# Patient Record
Sex: Female | Born: 1970
Health system: Southern US, Community
[De-identification: ages and names within clinical notes are randomized; demographics above are authoritative.]

## PROBLEM LIST (undated history)

## (undated) DIAGNOSIS — C801 Malignant (primary) neoplasm, unspecified: Secondary | ICD-10-CM

## (undated) DIAGNOSIS — E282 Polycystic ovarian syndrome: Secondary | ICD-10-CM

## (undated) DIAGNOSIS — M797 Fibromyalgia: Secondary | ICD-10-CM

## (undated) DIAGNOSIS — K589 Irritable bowel syndrome without diarrhea: Secondary | ICD-10-CM

## (undated) DIAGNOSIS — M779 Enthesopathy, unspecified: Secondary | ICD-10-CM

## (undated) DIAGNOSIS — F32A Depression, unspecified: Secondary | ICD-10-CM

## (undated) DIAGNOSIS — M1990 Unspecified osteoarthritis, unspecified site: Secondary | ICD-10-CM

## (undated) DIAGNOSIS — I10 Essential (primary) hypertension: Secondary | ICD-10-CM

## (undated) DIAGNOSIS — K219 Gastro-esophageal reflux disease without esophagitis: Secondary | ICD-10-CM

## (undated) DIAGNOSIS — F329 Major depressive disorder, single episode, unspecified: Secondary | ICD-10-CM

## (undated) DIAGNOSIS — F419 Anxiety disorder, unspecified: Secondary | ICD-10-CM

## (undated) DIAGNOSIS — J189 Pneumonia, unspecified organism: Secondary | ICD-10-CM

## (undated) DIAGNOSIS — D126 Benign neoplasm of colon, unspecified: Secondary | ICD-10-CM

## (undated) HISTORY — DX: Depression, unspecified: F32.A

## (undated) HISTORY — DX: Fibromyalgia: M79.7

## (undated) HISTORY — DX: Polycystic ovarian syndrome: E28.2

## (undated) HISTORY — DX: Irritable bowel syndrome, unspecified: K58.9

## (undated) HISTORY — PX: NEUROPLASTY / TRANSPOSITION MEDIAN NERVE AT CARPAL TUNNEL BILATERAL: SUR894

## (undated) HISTORY — DX: Major depressive disorder, single episode, unspecified: F32.9

## (undated) HISTORY — DX: Unspecified osteoarthritis, unspecified site: M19.90

## (undated) HISTORY — DX: Enthesopathy, unspecified: M77.9

## (undated) HISTORY — DX: Anxiety disorder, unspecified: F41.9

## (undated) HISTORY — PX: APPENDECTOMY: SHX54

## (undated) HISTORY — DX: Benign neoplasm of colon, unspecified: D12.6

## (undated) HISTORY — PX: BACK SURGERY: SHX140

---
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
## 1997-04-28 ENCOUNTER — Encounter: Admission: RE | Admit: 1997-04-28 | Discharge: 1997-07-27 | Payer: Self-pay | Admitting: Orthopedic Surgery

## 1997-09-27 ENCOUNTER — Encounter: Admission: RE | Admit: 1997-09-27 | Discharge: 1997-09-27 | Payer: Self-pay | Admitting: Sports Medicine

## 1997-12-12 ENCOUNTER — Encounter: Admission: RE | Admit: 1997-12-12 | Discharge: 1997-12-12 | Payer: Self-pay | Admitting: Family Medicine

## 1998-01-09 ENCOUNTER — Encounter: Admission: RE | Admit: 1998-01-09 | Discharge: 1998-01-09 | Payer: Self-pay | Admitting: Family Medicine

## 1998-02-28 ENCOUNTER — Encounter: Admission: RE | Admit: 1998-02-28 | Discharge: 1998-02-28 | Payer: Self-pay | Admitting: Family Medicine

## 1998-02-28 ENCOUNTER — Other Ambulatory Visit: Admission: RE | Admit: 1998-02-28 | Discharge: 1998-02-28 | Payer: Self-pay | Admitting: Family Medicine

## 1998-03-27 ENCOUNTER — Encounter: Admission: RE | Admit: 1998-03-27 | Discharge: 1998-03-27 | Payer: Self-pay | Admitting: Family Medicine

## 1998-03-31 ENCOUNTER — Encounter: Admission: RE | Admit: 1998-03-31 | Discharge: 1998-03-31 | Payer: Self-pay | Admitting: Family Medicine

## 1998-05-08 ENCOUNTER — Encounter: Admission: RE | Admit: 1998-05-08 | Discharge: 1998-05-08 | Payer: Self-pay | Admitting: Family Medicine

## 1998-05-10 ENCOUNTER — Encounter: Admission: RE | Admit: 1998-05-10 | Discharge: 1998-05-10 | Payer: Self-pay | Admitting: Family Medicine

## 1998-05-12 ENCOUNTER — Encounter: Payer: Self-pay | Admitting: Emergency Medicine

## 1998-05-12 ENCOUNTER — Emergency Department (HOSPITAL_COMMUNITY): Admission: EM | Admit: 1998-05-12 | Discharge: 1998-05-12 | Payer: Self-pay | Admitting: Emergency Medicine

## 1998-05-25 ENCOUNTER — Encounter: Admission: RE | Admit: 1998-05-25 | Discharge: 1998-05-25 | Payer: Self-pay | Admitting: Obstetrics

## 1998-09-29 ENCOUNTER — Encounter (INDEPENDENT_AMBULATORY_CARE_PROVIDER_SITE_OTHER): Payer: Self-pay | Admitting: *Deleted

## 1998-09-29 LAB — CONVERTED CEMR LAB

## 1998-10-09 ENCOUNTER — Encounter: Admission: RE | Admit: 1998-10-09 | Discharge: 1998-10-09 | Payer: Self-pay | Admitting: Family Medicine

## 2001-04-24 ENCOUNTER — Encounter: Admission: RE | Admit: 2001-04-24 | Discharge: 2001-04-24 | Payer: Self-pay | Admitting: Family Medicine

## 2001-04-28 HISTORY — PX: ROTATOR CUFF REPAIR: SHX139

## 2002-09-24 ENCOUNTER — Encounter
Admission: RE | Admit: 2002-09-24 | Discharge: 2002-12-23 | Payer: Self-pay | Admitting: Physical Medicine & Rehabilitation

## 2002-12-27 ENCOUNTER — Encounter
Admission: RE | Admit: 2002-12-27 | Discharge: 2003-03-27 | Payer: Self-pay | Admitting: Physical Medicine & Rehabilitation

## 2003-03-30 ENCOUNTER — Encounter
Admission: RE | Admit: 2003-03-30 | Discharge: 2003-06-28 | Payer: Self-pay | Admitting: Physical Medicine & Rehabilitation

## 2003-08-04 ENCOUNTER — Encounter
Admission: RE | Admit: 2003-08-04 | Discharge: 2003-10-05 | Payer: Self-pay | Admitting: Physical Medicine & Rehabilitation

## 2003-10-05 ENCOUNTER — Encounter
Admission: RE | Admit: 2003-10-05 | Discharge: 2003-12-13 | Payer: Self-pay | Admitting: Physical Medicine & Rehabilitation

## 2003-10-07 ENCOUNTER — Ambulatory Visit: Payer: Self-pay | Admitting: Physical Medicine & Rehabilitation

## 2003-10-14 ENCOUNTER — Ambulatory Visit: Payer: Self-pay | Admitting: Psychology

## 2003-11-18 ENCOUNTER — Ambulatory Visit: Payer: Self-pay | Admitting: Psychology

## 2003-12-13 ENCOUNTER — Encounter
Admission: RE | Admit: 2003-12-13 | Discharge: 2004-02-09 | Payer: Self-pay | Admitting: Physical Medicine & Rehabilitation

## 2004-02-09 ENCOUNTER — Encounter
Admission: RE | Admit: 2004-02-09 | Discharge: 2004-05-09 | Payer: Self-pay | Admitting: Physical Medicine & Rehabilitation

## 2004-03-02 ENCOUNTER — Ambulatory Visit: Payer: Self-pay | Admitting: Physical Medicine & Rehabilitation

## 2004-04-26 ENCOUNTER — Ambulatory Visit: Payer: Self-pay | Admitting: Physical Medicine & Rehabilitation

## 2004-06-19 ENCOUNTER — Encounter
Admission: RE | Admit: 2004-06-19 | Discharge: 2004-09-17 | Payer: Self-pay | Admitting: Physical Medicine & Rehabilitation

## 2004-06-21 ENCOUNTER — Ambulatory Visit: Payer: Self-pay | Admitting: Physical Medicine & Rehabilitation

## 2004-08-08 ENCOUNTER — Ambulatory Visit: Payer: Self-pay | Admitting: Physical Medicine & Rehabilitation

## 2004-10-03 ENCOUNTER — Encounter
Admission: RE | Admit: 2004-10-03 | Discharge: 2005-01-01 | Payer: Self-pay | Admitting: Physical Medicine & Rehabilitation

## 2004-11-08 ENCOUNTER — Ambulatory Visit: Payer: Self-pay | Admitting: Physical Medicine & Rehabilitation

## 2005-01-02 ENCOUNTER — Encounter
Admission: RE | Admit: 2005-01-02 | Discharge: 2005-01-23 | Payer: Self-pay | Admitting: Physical Medicine & Rehabilitation

## 2005-01-11 ENCOUNTER — Ambulatory Visit: Payer: Self-pay | Admitting: Physical Medicine & Rehabilitation

## 2005-03-08 ENCOUNTER — Encounter
Admission: RE | Admit: 2005-03-08 | Discharge: 2005-06-06 | Payer: Self-pay | Admitting: Physical Medicine & Rehabilitation

## 2005-03-08 ENCOUNTER — Ambulatory Visit: Payer: Self-pay | Admitting: Physical Medicine & Rehabilitation

## 2005-05-02 ENCOUNTER — Ambulatory Visit: Payer: Self-pay | Admitting: Physical Medicine & Rehabilitation

## 2005-05-27 ENCOUNTER — Ambulatory Visit (HOSPITAL_COMMUNITY)
Admission: RE | Admit: 2005-05-27 | Discharge: 2005-05-27 | Payer: Self-pay | Admitting: Physical Medicine & Rehabilitation

## 2005-06-27 ENCOUNTER — Encounter
Admission: RE | Admit: 2005-06-27 | Discharge: 2005-09-25 | Payer: Self-pay | Admitting: Physical Medicine & Rehabilitation

## 2005-06-27 ENCOUNTER — Ambulatory Visit: Payer: Self-pay | Admitting: Physical Medicine & Rehabilitation

## 2005-09-24 ENCOUNTER — Ambulatory Visit: Payer: Self-pay | Admitting: Physical Medicine & Rehabilitation

## 2005-09-24 ENCOUNTER — Encounter
Admission: RE | Admit: 2005-09-24 | Discharge: 2005-12-23 | Payer: Self-pay | Admitting: Physical Medicine & Rehabilitation

## 2005-12-13 ENCOUNTER — Ambulatory Visit: Payer: Self-pay | Admitting: Physical Medicine & Rehabilitation

## 2006-01-28 HISTORY — PX: TRIGGER FINGER RELEASE: SHX641

## 2006-03-11 ENCOUNTER — Encounter
Admission: RE | Admit: 2006-03-11 | Discharge: 2006-06-09 | Payer: Self-pay | Admitting: Physical Medicine & Rehabilitation

## 2006-03-13 ENCOUNTER — Ambulatory Visit: Payer: Self-pay | Admitting: Physical Medicine & Rehabilitation

## 2006-03-28 ENCOUNTER — Encounter (INDEPENDENT_AMBULATORY_CARE_PROVIDER_SITE_OTHER): Payer: Self-pay | Admitting: *Deleted

## 2006-06-03 ENCOUNTER — Ambulatory Visit: Payer: Self-pay | Admitting: Physical Medicine & Rehabilitation

## 2006-06-19 ENCOUNTER — Ambulatory Visit (HOSPITAL_COMMUNITY): Admission: RE | Admit: 2006-06-19 | Discharge: 2006-06-19 | Payer: Self-pay | Admitting: Physician Assistant

## 2006-08-27 ENCOUNTER — Ambulatory Visit: Payer: Self-pay | Admitting: Physical Medicine & Rehabilitation

## 2006-08-27 ENCOUNTER — Encounter
Admission: RE | Admit: 2006-08-27 | Discharge: 2006-10-28 | Payer: Self-pay | Admitting: Physical Medicine & Rehabilitation

## 2006-11-26 ENCOUNTER — Encounter
Admission: RE | Admit: 2006-11-26 | Discharge: 2006-11-26 | Payer: Self-pay | Admitting: Physical Medicine & Rehabilitation

## 2006-11-26 ENCOUNTER — Ambulatory Visit: Payer: Self-pay | Admitting: Physical Medicine & Rehabilitation

## 2006-11-27 ENCOUNTER — Encounter
Admission: RE | Admit: 2006-11-27 | Discharge: 2007-02-25 | Payer: Self-pay | Admitting: Physical Medicine & Rehabilitation

## 2007-02-18 ENCOUNTER — Ambulatory Visit: Payer: Self-pay | Admitting: Physical Medicine & Rehabilitation

## 2007-02-18 ENCOUNTER — Encounter
Admission: RE | Admit: 2007-02-18 | Discharge: 2007-02-19 | Payer: Self-pay | Admitting: Physical Medicine & Rehabilitation

## 2007-05-13 ENCOUNTER — Encounter
Admission: RE | Admit: 2007-05-13 | Discharge: 2007-08-11 | Payer: Self-pay | Admitting: Physical Medicine & Rehabilitation

## 2007-05-18 ENCOUNTER — Ambulatory Visit: Payer: Self-pay | Admitting: Physical Medicine & Rehabilitation

## 2007-07-27 ENCOUNTER — Encounter: Payer: Self-pay | Admitting: Gastroenterology

## 2007-08-05 DIAGNOSIS — F450 Somatization disorder: Secondary | ICD-10-CM | POA: Insufficient documentation

## 2007-08-05 DIAGNOSIS — N979 Female infertility, unspecified: Secondary | ICD-10-CM | POA: Insufficient documentation

## 2007-08-05 DIAGNOSIS — G4700 Insomnia, unspecified: Secondary | ICD-10-CM | POA: Insufficient documentation

## 2007-08-05 DIAGNOSIS — IMO0002 Reserved for concepts with insufficient information to code with codable children: Secondary | ICD-10-CM

## 2007-08-05 DIAGNOSIS — IMO0001 Reserved for inherently not codable concepts without codable children: Secondary | ICD-10-CM

## 2007-08-05 DIAGNOSIS — M545 Low back pain, unspecified: Secondary | ICD-10-CM | POA: Insufficient documentation

## 2007-08-05 DIAGNOSIS — M1710 Unilateral primary osteoarthritis, unspecified knee: Secondary | ICD-10-CM

## 2007-08-05 DIAGNOSIS — N943 Premenstrual tension syndrome: Secondary | ICD-10-CM | POA: Insufficient documentation

## 2007-08-05 DIAGNOSIS — N83209 Unspecified ovarian cyst, unspecified side: Secondary | ICD-10-CM

## 2007-08-05 DIAGNOSIS — F419 Anxiety disorder, unspecified: Secondary | ICD-10-CM | POA: Insufficient documentation

## 2007-08-05 DIAGNOSIS — F32A Depression, unspecified: Secondary | ICD-10-CM | POA: Insufficient documentation

## 2007-08-05 DIAGNOSIS — R87619 Unspecified abnormal cytological findings in specimens from cervix uteri: Secondary | ICD-10-CM | POA: Insufficient documentation

## 2007-08-05 DIAGNOSIS — M25569 Pain in unspecified knee: Secondary | ICD-10-CM | POA: Insufficient documentation

## 2007-08-05 DIAGNOSIS — N949 Unspecified condition associated with female genital organs and menstrual cycle: Secondary | ICD-10-CM

## 2007-08-05 DIAGNOSIS — E669 Obesity, unspecified: Secondary | ICD-10-CM | POA: Insufficient documentation

## 2007-08-05 DIAGNOSIS — M1990 Unspecified osteoarthritis, unspecified site: Secondary | ICD-10-CM | POA: Insufficient documentation

## 2007-08-05 DIAGNOSIS — F411 Generalized anxiety disorder: Secondary | ICD-10-CM | POA: Insufficient documentation

## 2007-08-05 DIAGNOSIS — N938 Other specified abnormal uterine and vaginal bleeding: Secondary | ICD-10-CM | POA: Insufficient documentation

## 2007-08-06 ENCOUNTER — Ambulatory Visit: Payer: Self-pay | Admitting: Gastroenterology

## 2007-08-06 ENCOUNTER — Encounter
Admission: RE | Admit: 2007-08-06 | Discharge: 2007-08-07 | Payer: Self-pay | Admitting: Physical Medicine & Rehabilitation

## 2007-08-06 DIAGNOSIS — R1011 Right upper quadrant pain: Secondary | ICD-10-CM | POA: Insufficient documentation

## 2007-08-06 DIAGNOSIS — K589 Irritable bowel syndrome without diarrhea: Secondary | ICD-10-CM

## 2007-08-06 DIAGNOSIS — K219 Gastro-esophageal reflux disease without esophagitis: Secondary | ICD-10-CM | POA: Insufficient documentation

## 2007-08-06 LAB — CONVERTED CEMR LAB
Iron: 72 ug/dL (ref 42–145)
Vitamin B-12: 244 pg/mL (ref 211–911)

## 2007-08-07 ENCOUNTER — Ambulatory Visit (HOSPITAL_COMMUNITY): Admission: RE | Admit: 2007-08-07 | Discharge: 2007-08-07 | Payer: Self-pay | Admitting: Gastroenterology

## 2007-08-07 ENCOUNTER — Ambulatory Visit: Payer: Self-pay | Admitting: Physical Medicine & Rehabilitation

## 2007-08-19 ENCOUNTER — Encounter: Payer: Self-pay | Admitting: Gastroenterology

## 2007-08-19 ENCOUNTER — Ambulatory Visit: Payer: Self-pay | Admitting: Gastroenterology

## 2007-08-19 ENCOUNTER — Telehealth: Payer: Self-pay | Admitting: Gastroenterology

## 2007-08-29 ENCOUNTER — Encounter: Payer: Self-pay | Admitting: Gastroenterology

## 2007-10-29 ENCOUNTER — Encounter
Admission: RE | Admit: 2007-10-29 | Discharge: 2007-10-30 | Payer: Self-pay | Admitting: Physical Medicine & Rehabilitation

## 2007-10-30 ENCOUNTER — Ambulatory Visit: Payer: Self-pay | Admitting: Physical Medicine & Rehabilitation

## 2007-11-22 ENCOUNTER — Emergency Department (HOSPITAL_COMMUNITY): Admission: EM | Admit: 2007-11-22 | Discharge: 2007-11-22 | Payer: Self-pay | Admitting: Emergency Medicine

## 2008-01-29 HISTORY — PX: BACK SURGERY: SHX140

## 2008-02-18 ENCOUNTER — Encounter
Admission: RE | Admit: 2008-02-18 | Discharge: 2008-02-19 | Payer: Self-pay | Admitting: Physical Medicine & Rehabilitation

## 2008-02-19 ENCOUNTER — Ambulatory Visit: Payer: Self-pay | Admitting: Physical Medicine & Rehabilitation

## 2008-03-18 ENCOUNTER — Encounter: Admission: RE | Admit: 2008-03-18 | Discharge: 2008-03-22 | Payer: Self-pay | Admitting: Anesthesiology

## 2008-03-22 ENCOUNTER — Ambulatory Visit: Payer: Self-pay | Admitting: Anesthesiology

## 2008-03-22 ENCOUNTER — Ambulatory Visit (HOSPITAL_COMMUNITY)
Admission: RE | Admit: 2008-03-22 | Discharge: 2008-03-22 | Payer: Self-pay | Admitting: Physical Medicine & Rehabilitation

## 2008-07-05 ENCOUNTER — Encounter: Admission: RE | Admit: 2008-07-05 | Discharge: 2008-07-05 | Payer: Self-pay | Admitting: Orthopedic Surgery

## 2008-07-19 ENCOUNTER — Encounter: Admission: RE | Admit: 2008-07-19 | Discharge: 2008-07-19 | Payer: Self-pay | Admitting: Orthopedic Surgery

## 2008-08-31 ENCOUNTER — Encounter: Admission: RE | Admit: 2008-08-31 | Discharge: 2008-08-31 | Payer: Self-pay | Admitting: Orthopedic Surgery

## 2008-11-10 ENCOUNTER — Inpatient Hospital Stay (HOSPITAL_COMMUNITY): Admission: RE | Admit: 2008-11-10 | Discharge: 2008-11-14 | Payer: Self-pay | Admitting: Neurological Surgery

## 2009-02-20 ENCOUNTER — Ambulatory Visit (HOSPITAL_COMMUNITY): Admission: RE | Admit: 2009-02-20 | Discharge: 2009-02-20 | Payer: Self-pay | Admitting: Family Medicine

## 2010-02-09 ENCOUNTER — Encounter
Admission: RE | Admit: 2010-02-09 | Discharge: 2010-02-09 | Payer: Self-pay | Source: Home / Self Care | Attending: Orthopaedic Surgery | Admitting: Orthopaedic Surgery

## 2010-02-18 ENCOUNTER — Encounter: Payer: Self-pay | Admitting: Physical Medicine & Rehabilitation

## 2010-03-26 ENCOUNTER — Other Ambulatory Visit: Payer: Self-pay | Admitting: Orthopaedic Surgery

## 2010-03-26 DIAGNOSIS — M25511 Pain in right shoulder: Secondary | ICD-10-CM

## 2010-03-31 ENCOUNTER — Other Ambulatory Visit: Payer: Self-pay

## 2010-05-03 LAB — ABO/RH: ABO/RH(D): O POS

## 2010-05-03 LAB — CBC
HCT: 34.6 % — ABNORMAL LOW (ref 36.0–46.0)
HCT: 39.9 % (ref 36.0–46.0)
MCHC: 34.7 g/dL (ref 30.0–36.0)
MCV: 89.3 fL (ref 78.0–100.0)
Platelets: 270 10*3/uL (ref 150–400)
Platelets: 334 10*3/uL (ref 150–400)
RBC: 3.84 MIL/uL — ABNORMAL LOW (ref 3.87–5.11)
RBC: 4.47 MIL/uL (ref 3.87–5.11)

## 2010-05-03 LAB — BASIC METABOLIC PANEL
BUN: 3 mg/dL — ABNORMAL LOW (ref 6–23)
CO2: 27 mEq/L (ref 19–32)
GFR calc Af Amer: >60 mL/min (ref >60)
Sodium: 135 mEq/L (ref 135–145)

## 2010-05-03 LAB — TYPE AND SCREEN: Antibody Screen: NEGATIVE

## 2010-06-12 NOTE — Assessment & Plan Note (Signed)
Ms. Ramdass returns to clinic today for followup evaluation.  She  reports that overall she is having more pain with the colder weather  lately.  She has been told that she has arthritis of the TMJ joint.  She  does request an elbow injection on the right side.  She last had an  injection in February of 2008 and that helped.  She does report  occasionally dropping things, but does also have a history of bilateral  carpal tunnel syndrome with surgery.  She does need a refill on her  oxycodone in the office today.  She continues to get a fair amount of  relief with that used approximately 5-6 tablets per day.   MEDICATIONS:  1. Celebrex 200 mg daily.  2. Cymbalta 120 mg daily.  3. Soma 350 mg b.i.d.  4. Prilosec p.r.n.  5. Flonase p.r.n.  6. Oxycodone 5 mg 1 __________ tablets p.o. t.i.d. p.r.n. (5-6 per      day).  7. Bentyl daily.   REVIEW OF SYSTEMS:  Noncontributory.   PHYSICAL EXAMINATION:  Markedly overweight adult female in mild acute  discomfort.  Blood pressure 123/78 with a pulse of 88, respiratory rate 18, and O2  saturation 99% on room air.  She has 4+/5 strength throughout the bilateral upper and lower  extremities.   IMPRESSION:  1. Probable fibromyalgia.  2. Chronic low back pain with bilateral knee pain.  3. Bilateral knee osteoarthritis.  4. Mild degenerative disk disease of the lumbar spine.  5. Mild degenerative disease of the right ankle.   In the office today we did refill the patient's oxycodone as noted  above.  We also had her sign a consent for right elbow injection.  I  prepared 3 cc syringe with 1 cc of Kenalog 40 and 2 cc of lidocaine 1%.  I cleansed the right elbow and injected posterolaterally approximately 1  cm deep.  The patient tolerated the procedure well and good hemostasis  was obtained at the injection site.   We will plan on seeing the patient in followup in approximately 3  months' time with refills prior to that appointment as  necessary.  I  also gave her samples of ketorolac gel 1% to be applied to the right  temporomandibular joint region to see if we can help with some of the  arthritic pain that she has in that joint.  She already takes Celebrex,  and we are avoiding any other antiinflammatory medications orally.  Will  plan on seeing the patient in followup as noted above.           ______________________________  Ellwood Dense, M.D.     DC/MedQ  D:  11/27/2006 10:30:43  T:  11/27/2006 17:08:06  Job #:  [id number]

## 2010-06-12 NOTE — Assessment & Plan Note (Signed)
[name] returns to the clinic today for followup evaluation.  She  reports that she did get good relief from the Voltaren gel that she  applied to her foot and to her TMJ joint bilaterally.  She would like to  have samples of those as she cannot afford them, and they are not  covered by her insurance.  Furthermore lidocaine patches are not covered  by her insurance, and she gained relief with those when she applied them  to her foot in a path.  She does have increased pain in her right SI  joint region.  She wonders about the lidocaine patch to that area.  She  continues to plan to quit tobacco, but still smokes 1/2 pack per day.  The PA at her primary care physician's office has started her on  Wellbutrin in addition to her Cymbalta.  She does need a refill on her  oxycodone and Soma in the office today.   MEDICATIONS:  1. Celebrex 200 mg daily.  2. Cymbalta 120 mg daily.  3. Soma 350 mg b.i.d.  4. Prilosec p.r.n.  5. Flonase p.r.n.  6. Oxycodone 5 mg 1-2 tablets p.o. t.i.d. p.r.n. (5-6 per day).  7. Bentyl daily.  8. Wellbutrin daily.   REVIEW OF SYSTEMS:  Positive for weight gain, night sweats,  constipation, diarrhea, abdominal pain.   PHYSICAL EXAMINATION:  GENERAL:  Overweight adult female in mild to  moderate acute discomfort.  VITAL SIGNS:  Blood pressure 124/49 with pulse 100, respiratory rate 20,  O2 saturation 99% on room air.  MUSCULOSKELETAL:  She has 4+/5 strength throughout the bilateral upper  and lower extremities.  She does complain of pain in her right SI joint  region along with milder pain in her right groin region.   IMPRESSION:  1. Probable fibromyalgia.  2. Chronic low back and bilateral knee pain.  3. Bilateral knee osteoarthritis.  4. Mild degenerative disk disease of the lumbar spine.  5. Mild degenerative disease of the right ankle.   In the office today we did refill the patient's Soma and oxycodone.  We  also gave her samples of lidocaine  patches to be applied to right SI  joint and also samples of Voltaren gel 1% to be applied to her TMJ  joints and her right ankle.  Will plan on seeing the patient in followup  in this office in approximately three months' time with refills prior to  that appointment if necessary.           ______________________________  Ellwood Dense, M.D.     DC/MedQ  D:  02/19/2007 11:43:54  T:  02/19/2007 12:33:48  Job #:  [id number]

## 2010-06-12 NOTE — Assessment & Plan Note (Signed)
HISTORY OF PRESENT ILLNESS:  Ms. Kruger returns for follow-up  evaluation.  She has numerous questions in the office today.  Some of  those are related to her recent evaluation with Dr. Stanford Scotland, her  orthopedist in [facility].  She had x-rays of her right ankle done which  showed an enlarged tuberosity of the tarsal navicular area.  It was felt  that there were some degenerative changes in that area.  Dr. Stanford Scotland  suggested the patient increase her Celebrex up to daily.  She does need  a refill on that along with the Soma in the office today.  She also  needs a refill on her oxycodone over the next few days.  She reports  that Dr. Stanford Scotland wanted to have her obtain orthotics costing  approximately $317 through their office.  She reports that she could not  afford that.  She plans to try over-the-counter orthotics through her  local pharmacy.  We did suggest the use of Lidoderm patches and gave her  samples in the office today.  She does report night sweats and dry mouth  and would like to try the lidocaine patch before we add any extra  medication for her at this time.   MEDICATIONS:  1. Celebrex 200 mg 1 tablet daily.  2. Cymbalta 120 mg daily.  3. Soma 350 mg q.h.s.  4. Prilosec p.r.n.  5. Flonase p.r.n.  6. Oxycodone 5 mg 1-2 tablets p.o. t.i.d. p.r.n. (5 to 6 per day.)   REVIEW OF SYSTEMS:  Positive for night sweats, weight loss,  constipation, diarrhea, vomiting, nausea, abdominal pain, limb swelling.   PHYSICAL EXAMINATION:  GENERAL APPEARANCE:  Well-appearing, moderately  overweight, adult female in mild acute discomfort.  VITAL SIGNS:  Blood pressure 132/73, pulse 81, respiratory rate 16 and  O2 saturation 99% on room air.  EXTREMITIES:  She has pain involving the middle aspect of her right foot  in the medial area.   IMPRESSION:  1. Probable fibromyalgia.  2. Chronic low back pain with bilateral knee pain.  3. Bilateral knee osteoarthritis.  4. Mild degenerative  disk disease of the lumbar spine.  5. Mild degenerative disease of the right ankle.   PLAN:  In the office today, we did refill the patient's Soma along with  her Celebrex and changed that to daily dosing.  We also gave her samples  of Lidoderm patches in the office today to be used on her right ankle  q.h.s. through q.a.m.  We refilled the oxycodone as of Jun 07, 2006.  Will plan on seeing her in follow up in approximately two to three  months with refills prior to that appointment as necessary.           ______________________________  Ellwood Dense, M.D.     DC/MedQ  D:  06/05/2006 12:33:37  T:  06/05/2006 14:05:53  Job #:  [id number]

## 2010-06-12 NOTE — Assessment & Plan Note (Signed)
SUBJECTIVE:  Erica Mcdaniel is seen in the clinic today for followup.  She  did not come in for her refills as allowed.  She apparently is having  problems affording the gas and coming in from the distance that she  lives away.  She does need refills on her Celebrex and Oxycodone.  She  has been using her Tresa Garter generally twice a day and getting better relief  than when she was using it once a day.  She does report that the  Lidoderm patches have helped with her right ankle pain.  That has  improved.  She has lost 15 pounds and is down to 242 doing a lot of  swimming and walking.  The patient does report that off the pain  medication she notices substantially increased pain and now is convinced  that she has to have the medications as prescribed.   MEDICATIONS:  1. Celebrex 200 mg daily.  2. Cymbalta 120 mg daily.  3. Soma 350 mg b.i.d.  4. Prilosec p.r.n.  5. Flonase p.r.n.  6. Oxycodone 5 mg 1-2 tablets p.o. t.i.d. p.r.n. (5-6 per day).   REVIEW OF SYSTEMS:  Positive for weight loss, night sweats, diarrhea,  constipation, abdominal pain, nausea.   PHYSICAL EXAMINATION:  GENERAL:  Well-appearing, moderately overweight  adult female in moderate acute discomfort.  VITAL SIGNS:  Blood pressure was 147/81 with pulse 97, respiratory rate  18, O2 saturation 99% on room air.  NEUROLOGICAL:  She has 4+/5 strength throughout the bilateral upper and  lower extremities.   IMPRESSION:  1. Probable fibromyalgia.  2. Chronic low back pain with bilateral knee pain.  3. Bilateral knee osteoarthritis.  4. Mild degenerative disk disease of the lumbar spine.  5. Mild degenerative disease of the right ankle.   PLAN:  In the office today, we did refill the patient's Celebrex and  Oxycodone as of today.  We also refilled her Somat at the b.i.d. dosing  as of today.  We will plan on seeing her in followup in approximately  three months time.  She will have her father come in to pick up scripts  for  her as he is able to afford the gas for transportation at this time.           ______________________________  Ellwood Dense, M.D.     DC/MedQ  D:  08/28/2006 10:48:39  T:  08/28/2006 17:05:29  Job #:  [id number]

## 2010-06-12 NOTE — Assessment & Plan Note (Signed)
Erica Mcdaniel returns to the clinic today for followup evaluation.  She  did undergo colonoscopy and endoscopy on August 19, 2007 a by [facility] GI  physician.  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  She reports that she was  negative for celiac disease.  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  She was placed on MiraLax twice a  day, which is only minimally helpful.  She is also started on Align,  which is apparently a probiotic.   The patient was also referred to Dr. Corliss Skains, local rheumatologist.  They advised her to change her diet slightly and did a lot of blood  work, which she is due to follow up for on November 19, 2007.  She was  also told that she has fatty liver and physicians have suggested  possibly gastric bypass.  Overall without surgery, she has lost 7  pounds.  She does take the oxycodone approximately 5-6 tablets per day  and does need a refill.  She feels that she gets benefit from the [place],  which she feels helps with spasms of her intestines.  She also has the  new brace for left foot and is getting shoe inserts for heel spurs.  She  requests samples of Lidoderm patch.   MEDICATIONS:  1. Celebrex 200 mg daily.  2. Cymbalta 120 mg daily.  3. Soma 350 mg t.i.d. p.r.n.  4. Prilosec p.r.n.  5. Flonase p.r.n.  6. Align daily.  7. Oxycodone 5 mg 1-2 tablets t.i.d. p.r.n. (5-6 per day).  8. Bentyl daily.  9. Wellbutrin daily.  10.Voltaren gel p.r.n.  11.Lidocaine patches 5% daily p.r.n.Marland Kitchen   REVIEW OF SYSTEMS:  Positive for nausea, vomit, diarrhea, colonoscopy,  and abdominal pain.   PHYSICAL EXAMINATION:  GENERAL:  Moderately over weight adult female in  mild-to-no acute discomfort.  VITAL SIGNS:  Blood pressure 133/89 with pulse of 74, respiratory rate  18, and O2 saturation 99% on room air.  EXTREMITIES:  She has 4+/5 strength throughout the bilateral upper and  lower  extremities.  She ambulates without any assistive device.   IMPRESSION:  1. Probable fibromyalgia.  2. Chronic low back and bilateral knee pain.  3. Bilateral knee osteoarthritis.  4. Probable degenerative joint disease of the medial malleolus.  5. Mild degenerative disk disease of the lumbar spine.  6. Chronic abdominal pain.   In the office today, we did refill the patient's oxycodone as of November 07, 2007.  She has a sufficient supply of Soma and Celebrex at this  time.  Also gave her samples of lidocaine patches in the office today  5%.  We will plan on seeing her in followup in approximately 3 months'  time with refills prior to that appointment as necessary.           ______________________________  Ellwood Dense, M.D.     DC/MedQ  D:  10/30/2007 10:49:11  T:  10/30/2007 23:21:04  Job #:  [id number]

## 2010-06-12 NOTE — Assessment & Plan Note (Signed)
Ms. Whitelaw returns to clinic today for followup evaluation.  She  reports that she continues to get good benefit from the Voltaren gel  applied to her right foot in the TMJ joint.  She does use it bilaterally  but reports more pain on the right side.  She has an ankle-foot orthosis  prescribed by her orthopedist Dr. Stanford Scotland, but she is asking about  another brace.  She has apparently been told that she has degenerative  joint disease in the medial malleolus of the right side.  That will  probably become more and more symptomatic with prolonged weightbearing.  Patient does report that she is in the process of trying to stop  smoking.  She is on Wellbutrin and is down to 10 cigarettes per day.  Despite smoking cessation efforts, she has had ability to lose 7 pounds  and tries to walk on a regular basis.  She does need a refill on her  oxycodone and her Tresa Garter in the office today.  She also asked for repeat  samples of the Voltaren gel.   MEDICATIONS:  1. Celebrex 200 mg q. day.  2. Cymbalta 120 mg q. day.  3. Soma 350 mg b.i.d.  4. Prilosec p.r.n.  5. Flonase p.r.n.  6. Oxycodone 5 mg 1-2 tablets t.i.d. p.r.n. (5-6 per day).  7. Bentyl daily.  8. Wellbutrin daily.  9. Voltaren gel p.r.n.   REVIEW OF SYSTEMS:  Positive for weight loss, night sweats, nausea,  vomiting, diarrhea, constipation, abdominal pain, and coughing.   PHYSICAL EXAMINATION:  Mildly to moderately overweight adult female in  mild acute discomfort.  Blood pressure 146/86 with a pulse of 84, respiratory rate 18 and O2  saturation 97% on room air.  The patient has 4+/5 strength throughout the bilateral upper and lower  extremities.  She does complain of pain in the medial malleolus,  especially of her right ankle.   IMPRESSION:  1. Probable fibromyalgia.  2. Chronic low back and bilateral knee pain.  3. Bilateral knee osteoarthritis.  4. Probably degenerative joint disease of the medial malleolus.  5. Mild  degenerative disk disease of the lumbar spine.   In the office today, we did contact [employer] and we have set the patient  up to receive an air-filled stirrup brace for her ankle.  She can go  over there and get that supplied today.  We also refilled her Tresa Garter and  her oxycodone as of the 25th of April and gave her samples of the  Voltaren gel in the office today.  We will plan on seeing her in  followup in approximately 3 months' time.           ______________________________  Ellwood Dense, M.D.     DC/MedQ  D:  05/18/2007 10:57:27  T:  05/18/2007 11:55:59  Job #:  [id number]

## 2010-06-12 NOTE — Assessment & Plan Note (Signed)
Erica Mcdaniel returns to clinic today for followup evaluation.  I last  saw her in this office October 30, 2007.  We have tried to get her in  twice for followup visits for her acute increased shoulder pain and hip  pain.  Unfortunately, she was unable to keep either of those  appointments, one because of sickness on her part with respiratory  infection and the other time because of weather difficulties.   The patient reports that she developed severe bruising and pain of her  left shoulder approximately mid October 2009.  She reports that she  initially was seen in the emergency room and was given a prescription  for pain medicines, but she did not take those medicines.  She reports  that x-rays were done along with blood draws and they released her.  She  subsequently saw Dr. Anna Genre at [facility] who was her family  physician.  They did an ultrasound, November 24, 2007, and did not find  any blood clots.  She subsequently saw Dr. Stanford Scotland, orthopedist at  [facility].  They eventually did an MRI scan of her left shoulder, which  reportedly showed tearing of the deltoid muscle.  She denies any  specific trauma.  They had no surgical intervention that they  recommended and felt that it would improve with time.  She reports that  since that time the pain in the shoulder has actually worsened but the  bruising has improved.  She has not been referred for any therapies and  has been prescribed a sling, but no exercise program for her shoulder.   A separate issue is involving her left hip where she complains of pain  from the posterior to the anterior surface of her left buttock.  She  reports that she has had plain films of her right hip in the past but  never of her left hip.  She does remember a time when she slept on ice  recently, but did not actually fall, but did jolt her when she nearly  fell.   The patient reports that she is not getting much relief from the Soma at  the  present time.  She does get reasonable relief from the oxycodone 5  mg, but is actually increased up to 8 per day at present.   REVIEW OF SYSTEMS:  Positive for weight loss, night sweats, diarrhea,  abdominal pain, and constipation.   MEDICATIONS:  1. Celebrex 200 mg daily.  2. Cymbalta 120 mg daily.  3. Soma 350 mg t.i.d. p.r.n.  4. Prilosec p.r.n.  5. Flonase p.r.n.  6. Oxycodone 5 mg 1-2 tablets p.o. t.i.d. p.r.n. (7-8 per day).  7. Bentyl daily.  8. Wellbutrin daily.  9. Voltaren gel p.r.n.  10.Lidocaine patches p.r.n.   The patient reports the pain is interfering with sleeping, standing,  walking, lifting, and traveling along with household management.   PHYSICAL EXAMINATION:  Moderately overweight adult female in mild  discomfort involving her left hip and left shoulder.  She does have  minimal bruising on the left shoulder with slightly prominent deltoid  vasculature.  She ambulates without any assistive device, but does  complain of pain involving her left hip.  Blood pressure is 140/90 with  pulse of 95, respiratory rate 18, and O2 saturation is 99% on room air.  She generally has 4+5-/5 strength throughout.   IMPRESSION:  1. Probable fibromyalgia.  2. Chronic low back and bilateral knee pain.  3. Bilateral knee osteoarthritis.  4.  Acute left shoulder pain.  5. Acute left hip pain.  6. Chronic abdominal pain.   In the office today, we did send the patient to outpatient therapies at  [facility] to be instructed in home exercise program for the left  shoulder.  We also requested 2-views of her left hip to evaluate for  acute pain and rule out occult fracture.  We substituted Robaxin for the  Soma and we will use the Robaxin at 500 mg t.i.d. p.r.n. and stop the  Soma completely.  We increased her oxycodone up to maximum of 8 tablets  per day from prior 6 tablets per day as of February 25, 2008.  We will  plan on seeing her in followup in approximately 1 month's  time.           ______________________________  Ellwood Dense, M.D.     DC/MedQ  D:  02/19/2008 11:04:04  T:  02/19/2008 23:33:39  Job #:  [id number]

## 2010-06-12 NOTE — Assessment & Plan Note (Signed)
Erica Mcdaniel returns to clinic today for followup evaluation.  She  reports that she has been experiencing a lot of abdominal pain and  aches.  She has been told she may have irritable bowel syndrome possibly  reflux disease with possible gallbladder disease and/or liver disease.  She does report a recent blood test was positive for celiac disease.  She reports that Dr. Jarold Mcdaniel, her GI doctor is planning an endoscopy  and colonoscopy, August 19, 2007.  She reports that other blood work was  recently done for vitamin levels.   In terms of her pain medicines, she is using Celebrex 200 mg daily along  with Soma generally 3 times per day at present.  She also uses oxycodone  anywhere from 5-6 tablets per day.  Her other medicines have been  unchanged.  She does need refills on her pain medicines in the office  today.   MEDICATIONS:  1. Celebrex 200 mg daily.  2. Cymbalta 120 mg daily.  3. Soma 350 mg t.i.d.  4. Prilosec p.r.n.  5. Flonase p.r.n.  6. Oxycodone 5 mg 1-2 tablets t.i.d. p.r.n. (5-6 per day).  7. Bentyl daily.  8. Wellbutrin daily.  9. Voltaren Gel p.r.n.   REVIEW OF SYSTEMS:  Positive for weight loss, skin rash, nausea,  vomiting, diarrhea, constipation, abdominal pain, and limb swelling.   PHYSICAL EXAMINATION:  GENERAL:  Moderately overweight adult female in  moderate-acute discomfort involving the right upper abdomen.  VITAL SIGNS:  Blood pressure is 137/80 with a pulse of 60, and O2  saturation 99% on room air.  She has 4+/5 strength throughout the  bilateral upper and lower extremities.   IMPRESSION:  1. Probable fibromyalgia.  2. Chronic low back and bilateral knee pain.  3. Bilateral knee osteoarthritis.  4. Probable degenerative joint disease of the medial malleolus.  5. Mild degenerative disk disease of the lumbar spine.  6. Chronic abdominal pain.   In the office today, we did refill the patients' oxycodone along with  her Celebrex, and her Soma.  We  gave her refills on Celebrex and Soma.  I will wait to see the results of the endoscopy and colonoscopy, but I  would not be surprised if she has some gallbladder disease according to  her reports about symptoms being worse with fatty foods.  We will plan  on seeing her in follow up at approximately 3 months' time with refills  prior to that appointment as necessary.           ______________________________  Ellwood Dense, M.D.     DC/MedQ  D:  08/07/2007 10:59:01  T:  08/08/2007 09:26:03  Job #:  [id number]

## 2010-06-12 NOTE — Assessment & Plan Note (Signed)
PATIENT:  Erica Mcdaniel.   DATE OF BIRTH:  25-Jul-1970.   SURGEON:  Jewel Baize. Stevphen Rochester, MD   Erica [facility]cd[facility] and I  evaluated her.  I have reviewed the Health and History form.  I reviewed  the 14-point review of systems.   1. Reviewed progress to date, overall directed care approach.  Spent a      considerable period of time with background checks, when reviewing      the med profile, of note, there is a gap with the central check and      we are going to have to run down the individual pharmacy basically      from February to August.  She was, however, consistent throughout      at 180 tablets 30 days supply.  2. Full informed consent, UDS.  3. Discussed modifiable features in health profile, reviewed      medication profile, and reviewed legitimate need.  She has      extensive lumbar sacral disease and fibromyalgia, and is taking      oxycodone, up to 7 or 8 a day.  I counseled.  I am also concerned      about potential for acetaminophen exposure, and reviewed with her.      She will also maintain contact with primary care.  The patient was      instructed to bring her pill bottles in and on each visit, and      review pollices of the pain center here.  We also reviewed      adjuncts.   During the course of the interview, it became apparent that Erica Mcdaniel  regularly used illegal drugs, particularly marijuana, and she felt that  was okay as it was apparently validated either by her treating  physician, or by another.  This is unclear.  When I reviewed the opioid  consent and patient care agreement, I relate to her, that I am unable to  prescribe pain medication, or proceed further in light of this  information.  I offered detoxification, pretty much just starting over.  She had some fibromyalgia symptoms, and she has some pain that we would  consider from a degenerative source, but her underlying diagnoses have  been  probable fibromyalgia, osteoarthritis, and back and hip pain.  I  actually been reviewed some films today of the hip and it looks pretty  normal, with modest L5-S1 disease.  It is my impression that if we just  start over, at baseline, see what her to do her baseline pain is,  possibly offer interventional approach as another option as an adjunct,  we could probably move away from these regular prescriptions of fairly  high-dosed oxycodone.   She then relates to me that it is unfair and she is being singled out,  when in fact that is not the case.  We have been regularly addressing  illicit drug use in this clinic, and followup policy, and we did a peer  check today, confirmed.  We also note that the patient was told this  with her best interest in mind, but did not want to hear other options  available to her.  We would be happy to refer her on in the like, but we  never really got that far.  She got up and left.  She prior to that  noted that there is a note on the wall that we have  the option of drug  screen, and she told me that I could exercise that option and just not  do it.  She stated that she felt like a drug addict, and implicated  nursing and not to me at face, but I reassure her that ongoing adherence  monitoring is a necessary part of controlled substance management and  pain clinic procedure.   She leaves, only to return and is disruptive in the waiting room, and we  took her back to the [employer] office.  At least 45 minutes of  counseling and reassurance to the patient, with options given, and we  again have a peer meeting.  Dr. Wynn Banker, director of the clinic, and  myself agree that we are unable to continue treating her in light of the  fact that her regular use of illegal drugs, but we would give her the  opportunity for detox and then rethink if compliance could be obtained.  We want to give her the opportunity to continue treatment in some area,  and this will be  the policy here.   OBJECTIVE:  I examined her to find mostly myofascial mechanical pain,  suprascapular, levator scapular, and paralumbar discomfort, some  distractibility.  She had intact neurological assessment, her joints  appear to be normal, but some pain with extension and side bending.  Mixed spondylitic myofascial pain.   IMPRESSION:  Myofascial pain.  Mild degenerative disease of the lower  lumbar spine.   PROCEDURE:   PLAN:  We will refer on to detox if she would like, to help with moving  away from oxycodone, moving away from illegal drugs, and look forward to  other treatment options, and have her consider other treatment options,  at this time we cannot offer in this office.  We would be happy to refer  on.            ______________________________  Celene Kras, MD     HH/MedQ  D:  03/22/2008 16:03:16  T:  03/23/2008 03:27:58  Job #:  [id number]

## 2010-06-15 NOTE — Assessment & Plan Note (Signed)
MEDICAL RECORD NUMBER:  [id number].   [name] returns to clinic today for followup evaluation. She reports that  she has been treated for pneumonia for approximately four weeks now. She has  also developed upper right abdominal pain which apparently is being worked  up as possible gallbladder disease. She had an ultrasound which reported  showed her gallbladder to be normal, but she is in the process of being sent  for CAT scan of her abdomen with contrast to evaluate her gallbladder.  Apparently, some gallbladder or liver function tests were slightly elevated  when they drawn by her primary care physician. She also has reportedly some  elevated cardiac enzymes, but she is not sure exactly what they are.  Apparently, her primary care physician, Dr. Anna Genre, plans to forward those  to this office. He is associated with [facility]. She has also  recently had a cholesterol level come back 208.   The patient has a fair amount of stress in her life as two stepsons has  recently moved in with her and her husband along with their 47-year-old  child. She is also followed by  _____________ mental health and was recently  switched from Effexor to Cymbalta at 30 mg for the past week. She was also  started on Ativan at a low dose twice a day as needed.   The patient does need a refill on her Celebrex and oxycodone medications in  the office today.   MEDICATIONS:  1.  Celebrex 200 mg 1 tablet every other day.  2.  Cymbalta 30 mg q.d.  3.  Soma 350 mg 1 tablet q.8h. p.r.n.  4.  Prilosec p.r.n.  5.  Flonase p.r.n.  6.  Oxycodone 5 mg 1 to 2 tablets b.i.d. p.r.n. (approximately 4 per day).   PHYSICAL EXAMINATION:  Reasonably well appearing adult female complaining of  pain of her right upper abdomen. She reports that this pain is worse  whenever she tries to eat much. Blood pressure was 130/77 with a pulse of  82, respiratory rate 16, and O2 saturation of 98% on room air. She has 5/5   strength throughout the bilateral upper and lower extremities. Bulk and tone  were normal. Reflexes were 2+ and symmetrical. There was no rebound  tenderness although there was pain with mild palpation of the right upper  quadrant.   IMPRESSION:  1.  Chronic back pain and bilateral knee pain with a history of multiple      joint surgeries.  2.  Osteoarthritis of the bilateral knees.  3.  Mild degenerative disk disease of the lumbar spine.   In the office today, we did refill her oxycodone and Celebrex medications.  Her primary care physician, Dr. Anna Genre, apparently will be forwarding [place] lab  tests from this patient. She is also scheduled for the CAT scan with  contrast to evaluate her gallbladder which sounds like it has some disease  present. Will plan on seeing the patient in followup in this office in  approximately one to two months' time with refills of medication prior to  that appointment as necessary. If there are any significant abnormalities on  the blood test once they are sent to me, I will call the patient, and we  will make adjustments in medicines as necessary.    DC/MedQ  D:  04/26/2004 11:20:38  T:  04/26/2004 12:55:22  Job #:  [id number]

## 2010-06-15 NOTE — Assessment & Plan Note (Signed)
HISTORY OF PRESENT ILLNESS:  Mrs.  Erica Mcdaniel returns to the clinic today for  follow up evaluation.  She has missed at least one, if not two, appointments  secondary to her child being ill.  We did bridge her on the oxycodone, but  unfortunately did not bridge her on the Duragesic patch.  She does have some  increased pain as she has not had any of the Duragesic patch for the past  few days.  We need to refill both of those in the office today.  She has  lost 7 pounds since last clinic visit.  She has quit drinking [facility]  and has tried to do extra walking on a daily basis.   MEDICATIONS:  1.  Celebrex 200 mg q.i.d.  2.  Cymbalta 30 mg daily.  3.  Soma 350 mg nightly.  4.  Prilosec p.r.n.  5.  Flonase p.r.n.  6.  Oxycodone 5 mg 1-2 tablets p.o. b.i.d. p.r.n. (2-4 per day).  7.  Duragesic patch 25 mcg per hour, change q.72 h.   REVIEW OF SYSTEMS:  Noncontributory.   PHYSICAL EXAMINATION:  GENERAL APPEARANCE:  Reasonably well appearing,  slightly overweight adult female in mild acute discomfort.  VITAL SIGNS:  Blood pressure and vitals not obtained.  NEUROLOGICAL:  She has 5+/5 strength throughout the bilateral upper and  lower extremities.  She has slight pain deep in her right pelvis region.  She does have a history of MRI scan showing polycystic ovarian disease, and  that is followed by her OB/GYN.  No abnormalities at the hip joint were  identified during that study.   IMPRESSION:  1.  Chronic low back pain with bilateral knee pain and history of multiple      joint surgeries.  2.  Bilateral knee osteoarthritis.  3.  Mild degenerative disk disease of the lumbar spine.  4.  Probable fibromyalgia.   PLAN:  In the office today, we did refill the patient's oxycodone and  Duragesic as noted above.  We will plan on seeing the patient in follow up  in this office in approximately two months time with refill on medication  prior to that appointment as necessary.            ______________________________  Ellwood Dense, M.D.     DC/MedQ  D:  01/11/2005 09:58:12  T:  01/11/2005 13:47:04  Job #:  [id number]

## 2010-06-15 NOTE — Assessment & Plan Note (Signed)
HISTORY OF PRESENT ILLNESS:  [name] returns to the clinic today for  follow up evaluation.  She is very happy that she is weighing 207 pounds at  present.  She had been up to 242 pounds and has lost approximately 38  pounds.  She has been doing some pool exercises when she takes her son to  the pool and also has been trying to watch her weight lately.  She has  noticed that her blood pressure is under better control and that she is  feeling some increased energy.  The patient continues to use her Oxycodone 5  mg one tablet q.8h.  She has recently switched to 2 tablets twice a day, and  that seems to help her more than one tablet q.8h.  She continues to take 1/2  tablet of Robaxin on a three time a day basis.  She also uses her Celebrex  every other day at the present time.  The patient has been back to see her  gynecologist and an ultrasound was done.  Did not plan any particular  treatment, but the patient has been started on Bentyl for irritable bowel  syndrome.   The patient did have a recent right middle finger operation with Dr.  Stanford Scotland.  That was for a cyst removal.  She did use some extra Oxycodone  initially after that surgery.   MEDICATIONS:  1. Celebrex 200 mg one tablet q.o.d.  2. Effexor XR two tablets daily.  3. Soma 350 mg one tablet q.8h. p.r.n.  4. Prilosec p.r.n.  5. Flonase p.r.n.  6. Oxycodone two tablets p.o. b.i.d. p.r.n.   PHYSICAL EXAMINATION:  GENERAL APPEARANCE:  Well-appearing, well-nourished  adult female.  VITAL SIGNS:  Blood pressure 120/66 with a pulse of 73, respiratory rate 14,  O2 saturation 98% on room air.  EXTREMITIES:  She has 4+-5-/5 strength throughout the bilateral upper and  lower extremities.  Bulk and tone were normal and reflexes were 2+ and  symmetrical.  She ambulates without any assisted device.   IMPRESSION:  1. Chronic back and bilateral knee pain with history of multiple joint     surgeries.  2. Mild degenerative disk  disease of the spine per MRI study December 2004.   PLAN:  In the clinic today, I did refill the patient's Celebrex medications.  We have also refilled the Oxycodone, and she will continue taking 5 mg two  tablets p.o. b.i.d.  This will be an increase from the three tablets that  she has been taking daily.  With that in place, we have increased the number  of tablets to 120 tablets which should cover her for one month's time.  We  plan on seeing her in follow up in this office in approximately two months  with refills prior to that appointment.  She will continue on her Robaxin  medication as noted above.      Ellwood Dense, M.D.   DC/MedQ  D:  08/08/2003 11:27:50  T:  08/08/2003 11:52:53  Job #:  [id number]

## 2010-06-15 NOTE — Assessment & Plan Note (Signed)
HISTORY OF PRESENT ILLNESS:  [name] returns today for follow up  evaluation.  She reports that she has been experiencing a lot of low back  pain, especially around the holidays.  She was doing some extra driving,  approximately four hours each way to her in-laws, and she had increased back  pain at that time.  She is fearful about the news that she has heard about  Celebrex and wishes to stop that medication and try another anti-  inflammatory medication.  She has Medicaid and they will only fill generics.  She has tried Relafen and Vioxx in the past.   The patient also reports that she has been feeling very depressed lately  despite using the Effexor at 75 mg daily.  We have decided to increase that  dose to two tablets for a total of 150 mg daily.  We have also made a  referral for Dr. Leonides Cave, the local psychologist, for coping and depression  related to her pain.   The patient reports that she has obtained a step master-type machine to be  used for exercise.  She is interested in trying to loose some weight and  tries to exercise on a daily basis.   MEDICATIONS:  1. Celebrex 200 mg daily.  2. Oxycodone 5 mg one tablet q.8h. p.r.n.  3. Effexor 75 mg daily.  4. Soma 350 mg q.8h. p.r.n.  5. Flonase p.r.n.  6. Prilosec daily p.r.n.   PHYSICAL EXAMINATION:  GENERAL APPEARANCE:  Well-appearing, overweight adult  female.  VITAL SIGNS:  Blood pressure 126/76 with pulse of 86.  O2 saturation 99% on  room air.  NEUROLOGICAL:  The patient has 5-/5 strength throughout the bilateral upper  and lower extremities.  Bulk and tone were normal.  Reflexes were 2+ and  symmetrical.   IMPRESSION:  1. Chronic disused pain of the right shoulder.  2. Low back and bilateral knees with history of multiple joint surgeries.  3. Mild degenerative disk disease, primary study November 2003 and follow up     December 2004.   PLAN:  We did review the patient's MRI scan completed January 06, 2003.   The  impression was post surgical changes at L5-S1 with what appears to be mostly  scar tissue in the right lateral recess and some spurring from the right  facet, both narrowing the lateral recess.  There was also an MRI scan of the  hips done at that time which was unremarkable apart from possible unusual  appearance of the ovaries which may be apparent for this patient or possibly  an indication of polycystic ovarian disease.  I did review both of these  with the patient in the office today.   We have decided to increase her Effexor to 75 mg two tablets daily.  I have  also started her on Naprosyn 375 mg one p.o. b.i.d.  I have refilled her  Oxycodone as of tomorrow, March 03, 2003, and refilled her [place].  I have  recommended that she follow up with her gynecologist.  We have also made the  referral to Dr. Leonides Cave for coping depression.      Ellwood Dense, M.D.   DC/MedQ  D:  03/02/2003 14:22:32  T:  03/02/2003 14:53:50  Job #:  [id number]

## 2010-06-15 NOTE — Assessment & Plan Note (Signed)
[name] returns to clinic today for follow-up evaluation.  She reports  that she is getting a fair amount of relief with the Duragesic and oxycodone  as noted below.  She does need a refill on both of these in the office  today.  She reports that she did see her orthopedist who had treated her  previously when she was taking her father in for an appointment.  She was  complaining of left knee pain at that time and the orthopedic surgeon  suggested a possible bone scan.  Unfortunately, she has outstanding debt  through that office and they could not see her.  It is unclear exactly what  the purpose of the bone scan is at this point but the patient would like to  have that scheduled through this office.  She would also like to have a left  steroid knee injection in the office today.   MEDICATIONS:  1.  Celebrex 200 mg every other day.  2.  Cymbalta 30 mg daily.  3.  Soma 350 mg nightly.  4.  Prilosec p.r.n.  5.  Flonase p.r.n.  6.  Oxycodone 5 mg one to two tablets p.o. b.i.d. p.r.n. (two to five per      day).  7.  Duragesic 25 mcg per hour, change q.72h.   REVIEW OF SYSTEMS:  Positive for diarrhea, abdominal pain and limb swelling.   PHYSICAL EXAMINATION:  GENERAL APPEARANCE:  A reasonably well-appearing,  slightly overweight adult female in mild acute discomfort.  VITAL SIGNS:  Blood pressure 127/77 with pulse 73, respiratory rate 18 and  O2 saturation 100% on room air.  NEUROLOGIC:  Patient has 5-/5 strength throughout the bilateral upper and  lower extremities.  She complains of left knee with range of motion.   IMPRESSION:  1.  Chronic low back pain with bilateral knee pain and history of multiple      joint surgery.  2.  Bilateral knee osteoarthritis.  3.  Mild degenerative disc disease of the lumbar spine.  4.  Probable fibromyalgia.   In the office today, we did refill the patient's Duragesic and oxycodone as  noted above.  We also had her sign a consent for a left  knee steroid  injection.  I prepared 3 mL syringe with 2 mL of 1% lidocaine and 1 mL of  Kenalog 40.  I cleansed the knee joint on the left side and injected  laterally.  The patient tolerated the procedure and good hemostasis was  obtained at the injection site.   Will plan on seeing the patient in follow-up in this office in approximately  two months' time.  We did proceed with a bone scan of her lower extremities  to rule out tumor or increased bone activity, especially involving her  anterior legs.           ______________________________  Ellwood Dense, M.D.     DC/MedQ  D:  05/06/2005 10:41:55  T:  05/06/2005 14:27:32  Job #:  [id number]

## 2010-06-15 NOTE — Assessment & Plan Note (Signed)
MEDICAL RECORD NUMBER:  [id number].   [name] returns to the clinic today for followup evaluation. She reports  that she is getting good relief from her combination of a Duragesic patch 25  mcg per hour along with her oxycodone. She generally uses two to five  oxycodone per day. She would like to refill on those as they are due this  week. She also needs refill on her Celebrex, Cymbalta and Soma medications.  She has tried to walk and usually walks at least 20 minutes per day. She is  cutting down on her tobacco use and is down to approximately 10 cigarettes  per day. She would like to be off of that completely by April.   MEDICATIONS:  1.  Celebrex 200 mg every other day.  2.  Cymbalta 30 mg daily.  3.  Soma 350 mg nightly.  4.  Prilosec p.r.n.  5.  Flonase p.r.n.  6.  Oxycodone 5 mg 1 to 2 tablets p.o. b.i.d. p.r.n. (2 to 5 per day).  7.  Duragesic patch 25 mcg per hour, change q.72h.   REVIEW OF SYSTEMS:  Noncontributory.   PHYSICAL EXAMINATION:  GENERAL:  Reasonably well appearing, slightly  overweight, adult female in mild to no acute discomfort.  VITAL SIGNS:  Blood pressure 123/73 with a pulse of 76, respiratory rate 16  and O2 saturation 98% on room air.   She has 5-/5 strength throughout the bilateral upper and lower extremities.  Bulk and tone were normal, and reflexes were 2+ and symmetrical.   IMPRESSION:  1.  Chronic low back pain and bilateral knee pain with history of multiple      joint surgeries.  2.  Bilateral knee osteoarthritis.  3.  Mild degenerative disk disease of the lumbar spine.  4.  Probable fibromyalgia.   In the office today, we did refill the patient's oxycodone along with her  Duragesic patch as of March 12, 2005. We refilled her Soma along with her  Celebrex and Cymbalta as of today with refills x3 for each. We will plan on  seeing the patient in followup in approximately two months' time. Hopefully,  she will be off tobacco completely at  that time and gradually increasing her  walking schedule.           ______________________________  Ellwood Dense, M.D.     DC/MedQ  D:  03/11/2005 15:56:53  T:  03/12/2005 11:15:00  Job #:  [id number]

## 2010-06-15 NOTE — Assessment & Plan Note (Signed)
HISTORY OF PRESENT ILLNESS:  Erica Mcdaniel returns to the clinic today for  follow up evaluation.  She reports that her left knee is doing well after  the last after the last steroid injection.  She is complaining of a lot of  pain of her right knee and wonders if she can have a steroid injection in  that right knee today.  She did undergo the bone scan that we have requested  and that was done May 27, 2005.  The impression was no acute or suspicious  findings.  There was a small focus of activity medially in the right hind  foot which was probably degenerative or posttraumatic.  She does have some  pain there on a periodic basis.  The patient reports poor sleep habits and  wonders if she has sleep apnea.  She plans to discuss possible sleep study  with her primary care physician.  She gets good relief from the Duragesic  and oxycodone in the office today and does need refills.   She does complain of bilateral wrist pain, especially worse through the  night.  She has been prescribed splints in the past but those are worn out.  She is unable to go back to see Dr. Stanford Scotland as she has outstanding bills  there that were not paid by [employer].   MEDICATIONS:  1.  Celebrex 200 mg q.o.d.  2.  Cymbalta 30 mg daily.  3.  Soma 350 mg q.h.s.  4.  Prilosec p.r.n.  5.  Flonase p.r.n.  6.  Oxycodone 5 mg 1-2 tablets p.o. b.i.d. p.r.n. (2-5 per day).  7.  Duragesic patch 25 mcg per hour, changed q.72 h.   REVIEW OF SYSTEMS:  Positive for shortness of breath.   PHYSICAL EXAMINATION:  GENERAL APPEARANCE:  Reasonably well appearing,  slightly overweight adult female in mild acute discomfort.  VITAL SIGNS:  Blood pressure 144/88, pulse 83, respirations 16, O2  saturation 99% on room air.  EXTREMITIES:  She has 5-/5 strength throughout the bilateral upper and lower  extremities.  She has some increased pain of her right knee with range of  motion.   IMPRESSION:  1.  Chronic low back  pain and bilateral knee pain with history of multiple      joint surgeries.  2.  Bilateral knee osteoarthritis.  3.  Mild degenerative disk disease of the lumbar spine.  4.  Probable fibromyalgia.   PLAN:  In the office today, we did have the patient sign a consent and did a  steroid injection in her right knee using Kenalog.  I prepared a 3 cc  syringe with 2 cc of 1% lidocaine and 1 cc of Kenalog 40.  She had already  signed the consent form.  We cleansed the right lateral knee and injected  the 3 cc mixture into the right knee with good hemostasis and good results  without problems.   We have referred the patient to [facility] for bilateral wrist  splints for carpal tunnel syndrome.  We also refilled her Oxycodone and  Duragesic in the office today.  I have encouraged her to see her primary  care  physician for sleep apnea study.  We will plan on seeing the patient in  follow up in this office in approximately two months time with refills prior  to that appointment as necessary.           ______________________________  Ellwood Dense, M.D.     DC/MedQ  D:  06/28/2005 10:08:30  T:  06/28/2005 13:05:55  Job #:  [id number]

## 2010-06-15 NOTE — Assessment & Plan Note (Signed)
MEDICAL RECORD NUMBER:  [id number].   [name] returns to clinic today for followup evaluation. She reports that  she did see Lonie Peak, P.A., at her primary care physician's office.  Apparently, her primary care physician is having neck surgery, but the P.A.  has been left in charge. Apparently, there was some recent blood work done  which showed an elevated cholesterol level, and she was told to monitor her  diet with a followup. She also had blood work done which showed high levels  of arthritic enzymes. She requested those labs be sent to this office, but  they have not been received in this office. She reports that no other plan  was put forth by Mr. Anna Genre. There was a suggestion of a possible  rheumatologic evaluation, but they apparently wished [place] to proceed with that  as no action was taken on their part.   The patient complains of diffuse pain throughout all of her joints. She has  had multiple joint surgeries of her wrists and shoulders along with knees.  All of the these continue to be very painful for her, and she actually asks  for an adjustment in her pain medicines. She presently takes oxycodone 5 mg  anywhere from 4 to 5 tablets per day with minimal relief. She has had her  antidepressant medication changed from Effexor to Cymbalta, and she feels  that that is doing better for her. She does complain also of periodic ankle  swelling.   MEDICATIONS:  1.  Celebrex 200 mg 1 tablet every other day.  2.  Cymbalta 30 mg q.d.  3.  Soma 350 mg 1 tablet q.8h. p.r.n.  4.  Prilosec p.r.n.  5.  Flonase p.r.n.  6.  Oxycodone 5 mg 1 to 2 tablets p.o. b.i.d. p.r.n. (approximately 4 per      day).   PHYSICAL EXAMINATION:  Reasonably well appearing, mildly overweight adult  female in moderate to severe pain. She has a blood pressure of 126/83 with  pulse of 84, respiratory rate 20, and O2 saturation 98% on room air. She has  5-/5 strength throughout the bilateral upper and lower  extremities. Bulk and  tone were normal. Reflexes were 2+ and symmetrical. Complained of pain with  range of motion of most all joints in her upper and lower extremities.   IMPRESSION:  1.  Chronic back pain with bilateral knee pain and history of multiple joint      surgeries.  2.  Bilateral knee osteoarthritis.  3.  Mild degenerative disk disease of the lumbar spine.   In the office today, we did refill the patient's oxycodone but until July 03, 2004 as her pharmacy is very strict about refill dates. We have started her  on Duragesic patch 25 mg per hour to be changed to q.72h. She understands  use of the medication is to hopefully limit the use of the oxycodone and  provide better pain relief. We will also have her get a copy of the lab work  that was done and try to get a copy to [place] as soon as possible. We will also  make a referral to a local rheumatologist, possibly Dr. Phylliss Bob, for  rheumatologic evaluation. I will continue to see her for pain management.   We will plan on seeing the patient in followup in approximately one to two  months' time.    DC/MedQ  D:  06/21/2004 10:47:33  T:  06/21/2004 11:26:58  Job #:  [id number]

## 2010-06-15 NOTE — Assessment & Plan Note (Signed)
Erica Mcdaniel returns to clinic today for follow-up evaluation.  She reports  that she is having worse pain over right hip.  Specifically, the pain is in  her right sacral region on the posterior aspect.  She does report occasional  pain into the lateral aspect of her right hip.  She also reports posterior  neck pain.  She has been back to see her OB/GYN and reportedly had an  ultrasound which reportedly only showed a small ovarian cyst with no other  abnormalities.   Subsequently, the patient reports that she has experienced neck pain.  There  is an MRI scan reportedly dating from July 14, 2000, which was reportedly  negative.  She also underwent a prior MRI scan of her lumbar spine dated  December 10, 2001.  This showed degenerative disc disease at L5-S1 with mild  spondylosis.  There was no evidence of focal or recurrent disc herniation at  this level.  There was mild sided neural foraminal narrowing at this level  due to adjacent degenerative disc disease and bony spurring.   The patient reports that she tries to be as active as possible but reports  that she has been less active and actually has gained some weight recently.   MEDICATIONS:  1. Soma 350 mg one p.o. q.8h. p.r.n.  2. Oxycodone immediate release 5 mg one q.8h. p.r.n.  3. Effexor XR 75 mg daily.  4. Celebrex 200 mg daily.   PHYSICAL EXAMINATION:  GENERAL APPEARANCE:  A well-appearing adult female.  VITAL SIGNS:  Blood pressure 113/81 with pulse of 78, O2 saturation 100% on  room air.  NEUROLOGIC:  She has strength of 5-/5 throughout the bilateral upper and  lower extremities.  Bulk and tone were normal.  Reflexes 2+ and symmetrical.   IMPRESSION:  1. Chronic disuse pain of the right shoulder, low back and bilateral knees     with history of multiple joint surgeries.  2. Mild lumbar degenerative disc disease per MRI study November 2003.   At the present time, we have decided to proceed with an MRI scan of her  lumbar  spine to be done at [facility] and this will include a right  hip MRI study.  These will be compared with the lumbar study November of  2003.  I have also refilled her Soma and oxycodone medications in the office  today.  I will plan on seeing her in follow-up in approximately two months  time and will be in touch with her if there are significant findings on the  MRI scan of her lumbar region and right hip region.      Ellwood Dense, M.D.   DC/MedQ  D:  12/29/2002 12:45:11  T:  12/29/2002 13:32:37  Job #:  [id number]
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
## 2010-06-15 NOTE — Assessment & Plan Note (Signed)
MEDICAL RECORD NUMBER:  [id number]   Erica Mcdaniel returns to the clinic today for followup evaluation.  She reports  that she has returned to her maiden name of Erica Mcdaniel with a history of prior  divorce.   In terms of her pain, she is reporting pain in her left hip which is worse  over the past 3 weeks.  She also has more pain of her right ankle and she is  planning to see Dr. Stanford Scotland, a local orthopedist.  She also reports that  she had an abnormal Pap smear and is planning to have colposcopy done  October 01, 2005.   The patient reports that she did lose another 2 pounds.  She has been having  a lot of stress recently with the medical conditions and Dr. Anna Genre has  increased her Cymbalta up to 120 mg daily.   The patient wonders about possibly increasing her Celebrex medication.  She  has a sufficient supply at the present time.   MEDICATIONS:  1. Celebrex 200 mg one every-other day.  2. Cymbalta 120 mg daily.  3. Soma 350 mg q.h.s.  4. Prilosec p.r.n.  5. Flonase p.r.n.  6. Oxycodone 5 mg one to two tablet p.o. b.i.d. p.r.n. (four to five per      day).  7. Duragesic patch 25 mcg per hour, change q.72h.   REVIEW OF SYSTEMS:  Noncontributory.   PHYSICAL EXAMINATION:  Well-appearing, slightly-overweight adult female in  mild acute discomfort.  Blood pressure 118/81 with a pulse of 78,  respiratory rate 18, and O2 saturation 99% on room air.  She has 5-/5  strength throughout the bilateral upper and lower extremities.  She has some  increased pain over the right knee with range of motion.  She also complains  of left hip pain with range of motion.   IMPRESSION:  1. Probable fibromyalgia.  2. Chronic low back pain and bilateral knee pain with history of multiple      joint surgeries.  3. Bilateral knee osteoarthritis.  4. Mild degenerative disc disease of the lumbar spine.   In the office today we did tell her that she could increase her Celebrex to  one tablet daily at 200  mg strength.  We also refilled her Tresa Garter, oxycodone,  and Duragesic, all as of today, September 25, 2005.  We will plan on seeing her  in followup in approximately 2 months' time with refills prior to that  appointment as necessary.           ______________________________  Ellwood Dense, M.D.     DC/MedQ  D:  09/25/2005 14:57:24  T:  09/26/2005 11:18:09  Job #:  [id number]

## 2010-06-15 NOTE — Assessment & Plan Note (Signed)
MEDICAL RECORD NUMBER:  [id number].   [name] returns to the clinic today for followup evaluation. She reports  that she was doing reasonably well overall. She was using her Duragesic  patch 25 mcg per hour, change q.72h. She also uses the oxycodone 5 mg  approximately 1 to 2 tablets b.i.d. She generally tends to use more of the  oxycodone on the third day of her patch and gets better relief on day 1 and  2 of the Duragesic patch. She also uses Celebrex 200 mg every other day. She  uses Soma 350 mg at night. She has had recent endoscopy that showed she had  some gastritis, and she was placed on Nexium. She tries to avoid over using  the Celebrex medication.   She has been back to see Dr. Phylliss Bob, her rheumatologist. He was checking her  vitamin D levels and found that she was low. He has placed her on a calcium  and vitamin D supplement and wants her primary care physician to check her  levels in the next couple of months. He feels that she may be suffering from  fibromyalgia type pain more than bone pain at this time.   MEDICATIONS:  1.  Celebrex 200 mg 1 tablet q.i.d.  2.  Cymbalta 30 mg daily.  3.  Soma 350 mg nightly.  4.  Prilosec p.r.n.  5.  Flonase p.r.n.  6.  Oxycodone 5 mg 1 to 2 tablets p.o. b.i.d. p.r.n. (approximately 2 to 4      per day).  7.  Duragesic patch 25 mcg per hour, change q.72h.   REVIEW OF SYSTEMS:  Positive for weight loss, night sweats, skin infection  with yeast, diarrhea, irritable bowel syndrome, constipation, limb swelling,  and ankle swelling.   PHYSICAL EXAMINATION:  Reasonably well appearing, moderately overweight,  adult female in mild acute discomfort. Blood pressure 127/86 with a pulse of  78, respiratory rate 16, and O2 saturation 98% on room air. She has 4+/5  strength throughout the bilateral upper and lower extremities. Bulk and tone  were normal, and reflexes were 2+ and symmetrical. She ambulates without any  assistive device.    IMPRESSION:  1.  Chronic low back pain and bilateral knee pain with history of multiple      joint surgeries.  2.  Bilateral knee osteoarthritis.  3.  Mild degenerative disk disease of the lumbar spine.  4.  Probable fibromyalgia.   In the office today, no refill on her Duragesic, oxycodone, or Soma was  necessary. We did refill her Celebrex medication. We will plan on seeing her  in followup in approximately two months' time. She is interested in seeking  out therapy and possibly interested in doing outpatient program at [facility] for the exercise program.   We will plan on seeing her in followup in approximately two months' time.           ______________________________  Ellwood Dense, M.D.    DC/MedQ  D:  11/09/2004 12:00:57  T:  11/09/2004 13:18:47  Job #:  [id number]

## 2010-06-15 NOTE — Assessment & Plan Note (Signed)
INTERVAL HISTORY:  [name] returns to clinic today for followup  evaluation.  She reports that her weight is stable over the past few weeks  to months.  She reports that she has been back to see Dr. Stanford Scotland, her  orthopedic physician.  Dr. Stanford Scotland apparently has operated on her shoulder  and finger in the past.  The patient reports that she saw him and was  complaining of bilateral knee and ankle pain which was worse lately.  He  apparently told her that she had arthralgia.  She was unsure exactly what  that meant and we discussed that in the office today.   The patient reports that she does have days that are better than others but  they are random.  She cannot pinpoint any particular activity that causes  her substantially increased pain although generally being very active causes  her some pain.  She does use her Celebrex approximately 200 mg every other  day.  She also uses the Robaxin approximately one half to one tablet twice a  day.  That is in place of the Soma medication.  She also uses her oxycodone  generally about four per day.   She reports that she is applying for Social Security Disability and has a  hearing coming up soon.  Her attorney Mr. Alona Bene has requested our  office notes and hopefully today's note can be included when we send them  off to Mr. Laural Benes next week.   MEDICATIONS:  1.  Celebrex 200 mg one tablet every other day.  2.  Effexor XR two tablets once daily.  3.  Soma 350 mg one tablet q.8h. p.r.n.  4.  Prilosec p.r.n.  5.  Flonase p.r.n.  6.  Oxycodone two tablets p.o. b.i.d. p.r.n.   PHYSICAL EXAMINATION:  Reasonably well-appearing mildly overweight adult  female in mild acute discomfort.  Blood pressure 116/69 with a pulse of 78,  respiratory rate 18, and O2 saturation 97% on room air.  She generally has  4+/5 strength throughout the bilateral upper and lower extremities.  Bulk  and tone are normal, reflexes are 2+ and symmetrical.  She  ambulates without  any assistive device in the office today.   IMPRESSION:  1.  Chronic back and bilateral knee pain with history of multiple joint      surgeries.  2.  Mild degenerative disk disease of the lumbar spine per MRI study      December 2004.   At the present time we have refilled her Robaxin and oxycodone medications  as noted above.  She will not need a refill on her Effexor or Celebrex at  this point.  We will plan on seeing the patient in followup in approximately  2-3 months' time with refill of medications prior to that appointment.  We  will send the office notes off to Mr. Laural Benes as they are available next  week.      Ellwood Dense, M.D.   DC/MedQ  D:  10/07/2003 11:29:17  T:  10/08/2003 16:52:40  Job #:  [id number]

## 2010-06-15 NOTE — Assessment & Plan Note (Signed)
 PAIN AND REHABILITATION NOTES:  Erica Mcdaniel   Monday, December 16, 2005   HISTORY OF PRESENT ILLNESS:  Erica Mcdaniel returns to the clinic today for  follow up evaluation.  She reports that since last clinic visit, she has had  a change in her insurance, not at her request.  She apparently had been  covered previously by [employer] through [employer].  She reports that that  subsequently was changed, and the new insurance company is not approving her  Duragesic patch.  She has been without that for some time now and noticed  that she has had substantial increased pain.  She has had to increase her  Oxycodone 5 mg up to six times per day in place of the Duragesic patch that  she had been using previously.  She is still trying to reach them by phone  and trying to get an explanation why her insurance was changed without her  knowledge.  Her primary care physician is trying to give her samples of some  of the other medications that have not been approved by her new insurance.  She is also requesting that we see if we have any samples of medications.   MEDICATIONS:  1. Celebrex 200 mg one tablet q.o.d.  2. Cymbalta 120 mg daily.  3. Soma 350 mg q.h.s.  4. Prilosec p.r.n.  5. Flonase p.r.n.  6. Oxycodone 5 mg 1-2 tablet p.o. b.i.d. p.r.n. (4-5 per day increased now      to 6 per day).  7. Duragesic patch 25 mcg per hour, change q.72 h. (not approved by      insurance).   REVIEW OF SYSTEMS:  Positive for weight gain, night sweats, diarrhea,  constipation, abdominal pain, limb swelling, cough.   PHYSICAL EXAMINATION:  GENERAL:  Well-appearing, moderately overweight adult  female in mild acute discomfort.  VITAL SIGNS:  Blood pressure was 133/75, pulse 73, respirations 15, O2  saturation 99% on room air.  EXTREMITIES:  She has 5-/5 strength throughout the bilateral upper and lower  extremities.  She has increased pain with ambulation, especially involving  her right  knee.   IMPRESSION:  1. Probable fibromyalgia.  2. Chronic low back pain with bilateral knee pain.  History of multiple      joint surgeries.  3. Bilateral knee osteoarthritis.  4. Mild degenerative disk disease of the lumbar spine.   PLAN:  In the office today, we did give her samples of Celebrex along with  Cymbalta.  She will be contacting her primary care physician to see what  other samples their office may have.  In the meantime, we will be using the  Oxycodone 5 mg 1-2 tablets p.o. t.i.d. p.r.n., maximum of six per day to  compensate for the Duragesic that she has not been approved for.  She is  still in consultation with that insurance carrier through Medicare to see if  there is any  reason that her Duragesic was stopped abruptly.  We will plan on seeing the  patient in follow up in this office in approximately 2-3 months time with  refills prior to that appointment if necessary.           ______________________________  Ellwood Dense, M.D.     DC/MedQ  D:  12/16/2005 11:32:37  T:  12/16/2005 14:18:26  Job #:  [id number]

## 2010-06-15 NOTE — Assessment & Plan Note (Signed)
[name] returns to clinic today for followup evaluation.  During the last  clinic visit on December 15, 2003, we did inject her right knee with 1%  lidocaine along with Kenalog 40.  The patient reports that she gained good  relief to approximately mid January 2006.  She reports at that time she  started to notice increased puffiness and pain along with burning sensation  of her right knee.  She reports more or less at the same time her left knee  started to give her the same type of problems.  She reports that previously  Dr. Stanford Mcdaniel had done x-rays in the fall of 2005 and she was told that she  had arthritis behind the kneecap.   The patient reports that she recently finished oral prednisone for  approximately 10 days that was given for hives.  She also reports that she  went back to see her primary care physician and had some type of injection  into her buttocks which also helped her with her hives.  She has not been  experiencing any problems with hives at this point.  She did have a stomach  virus approximately a month and a half ago and did use some extra pain  medicine.  She normally takes oxycodone two tablets p.o. b.i.d., but had  taken an extra one or two for a few days in a row.  She needs a refill in  the office today.  She also requests injections of her bilateral knees  today.   MEDICATIONS:  1.  Celebrex 200 mg every other day.  2.  Effexor XR 75 mg two tablets daily.  3.  Soma 350 mg one tablet q.8h. p.r.n.  4.  Prilosec p.r.n.  5.  Flonase p.r.n.  6.  Oxycodone 5 mg one to two tablets p.o. b.i.d. p.r.n. (three to five per      day).   PHYSICAL EXAMINATION:  GENERAL:  Well-appearing, moderately overweight,  adult female in mild acute discomfort.  She complains of most pain of  bilateral knees.  VITAL SIGNS:  Blood pressure is 131/77 with a pulse of 70, respirations 16,  and O2 saturation 98% on room air.  EXTREMITIES:  She has 5/5 strength throughout the  bilateral upper and  bilateral lower extremities.  She does complain of pain with manual muscle  testing of the knees and lower extremities.  There is no appreciable  effusion noted and no particular swelling noted.   IMPRESSION:  1.  Chronic back and bilateral knee pain with history of multiple joint      surgeries.  2.  Osteoarthritis of the bilateral knees.  3.  Mild degenerative disk disease of the lumbar spine per study, December      2004.   In the office today, we did refill her oxycodone 5 mg one to two tablets  p.o. b.i.d. p.r.n., a total of 120.  I have asked her to get down to her  normal amount of approximately four per day.   In terms of her knees, I have had her sign a consent for the bilateral  steroid injections.  She had gained some reasonable relief with the  injection on the right side.  I prepared a 5 mL syringe with 3 mL of 1%  lidocaine along with 1 mL of Kenalog 40.  Each mixture was prepared in the 5  mL syringe.  I cleansed each knee on the lateral aspect with Betadine and  alcohol pad.  I  injected each knee with the mixture of the 3 mL of 1%  lidocaine and 1 mL of Kenalog 40.  The patient tolerated the procedure well  and good hemostasis was obtained.   We will plan on seeing the patient in followup in this office in  approximately two months' time.      DC/MedQ  D:  03/02/2004 11:29:08  T:  03/02/2004 12:17:16  Job #:  [id number]

## 2010-06-15 NOTE — Assessment & Plan Note (Signed)
[name] returns to the clinic today for follow-up evaluation.  She  reports that overall, she is doing reasonably well on her oxycodone  medication along with the Soma and Celebrex.  She does need refills on  each of those in the office today.  Her insurance previously was not  paying for the Duragesic patch, and we instead stopped that medication  and increased her oxycodone up to approximately six per day.  Occasionally she uses, five, but for the most part uses six per day.  She does have right elbow pain, and she is requesting that we do an  injection in the office today.  She reports that the pain is over the  posterolateral aspect of her right elbow and causes her pain, especially  when she lifts something such as a gallon of milk.  She also has pain  involving her right knee and her medial right foot.  She is planning to  see Dr. Stanford Scotland, an orthopedist in [facility], for evaluation  regarding her leg and foot problems.   MEDICATIONS:  1. Celebrex 200 mg 1 tablet q.o.d.  2. Cymbalta 120 mg daily.  3. Soma 350 mg nightly.  4. Prilosec p.r.n.  5. Flonase p.r.n.  6. Oxycodone 5 mg 1-2 tablets p.o. t.i.d. p.r.n. (5-6 per day).   REVIEW OF SYSTEMS:  Noncontributory.   PHYSICAL EXAMINATION:  GENERAL:  A well-appearing, moderately overweight  adult female in mild acute discomfort.  VITAL SIGNS:  Blood pressure 140/77 with a pulse of 85, respiratory rate  16, O2 saturation 100% on room air.  NEUROMUSCULAR:  She ambulates without any assistive device.  Examination  of her elbow showed tenderness over the posterolateral aspect of her  elbow between the groove of her olecranon.  She has pain to palpation in  that area.  Examination of her right lower extremity shows a prominent  tarsal bone on the medial aspect but otherwise no acute abnormality.   IMPRESSION:  1. Probable fibromyalgia.  2. Chronic low back pain with bilateral knee pain.  3. Bilateral knee osteoarthritis.  4. Mild degenerative disk disease in the lumbar spine.   In the office today, we did refill this patient's Soma, oxycodone, and  Celebrex medications.   We had the patient sign an injection for consent for right elbow  injection.  I prepared a 3 cc syringe with 2 cc of 1% lidocaine and 1 cc  of Kenalog 40.  I cleansed the right elbow area with Betadine and then  an alcohol pad and then injected the area with approximately 2.5 to 3 cc  of the mixture.  The patient tolerated the procedure well, and good  hemostasis was obtained at the injection site.   We will plan on seeing the patient in followup in approximately three  months time with refill of medication prior to that appointment if  necessary.          ______________________________  Ellwood Dense, M.D.    DC/MedQ  D:  03/13/2006 11:21:35  T:  03/13/2006 11:43:28  Job #:  [id number]

## 2010-06-15 NOTE — Assessment & Plan Note (Signed)
HISTORY OF PRESENT ILLNESS:  [name] returns to the clinic today for  followup evaluation. She reports that she has pain present throughout most  of the joints of her body. She does specifically complain of knee pain,  worse on the right side than on the left. She also complains of bilateral  hip and low back pain. She does report that she gets good relief from the  medication that she is presently taking. This includes Oxycodone 5 mg 1  tablet to 2 tablets p.o. b.i.d. She generally uses 3 to 5 tablets per day.  She also uses Celebrex 200 mg every other day and Soma 350 mg 1 tablet q. 8  hours. This helps her with leg spasms. She reports that she did have a very  swollen throat a few days ago and was spitting up blood. She was evaluated  in the emergency room and prescribed antibiotics, which apparently have  helped to some degree. The patient reports that she does have a history of  knee pain and had an injection into the bilateral knees in July of 2005.  That reportedly helped her for approximately 2 months time. She would like  an injection into her right knee today. She reports that her depression is  suboptimally treated at the present time. She initially started on Effexor  XR 75 mg q.d. but that has been increased since February to 150 mg q.d. We  are planning to increase that further in the office today.   MEDICATIONS:  1.  Celebrex 200 mg 1 tablet every other day.  2.  Effexor XR 75 mg 2 tablets q.d.  3.  Soma 350 mg 1 tablet q. 8 hours p.r.n.  4.  Prilosec p.r.n.  5.  Flonase p.r.n.  6.  Oxycodone 5 mg 1 to 2 tablets p.o. b.i.d. p.r.n. (3 to 5 per day).   PHYSICAL EXAMINATION:  GENERAL:  A well appearing, mildly to moderately  overweight adult female in mild acute discomfort.  VITAL SIGNS:  Blood pressure was 118/80 with a pulse of 76 and respiratory  rate of 18 with O2 saturation 99% on room air.  EXTREMITIES:  The patient has 5 over 5 strength throughout the bilateral  upper and lower extremities. Bulk and tone were normal and reflexes were 2+  and symmetrical. She did have complaints of right knee pain with range of  motion.   IMPRESSION:  1.  Chronic back and bilateral knee pain with history of multiple joint      surgeries.  2.  Mild degenerative disk disease of the lumbar spine per MRI study      December of 2004.   PLAN:  In the clinic today, we discussed various issues. We have decided to  increase her Effexor XR to 75 mg 3 tablets p.o. q.h.s. This will be the  maximum dose of 225 mg q.d. We have also started her on a Nicoderm patch, as  she is interested in stopping smoking. I have started her at 14 mg a day for  4 to 6 weeks and give her a prescription for the Nicoderm patch at 14 mg. I  also gave her a prescription for the Nicoderm patch at 7 mg per day and she  is to use that over approximately the next 6 to 8 weeks, weaning gradually.  That has all been described to her in the office today. She has sufficient  supplies of the Celebrex, Soma and Oxycodone at the present time. Concerning  her right knee, we did have her sign a consent for an injection. We did  prepare a 5 cc syringe with 3 cc of 1% Lidocaine and 1 cc of Kenalog 40. The  knee was cleansed with a Betadine and alcohol pad. Knee joint was injected  with a mixture as noted above and the patient tolerated the procedure well.  Good hemostasis was obtained and a Band-Aid was placed over the injection  site. She did report relief when she walked out of the clinic, mostly  secondary to the anesthetic.  We will see how she responds to the steroid medication. We still may  consider further joint injections or possible trigger point injections in  the near future.       DC/MedQ  D:  12/15/2003 12:27:06  T:  12/16/2003 13:36:12  Job #:  [id number]

## 2010-06-15 NOTE — Assessment & Plan Note (Signed)
HISTORY OF PRESENT ILLNESS:  [name] returns to the clinic today for  follow up evaluation.  She reports that overall she is doing fairly well.  She did have recent bout of abdominal pain and saw Dr. Chales Abrahams.  Apparently,  endoscopy was performed along with biopsies, and she is awaiting results of  those tests.  She also reports that she is planning to see Dr. Phylliss Bob who she  has been referred to for evaluation of __________ abnormalities done in  February.  Apparently, an ANA was positive at 1:80 with a __________ pattern  March 19, 2004.  She also had a CRP measured at 0.41 on the same date.  Her cholesterol was elevated at 208 with LDL cholesterol of 139 and HDL  cholesterol of 44.  Liver function tests were normal at that time.   The patient continues to take her Celebrex 200 mg every other day.  She also  uses Soma only at bedtime.  She reports that the Duragesic patch that we  started during last clinic visit at 25 mcg per hour then changes q.72h. has  been helping her a lot.  She is not as stiff in the morning, and she reports  that she is able to do swimming on an occasional basis.  She still takes her  Oxycodone 5 mg approximately two tablets b.i.d. p.r.n., but takes none in  between doses that are scheduled.   MEDICATIONS:  1.  Celebrex 200 mg one tablet q.o.d.  2.  Cymbalta 30 mg daily.  3.  Soma 350 mg q.h.s.  4.  Prilosec p.r.n.  5.  Flonase p.r.n.  6.  Oxycodone 5 mg 1-2 tablets p.o. b.i.d. p.r.n. (approximately four per      day.  7.  Duragesic patch 25 mcg per hour, change q.72h.   REVIEW OF SYSTEMS:  Positive for night sweats, diarrhea, abdominal pain,  poor appetite, limb swelling and stomach pain.   PHYSICAL EXAMINATION:  A reasonably, well-appearing, mildly to moderately  overweight adult female in no acute discomfort.  Blood pressure 119/75 with  pulse 89, respirations 16 and O2 saturation 97% on room air.  She has 5-/5  strength throughout the bilateral upper  and lower extremities.  Bulk and  tone were normal.  Reflexes were 2+ and symmetrical.   IMPRESSION:  1.  Chronic low back pain and bilateral knee pain with history of multiple      joint surgeries.  2.  Bilateral knee osteoarthritis.  3.  Mild degenerative disk disease of the lumbar spine.   PLAN:  In the office today, we did refill the patient's Celebrex and Soma.  She had sufficient Duragesic and Oxycodone at this time.  We will see what  the results of her evaluation with Dr. Phylliss Bob are and go from there.  We will  refill her Oxycodone and Duragesic as necessary.  We will plan on seeing her  in follow up in approximately two months time.       DC/MedQ  D:  08/09/2004 10:31:51  T:  08/09/2004 11:28:46  Job #:  [id number]

## 2010-06-18 ENCOUNTER — Other Ambulatory Visit: Payer: Self-pay | Admitting: Orthopaedic Surgery

## 2010-06-18 ENCOUNTER — Ambulatory Visit
Admission: RE | Admit: 2010-06-18 | Discharge: 2010-06-18 | Disposition: A | Discharge status: Home / Self Care | Payer: Medicare Other | Source: Ambulatory Visit | Attending: Orthopaedic Surgery | Admitting: Orthopaedic Surgery

## 2010-06-18 DIAGNOSIS — R52 Pain, unspecified: Secondary | ICD-10-CM

## 2010-06-18 DIAGNOSIS — R609 Edema, unspecified: Secondary | ICD-10-CM

## 2010-08-29 ENCOUNTER — Other Ambulatory Visit (HOSPITAL_COMMUNITY): Payer: Self-pay | Admitting: Physician Assistant

## 2010-08-29 DIAGNOSIS — Z1231 Encounter for screening mammogram for malignant neoplasm of breast: Secondary | ICD-10-CM

## 2010-09-05 ENCOUNTER — Ambulatory Visit (HOSPITAL_COMMUNITY)
Admission: RE | Admit: 2010-09-05 | Discharge: 2010-09-05 | Disposition: A | Discharge status: Home / Self Care | Payer: Medicare Other | Source: Ambulatory Visit | Attending: Physician Assistant | Admitting: Physician Assistant

## 2010-09-05 DIAGNOSIS — Z1231 Encounter for screening mammogram for malignant neoplasm of breast: Secondary | ICD-10-CM | POA: Insufficient documentation

## 2010-09-12 ENCOUNTER — Other Ambulatory Visit: Payer: Self-pay | Admitting: Physician Assistant

## 2010-09-12 DIAGNOSIS — R928 Other abnormal and inconclusive findings on diagnostic imaging of breast: Secondary | ICD-10-CM

## 2010-09-18 ENCOUNTER — Other Ambulatory Visit: Payer: Medicare Other

## 2010-09-27 ENCOUNTER — Ambulatory Visit
Admission: RE | Admit: 2010-09-27 | Discharge: 2010-09-27 | Disposition: A | Discharge status: Home / Self Care | Payer: Medicare Other | Source: Ambulatory Visit | Attending: Physician Assistant | Admitting: Physician Assistant

## 2010-09-27 ENCOUNTER — Encounter: Payer: Self-pay | Admitting: Gastroenterology

## 2010-09-27 DIAGNOSIS — R928 Other abnormal and inconclusive findings on diagnostic imaging of breast: Secondary | ICD-10-CM

## 2010-10-30 LAB — DIFFERENTIAL
Basophils Absolute: 0
Eosinophils Absolute: 0.2
Eosinophils Relative: 2
Lymphocytes Relative: 28
Neutrophils Relative %: 65

## 2010-10-30 LAB — BASIC METABOLIC PANEL
BUN: 6
Creatinine, Ser: 0.6
GFR calc non Af Amer: >60
Glucose, Bld: 110 — ABNORMAL HIGH
Potassium: 3.4 — ABNORMAL LOW

## 2010-10-30 LAB — CBC
HCT: 39.5
Platelets: 260
RDW: 13.2

## 2010-10-30 LAB — PROTIME-INR: Prothrombin Time: 13.5

## 2010-11-02 ENCOUNTER — Other Ambulatory Visit (INDEPENDENT_AMBULATORY_CARE_PROVIDER_SITE_OTHER): Payer: Medicare Other

## 2010-11-02 ENCOUNTER — Encounter: Payer: Self-pay | Admitting: Gastroenterology

## 2010-11-02 ENCOUNTER — Ambulatory Visit (INDEPENDENT_AMBULATORY_CARE_PROVIDER_SITE_OTHER): Payer: Medicare Other | Admitting: Gastroenterology

## 2010-11-02 DIAGNOSIS — K219 Gastro-esophageal reflux disease without esophagitis: Secondary | ICD-10-CM | POA: Insufficient documentation

## 2010-11-02 DIAGNOSIS — R5383 Other fatigue: Secondary | ICD-10-CM | POA: Insufficient documentation

## 2010-11-02 DIAGNOSIS — K909 Intestinal malabsorption, unspecified: Secondary | ICD-10-CM

## 2010-11-02 DIAGNOSIS — D649 Anemia, unspecified: Secondary | ICD-10-CM

## 2010-11-02 DIAGNOSIS — R5381 Other malaise: Secondary | ICD-10-CM

## 2010-11-02 DIAGNOSIS — K439 Ventral hernia without obstruction or gangrene: Secondary | ICD-10-CM | POA: Insufficient documentation

## 2010-11-02 DIAGNOSIS — Z860100 Personal history of colon polyps, unspecified: Secondary | ICD-10-CM | POA: Insufficient documentation

## 2010-11-02 DIAGNOSIS — R109 Unspecified abdominal pain: Secondary | ICD-10-CM

## 2010-11-02 LAB — CBC WITH DIFFERENTIAL/PLATELET
Basophils Relative: 0.3 % (ref 0.0–3.0)
Eosinophils Relative: 1.4 % (ref 0.0–5.0)
HCT: 45.2 % (ref 36.0–46.0)
Hemoglobin: 15.5 g/dL — ABNORMAL HIGH (ref 12.0–15.0)
Lymphs Abs: 2.1 10*3/uL (ref 0.7–4.0)
MCV: 92 fl (ref 78.0–100.0)
Monocytes Absolute: 0.5 10*3/uL (ref 0.1–1.0)
RBC: 4.91 Mil/uL (ref 3.87–5.11)
WBC: 9.5 10*3/uL (ref 4.5–10.5)

## 2010-11-02 LAB — BASIC METABOLIC PANEL
BUN: 11 mg/dL (ref 6–23)
Creatinine, Ser: 0.7 mg/dL (ref 0.4–1.2)
GFR: 102.32 mL/min (ref >60.00)
Potassium: 3.5 mEq/L (ref 3.5–5.1)

## 2010-11-02 LAB — IBC PANEL: Iron: 85 ug/dL (ref 42–145)

## 2010-11-02 LAB — VITAMIN B12: Vitamin B-12: 269 pg/mL (ref 211–911)

## 2010-11-02 LAB — FERRITIN: Ferritin: 46.4 ng/mL (ref 10.0–291.0)

## 2010-11-02 LAB — AMYLASE: Amylase: 24 U/L — ABNORMAL LOW (ref 27–131)

## 2010-11-02 LAB — LIPASE: Lipase: 25 U/L (ref 11.0–59.0)

## 2010-11-02 LAB — HEPATIC FUNCTION PANEL: Total Bilirubin: 0.8 mg/dL (ref 0.3–1.2)

## 2010-11-02 MED ORDER — TRAMADOL HCL 50 MG PO TABS
50.0000 mg | ORAL_TABLET | Freq: Four times a day (QID) | ORAL | Status: AC | PRN
Start: 2010-11-02 — End: 2011-11-02

## 2010-11-02 MED ORDER — PEG-KCL-NACL-NASULF-NA ASC-C 100 G PO SOLR: 1.0000 | Freq: Once | ORAL | Status: DC

## 2010-11-02 MED ORDER — OMEPRAZOLE 40 MG PO CPDR: 40.0000 mg | DELAYED_RELEASE_CAPSULE | Freq: Two times a day (BID) | ORAL | Status: DC

## 2010-11-02 NOTE — Patient Instructions (Addendum)
You have been given a separate informational sheet regarding your tobacco use, the importance of quitting and local resources to help you quit. Colon/Endo [facility] 11/12/10 3:00 pm arrive at 2:00 pm Moviprep sent to your pharmacy Increase Omeprazole to twice daily Tramadol has been sent to your pharmacy for pain Abdominal Ultrasound scheduled for 11/05/10 7:15 am at [facility] nothing to eat or drink after midnight

## 2010-11-02 NOTE — Progress Notes (Signed)
History of Present Illness:  This is a pleasant 40 year old Caucasian female with several weeks of right upper quadrant pain around the severe nature with associated nausea but no emesis. She has had acute exacerbation of her pain over the last few days despite taking daily Prilosec 40 mg for GERD. She had an endoscopy several years ago, and also ultrasonography 3-4 years ago. She denies any specific hepatobiliary or systemic complaints. She does have rather typical acid reflux symptoms but denies dysphagia, melena or hematochezia. Patient does not smoke, abuse ethanol or NSAIDs. Family history is negative for any history of gallbladder disease. The patient denies a past history of hepatitis or pancreatitis.  I have reviewed this patient's present history, medical and surgical past history, allergies and medications.     ROS: The remainder of the 10 point ROS is negative     Physical Exam: General well developed well nourished patient in no acute distress, appearing her stated age Eyes PERRLA, no icterus, fundoscopic exam per opthamologist Skin no lesions noted Neck supple, no adenopathy, no thyroid enlargement, no tenderness Chest clear to percussion and auscultation Heart no significant murmurs, gallops or rubs noted Abdomen no hepatosplenomegaly masses or tenderness, BS normal. . Extremities no acute joint lesions, edema, phlebitis or evidence of cellulitis. Neurologic patient oriented x 3, cranial nerves intact, no focal neurologic deficits noted. Psychological mental status normal and normal affect.  Assessment and plan: Rule out cholelithiasis and intermittent biliary colic. A schedule gallbladder ultrasound ASAP. We have increased her Nexium to 40 mg twice a day with when necessary 50 mg tramadol every 6-8 hours for pain. I have scheduled outpatient endoscopy, and she also is due for colonoscopy with a history of multiple colon polyps and a previous poor prep at the time of colonoscopy.  Screening labs including liver function tests have been ordered.  Encounter Diagnoses  Name Primary?  . Abdominal pain   . Anemia   . Fatigue   . Abnormal intestinal absorption    . Ventral hernia

## 2010-11-05 ENCOUNTER — Telehealth: Payer: Self-pay | Admitting: Gastroenterology

## 2010-11-05 ENCOUNTER — Other Ambulatory Visit (HOSPITAL_COMMUNITY): Payer: Medicare Other

## 2010-11-05 LAB — GLIA (IGA/G) + TTG IGA: Gliadin IgA: 9.6 U/mL (ref <20)

## 2010-11-05 MED ORDER — OXYCODONE-ACETAMINOPHEN 5-325 MG PO TABS: ORAL_TABLET | ORAL | Status: DC

## 2010-11-05 MED ORDER — PROCHLORPERAZINE MALEATE 10 MG PO TABS: 10.0000 mg | ORAL_TABLET | Freq: Four times a day (QID) | ORAL | Status: AC | PRN

## 2010-11-05 NOTE — Telephone Encounter (Signed)
lmom for pt that Dr Jarold Motto ordered Percocet, but she will have to come in to pick it up.

## 2010-11-05 NOTE — Telephone Encounter (Signed)
PERCOCET 5 MG ,35,Q6-8H.Marland KitchenMarland Kitchen

## 2010-11-05 NOTE — Telephone Encounter (Signed)
Spoke with Radiology scheduling who stated we ordered the test and then cancelled it < 10 minutes later. I spoke with the CMA who stated she placed the order on the wrong pt and asked scheduling to correct it. Whatever the reason, pt got up very early for nothing. She reports she's having a lot of nausea and pain- tramadol isn't helping that much. Pt was very understanding and only asked that I not order an appt so early since she lives an hour away and has arthritis and fibromyalgia. Labs are not complete in the system, but they were all on the same order and there is no Folate reagent; it should be in tomorrow. Lab faxed me what was done and most results are normal. U/S is scheduled for 11/08/10 at 11am at [facility]  Dr Jarold Motto requested something for nausea and something stronger for pain. Thanks.

## 2010-11-05 NOTE — Telephone Encounter (Signed)
Pt will have her friend Arlys John pick up the script.

## 2010-11-08 ENCOUNTER — Other Ambulatory Visit (HOSPITAL_COMMUNITY): Payer: Medicare Other

## 2010-11-08 ENCOUNTER — Telehealth: Payer: Self-pay | Admitting: *Deleted

## 2010-11-08 ENCOUNTER — Ambulatory Visit (HOSPITAL_COMMUNITY)
Admission: RE | Admit: 2010-11-08 | Discharge: 2010-11-08 | Disposition: A | Discharge status: Home / Self Care | Payer: Medicare Other | Source: Ambulatory Visit | Attending: Gastroenterology | Admitting: Gastroenterology

## 2010-11-08 DIAGNOSIS — K7689 Other specified diseases of liver: Secondary | ICD-10-CM | POA: Insufficient documentation

## 2010-11-08 DIAGNOSIS — R1011 Right upper quadrant pain: Secondary | ICD-10-CM | POA: Insufficient documentation

## 2010-11-08 NOTE — Telephone Encounter (Signed)
Pt called to report she forgot about her NPO status and drank coffee this am. Teresa Pelton in Radiology and pt was r/s to 3pm so she could be NPO for 6 hours; pt stated understanding.

## 2010-11-12 ENCOUNTER — Ambulatory Visit (AMBULATORY_SURGERY_CENTER): Payer: Medicare Other | Admitting: Gastroenterology

## 2010-11-12 ENCOUNTER — Encounter: Payer: Self-pay | Admitting: Gastroenterology

## 2010-11-12 DIAGNOSIS — K589 Irritable bowel syndrome without diarrhea: Secondary | ICD-10-CM

## 2010-11-12 DIAGNOSIS — Z86010 Personal history of colonic polyps: Secondary | ICD-10-CM

## 2010-11-12 DIAGNOSIS — K219 Gastro-esophageal reflux disease without esophagitis: Secondary | ICD-10-CM

## 2010-11-12 DIAGNOSIS — D126 Benign neoplasm of colon, unspecified: Secondary | ICD-10-CM

## 2010-11-12 DIAGNOSIS — R109 Unspecified abdominal pain: Secondary | ICD-10-CM

## 2010-11-12 MED ORDER — SODIUM CHLORIDE 0.9 % IV SOLN: 500.0000 mL | INTRAVENOUS | Status: DC

## 2010-11-12 NOTE — Patient Instructions (Signed)
Please review discharge instructions (blue and green sheets)  Await biopsy results  Resume your regular medications

## 2010-11-12 NOTE — Progress Notes (Signed)
Propofol sedation administer by Cathlyn Parsons, CRNA.   Upon arrival to RR, pt tearful, c/o abd pain as a "9."  Abdomen soft upon palpations, passing large amounts of air.  HOB down and feet up to help with passing of air  1610- Rates abd pain as a "5" now, still passing large amt of air.  Levsin 0.125mg  SL, 2 tablets given KW 1617- Rates abd pain as a "3" now, states it is "much better."  Still passing large amts of air  KW 1627- Rates abd pain as a "1" now.  Still passing air.  KW  Denies pain at discharge

## 2010-11-13 ENCOUNTER — Telehealth: Payer: Self-pay

## 2010-11-13 ENCOUNTER — Encounter: Payer: Self-pay | Admitting: Gastroenterology

## 2010-11-13 DIAGNOSIS — R109 Unspecified abdominal pain: Secondary | ICD-10-CM

## 2010-11-13 LAB — HELICOBACTER PYLORI SCREEN-BIOPSY: UREASE: NEGATIVE

## 2010-11-13 NOTE — Telephone Encounter (Signed)
I spoke with the pt's husband, Arlys John, and he said she was sleeping like a baby.  I asked him to let her know we called to check on her and if she had any questions or concerns to please call us back.  If she did not have any questions or concerns it was no need to return the call. maw

## 2010-11-19 ENCOUNTER — Ambulatory Visit (INDEPENDENT_AMBULATORY_CARE_PROVIDER_SITE_OTHER): Payer: Medicare Other | Admitting: General Surgery

## 2010-11-27 ENCOUNTER — Encounter: Payer: Self-pay | Admitting: Gastroenterology

## 2011-01-29 HISTORY — PX: KNEE ARTHROSCOPY: SUR90

## 2012-03-10 ENCOUNTER — Other Ambulatory Visit: Payer: Self-pay | Admitting: Orthopaedic Surgery

## 2012-03-10 DIAGNOSIS — M25521 Pain in right elbow: Secondary | ICD-10-CM

## 2012-04-08 ENCOUNTER — Ambulatory Visit
Admission: RE | Admit: 2012-04-08 | Discharge: 2012-04-08 | Disposition: A | Discharge status: Home / Self Care | Payer: Medicare Other | Source: Ambulatory Visit | Attending: Orthopaedic Surgery | Admitting: Orthopaedic Surgery

## 2012-04-08 DIAGNOSIS — M25521 Pain in right elbow: Secondary | ICD-10-CM

## 2012-10-07 ENCOUNTER — Other Ambulatory Visit: Payer: Self-pay | Admitting: Physician Assistant

## 2012-10-07 DIAGNOSIS — Z1231 Encounter for screening mammogram for malignant neoplasm of breast: Secondary | ICD-10-CM

## 2012-10-28 ENCOUNTER — Ambulatory Visit
Admission: RE | Admit: 2012-10-28 | Discharge: 2012-10-28 | Disposition: A | Discharge status: Home / Self Care | Payer: Medicare Other | Source: Ambulatory Visit | Attending: Physician Assistant | Admitting: Physician Assistant

## 2012-10-28 DIAGNOSIS — Z1231 Encounter for screening mammogram for malignant neoplasm of breast: Secondary | ICD-10-CM

## 2012-10-30 ENCOUNTER — Other Ambulatory Visit: Payer: Self-pay | Admitting: Physician Assistant

## 2012-10-30 DIAGNOSIS — R928 Other abnormal and inconclusive findings on diagnostic imaging of breast: Secondary | ICD-10-CM

## 2012-11-03 ENCOUNTER — Other Ambulatory Visit: Payer: Self-pay | Admitting: Physician Assistant

## 2012-11-03 ENCOUNTER — Ambulatory Visit
Admission: RE | Admit: 2012-11-03 | Discharge: 2012-11-03 | Disposition: A | Discharge status: Home / Self Care | Payer: Medicare Other | Source: Ambulatory Visit | Attending: Physician Assistant | Admitting: Physician Assistant

## 2012-11-03 DIAGNOSIS — R928 Other abnormal and inconclusive findings on diagnostic imaging of breast: Secondary | ICD-10-CM

## 2012-11-05 ENCOUNTER — Other Ambulatory Visit: Payer: Medicare Other

## 2012-11-10 ENCOUNTER — Ambulatory Visit
Admission: RE | Admit: 2012-11-10 | Discharge: 2012-11-10 | Disposition: A | Discharge status: Home / Self Care | Payer: Medicare Other | Source: Ambulatory Visit | Attending: Physician Assistant | Admitting: Physician Assistant

## 2012-11-10 ENCOUNTER — Other Ambulatory Visit: Payer: Self-pay | Admitting: Physician Assistant

## 2012-11-10 DIAGNOSIS — R928 Other abnormal and inconclusive findings on diagnostic imaging of breast: Secondary | ICD-10-CM

## 2012-11-16 NOTE — H&P (Signed)
Norlene Campbell, MD   Jacqualine Code, PA-C [address] [place], [place]  [address]                             [phone]   ORTHOPAEDIC HISTORY & PHYSICAL  Erica Mcdaniel MRN:  [id number] DOB/SEX:  [birth year]/female  CHIEF COMPLAINT:  Painful right elbow  HISTORY: Patient is a 42 y.o. female presented with a history of pain in the right elbow for 10 months. Onset of symptoms was abrupt starting 10 months ago with gradually worsening course since that time. Prior procedure on the elbow has been 2 corticosteroid injections. Patient also has been treated conservatively with over-the-counter NSAIDs and activity modification. Patient currently rates pain at 7 out of 10 with activity. There is pain at night. present.  They have been previously treated with: NSAIDS: Motrin, Steriods with mild improvement    PAST MEDICAL HISTORY: Patient Active Problem List   Diagnosis Date Noted  . Ventral hernia 11/02/2010  . Abnormal intestinal absorption  11/02/2010  . Abdominal pain 11/02/2010  . Fatigue 11/02/2010  . Personal history of colonic polyps 11/02/2010  . GERD (gastroesophageal reflux disease) 11/02/2010  . GERD 08/06/2007  . IBS 08/06/2007  . ABDOMINAL PAIN-RUQ 08/06/2007  . OBESITY 08/05/2007  . ANXIETY 08/05/2007  . SOMATIZATION DISORDER 08/05/2007  . OVARIAN CYST 08/05/2007  . PMS 08/05/2007  . DYSFUNCTIONAL UTERINE BLEEDING 08/05/2007  . FEMALE INFERTILITY 08/05/2007  . DEGENERATIVE JOINT DISEASE 08/05/2007  . OSTEOARTHRITIS, KNEES, BILATERAL 08/05/2007  . KNEE PAIN, BILATERAL 08/05/2007  . DEGENERATIVE DISC DISEASE 08/05/2007  . LOW BACK PAIN, CHRONIC 08/05/2007  . FIBROMYALGIA 08/05/2007  . INSOMNIA 08/05/2007  . PAP SMEAR, ABNORMAL 08/05/2007   Past Medical History  Diagnosis Date  . IBS (irritable bowel syndrome)   . Fibromyalgia   . Arthritis   . Polycystic ovarian disease   . Tendonitis   . Depression   . Fibromyalgia   . Hypertension   .  Pneumonia     hx  . GERD (gastroesophageal reflux disease)   . Cancer     skin squamous cell arm   Past Surgical History  Procedure Laterality Date  . Ovarian cyst surgery  1983  . Back surgery    . Appendectomy    . Rotator cuff repair  2003,04    right, and elbow,left  . Neuroplasty / transposition median nerve at carpal tunnel bilateral    . Back surgery  2010    rods put in  . Trigger finger release Right 08  . Knee arthroscopy Left 13     MEDICATIONS:   Prescriptions prior to admission  Medication Sig Dispense Refill  . celecoxib (CELEBREX) 100 MG capsule Take 100 mg by mouth 2 (two) times daily.        . cetirizine (ZYRTEC) 10 MG tablet Take 10 mg by mouth daily.      . DULoxetine (CYMBALTA) 60 MG capsule Take 60 mg by mouth 2 (two) times daily.        . hydrochlorothiazide (HYDRODIURIL) 25 MG tablet Take 25 mg by mouth daily.      . lansoprazole (PREVACID) 30 MG capsule Take 30 mg by mouth daily.       . Multiple Vitamins-Minerals (MULTIVITAMIN WITH MINERALS) tablet Take 1 tablet by mouth daily.      . Vitamin D, Ergocalciferol, (DRISDOL) 50000 UNITS CAPS Take 50,000 Units by mouth. Once a week  ALLERGIES:   Allergies  Allergen Reactions  . Biaxin [Clarithromycin]     REACTION: n\T\v, Hives    REVIEW OF SYSTEMS:  A comprehensive review of systems was negative.   FAMILY HISTORY:   Family History  Problem Relation Age of Onset  . Heart disease Brother   . Arthritis Brother   . Lung cancer Mother     meso  . Arthritis Mother   . Hyperlipidemia Brother   . Arthritis Sister   . Colon polyps Brother   . Thyroid disease Father   . Arthritis Father   . Diabetes Maternal Grandmother     SOCIAL HISTORY:   History  Substance Use Topics  . Smoking status: Former Smoker -- 1.00 packs/day for 20 years    Types: Cigarettes  . Smokeless tobacco: Never Used     Comment: taking wellbutrin to help quitting  use vqpor cig now  . Alcohol Use: No       EXAMINATION: Vital signs in last 24 hours: Temp:  [97 F (36.1 C)] 97 F (36.1 C) (11/11 0905) Pulse Rate:  [72] 72 (11/11 0905) Resp:  [20] 20 (11/11 0905) BP: (139)/(67) 139/67 mmHg (11/11 0905) SpO2:  [100 %] 100 % (11/11 0905)  Head is normocephalic.   Eyes:  Pupils equal, round and reactive to light and accommodation.  Extraocular intact. ENT: Ears, nose, and throat were benign.   Neck: supple, no bruits were noted.   Chest: good expansion.   Lungs: essentially clear.   Cardiac: regular rhythm and rate, normal S1, S2.  No murmurs appreciated. Pulses :  2+ bilateral and symmetric in upper extremities. Abdomen is scaphoid, soft, nontender, no masses palpable, normal bowel sounds                  present. CNS:  He is oriented x3 and cranial nerves II-XII grossly intact. Breast, rectal, and genital exams: not performed and not indicated for an orthopedic evaluation. Musculoskeletal:  she has full flexion/extension of the elbow with pain.  With the arm in full extension and the wrist dorsiflexed, resistance causes her excruciating pain at the lateral epicondyle area.  Flexion of the wrist with full extension, has no symptoms medially.  She has a little bit of tenderness over the medial epicondyle.  She has full pronation and supination.   Imaging Review An MRI scan of the elbow revealed that she had a mild epicondylitis medially.  There was no bone marrow edema.  She had moderate lateral epicondylitis with intrasubstance tearing of the common extensor origin.  No full-thickness tear or retraction.  She also had the biceps and brachialis tendons to be intact, but she had some bursitis along the distal biceps tendon.  There did not appear to be any abnormality within the joint except for a very small effusion.  The ulnar nerve was diminutive at the cubital tunnel, but there was no convincing evidence of compression.  ASSESSMENT:Right Lateral epicondylitis   Past Medical History   Diagnosis Date  . IBS (irritable bowel syndrome)   . Fibromyalgia   . Arthritis   . Polycystic ovarian disease   . Tendonitis   . Depression   . Fibromyalgia   . Hypertension   . Pneumonia     hx  . GERD (gastroesophageal reflux disease)   . Cancer     skin squamous cell arm    PLAN: Plan for right Right Lateral epicondylar debridement with repair.  The procedure,  risks, and benefits of total  knee arthroplasty were presented and reviewed. The risks including but not limited to infection, blood clots, vascular and nerve injury, stiffness,  among others were discussed. The patient acknowledged the explanation, agreed to proceed.   Kabria Hetzer 12/08/2012, 11:23 AM

## 2012-11-27 ENCOUNTER — Encounter (HOSPITAL_COMMUNITY): Payer: Self-pay | Admitting: Pharmacy Technician

## 2012-12-01 ENCOUNTER — Encounter (HOSPITAL_COMMUNITY)
Admission: RE | Admit: 2012-12-01 | Discharge: 2012-12-01 | Disposition: A | Discharge status: Home / Self Care | Payer: Medicare Other | Source: Ambulatory Visit | Attending: Orthopaedic Surgery | Admitting: Orthopaedic Surgery

## 2012-12-01 ENCOUNTER — Other Ambulatory Visit (HOSPITAL_COMMUNITY): Payer: Self-pay | Admitting: *Deleted

## 2012-12-01 ENCOUNTER — Encounter (HOSPITAL_COMMUNITY): Payer: Self-pay

## 2012-12-01 ENCOUNTER — Encounter (HOSPITAL_COMMUNITY)
Admission: RE | Admit: 2012-12-01 | Discharge: 2012-12-01 | Disposition: A | Discharge status: Home / Self Care | Payer: Medicare Other | Source: Ambulatory Visit | Attending: Anesthesiology | Admitting: Anesthesiology

## 2012-12-01 DIAGNOSIS — Z01818 Encounter for other preprocedural examination: Secondary | ICD-10-CM | POA: Insufficient documentation

## 2012-12-01 DIAGNOSIS — Z01810 Encounter for preprocedural cardiovascular examination: Secondary | ICD-10-CM | POA: Insufficient documentation

## 2012-12-01 DIAGNOSIS — Z01812 Encounter for preprocedural laboratory examination: Secondary | ICD-10-CM | POA: Insufficient documentation

## 2012-12-01 HISTORY — DX: Gastro-esophageal reflux disease without esophagitis: K21.9

## 2012-12-01 HISTORY — DX: Malignant (primary) neoplasm, unspecified: C80.1

## 2012-12-01 HISTORY — DX: Essential (primary) hypertension: I10

## 2012-12-01 HISTORY — DX: Pneumonia, unspecified organism: J18.9

## 2012-12-01 LAB — CBC
HCT: 43.2 % (ref 36.0–46.0)
Hemoglobin: 15.4 g/dL — ABNORMAL HIGH (ref 12.0–15.0)
MCHC: 35.6 g/dL (ref 30.0–36.0)
RBC: 4.91 MIL/uL (ref 3.87–5.11)
RDW: 12.4 % (ref 11.5–15.5)
WBC: 11.8 10*3/uL — ABNORMAL HIGH (ref 4.0–10.5)

## 2012-12-01 LAB — COMPREHENSIVE METABOLIC PANEL
ALT: 13 U/L (ref 0–35)
Albumin: 4 g/dL (ref 3.5–5.2)
Alkaline Phosphatase: 76 U/L (ref 39–117)
BUN: 8 mg/dL (ref 6–23)
CO2: 26 mEq/L (ref 19–32)
Chloride: 102 mEq/L (ref 96–112)
Potassium: 4 mEq/L (ref 3.5–5.1)
Sodium: 138 mEq/L (ref 135–145)
Total Bilirubin: 0.3 mg/dL (ref 0.3–1.2)
Total Protein: 7.6 g/dL (ref 6.0–8.3)

## 2012-12-01 LAB — URINALYSIS, ROUTINE W REFLEX MICROSCOPIC
Bilirubin Urine: NEGATIVE
Glucose, UA: NEGATIVE mg/dL
Hgb urine dipstick: NEGATIVE
Ketones, ur: NEGATIVE mg/dL
Leukocytes, UA: NEGATIVE
Protein, ur: NEGATIVE mg/dL
Urobilinogen, UA: 0.2 mg/dL (ref 0.0–1.0)
pH: 6 (ref 5.0–8.0)

## 2012-12-01 NOTE — Pre-Procedure Instructions (Addendum)
Anneta Rounds  12/01/2012   Your procedure is scheduled on:  12/08/12  Report to [facility]  2 * 3 at 840 AM.  Call this number if you have problems the morning of surgery: [phone]   Remember:   Do not eat food or drink liquids after midnight.   Take these medicines the morning of surgery with A SIP OF WATER: cymbalta, prevacid,           STOP celebrex 12/02/12   Do not wear jewelry, make-up or nail polish.  Do not wear lotions, powders, or perfumes. You may wear deodorant.  Do not shave 48 hours prior to surgery. Men may shave face and neck.  Do not bring valuables to the hospital.  [facility] is not responsible                  for any belongings or valuables.               Contacts, dentures or bridgework may not be worn into surgery.  Leave suitcase in the car. After surgery it may be brought to your room.  For patients admitted to the hospital, discharge time is determined by your                treatment team.               Patients discharged the day of surgery will not be allowed to drive  home.  Name and phone number of your driver:   Special Instructions: Shower using CHG 2 nights before surgery and the night before surgery.  If you shower the day of surgery use CHG.  Use special wash - you have one bottle of CHG for all showers.  You should use approximately 1/3 of the bottle for each shower.   Please read over the following fact sheets that you were given: Pain Booklet, Coughing and Deep Breathing and Surgical Site Infection Prevention

## 2012-12-01 NOTE — Progress Notes (Addendum)
req'd ekg, office notes from pcp Erica conroy pa [facility] [phone]

## 2012-12-03 NOTE — Progress Notes (Signed)
Anesthesia chart review:  Patient is a 42 year old female scheduled for lateral epicondular debridement with repair right elbow on 12/08/12 by Dr. Cleophas Dunker.    History includes former smoker, obesity, fibromyalgia, IBS, PCOS, depression, HTN, GERD, PNA, skin cancer. PCP is Lonie Peak, PA-C with [facility].  EKG on 12/01/12 showed NSR with sinus arrhythmia, cannot rule out anterior infarct (age undetermined).  It appears stable when compared to the 08/19/12 EKG from her PCP office.  No CV symptoms were documented at her PAT visit.  Preoperative CXR and labs noted.  Patient will be evaluated by her assigned anesthesiologist but if no acute changes/new CV symptomology then I would anticipate that she could proceed as planned.  Velna Ochs [facility]/Anesthesiology Phone [phone] 12/03/2012 10:59 AM

## 2012-12-07 MED ORDER — SODIUM CHLORIDE 0.9 % IV SOLN: INTRAVENOUS | Status: DC

## 2012-12-07 MED ORDER — CEFAZOLIN SODIUM-DEXTROSE 2-3 GM-% IV SOLR
2.0000 g | INTRAVENOUS | Status: AC
  Administered 2012-12-08: 2 g via INTRAVENOUS

## 2012-12-08 ENCOUNTER — Ambulatory Visit (HOSPITAL_COMMUNITY)
Admission: RE | Admit: 2012-12-08 | Discharge: 2012-12-08 | Disposition: A | Discharge status: Home / Self Care | Payer: Medicare Other | Source: Ambulatory Visit | Attending: Orthopaedic Surgery | Admitting: Orthopaedic Surgery

## 2012-12-08 ENCOUNTER — Encounter (HOSPITAL_COMMUNITY): Payer: Medicare Other | Admitting: Vascular Surgery

## 2012-12-08 ENCOUNTER — Ambulatory Visit (HOSPITAL_COMMUNITY): Payer: Medicare Other | Admitting: Anesthesiology

## 2012-12-08 ENCOUNTER — Encounter (HOSPITAL_COMMUNITY)
Admission: RE | Disposition: A | Discharge status: Home / Self Care | Payer: Self-pay | Source: Ambulatory Visit | Attending: Orthopaedic Surgery

## 2012-12-08 ENCOUNTER — Encounter (HOSPITAL_COMMUNITY): Payer: Self-pay | Admitting: *Deleted

## 2012-12-08 DIAGNOSIS — Z87891 Personal history of nicotine dependence: Secondary | ICD-10-CM | POA: Insufficient documentation

## 2012-12-08 DIAGNOSIS — I10 Essential (primary) hypertension: Secondary | ICD-10-CM | POA: Insufficient documentation

## 2012-12-08 DIAGNOSIS — M7710 Lateral epicondylitis, unspecified elbow: Secondary | ICD-10-CM | POA: Insufficient documentation

## 2012-12-08 DIAGNOSIS — K219 Gastro-esophageal reflux disease without esophagitis: Secondary | ICD-10-CM | POA: Insufficient documentation

## 2012-12-08 DIAGNOSIS — F411 Generalized anxiety disorder: Secondary | ICD-10-CM | POA: Insufficient documentation

## 2012-12-08 DIAGNOSIS — F329 Major depressive disorder, single episode, unspecified: Secondary | ICD-10-CM | POA: Insufficient documentation

## 2012-12-08 DIAGNOSIS — M7711 Lateral epicondylitis, right elbow: Secondary | ICD-10-CM | POA: Diagnosis present

## 2012-12-08 DIAGNOSIS — M25429 Effusion, unspecified elbow: Secondary | ICD-10-CM | POA: Insufficient documentation

## 2012-12-08 DIAGNOSIS — IMO0001 Reserved for inherently not codable concepts without codable children: Secondary | ICD-10-CM | POA: Insufficient documentation

## 2012-12-08 DIAGNOSIS — F3289 Other specified depressive episodes: Secondary | ICD-10-CM | POA: Insufficient documentation

## 2012-12-08 HISTORY — PX: TENDON RECONSTRUCTION: SHX2487

## 2012-12-08 SURGERY — RECONSTRUCTION, TENDON OR LIGAMENT, ELBOW
Anesthesia: General | Site: Elbow | Laterality: Right | Wound class: Clean

## 2012-12-08 MED ORDER — MIDAZOLAM HCL 5 MG/5ML IJ SOLN
INTRAMUSCULAR | Status: DC | PRN
  Administered 2012-12-08: 2 mg via INTRAVENOUS

## 2012-12-08 MED ORDER — ONDANSETRON HCL 4 MG/2ML IJ SOLN
INTRAMUSCULAR | Status: DC | PRN
  Administered 2012-12-08: 4 mg via INTRAVENOUS

## 2012-12-08 MED ORDER — BUPIVACAINE-EPINEPHRINE PF 0.25-1:200000 % IJ SOLN
INTRAMUSCULAR | Status: AC
  Filled 2012-12-08: qty 30

## 2012-12-08 MED ORDER — HYDROMORPHONE HCL PF 1 MG/ML IJ SOLN
INTRAMUSCULAR | Status: AC
  Filled 2012-12-08: qty 1

## 2012-12-08 MED ORDER — BUPIVACAINE-EPINEPHRINE 0.25% -1:200000 IJ SOLN
INTRAMUSCULAR | Status: DC | PRN
  Administered 2012-12-08: 10 mL

## 2012-12-08 MED ORDER — OXYCODONE-ACETAMINOPHEN 5-325 MG PO TABS: 1.0000 | ORAL_TABLET | ORAL | Status: DC | PRN

## 2012-12-08 MED ORDER — OXYCODONE HCL 5 MG PO TABS
5.0000 mg | ORAL_TABLET | Freq: Once | ORAL | Status: AC | PRN
  Administered 2012-12-08: 5 mg via ORAL

## 2012-12-08 MED ORDER — CHLORHEXIDINE GLUCONATE 4 % EX LIQD: 60.0000 mL | Freq: Every day | CUTANEOUS | Status: DC

## 2012-12-08 MED ORDER — LIDOCAINE HCL (CARDIAC) 20 MG/ML IV SOLN
INTRAVENOUS | Status: DC | PRN
  Administered 2012-12-08: 50 mg via INTRAVENOUS

## 2012-12-08 MED ORDER — OXYCODONE HCL 5 MG PO TABS
ORAL_TABLET | ORAL | Status: AC
  Filled 2012-12-08: qty 1

## 2012-12-08 MED ORDER — MEPERIDINE HCL 25 MG/ML IJ SOLN: 6.2500 mg | INTRAMUSCULAR | Status: DC | PRN

## 2012-12-08 MED ORDER — FENTANYL CITRATE 0.05 MG/ML IJ SOLN
INTRAMUSCULAR | Status: DC | PRN
  Administered 2012-12-08 (×2): 50 ug via INTRAVENOUS
  Administered 2012-12-08: 100 ug via INTRAVENOUS
  Administered 2012-12-08: 50 ug via INTRAVENOUS

## 2012-12-08 MED ORDER — LACTATED RINGERS IV SOLN
INTRAVENOUS | Status: DC | PRN
  Administered 2012-12-08: 12:00:00 via INTRAVENOUS

## 2012-12-08 MED ORDER — OXYCODONE HCL 5 MG/5ML PO SOLN: 5.0000 mg | Freq: Once | ORAL | Status: AC | PRN

## 2012-12-08 MED ORDER — PROPOFOL 10 MG/ML IV BOLUS
INTRAVENOUS | Status: DC | PRN
  Administered 2012-12-08: 200 mg via INTRAVENOUS

## 2012-12-08 MED ORDER — LACTATED RINGERS IV SOLN
INTRAVENOUS | Status: DC
  Administered 2012-12-08: 09:00:00 via INTRAVENOUS

## 2012-12-08 MED ORDER — CEFAZOLIN SODIUM 1-5 GM-% IV SOLN
INTRAVENOUS | Status: AC
  Filled 2012-12-08: qty 100

## 2012-12-08 MED ORDER — PROMETHAZINE HCL 25 MG/ML IJ SOLN: 6.2500 mg | INTRAMUSCULAR | Status: DC | PRN

## 2012-12-08 MED ORDER — CHLORHEXIDINE GLUCONATE 4 % EX LIQD: 60.0000 mL | Freq: Once | CUTANEOUS | Status: DC

## 2012-12-08 MED ORDER — HYDROMORPHONE HCL PF 1 MG/ML IJ SOLN
0.2500 mg | INTRAMUSCULAR | Status: DC | PRN
  Administered 2012-12-08 (×4): 0.5 mg via INTRAVENOUS

## 2012-12-08 MED ORDER — 0.9 % SODIUM CHLORIDE (POUR BTL) OPTIME
TOPICAL | Status: DC | PRN
  Administered 2012-12-08: 1000 mL

## 2012-12-08 SURGICAL SUPPLY — 59 items
ANCH SUT 1 SHRT SM RGD INSRTR (Anchor) ×1 IMPLANT
ANCHOR SUT 1.45 SZ 1 SHORT (Anchor) ×1 IMPLANT
APL SKNCLS STERI-STRIP NONHPOA (GAUZE/BANDAGES/DRESSINGS) ×1
BANDAGE ELASTIC 3 VELCRO ST LF (GAUZE/BANDAGES/DRESSINGS) ×1 IMPLANT
BANDAGE ELASTIC 4 VELCRO ST LF (GAUZE/BANDAGES/DRESSINGS) ×3 IMPLANT
BANDAGE GAUZE ELAST BULKY 4 IN (GAUZE/BANDAGES/DRESSINGS) ×1 IMPLANT
BENZOIN TINCTURE PRP APPL 2/3 (GAUZE/BANDAGES/DRESSINGS) ×1 IMPLANT
BNDG CMPR 9X4 STRL LF SNTH (GAUZE/BANDAGES/DRESSINGS) ×1
BNDG COHESIVE 4X5 TAN STRL (GAUZE/BANDAGES/DRESSINGS) ×2 IMPLANT
BNDG ESMARK 4X9 LF (GAUZE/BANDAGES/DRESSINGS) ×2 IMPLANT
CLOTH BEACON ORANGE TIMEOUT ST (SAFETY) ×2 IMPLANT
CLSR STERI-STRIP ANTIMIC 1/2X4 (GAUZE/BANDAGES/DRESSINGS) ×1 IMPLANT
COVER SURGICAL LIGHT HANDLE (MISCELLANEOUS) ×2 IMPLANT
DECANTER SPIKE VIAL GLASS SM (MISCELLANEOUS) ×1 IMPLANT
DRAPE OEC MINIVIEW 54X84 (DRAPES) IMPLANT
DRILL BIT 2.4MM (BIT) ×1 IMPLANT
DRSG EMULSION OIL 3X3 NADH (GAUZE/BANDAGES/DRESSINGS) ×2 IMPLANT
DURAPREP 26ML APPLICATOR (WOUND CARE) ×2 IMPLANT
ELECT REM PT RETURN 9FT ADLT (ELECTROSURGICAL) ×2
ELECTRODE REM PT RTRN 9FT ADLT (ELECTROSURGICAL) ×1 IMPLANT
GLOVE BIOGEL PI IND STRL 8 (GLOVE) ×1 IMPLANT
GLOVE BIOGEL PI INDICATOR 8 (GLOVE) ×1
GLOVE ECLIPSE 8.0 STRL XLNG CF (GLOVE) ×2 IMPLANT
GOWN STRL NON-REIN LRG LVL3 (GOWN DISPOSABLE) ×6 IMPLANT
KIT BASIN OR (CUSTOM PROCEDURE TRAY) ×2 IMPLANT
KIT ROOM TURNOVER OR (KITS) ×2 IMPLANT
MANIFOLD NEPTUNE II (INSTRUMENTS) ×2 IMPLANT
NDL SUT 6 .5 CRC .975X.05 MAYO (NEEDLE) IMPLANT
NEEDLE MAYO TAPER (NEEDLE)
NS IRRIG 1000ML POUR BTL (IV SOLUTION) ×2 IMPLANT
PACK ORTHO EXTREMITY (CUSTOM PROCEDURE TRAY) ×2 IMPLANT
PAD ARMBOARD 7.5X6 YLW CONV (MISCELLANEOUS) ×4 IMPLANT
PAD CAST 4YDX4 CTTN HI CHSV (CAST SUPPLIES) ×2 IMPLANT
PADDING CAST ABS 4INX4YD NS (CAST SUPPLIES) ×1
PADDING CAST ABS COTTON 4X4 ST (CAST SUPPLIES) IMPLANT
PADDING CAST COTTON 4X4 STRL (CAST SUPPLIES) ×4
PASSER SUT SWANSON 36MM LOOP (INSTRUMENTS) ×1 IMPLANT
PENCIL BUTTON HOLSTER BLD 10FT (ELECTRODE) ×1 IMPLANT
SPLINT PLASTER CAST XFAST 4X15 (CAST SUPPLIES) ×1 IMPLANT
SPLINT PLASTER CAST XFAST 5X30 (CAST SUPPLIES) IMPLANT
SPLINT PLASTER XFAST SET 5X30 (CAST SUPPLIES) ×1
SPLINT PLASTER XTRA FAST SET 4 (CAST SUPPLIES) ×1
SPONGE GAUZE 4X4 12PLY (GAUZE/BANDAGES/DRESSINGS) ×2 IMPLANT
SPONGE LAP 4X18 X RAY DECT (DISPOSABLE) ×4 IMPLANT
STAPLER VISISTAT 35W (STAPLE) ×2 IMPLANT
STOCKINETTE IMPERVIOUS 9X36 MD (GAUZE/BANDAGES/DRESSINGS) ×2 IMPLANT
SUCTION FRAZIER TIP 10 FR DISP (SUCTIONS) ×2 IMPLANT
SUT FIBERWIRE 2-0 18 17.9 3/8 (SUTURE) ×4
SUT MNCRL AB 3-0 PS2 18 (SUTURE) ×1 IMPLANT
SUT VIC AB 0 CT1 27 (SUTURE) ×4
SUT VIC AB 0 CT1 27XBRD ANBCTR (SUTURE) ×2 IMPLANT
SUT VIC AB 2-0 CTB1 (SUTURE) ×4 IMPLANT
SUTURE FIBERWR 2-0 18 17.9 3/8 (SUTURE) ×2 IMPLANT
TOWEL OR 17X24 6PK STRL BLUE (TOWEL DISPOSABLE) ×2 IMPLANT
TOWEL OR 17X26 10 PK STRL BLUE (TOWEL DISPOSABLE) ×2 IMPLANT
TUBE CONNECTING 12X1/4 (SUCTIONS) ×2 IMPLANT
UNDERPAD 30X30 INCONTINENT (UNDERPADS AND DIAPERS) ×2 IMPLANT
WATER STERILE IRR 1000ML POUR (IV SOLUTION) ×1 IMPLANT
YANKAUER SUCT BULB TIP NO VENT (SUCTIONS) ×2 IMPLANT

## 2012-12-08 NOTE — Anesthesia Postprocedure Evaluation (Signed)
  Anesthesia Post-op Note  Patient: Erica Mcdaniel  Procedure(s) Performed: Procedure(s) with comments: ELBOW TENDON RECONSTRUCTION/Right Lateral Epicondylar Debridement with Repair. (Right) - Right Lateral Epicondylar Debridement with Repair.  Patient Location: PACU  Anesthesia Type:General  Level of Consciousness: awake, alert , oriented and patient cooperative  Airway and Oxygen Therapy: Patient Spontanous Breathing  Post-op Pain: mild  Post-op Assessment: Post-op Vital signs reviewed, Patient's Cardiovascular Status Stable, Respiratory Function Stable, Patent Airway, No signs of Nausea or vomiting and Pain level controlled  Post-op Vital Signs: Reviewed and stable  Complications: No apparent anesthesia complications

## 2012-12-08 NOTE — Anesthesia Preprocedure Evaluation (Addendum)
Anesthesia Evaluation  Patient identified by MRN, date of birth, ID band Patient awake    Reviewed: Allergy & Precautions, H&P , NPO status , Patient's Chart, lab work & pertinent test results  History of Anesthesia Complications Negative for: history of anesthetic complications  Airway Mallampati: II TM Distance: >3 FB Neck ROM: Full    Dental  (+) Dental Advisory Given   Pulmonary Current Smoker,  breath sounds clear to auscultation  Pulmonary exam normal       Cardiovascular hypertension, Pt. on medications Rhythm:Regular Rate:Normal     Neuro/Psych Anxiety Depression    GI/Hepatic Neg liver ROS, GERD-  Medicated and Controlled,  Endo/Other  Morbid obesity  Renal/GU negative Renal ROS     Musculoskeletal  (+) Fibromyalgia -  Abdominal (+) + obese,   Peds  Hematology   Anesthesia Other Findings   Reproductive/Obstetrics                         Anesthesia Physical Anesthesia Plan  ASA: III  Anesthesia Plan: General   Post-op Pain Management:    Induction: Intravenous  Airway Management Planned: LMA  Additional Equipment:   Intra-op Plan:   Post-operative Plan:   Informed Consent: I have reviewed the patients History and Physical, chart, labs and discussed the procedure including the risks, benefits and alternatives for the proposed anesthesia with the patient or authorized representative who has indicated his/her understanding and acceptance.   Dental advisory given  Plan Discussed with: CRNA and Surgeon  Anesthesia Plan Comments: (Plan routine monitors, GA- LMA OK)        Anesthesia Quick Evaluation

## 2012-12-08 NOTE — Discharge Summary (Signed)
Erica Campbell, MD   Jacqualine Code, PA-C [address] [place], [place]  [address]                             [phone]  PATIENT ID: Erica Mcdaniel        MRN:  [id number]          DOB/AGE: 1970-03-31 / 42 y.o.    DISCHARGE SUMMARY  ADMISSION DATE:    12/08/2012 DISCHARGE DATE:   12/08/2012   ADMISSION DIAGNOSIS: Right Lateral Epicondylitis    DISCHARGE DIAGNOSIS:  Right Lateral Epicondylitis    ADDITIONAL DIAGNOSIS: Principal Problem:   Lateral epicondylitis of right elbow  Past Medical History  Diagnosis Date  . IBS (irritable bowel syndrome)   . Fibromyalgia   . Arthritis   . Polycystic ovarian disease   . Tendonitis   . Depression   . Fibromyalgia   . Hypertension   . Pneumonia     hx  . GERD (gastroesophageal reflux disease)   . Cancer     skin squamous cell arm    PROCEDURE: Procedure(s): ELBOW TENDON RECONSTRUCTION/Right Lateral Epicondylar Debridement with Repair.  on 12/08/2012  CONSULTS: none     HISTORY: Patient is a 42 y.o. female presented with a history of pain in the right elbow for 10 months. Onset of symptoms was abrupt starting 10 months ago with gradually worsening course since that time. Prior procedure on the elbow has been 2 corticosteroid injections. Patient also has been treated conservatively with over-the-counter NSAIDs and activity modification. Patient currently rates pain at 7 out of 10 with activity. There is pain at night. present.   HOSPITAL COURSE:  Erica Mcdaniel is a 42 y.o. admitted on 12/08/2012 and found to have a diagnosis of Right Lateral Epicondylitis.  After appropriate laboratory studies were obtained  they were taken to the operating room on 12/08/2012 and underwent  Procedure(s): ELBOW TENDON RECONSTRUCTION/Right Lateral Epicondylar Debridement with Repair.     They were given perioperative antibiotics:  Anti-infectives   Start     Dose/Rate Route Frequency Ordered Stop   12/08/12 0600  ceFAZolin (ANCEF)  IVPB 2 g/50 mL premix     2 g 100 mL/hr over 30 Minutes Intravenous On call to O.R. 12/07/12 1426 12/08/12 1206    .  Tolerated the procedure well.  No complications.  The remainder of the hospital course was dedicated to ambulation and strengthening.   The patient was discharged on Day of Surgery in  Stable condition.  Blood products given:none  DIAGNOSTIC STUDIES: Recent vital signs: Patient Vitals for the past 24 hrs:  BP Temp Temp src Pulse Resp SpO2  12/08/12 0905 139/67 mmHg 97 F (36.1 C) Oral 72 20 100 %       Recent laboratory studies:  Recent Labs  12/01/12 1425  WBC 11.8*  HGB 15.4*  HCT 43.2  PLT 336    Recent Labs  12/01/12 1425  NA 138  K 4.0  CL 102  CO2 26  BUN 8  CREATININE 0.66  GLUCOSE 96  CALCIUM 9.7   Lab Results  Component Value Date   INR 1.0 11/22/2007     Recent Radiographic Studies :  Dg Chest 2 View  12/01/2012   CLINICAL DATA:  Hypertension. Preoperative evaluation.  EXAM: CHEST  2 VIEW  COMPARISON:  Chest radiograph 02/20/2009.  FINDINGS: Stable cardiac and mediastinal contours. No consolidative pulmonary opacities. No pleural effusion or pneumothorax. Minimal  atelectasis peripheral left lower lung. Regional skeleton is unremarkable.  IMPRESSION: No acute cardiopulmonary process.   Electronically Signed   By: Annia Belt M.D.   On: 12/01/2012 15:37   Mm Digital Diagnostic Unilat R  11/10/2012   CLINICAL DATA:  Subsequent encounter for clip placement at time of ultrasound-guided core needle biopsy performed earlier today.  EXAM: DIGITAL DIAGNOSTIC UNILATERAL RIGHT MAMMOGRAM  COMPARISON:  Previous exams  FINDINGS: Mammographic images were obtained following ultrasound guided biopsy of a 0.7 cm mass in the upper outer right breast. The top hat shaped tissue marker clip extruded approximately 0.7 cm posterior to the mass.  IMPRESSION: Migration of the top hat shaped tissue marker clip approximately 0.7 cm posterior to the biopsied mass.   Final Assessment: Post Procedure Mammograms for Marker Placement   Electronically Signed   By: Hulan Saas M.D.   On: 11/10/2012 14:46   [place] Rt Breast Bx W Loc Dev 1st Lesion Img Bx Spec US Guide  11/11/2012   ADDENDUM REPORT: 11/11/2012 12:53  ADDENDUM: The PATHOLOGY associated with the right breast ultrasound-guided core biopsy demonstrated a benign fibroadenoma. This is concordant with imaging findings. Sonnie Alamo, RN discussed findings with the patient by telephone and answered her questions. The patient's biopsy site was clean and dry without hematoma formation. Post biopsy wound care instructions were reviewed with the patient. Recommend followup bilateral screening mammography in 1 year. Patient was encouraged to call the breast center for additional questions or concerns.   Electronically Signed   By: Anselmo Pickler M.D.   On: 11/11/2012 12:53   11/11/2012   CLINICAL DATA:  0.7 cm mass in the upper-outer right breast at recent imaging. Subsequent encounter for biopsy.  EXAM: ULTRASOUND-GUIDED CORE NEEDLE BIOPSY OF THE RIGHT BREAST  COMPARISON:  Previous exams.  PROCEDURE: I met with the patient and we discussed the procedure of ultrasound-guided biopsy, including benefits and alternatives. We discussed the high likelihood of a successful procedure. We discussed the risks of the procedure including infection, bleeding, tissue injury, clip migration, and inadequate sampling. Informed written consent was given. The usual time-out protocol was performed immediately prior to the procedure.  Using sterile technique and 2% Lidocaine as local anesthetic, under direct ultrasound visualization, a 12 gauge vacuum-assisteddevice was used to perform biopsy of the 0.7 cm mass at the 10 o'clock position of the right breast 3 cm from the nipple using a lateral approach. At the conclusion of the procedure, a top hat shaped tissue marker clip was deployed into the biopsy cavity. Follow-up 2-view mammogram was  performed and dictated separately.  IMPRESSION: Ultrasound-guided biopsy of the 0.7 cm mass in the upper outer right breast. No apparent complications.  Electronically Signed: By: Hulan Saas M.D. On: 11/10/2012 14:49    DISCHARGE INSTRUCTIONS: Discharge Orders   Future Orders Complete By Expires   Call MD / Call 911  As directed    Comments:     If you experience chest pain or shortness of breath, CALL 911 and be transported to the hospital emergency room.  If you develope a fever above 101 F, pus (white drainage) or increased drainage or redness at the wound, or calf pain, call your surgeon's office.   Constipation Prevention  As directed    Comments:     Drink plenty of fluids.  Prune juice and/or coffee may be helpful.  You may use a stool softener, such as Colace (over the counter) 100 mg twice a day.  Use MiraLax (over  the counter) for constipation as needed but this may take several days to work.  Mag Citrate --OR-- Milk of Magnesia may also be used but follow directions on the label.   Diet general  As directed    Discharge instructions  As directed    Comments:     KEEP DRESSING AND SPLINT ON TILL SEEN IN THE OFFICE.  KEEP IT CLEAN AND DRY.  MAY MOVE FINGERS AND SHOULDER.  SLING FOR COMFORT AND PROTECTION.   Driving restrictions  As directed    Comments:     No driving for 6 weeks   Increase activity slowly as tolerated  As directed    Lifting restrictions  As directed    Comments:     No lifting for 6 weeks   Non weight bearing  As directed    Questions:     Laterality:  right   Extremity:  Upper      DISCHARGE MEDICATIONS:     Medication List         celecoxib 100 MG capsule  Commonly known as:  CELEBREX  Take 100 mg by mouth 2 (two) times daily.     cetirizine 10 MG tablet  Commonly known as:  ZYRTEC  Take 10 mg by mouth daily.     DULoxetine 60 MG capsule  Commonly known as:  CYMBALTA  Take 60 mg by mouth 2 (two) times daily.     hydrochlorothiazide 25  MG tablet  Commonly known as:  HYDRODIURIL  Take 25 mg by mouth daily.     lansoprazole 30 MG capsule  Commonly known as:  PREVACID  Take 30 mg by mouth daily.     multivitamin with minerals tablet  Take 1 tablet by mouth daily.     oxyCODONE-acetaminophen 5-325 MG per tablet  Commonly known as:  ROXICET  Take 1-2 tablets by mouth every 4 (four) hours as needed for moderate pain or severe pain.     Vitamin D (Ergocalciferol) 50000 UNITS Caps capsule  Commonly known as:  DRISDOL  Take 50,000 Units by mouth. Once a week        FOLLOW UP VISIT:       Follow-up Information   Follow up with [facility], Claude Manges, MD. Schedule an appointment as soon as possible for a visit on 12/14/2012.   Specialty:  Orthopedic Surgery   Contact information:   [address] [place] [address] [phone]       DISPOSITION:   Home  CONDITION:  Stable   Geeta Dworkin 12/08/2012, 1:11 PM

## 2012-12-08 NOTE — Op Note (Signed)
PATIENT ID:      Erica Mcdaniel  MRN:     [id number] DOB/AGE:    1971-01-24 / 42 y.o.       OPERATIVE REPORT    DATE OF PROCEDURE:  12/08/2012       PREOPERATIVE DIAGNOSIS:   Right Lateral Epicondylitis                                                       Estimated body mass index is 36.65 kg/(m^2) as calculated from the following:   Height as of 12/01/12: 5\' 8"  (1.727 m).   Weight as of 11/12/10: 109.317 kg (241 lb).     POSTOPERATIVE DIAGNOSIS:   Right Lateral Epicondylitis                                                                     Estimated body mass index is 36.65 kg/(m^2) as calculated from the following:   Height as of 12/01/12: 5\' 8"  (1.727 m).   Weight as of 11/12/10: 109.317 kg (241 lb).     PROCEDURE:  Procedure(s): ELBOW TENDON RECONSTRUCTION/Right Lateral Epicondylar Debridement with Repair. right     SURGEON:  Norlene Campbell, MD    ASSISTANT:   Jacqualine Code, PA-C   (Present and scrubbed throughout the case, critical for assistance with exposure, retraction, instrumentation, and closure.)          ANESTHESIA: general     DRAINS: none :      TOURNIQUET TIME: * Missing tourniquet times found for documented tourniquets in log:  [id number] *    COMPLICATIONS:  None   CONDITION:  stable  PROCEDURE IN DETAIL: [id number]   Erica Mcdaniel 12/08/2012, 12:41 PM

## 2012-12-08 NOTE — Progress Notes (Signed)
Orthopedic Tech Progress Note Patient Details:  Erica Mcdaniel Mar 13, 1970 [id number]  Ortho Devices Type of Ortho Device: Arm sling Ortho Device/Splint Interventions: Application   Cammer, Mickie Bail 12/08/2012, 2:26 PM

## 2012-12-08 NOTE — Transfer of Care (Signed)
Immediate Anesthesia Transfer of Care Note  Patient: Zoey Bidwell  Procedure(s) Performed: Procedure(s) with comments: ELBOW TENDON RECONSTRUCTION/Right Lateral Epicondylar Debridement with Repair. (Right) - Right Lateral Epicondylar Debridement with Repair.  Patient Location: PACU  Anesthesia Type:General  Level of Consciousness: awake and alert   Airway & Oxygen Therapy: Patient Spontanous Breathing and Patient connected to face mask oxygen  Post-op Assessment: Report given to PACU RN and Post -op Vital signs reviewed and stable  Post vital signs: Reviewed and stable  Complications: No apparent anesthesia complications

## 2012-12-08 NOTE — H&P (Signed)
  The recent History & Physical has been reviewed. I have personally examined the patient today. There is no interval change to the documented History & Physical. The patient would like to proceed with the procedure.  Erica Mcdaniel W 12/08/2012,  11:09 AM

## 2012-12-08 NOTE — Preoperative (Signed)
Beta Blockers   Reason not to administer Beta Blockers:Not Applicable 

## 2012-12-09 ENCOUNTER — Encounter (HOSPITAL_COMMUNITY): Payer: Self-pay | Admitting: Orthopaedic Surgery

## 2012-12-11 NOTE — Op Note (Signed)
Erica Mcdaniel, Erica Mcdaniel           ACCOUNT NO.:  [id number]  MEDICAL RECORD NO.:  [id number]  LOCATION:  [facility]                         FACILITY:  [facility]  PHYSICIAN:  Claude Manges. Aiza Vollrath, M.D.DATE OF BIRTH:  Jul 10, 1970  DATE OF PROCEDURE:  12/08/2012 DATE OF DISCHARGE:  12/08/2012                              OPERATIVE REPORT   PREOPERATIVE DIAGNOSIS:  Lateral epicondylitis (tennis elbow), right elbow.  POSTOPERATIVE DIAGNOSIS:  Lateral epicondylitis (tennis elbow), right elbow.  PROCEDURE:  Expiration of lateral aspect, right elbow with release of right tennis elbow and partial lateral epicondylectomy.  SURGEON:  Claude Manges. Cleophas Dunker, MD  ASSISTANT:  Jacqualine Code PA-C.  ANESTHESIA:  General.  COMPLICATIONS:  None.  HISTORY:  A 42 year old female has had history of recurrent pain over period of months in her lateral right dominant elbow.  She has had local cortisone injections in the area of the lateral epicondyle with complete relief of her pain, only to have it recur.  She has had an MRI scan consistent with lateral epicondylitis and now wishes to proceed with surgical release.  PROCEDURE IN DETAIL:  Ms. Stare was met in the holding area, identified the right elbow as appropriate operative site.  Answered any questions.  She was then transported to room #7, placed under general laryngeal anesthesia without difficulty.  The right upper extremity was then prepped with a DuraPrep from the wrist to the axilla.  Sterile draping was performed.  With the extremity elevated, a sterile arm tourniquet was applied and then the right upper extremity was Esmarch exsanguinated with a proximal tourniquet at 250 mmHg.  A skin incision was outlined with a marking pencil approximately an inch and a half in length and centered over the lateral epicondyle.  A slightly curvilinear incision was then made with a 15 blade knife and carried down to subcutaneous tissue and a moderate amount of  adipose tissue.  This was released.  This was dissected bluntly.  A self- retaining retractor was inserted.  The lateral epicondyle was identified.  There was a raphae between the ECRL and ECRB and the extensor common extensors and this was incised.  I then elevated the ECRB, ECRL complex from the lateral epicondyle.  There were some areas of degenerative tendon.  These were debrided sharply.  There was prominence of the lateral epicondyles.  We performed a partial lateral epicondylectomy with a rongeur.  I also made a small window in the joint.  There was 2 or 3 mL clear yellow joint effusion.  There did not appear to be any pathology along the joint surface.  No loose bodies. The wound was irrigated.  Several drill holes were then made in the lateral epicondyle, and I inserted a single Biomet JuggerKnot short rigid size 1 anchor with attached #1 polyethylene/polyester suture. Then, anatomically reattached the ECRL and ECRB complex to the common extensor tendon, and then ran the same suture to complete the repair proximally and distally.  The wound was irrigated with saline solution. The subcu was closed with 3-0 Monocryl.  Skin closed with Steri-Strips over benzoin.  A neutral sterile bulky dressing was applied followed by a posterior splint, and an Ace bandage, and a sling.  Tourniquet was  deflated with immediate capillary refill to the fingers.  The patient tolerated the procedure without complications.  We will plan to send her home on Percocet for pain.  Check her in the office in 5 days.     Claude Manges. Cleophas Dunker, M.D.     PWW/MEDQ  D:  12/08/2012  T:  12/09/2012  Job:  [id number]

## 2013-10-06 ENCOUNTER — Other Ambulatory Visit: Payer: Self-pay | Admitting: Physician Assistant

## 2013-10-06 DIAGNOSIS — Z1231 Encounter for screening mammogram for malignant neoplasm of breast: Secondary | ICD-10-CM

## 2013-11-03 ENCOUNTER — Ambulatory Visit: Payer: Medicare Other

## 2013-11-18 ENCOUNTER — Ambulatory Visit: Payer: Medicare Other

## 2013-12-01 DIAGNOSIS — N938 Other specified abnormal uterine and vaginal bleeding: Secondary | ICD-10-CM | POA: Insufficient documentation

## 2013-12-01 DIAGNOSIS — H9209 Otalgia, unspecified ear: Secondary | ICD-10-CM | POA: Insufficient documentation

## 2013-12-03 ENCOUNTER — Ambulatory Visit
Admission: RE | Admit: 2013-12-03 | Discharge: 2013-12-03 | Disposition: A | Discharge status: Home / Self Care | Payer: Medicaid Other | Source: Ambulatory Visit | Attending: Physician Assistant | Admitting: Physician Assistant

## 2013-12-03 DIAGNOSIS — Z1231 Encounter for screening mammogram for malignant neoplasm of breast: Secondary | ICD-10-CM

## 2014-02-09 DIAGNOSIS — K219 Gastro-esophageal reflux disease without esophagitis: Secondary | ICD-10-CM | POA: Diagnosis not present

## 2014-02-09 DIAGNOSIS — R0683 Snoring: Secondary | ICD-10-CM | POA: Diagnosis not present

## 2014-02-09 DIAGNOSIS — J019 Acute sinusitis, unspecified: Secondary | ICD-10-CM | POA: Diagnosis not present

## 2014-04-29 DIAGNOSIS — I10 Essential (primary) hypertension: Secondary | ICD-10-CM | POA: Diagnosis not present

## 2014-04-29 DIAGNOSIS — E559 Vitamin D deficiency, unspecified: Secondary | ICD-10-CM | POA: Diagnosis not present

## 2014-04-29 DIAGNOSIS — M545 Low back pain: Secondary | ICD-10-CM | POA: Diagnosis not present

## 2014-04-29 DIAGNOSIS — Z981 Arthrodesis status: Secondary | ICD-10-CM | POA: Diagnosis not present

## 2014-04-29 DIAGNOSIS — R202 Paresthesia of skin: Secondary | ICD-10-CM | POA: Diagnosis not present

## 2014-04-29 DIAGNOSIS — S3993XA Unspecified injury of pelvis, initial encounter: Secondary | ICD-10-CM | POA: Diagnosis not present

## 2014-04-29 DIAGNOSIS — F418 Other specified anxiety disorders: Secondary | ICD-10-CM | POA: Diagnosis not present

## 2014-04-29 DIAGNOSIS — S3992XA Unspecified injury of lower back, initial encounter: Secondary | ICD-10-CM | POA: Diagnosis not present

## 2014-04-29 DIAGNOSIS — M797 Fibromyalgia: Secondary | ICD-10-CM | POA: Diagnosis not present

## 2014-04-29 DIAGNOSIS — G8929 Other chronic pain: Secondary | ICD-10-CM | POA: Diagnosis not present

## 2014-04-29 DIAGNOSIS — Z124 Encounter for screening for malignant neoplasm of cervix: Secondary | ICD-10-CM | POA: Diagnosis not present

## 2014-04-29 DIAGNOSIS — J309 Allergic rhinitis, unspecified: Secondary | ICD-10-CM | POA: Diagnosis not present

## 2014-06-02 ENCOUNTER — Encounter: Payer: Self-pay | Admitting: Gastroenterology

## 2014-07-14 DIAGNOSIS — I10 Essential (primary) hypertension: Secondary | ICD-10-CM | POA: Diagnosis not present

## 2014-07-14 DIAGNOSIS — H5210 Myopia, unspecified eye: Secondary | ICD-10-CM | POA: Diagnosis not present

## 2014-10-12 DIAGNOSIS — S0083XA Contusion of other part of head, initial encounter: Secondary | ICD-10-CM | POA: Diagnosis not present

## 2014-10-12 DIAGNOSIS — F329 Major depressive disorder, single episode, unspecified: Secondary | ICD-10-CM | POA: Diagnosis not present

## 2014-10-12 DIAGNOSIS — T7611XA Adult physical abuse, suspected, initial encounter: Secondary | ICD-10-CM | POA: Diagnosis not present

## 2014-10-12 DIAGNOSIS — S0592XA Unspecified injury of left eye and orbit, initial encounter: Secondary | ICD-10-CM | POA: Diagnosis not present

## 2014-10-12 DIAGNOSIS — H538 Other visual disturbances: Secondary | ICD-10-CM | POA: Diagnosis not present

## 2014-10-12 DIAGNOSIS — F17210 Nicotine dependence, cigarettes, uncomplicated: Secondary | ICD-10-CM | POA: Diagnosis not present

## 2014-10-12 DIAGNOSIS — S60211A Contusion of right wrist, initial encounter: Secondary | ICD-10-CM | POA: Diagnosis not present

## 2014-10-12 DIAGNOSIS — I10 Essential (primary) hypertension: Secondary | ICD-10-CM | POA: Diagnosis not present

## 2014-10-12 DIAGNOSIS — S2020XA Contusion of thorax, unspecified, initial encounter: Secondary | ICD-10-CM | POA: Diagnosis not present

## 2014-10-12 DIAGNOSIS — S0012XA Contusion of left eyelid and periocular area, initial encounter: Secondary | ICD-10-CM | POA: Diagnosis not present

## 2014-12-02 DIAGNOSIS — Z1389 Encounter for screening for other disorder: Secondary | ICD-10-CM | POA: Diagnosis not present

## 2014-12-02 DIAGNOSIS — E785 Hyperlipidemia, unspecified: Secondary | ICD-10-CM | POA: Diagnosis not present

## 2014-12-02 DIAGNOSIS — F17200 Nicotine dependence, unspecified, uncomplicated: Secondary | ICD-10-CM | POA: Diagnosis not present

## 2014-12-02 DIAGNOSIS — M545 Low back pain: Secondary | ICD-10-CM | POA: Diagnosis not present

## 2014-12-02 DIAGNOSIS — Z23 Encounter for immunization: Secondary | ICD-10-CM | POA: Diagnosis not present

## 2014-12-02 DIAGNOSIS — K219 Gastro-esophageal reflux disease without esophagitis: Secondary | ICD-10-CM | POA: Diagnosis not present

## 2014-12-02 DIAGNOSIS — F418 Other specified anxiety disorders: Secondary | ICD-10-CM | POA: Diagnosis not present

## 2014-12-02 DIAGNOSIS — I10 Essential (primary) hypertension: Secondary | ICD-10-CM | POA: Diagnosis not present

## 2014-12-02 DIAGNOSIS — E559 Vitamin D deficiency, unspecified: Secondary | ICD-10-CM | POA: Diagnosis not present

## 2015-01-04 DIAGNOSIS — Z6835 Body mass index (BMI) 35.0-35.9, adult: Secondary | ICD-10-CM | POA: Diagnosis not present

## 2015-01-04 DIAGNOSIS — L600 Ingrowing nail: Secondary | ICD-10-CM | POA: Diagnosis not present

## 2015-06-07 DIAGNOSIS — Z0000 Encounter for general adult medical examination without abnormal findings: Secondary | ICD-10-CM | POA: Diagnosis not present

## 2015-06-07 DIAGNOSIS — K589 Irritable bowel syndrome without diarrhea: Secondary | ICD-10-CM | POA: Diagnosis not present

## 2015-06-07 DIAGNOSIS — K219 Gastro-esophageal reflux disease without esophagitis: Secondary | ICD-10-CM | POA: Diagnosis not present

## 2015-06-07 DIAGNOSIS — F418 Other specified anxiety disorders: Secondary | ICD-10-CM | POA: Diagnosis not present

## 2015-06-07 DIAGNOSIS — E785 Hyperlipidemia, unspecified: Secondary | ICD-10-CM | POA: Diagnosis not present

## 2015-06-07 DIAGNOSIS — G8929 Other chronic pain: Secondary | ICD-10-CM | POA: Diagnosis not present

## 2015-06-07 DIAGNOSIS — E559 Vitamin D deficiency, unspecified: Secondary | ICD-10-CM | POA: Diagnosis not present

## 2015-06-07 DIAGNOSIS — Z1389 Encounter for screening for other disorder: Secondary | ICD-10-CM | POA: Diagnosis not present

## 2015-06-07 DIAGNOSIS — I10 Essential (primary) hypertension: Secondary | ICD-10-CM | POA: Diagnosis not present

## 2015-06-07 DIAGNOSIS — Z1231 Encounter for screening mammogram for malignant neoplasm of breast: Secondary | ICD-10-CM | POA: Diagnosis not present

## 2015-06-13 ENCOUNTER — Other Ambulatory Visit: Payer: Self-pay | Admitting: Physician Assistant

## 2015-06-13 DIAGNOSIS — Z1231 Encounter for screening mammogram for malignant neoplasm of breast: Secondary | ICD-10-CM

## 2015-06-15 ENCOUNTER — Ambulatory Visit
Admission: RE | Admit: 2015-06-15 | Discharge: 2015-06-15 | Disposition: A | Discharge status: Home / Self Care | Payer: Medicaid Other | Source: Ambulatory Visit | Attending: Physician Assistant | Admitting: Physician Assistant

## 2015-06-15 DIAGNOSIS — Z1231 Encounter for screening mammogram for malignant neoplasm of breast: Secondary | ICD-10-CM | POA: Diagnosis not present

## 2015-07-31 DIAGNOSIS — L255 Unspecified contact dermatitis due to plants, except food: Secondary | ICD-10-CM | POA: Diagnosis not present

## 2015-07-31 DIAGNOSIS — Z6838 Body mass index (BMI) 38.0-38.9, adult: Secondary | ICD-10-CM | POA: Diagnosis not present

## 2015-08-21 DIAGNOSIS — Z8614 Personal history of Methicillin resistant Staphylococcus aureus infection: Secondary | ICD-10-CM | POA: Diagnosis not present

## 2015-08-21 DIAGNOSIS — R509 Fever, unspecified: Secondary | ICD-10-CM | POA: Diagnosis not present

## 2015-08-21 DIAGNOSIS — F17200 Nicotine dependence, unspecified, uncomplicated: Secondary | ICD-10-CM | POA: Diagnosis not present

## 2015-08-21 DIAGNOSIS — I10 Essential (primary) hypertension: Secondary | ICD-10-CM | POA: Diagnosis not present

## 2015-08-21 DIAGNOSIS — L0291 Cutaneous abscess, unspecified: Secondary | ICD-10-CM | POA: Diagnosis not present

## 2015-08-21 DIAGNOSIS — L02211 Cutaneous abscess of abdominal wall: Secondary | ICD-10-CM | POA: Diagnosis not present

## 2015-08-21 DIAGNOSIS — R197 Diarrhea, unspecified: Secondary | ICD-10-CM | POA: Diagnosis not present

## 2015-08-21 DIAGNOSIS — M1990 Unspecified osteoarthritis, unspecified site: Secondary | ICD-10-CM | POA: Diagnosis not present

## 2015-08-21 DIAGNOSIS — R110 Nausea: Secondary | ICD-10-CM | POA: Diagnosis not present

## 2015-08-25 DIAGNOSIS — B373 Candidiasis of vulva and vagina: Secondary | ICD-10-CM | POA: Diagnosis not present

## 2015-08-25 DIAGNOSIS — L02211 Cutaneous abscess of abdominal wall: Secondary | ICD-10-CM | POA: Diagnosis not present

## 2015-08-25 DIAGNOSIS — F419 Anxiety disorder, unspecified: Secondary | ICD-10-CM | POA: Diagnosis not present

## 2015-08-25 DIAGNOSIS — Z6838 Body mass index (BMI) 38.0-38.9, adult: Secondary | ICD-10-CM | POA: Diagnosis not present

## 2015-08-31 ENCOUNTER — Encounter: Payer: Self-pay | Admitting: Internal Medicine

## 2015-12-11 DIAGNOSIS — E559 Vitamin D deficiency, unspecified: Secondary | ICD-10-CM | POA: Diagnosis not present

## 2015-12-11 DIAGNOSIS — F418 Other specified anxiety disorders: Secondary | ICD-10-CM | POA: Diagnosis not present

## 2015-12-11 DIAGNOSIS — G8929 Other chronic pain: Secondary | ICD-10-CM | POA: Diagnosis not present

## 2015-12-11 DIAGNOSIS — Z23 Encounter for immunization: Secondary | ICD-10-CM | POA: Diagnosis not present

## 2015-12-11 DIAGNOSIS — R1011 Right upper quadrant pain: Secondary | ICD-10-CM | POA: Diagnosis not present

## 2015-12-11 DIAGNOSIS — K589 Irritable bowel syndrome without diarrhea: Secondary | ICD-10-CM | POA: Diagnosis not present

## 2015-12-11 DIAGNOSIS — I10 Essential (primary) hypertension: Secondary | ICD-10-CM | POA: Diagnosis not present

## 2015-12-11 DIAGNOSIS — Z6839 Body mass index (BMI) 39.0-39.9, adult: Secondary | ICD-10-CM | POA: Diagnosis not present

## 2015-12-11 DIAGNOSIS — K219 Gastro-esophageal reflux disease without esophagitis: Secondary | ICD-10-CM | POA: Diagnosis not present

## 2015-12-15 ENCOUNTER — Encounter: Payer: Self-pay | Admitting: Internal Medicine

## 2015-12-23 DIAGNOSIS — R1011 Right upper quadrant pain: Secondary | ICD-10-CM | POA: Diagnosis not present

## 2016-01-08 DIAGNOSIS — R112 Nausea with vomiting, unspecified: Secondary | ICD-10-CM | POA: Diagnosis not present

## 2016-01-08 DIAGNOSIS — R1011 Right upper quadrant pain: Secondary | ICD-10-CM | POA: Diagnosis not present

## 2016-01-29 DIAGNOSIS — D126 Benign neoplasm of colon, unspecified: Secondary | ICD-10-CM

## 2016-01-29 HISTORY — DX: Benign neoplasm of colon, unspecified: D12.6

## 2016-02-09 ENCOUNTER — Ambulatory Visit (AMBULATORY_SURGERY_CENTER): Payer: Self-pay | Admitting: *Deleted

## 2016-02-09 VITALS — Ht 68.0 in | Wt 257.2 lb

## 2016-02-09 DIAGNOSIS — Z86010 Personal history of colonic polyps: Secondary | ICD-10-CM

## 2016-02-09 MED ORDER — NA SULFATE-K SULFATE-MG SULF 17.5-3.13-1.6 GM/177ML PO SOLN: 1.0000 | Freq: Once | ORAL | 0 refills | Status: AC

## 2016-02-09 NOTE — Progress Notes (Signed)
No allergies to eggs or soy. No problems with anesthesia.  Pt given Emmi instructions for colonoscopy  No oxygen use  No diet drug use  

## 2016-02-12 ENCOUNTER — Encounter: Payer: Self-pay | Admitting: Internal Medicine

## 2016-02-21 ENCOUNTER — Ambulatory Visit (AMBULATORY_SURGERY_CENTER): Payer: Medicare HMO | Admitting: Internal Medicine

## 2016-02-21 ENCOUNTER — Encounter: Payer: Self-pay | Admitting: Internal Medicine

## 2016-02-21 VITALS — BP 147/92 | HR 72 | Temp 98.2°F | Resp 10 | Ht 68.0 in | Wt 257.0 lb

## 2016-02-21 DIAGNOSIS — D124 Benign neoplasm of descending colon: Secondary | ICD-10-CM | POA: Diagnosis not present

## 2016-02-21 DIAGNOSIS — R69 Illness, unspecified: Secondary | ICD-10-CM | POA: Diagnosis not present

## 2016-02-21 DIAGNOSIS — D125 Benign neoplasm of sigmoid colon: Secondary | ICD-10-CM | POA: Diagnosis not present

## 2016-02-21 DIAGNOSIS — Z1211 Encounter for screening for malignant neoplasm of colon: Secondary | ICD-10-CM

## 2016-02-21 DIAGNOSIS — M797 Fibromyalgia: Secondary | ICD-10-CM | POA: Diagnosis not present

## 2016-02-21 DIAGNOSIS — D123 Benign neoplasm of transverse colon: Secondary | ICD-10-CM | POA: Diagnosis not present

## 2016-02-21 DIAGNOSIS — Z86010 Personal history of colonic polyps: Secondary | ICD-10-CM | POA: Diagnosis not present

## 2016-02-21 DIAGNOSIS — K529 Noninfective gastroenteritis and colitis, unspecified: Secondary | ICD-10-CM | POA: Diagnosis not present

## 2016-02-21 DIAGNOSIS — I10 Essential (primary) hypertension: Secondary | ICD-10-CM | POA: Diagnosis not present

## 2016-02-21 DIAGNOSIS — K635 Polyp of colon: Secondary | ICD-10-CM | POA: Diagnosis not present

## 2016-02-21 DIAGNOSIS — K219 Gastro-esophageal reflux disease without esophagitis: Secondary | ICD-10-CM | POA: Diagnosis not present

## 2016-02-21 MED ORDER — SODIUM CHLORIDE 0.9 % IV SOLN: 500.0000 mL | INTRAVENOUS | Status: DC

## 2016-02-21 NOTE — Patient Instructions (Signed)
AWAIT PATHOLOGY RESULTS.  APPOINTMENT WITH DR. PYRTLE AT THE NEXT AVAILABLE APPOINTMENT.  HANDOUTS GIVEN: POLYPS, HEMORRHOIDS.    YOU HAD AN ENDOSCOPIC PROCEDURE TODAY AT [facility]:   Refer to the procedure report that was given to you for any specific questions about what was found during the examination.  If the procedure report does not answer your questions, please call your gastroenterologist to clarify.  If you requested that your care partner not be given the details of your procedure findings, then the procedure report has been included in a sealed envelope for you to review at your convenience later.  YOU SHOULD EXPECT: Some feelings of bloating in the abdomen. Passage of more gas than usual.  Walking can help get rid of the air that was put into your GI tract during the procedure and reduce the bloating. If you had a lower endoscopy (such as a colonoscopy or flexible sigmoidoscopy) you may notice spotting of blood in your stool or on the toilet paper. If you underwent a bowel prep for your procedure, you may not have a normal bowel movement for a few days.  Please Note:  You might notice some irritation and congestion in your nose or some drainage.  This is from the oxygen used during your procedure.  There is no need for concern and it should clear up in a day or so.  SYMPTOMS TO REPORT IMMEDIATELY:   Following lower endoscopy (colonoscopy or flexible sigmoidoscopy):  Excessive amounts of blood in the stool  Significant tenderness or worsening of abdominal pains  Swelling of the abdomen that is new, acute  Fever of 100F or higher   For urgent or emergent issues, a gastroenterologist can be reached at any hour by calling [phone].   DIET:  We do recommend a small meal at first, but then you may proceed to your regular diet.  Drink plenty of fluids but you should avoid alcoholic beverages for 24 hours.  ACTIVITY:  You should plan to take it easy for the  rest of today and you should NOT DRIVE or use heavy machinery until tomorrow (because of the sedation medicines used during the test).    FOLLOW UP: Our staff will call the number listed on your records the next business day following your procedure to check on you and address any questions or concerns that you may have regarding the information given to you following your procedure. If we do not reach you, we will leave a message.  However, if you are feeling well and you are not experiencing any problems, there is no need to return our call.  We will assume that you have returned to your regular daily activities without incident.  If any biopsies were taken you will be contacted by phone or by letter within the next 1-3 weeks.  Please call us at [phone] if you have not heard about the biopsies in 3 weeks.    SIGNATURES/CONFIDENTIALITY: You and/or your care partner have signed paperwork which will be entered into your electronic medical record.  These signatures attest to the fact that that the information above on your After Visit Summary has been reviewed and is understood.  Full responsibility of the confidentiality of this discharge information lies with you and/or your care-partner.

## 2016-02-21 NOTE — Progress Notes (Signed)
Called to room to assist during endoscopic procedure.  Patient ID and intended procedure confirmed with present staff. Received instructions for my participation in the procedure from the performing physician.  

## 2016-02-21 NOTE — Op Note (Signed)
[facility] Patient Name: Erica Mcdaniel Procedure Date: 02/21/2016 10:24 AM MRN: [id number] Endoscopist: Jerene Bears , MD Age: 46 Referring MD:  Date of Birth: [birth year] Gender: Female Account #: [id number] Procedure:                Colonoscopy Indications:              High risk colon cancer surveillance: Personal                            history of colonic polyps, Last colonoscopy 5 years                            ago, also episodic RUQ pain, post-prandial diarrhea                            x 4 months Medicines:                Monitored Anesthesia Care Procedure:                Pre-Anesthesia Assessment:                           - Prior to the procedure, a History and Physical                            was performed, and patient medications and                            allergies were reviewed. The patient's tolerance of                            previous anesthesia was also reviewed. The risks                            and benefits of the procedure and the sedation                            options and risks were discussed with the patient.                            All questions were answered, and informed consent                            was obtained. Prior Anticoagulants: The patient has                            taken no previous anticoagulant or antiplatelet                            agents. ASA Grade Assessment: III - A patient with                            severe systemic disease. After reviewing the risks  and benefits, the patient was deemed in                            satisfactory condition to undergo the procedure.                           After obtaining informed consent, the colonoscope                            was passed under direct vision. Throughout the                            procedure, the patient's blood pressure, pulse, and                            oxygen saturations were monitored  continuously. The                            Model CF-HQ190L [id number]) scope was introduced                            through the anus and advanced to the the terminal                            ileum. The colonoscopy was performed without                            difficulty. The patient tolerated the procedure                            well. The quality of the bowel preparation was                            good. The terminal ileum, ileocecal valve,                            appendiceal orifice, and rectum were photographed. Scope In: 10:44:36 AM Scope Out: 11:10:48 AM Scope Withdrawal Time: 0 hours 20 minutes 49 seconds  Total Procedure Duration: 0 hours 26 minutes 12 seconds  Findings:                 The perianal exam findings include skin tags.                           The terminal ileum appeared normal (lymphoid                            hyperplasia/nodules seen in TI). Multiple biopsies                            were obtained with cold forceps for histology in                            the terminal ileum.  A 6 mm polyp was found in the hepatic flexure. The                            polyp was sessile. The polyp was removed with a                            cold snare. Resection and retrieval were complete.                           Two sessile polyps were found in the transverse                            colon. The polyps were 4 to 6 mm in size. These                            polyps were removed with a cold snare. Resection                            and retrieval were complete.                           Two sessile polyps were found in the descending                            colon. The polyps were 4 to 5 mm in size. These                            polyps were removed with a cold snare. Resection                            and retrieval were complete.                           Three sessile polyps were found in the sigmoid                             colon. The polyps were 3 to 6 mm in size. These                            polyps were removed with a cold snare. Resection                            and retrieval were complete.                           Otherwise normal mucosa was found in the entire                            colon. Biopsies for histology were taken with a                            cold forceps from the right colon and left  colon                            for evaluation of microscopic colitis.                           Internal hemorrhoids were found during                            retroflexion. The hemorrhoids were small. Complications:            No immediate complications. Estimated Blood Loss:     Estimated blood loss was minimal. Impression:               - Perianal skin tags found on perianal exam.                           - The examined portion of the ileum was normal.                            Biopsied.                           - One 6 mm polyp at the hepatic flexure, removed                            with a cold snare. Resected and retrieved.                           - Two 4 to 6 mm polyps in the transverse colon,                            removed with a cold snare. Resected and retrieved.                           - Two 4 to 5 mm polyps in the descending colon,                            removed with a cold snare. Resected and retrieved.                           - Three 3 to 6 mm polyps in the sigmoid colon,                            removed with a cold snare. Resected and retrieved.                           - Otherwise normal mucosa in the entire examined                            colon. Biopsied.                           - Internal hemorrhoids. Recommendation:           - Patient  has a contact number available for                            emergencies. The signs and symptoms of potential                            delayed complications were discussed with the                             patient. Return to normal activities tomorrow.                            Written discharge instructions were provided to the                            patient.                           - Resume previous diet.                           - Continue present medications.                           - Await pathology results.                           - Repeat colonoscopy is recommended. The                            colonoscopy date will be determined after pathology                            results from today's exam become available for                            review.                           - Return to GI office at the next available                            appointment. Jerene Bears, MD 02/21/2016 11:19:03 AM This report has been signed electronically.

## 2016-02-21 NOTE — Progress Notes (Signed)
Report to PACU, RN, vss, BBS= Clear.  

## 2016-02-22 ENCOUNTER — Telehealth: Payer: Self-pay

## 2016-02-22 NOTE — Telephone Encounter (Signed)
Left message on answering machine. 

## 2016-02-23 ENCOUNTER — Telehealth: Payer: Self-pay

## 2016-02-23 NOTE — Telephone Encounter (Signed)
  Follow up Call-  Call back number 02/21/2016  Post procedure Call Back phone  # [phone]  Permission to leave phone message Yes  Some recent data might be hidden     Patient questions:  Do you have a fever, pain , or abdominal swelling? No. Pain Score  0 *  Have you tolerated food without any problems? Yes.    Have you been able to return to your normal activities? Yes.    Do you have any questions about your discharge instructions: Diet   No. Medications  No. Follow up visit  No.  Do you have questions or concerns about your Care? No.  Actions: * If pain score is 4 or above: No action needed, pain <4.

## 2016-02-26 ENCOUNTER — Telehealth: Payer: Self-pay | Admitting: Internal Medicine

## 2016-02-26 NOTE — Telephone Encounter (Signed)
Offered pt an appt with NP tomorrow but pt states she is on disability and cannot afford to come twice to be seen. States she does not know what the NP could do anyway. Pt requests first available visit with Dr. Hilarie Mcdaniel. Pt scheduled to see Dr. Hilarie Mcdaniel 04/15/16@2 :30pm. Pt aware of appt.

## 2016-02-26 NOTE — Telephone Encounter (Signed)
Would get her an APP visit and then a visit with me next available The APP visit can clarify acute symptoms and help decide how to proceed

## 2016-02-26 NOTE — Telephone Encounter (Signed)
Pt states she has been sick and vomiting her guts up for 4 months. States Dr. Hilarie Fredrickson told her to call this morning to be seen this week for an OV. Per procedure report it states she needs an OV at the next available appt. Pt states that is not what she was told and she wants to see Dr. Hilarie Fredrickson this week. Pt is not having any new problems from the procedure. Please advise.

## 2016-02-27 ENCOUNTER — Ambulatory Visit: Payer: Medicare HMO | Admitting: Nurse Practitioner

## 2016-02-29 ENCOUNTER — Encounter: Payer: Self-pay | Admitting: Internal Medicine

## 2016-04-05 ENCOUNTER — Encounter: Payer: Self-pay | Admitting: *Deleted

## 2016-04-15 ENCOUNTER — Encounter (INDEPENDENT_AMBULATORY_CARE_PROVIDER_SITE_OTHER): Payer: Self-pay

## 2016-04-15 ENCOUNTER — Ambulatory Visit (INDEPENDENT_AMBULATORY_CARE_PROVIDER_SITE_OTHER): Payer: Medicare HMO | Admitting: Internal Medicine

## 2016-04-15 ENCOUNTER — Encounter: Payer: Self-pay | Admitting: Internal Medicine

## 2016-04-15 ENCOUNTER — Other Ambulatory Visit (INDEPENDENT_AMBULATORY_CARE_PROVIDER_SITE_OTHER): Payer: Medicare HMO

## 2016-04-15 VITALS — BP 148/96 | HR 78 | Resp 18 | Ht 68.0 in | Wt 265.0 lb

## 2016-04-15 DIAGNOSIS — K580 Irritable bowel syndrome with diarrhea: Secondary | ICD-10-CM

## 2016-04-15 DIAGNOSIS — R195 Other fecal abnormalities: Secondary | ICD-10-CM

## 2016-04-15 DIAGNOSIS — R112 Nausea with vomiting, unspecified: Secondary | ICD-10-CM

## 2016-04-15 DIAGNOSIS — R1011 Right upper quadrant pain: Secondary | ICD-10-CM

## 2016-04-15 LAB — COMPREHENSIVE METABOLIC PANEL
ALT: 14 U/L (ref 0–35)
AST: 14 U/L (ref 0–37)
Albumin: 4.2 g/dL (ref 3.5–5.2)
Alkaline Phosphatase: 69 U/L (ref 39–117)
BUN: 7 mg/dL (ref 6–23)
CHLORIDE: 104 meq/L (ref 96–112)
CO2: 28 mEq/L (ref 19–32)
Calcium: 9.7 mg/dL (ref 8.4–10.5)
Creatinine, Ser: 0.66 mg/dL (ref 0.40–1.20)
GFR: 101.59 mL/min (ref >60.00)
GLUCOSE: 94 mg/dL (ref 70–99)
Potassium: 4 mEq/L (ref 3.5–5.1)
Sodium: 138 mEq/L (ref 135–145)
Total Bilirubin: 0.3 mg/dL (ref 0.2–1.2)
Total Protein: 7 g/dL (ref 6.0–8.3)

## 2016-04-15 LAB — CBC WITH DIFFERENTIAL/PLATELET
BASOS PCT: 1.1 % (ref 0.0–3.0)
Basophils Absolute: 0.1 10*3/uL (ref 0.0–0.1)
EOS PCT: 5.4 % — AB (ref 0.0–5.0)
Eosinophils Absolute: 0.5 10*3/uL (ref 0.0–0.7)
HCT: 44.8 % (ref 36.0–46.0)
Hemoglobin: 15.2 g/dL — ABNORMAL HIGH (ref 12.0–15.0)
LYMPHS ABS: 2.9 10*3/uL (ref 0.7–4.0)
Lymphocytes Relative: 32 % (ref 12.0–46.0)
MCHC: 33.9 g/dL (ref 30.0–36.0)
MCV: 90 fl (ref 78.0–100.0)
MONO ABS: 0.4 10*3/uL (ref 0.1–1.0)
Monocytes Relative: 4.8 % (ref 3.0–12.0)
NEUTROS PCT: 56.7 % (ref 43.0–77.0)
Neutro Abs: 5.1 10*3/uL (ref 1.4–7.7)
PLATELETS: 360 10*3/uL (ref 150.0–400.0)
RBC: 4.98 Mil/uL (ref 3.87–5.11)
RDW: 13.6 % (ref 11.5–15.5)
WBC: 9.1 10*3/uL (ref 4.0–10.5)

## 2016-04-15 LAB — LIPASE: Lipase: 8 U/L — ABNORMAL LOW (ref 11.0–59.0)

## 2016-04-15 LAB — IGA: IGA: 153 mg/dL (ref 68–378)

## 2016-04-15 LAB — AMYLASE: Amylase: 19 U/L — ABNORMAL LOW (ref 27–131)

## 2016-04-15 LAB — TSH: TSH: 2.77 u[IU]/mL (ref 0.35–4.50)

## 2016-04-15 MED ORDER — ESOMEPRAZOLE MAGNESIUM 40 MG PO CPDR: 40.0000 mg | DELAYED_RELEASE_CAPSULE | Freq: Two times a day (BID) | ORAL | 3 refills | Status: DC

## 2016-04-15 MED ORDER — RIFAXIMIN 550 MG PO TABS: 550.0000 mg | ORAL_TABLET | Freq: Three times a day (TID) | ORAL | 0 refills | Status: DC

## 2016-04-15 NOTE — Patient Instructions (Addendum)
You have been scheduled for an endoscopy. Please follow written instructions given to you at your visit today. If you use inhalers (even only as needed), please bring them with you on the day of your procedure. Your physician has requested that you go to www.startemmi.com and enter the access code given to you at your visit today. This web site gives a general overview about your procedure. However, you should still follow specific instructions given to you by our office regarding your preparation for the procedure.  We have sent the following medications to your pharmacy for you to pick up at your convenience: Nexium 40 mg twice daily before meals.  Your physician has requested that you go to the basement for the following lab work before leaving today: CBC, CMP, IgA, IgA TTG, Amylase, Lipase, TSH  We have sent your demographic information and a prescription for Xifaxan to [facility]. This pharmacy is able to get medication approved through insurance and get you the lowest copay possible. If you have not heard from them within 1 week, please call our office at [phone] to let us know.  If you are age 48 or older, your body mass index should be between 23-30. Your Body mass index is 40.29 kg/m. If this is out of the aforementioned range listed, please consider follow up with your Primary Care Provider.  If you are age 15 or younger, your body mass index should be between 19-25. Your Body mass index is 40.29 kg/m. If this is out of the aformentioned range listed, please consider follow up with your Primary Care Provider.

## 2016-04-15 NOTE — Progress Notes (Signed)
Subjective:    Patient ID: Erica Mcdaniel, female    DOB: 1970-04-11, 46 y.o.   MRN: [id number]  HPI Erica Mcdaniel is a 46 year old female with a past medical history of GERD, IBS, adenomatous colon polyps, rheumatoid arthritis, polycystic ovarian syndrome, hypertension and depression who is here to evaluate right upper quadrant abdominal pain, nausea, vomiting, GERD and chronic loose stools with abdominal bloating.  She has not been seen in the office in many years but came for surveillance colonoscopy which I performed on 02/21/2016. This revealed multiple colon polyps, all less than 6 mm which were removed with cold snare. She had internal hemorrhoids. Random biopsies were performed for chronic loose stools. Polyps were of mixed tight both hyperplastic, sessile serrated polyp and tubular adenoma. Random biopsies from the terminal ileum and colon were normal.  Her biggest complaint recently is severe right upper quadrant abdominal pain which radiates to her back. This is worse with eating. It is associated with nausea and vomiting. She has a history of reflux and has been taking Nexium 40 mg at bedtime. This has resolved a lot of the acid reflux and heartburn symptoms but has not helped the right upper quadrant pain nausea or vomiting. At times she has both solid and liquid dysphagia as well. Right upper quadrant pain can be a constant nagging pain but also a squeezing or stabbing sharp pain radiating through to her back and right shoulder. From this pain she has frequent loose stools and bloating. Stools are almost never formed. They're nonbloody and non-melenic. They occur 2-3 times per day. She uses Bentyl in the evening and at bedtime which helps though it makes her sleepy. She's not had weight loss though she had lost about 40 pounds between 2016 2017 but since September has gained most of this back.  She has a complicated past surgical history and has had multiple joint surgeries  related to her rheumatoid arthritis. She also has a history of polycystic ovary and this had prior uterine ablation as well as ovarian cyst removed and the lysis of adhesions in 1997.  She had abdominal ultrasound and HIDA scan with [facility] in late 2017 ordered by primary care [facility]. She reports these as normal.  She is frustrated and tearful with her symptoms and ongoing troubles.  She sees Dr. Odella Aquas from gynecology but needs a referral from primary care per her insurance before she can follow-up with GYN.   Review of Systems As per history of present illness, otherwise negative  Current Medications, Allergies, Past Medical History, Past Surgical History, Family History and Social History were reviewed in [employer] record.     Objective:   Physical Exam BP (!) 148/96   Pulse 78   Resp 18   Ht 5\' 8"  (1.727 m)   Wt 265 lb (120.2 kg)   LMP 03/18/2016 Comment: Ablation  BMI 40.29 kg/m  Constitutional: Well-developed and well-nourished. No distress. HEENT: Normocephalic and atraumatic.Conjunctivae are normal.  No scleral icterus. Neck: Neck supple. Trachea midline. Cardiovascular: Normal rate, regular rhythm and intact distal pulses. No M/R/G Pulmonary/chest: Effort normal and breath sounds normal. No wheezing, rales or rhonchi. Abdominal: Soft, Right upper quadrant tenderness without rebound or guarding, obese, Bowel sounds active throughout.  Extremities: no clubbing, cyanosis, or edema Neurological: Alert and oriented to person place and time. Skin: Skin is warm and dry. No rashes noted. Psychiatric: Normal mood and affect. Behavior is normal.  Abd [place] and HIDA scan reviewed via  CareEverywhere -- normal, both     Assessment & Plan:  46 year old female with a past medical history of GERD, IBS, adenomatous colon polyps, rheumatoid arthritis, polycystic ovarian syndrome, hypertension and depression who is here to evaluate right upper quadrant abdominal  pain, nausea, vomiting, GERD and chronic loose stools with abdominal bloating.  1. RUQ pain/nausea/vomiting/Intermittent dysphagia -- I'm suspicious for gallbladder type pain despite her negative ultrasound and HIDA scan. Cannot exclude gastritis and peptic ulcer disease. Increase Nexium to 40 mg twice a day before meals. Proceed with EGD to rule out ulcer disease, H. pylori, gastroduodenitis. We discussed the risks, benefits and alternatives and she wishes to proceed.  2. IBS-D -- colonoscopy unrevealing from perspective of diarrhea. Can continue Bentyl 10 mg in the evening and at bedtime. Trial of rifaximin 550 mg 3 times a day 14 days.  3. PCO S -- patient is to contact primary care for GYN referral as her insurance will not allow me to refer her directly  4. History of polyps -- surveillance colonoscopy recommended in 3 years If EGD unrevealing and no response to medication, consider surgical referral for cholecystectomy  25 minutes spent with the patient today. Greater than 50% was spent in counseling and coordination of care with the patient

## 2016-04-16 LAB — TISSUE TRANSGLUTAMINASE, IGA: Tissue Transglutaminase Ab, IgA: 1 U/mL (ref <4)

## 2016-04-18 ENCOUNTER — Telehealth: Payer: Self-pay | Admitting: Internal Medicine

## 2016-04-18 ENCOUNTER — Encounter: Payer: Self-pay | Admitting: Internal Medicine

## 2016-04-18 ENCOUNTER — Ambulatory Visit (AMBULATORY_SURGERY_CENTER): Payer: Medicare HMO | Admitting: Internal Medicine

## 2016-04-18 VITALS — BP 152/69 | HR 67 | Temp 97.8°F | Resp 10 | Ht 68.0 in | Wt 265.0 lb

## 2016-04-18 DIAGNOSIS — K3189 Other diseases of stomach and duodenum: Secondary | ICD-10-CM | POA: Diagnosis not present

## 2016-04-18 DIAGNOSIS — K2950 Unspecified chronic gastritis without bleeding: Secondary | ICD-10-CM | POA: Diagnosis not present

## 2016-04-18 DIAGNOSIS — R1011 Right upper quadrant pain: Secondary | ICD-10-CM

## 2016-04-18 DIAGNOSIS — R109 Unspecified abdominal pain: Secondary | ICD-10-CM | POA: Diagnosis not present

## 2016-04-18 MED ORDER — SODIUM CHLORIDE 0.9 % IV SOLN: 500.0000 mL | INTRAVENOUS | Status: DC

## 2016-04-18 NOTE — Progress Notes (Signed)
Called to room to assist during endoscopic procedure.  Patient ID and intended procedure confirmed with present staff. Received instructions for my participation in the procedure from the performing physician.  

## 2016-04-18 NOTE — Op Note (Signed)
[facility] Patient Name: Nadeen Shipman Procedure Date: 04/18/2016 8:46 AM MRN: [id number] Endoscopist: Jerene Bears , MD Age: 46 Referring MD:  Date of Birth: May 24, 1970 Gender: Female Account #: [id number] Procedure:                Upper GI endoscopy Indications:              Abdominal pain in the right upper quadrant, Nausea                            with vomiting Medicines:                Monitored Anesthesia Care Procedure:                Pre-Anesthesia Assessment:                           - Prior to the procedure, a History and Physical                            was performed, and patient medications and                            allergies were reviewed. The patient's tolerance of                            previous anesthesia was also reviewed. The risks                            and benefits of the procedure and the sedation                            options and risks were discussed with the patient.                            All questions were answered, and informed consent                            was obtained. Prior Anticoagulants: The patient has                            taken no previous anticoagulant or antiplatelet                            agents. ASA Grade Assessment: III - A patient with                            severe systemic disease. After reviewing the risks                            and benefits, the patient was deemed in                            satisfactory condition to undergo the procedure.  After obtaining informed consent, the endoscope was                            passed under direct vision. Throughout the                            procedure, the patient's blood pressure, pulse, and                            oxygen saturations were monitored continuously. The                            Endoscope was introduced through the mouth, and                            advanced to the second part of  duodenum. The upper                            GI endoscopy was accomplished without difficulty.                            The patient tolerated the procedure well. Scope In: Scope Out: Findings:                 The examined esophagus was normal.                           A small hiatal hernia was present.                           A single 10 mm submucosal papule (nodule) with no                            bleeding and no stigmata of recent bleeding was                            found on the lesser curvature of the gastric body.                            Multiple biopsies in bite-on-bite fashion were                            obtained with cold forceps for histology in a                            targeted manner.                           Otherwise normal mucosa was found in the entire                            examined stomach. Biopsies were taken with a cold  forceps for histology and Helicobacter pylori                            testing.                           The examined duodenum was normal. Complications:            No immediate complications. Estimated Blood Loss:     Estimated blood loss was minimal. Impression:               - Normal esophagus.                           - Small hiatal hernia.                           - A single submucosal papule (nodule) found in the                            stomach.                           - Otherwise normal mucosa was found in the entire                            stomach. Biopsied.                           - Normal examined duodenum.                           - Multiple biopsies were obtained. Recommendation:           - Patient has a contact number available for                            emergencies. The signs and symptoms of potential                            delayed complications were discussed with the                            patient. Return to normal activities tomorrow.                             Written discharge instructions were provided to the                            patient.                           - Resume previous diet.                           - Continue present medications.                           - Await pathology results.  If unrevealing, then I                            recommend surgical consultation for consideration                            of cholecystectomy. Jerene Bears, MD 04/18/2016 9:04:55 AM This report has been signed electronically.

## 2016-04-18 NOTE — Progress Notes (Signed)
A and O x3. Report to RN. Tolerated MAC anesthesia well.Teeth unchanged after procedure.

## 2016-04-19 ENCOUNTER — Telehealth: Payer: Self-pay

## 2016-04-19 NOTE — Telephone Encounter (Signed)
Notes faxed.

## 2016-04-19 NOTE — Telephone Encounter (Signed)
  Follow up Call-  Call back number 04/18/2016 02/21/2016  Post procedure Call Back phone  # [phone]  Permission to leave phone message Yes Yes  Some recent data might be hidden     Patient questions:  Do you have a fever, pain , or abdominal swelling? No. Pain Score  0 *  Have you tolerated food without any problems? Yes.    Have you been able to return to your normal activities? Yes.    Do you have any questions about your discharge instructions: Diet   No. Medications  No. Follow up visit  No.  Do you have questions or concerns about your Care? No.  Actions: * If pain score is 4 or above: No action needed, pain <4.

## 2016-04-22 ENCOUNTER — Telehealth: Payer: Self-pay

## 2016-04-22 NOTE — Telephone Encounter (Signed)
Advised patient that per [facility], they have received our office note and are working on prior authorization for xifaxan. Should be in touch with patient within the next day or so. Patient verbalizes understanding.

## 2016-04-25 ENCOUNTER — Telehealth: Payer: Self-pay | Admitting: Internal Medicine

## 2016-04-25 ENCOUNTER — Telehealth: Payer: Self-pay

## 2016-04-25 MED ORDER — RIFAXIMIN 550 MG PO TABS: 550.0000 mg | ORAL_TABLET | Freq: Three times a day (TID) | ORAL | 0 refills | Status: DC

## 2016-04-25 NOTE — Telephone Encounter (Signed)
We have gotten correspondence from [facility] that patient's Xifaxan 550 mg has been approved until 07/24/16.

## 2016-04-25 NOTE — Telephone Encounter (Signed)
Erica Mcdaniel was able to run medication through for $8.35. Medication will be available tomorrow. Patient advised and verbalizes understanding.

## 2016-04-25 NOTE — Telephone Encounter (Signed)
Pt scheduled with Dr. Kieth Brightly at [place] 05/10/16@8 :30am.

## 2016-05-30 DIAGNOSIS — E282 Polycystic ovarian syndrome: Secondary | ICD-10-CM | POA: Diagnosis not present

## 2016-05-30 DIAGNOSIS — K8020 Calculus of gallbladder without cholecystitis without obstruction: Secondary | ICD-10-CM | POA: Diagnosis not present

## 2016-06-03 DIAGNOSIS — R69 Illness, unspecified: Secondary | ICD-10-CM | POA: Diagnosis not present

## 2016-06-04 ENCOUNTER — Other Ambulatory Visit: Payer: Self-pay | Admitting: General Surgery

## 2016-06-04 DIAGNOSIS — E282 Polycystic ovarian syndrome: Secondary | ICD-10-CM

## 2016-06-07 ENCOUNTER — Ambulatory Visit
Admission: RE | Admit: 2016-06-07 | Discharge: 2016-06-07 | Disposition: A | Discharge status: Home / Self Care | Payer: Medicare HMO | Source: Ambulatory Visit | Attending: General Surgery | Admitting: General Surgery

## 2016-06-07 DIAGNOSIS — E282 Polycystic ovarian syndrome: Secondary | ICD-10-CM

## 2016-06-07 DIAGNOSIS — N83201 Unspecified ovarian cyst, right side: Secondary | ICD-10-CM | POA: Diagnosis not present

## 2016-06-07 MED ORDER — IOPAMIDOL (ISOVUE-300) INJECTION 61%
125.0000 mL | Freq: Once | INTRAVENOUS | Status: AC | PRN
  Administered 2016-06-07: 125 mL via INTRAVENOUS

## 2016-06-12 DIAGNOSIS — K8020 Calculus of gallbladder without cholecystitis without obstruction: Secondary | ICD-10-CM | POA: Diagnosis not present

## 2016-06-13 ENCOUNTER — Ambulatory Visit: Payer: Self-pay | Admitting: General Surgery

## 2016-06-19 DIAGNOSIS — I10 Essential (primary) hypertension: Secondary | ICD-10-CM | POA: Diagnosis not present

## 2016-06-19 DIAGNOSIS — Z79899 Other long term (current) drug therapy: Secondary | ICD-10-CM | POA: Diagnosis not present

## 2016-06-19 DIAGNOSIS — M545 Low back pain: Secondary | ICD-10-CM | POA: Diagnosis not present

## 2016-06-19 DIAGNOSIS — E785 Hyperlipidemia, unspecified: Secondary | ICD-10-CM | POA: Diagnosis not present

## 2016-06-19 DIAGNOSIS — R1011 Right upper quadrant pain: Secondary | ICD-10-CM | POA: Diagnosis not present

## 2016-06-19 DIAGNOSIS — M797 Fibromyalgia: Secondary | ICD-10-CM | POA: Diagnosis not present

## 2016-06-19 DIAGNOSIS — F418 Other specified anxiety disorders: Secondary | ICD-10-CM | POA: Diagnosis not present

## 2016-06-19 DIAGNOSIS — K219 Gastro-esophageal reflux disease without esophagitis: Secondary | ICD-10-CM | POA: Diagnosis not present

## 2016-06-19 DIAGNOSIS — Z1389 Encounter for screening for other disorder: Secondary | ICD-10-CM | POA: Diagnosis not present

## 2016-06-19 DIAGNOSIS — E559 Vitamin D deficiency, unspecified: Secondary | ICD-10-CM | POA: Diagnosis not present

## 2016-07-02 DIAGNOSIS — M545 Low back pain: Secondary | ICD-10-CM | POA: Diagnosis not present

## 2016-07-02 DIAGNOSIS — F418 Other specified anxiety disorders: Secondary | ICD-10-CM | POA: Diagnosis not present

## 2016-07-02 DIAGNOSIS — E785 Hyperlipidemia, unspecified: Secondary | ICD-10-CM | POA: Diagnosis not present

## 2016-07-02 DIAGNOSIS — K589 Irritable bowel syndrome without diarrhea: Secondary | ICD-10-CM | POA: Diagnosis not present

## 2016-07-02 DIAGNOSIS — M797 Fibromyalgia: Secondary | ICD-10-CM | POA: Diagnosis not present

## 2016-07-02 DIAGNOSIS — Z6841 Body Mass Index (BMI) 40.0 and over, adult: Secondary | ICD-10-CM | POA: Diagnosis not present

## 2016-07-05 ENCOUNTER — Encounter (HOSPITAL_BASED_OUTPATIENT_CLINIC_OR_DEPARTMENT_OTHER): Payer: Self-pay | Admitting: *Deleted

## 2016-07-05 DIAGNOSIS — L03115 Cellulitis of right lower limb: Secondary | ICD-10-CM | POA: Diagnosis not present

## 2016-07-05 DIAGNOSIS — Z6839 Body mass index (BMI) 39.0-39.9, adult: Secondary | ICD-10-CM | POA: Diagnosis not present

## 2016-07-11 ENCOUNTER — Ambulatory Visit (HOSPITAL_BASED_OUTPATIENT_CLINIC_OR_DEPARTMENT_OTHER): Admission: RE | Admit: 2016-07-11 | Payer: Medicare HMO | Source: Ambulatory Visit | Admitting: General Surgery

## 2016-07-11 ENCOUNTER — Encounter (HOSPITAL_BASED_OUTPATIENT_CLINIC_OR_DEPARTMENT_OTHER): Admission: RE | Payer: Self-pay | Source: Ambulatory Visit

## 2016-07-11 SURGERY — LAPAROSCOPIC CHOLECYSTECTOMY
Anesthesia: General

## 2017-02-11 DIAGNOSIS — M059 Rheumatoid arthritis with rheumatoid factor, unspecified: Secondary | ICD-10-CM | POA: Diagnosis not present

## 2017-02-11 DIAGNOSIS — Z716 Tobacco abuse counseling: Secondary | ICD-10-CM | POA: Diagnosis not present

## 2017-02-11 DIAGNOSIS — F17200 Nicotine dependence, unspecified, uncomplicated: Secondary | ICD-10-CM | POA: Diagnosis not present

## 2017-02-11 DIAGNOSIS — I10 Essential (primary) hypertension: Secondary | ICD-10-CM | POA: Diagnosis not present

## 2017-05-26 DIAGNOSIS — E7800 Pure hypercholesterolemia, unspecified: Secondary | ICD-10-CM | POA: Diagnosis not present

## 2017-05-26 DIAGNOSIS — E039 Hypothyroidism, unspecified: Secondary | ICD-10-CM | POA: Diagnosis not present

## 2017-05-26 DIAGNOSIS — R52 Pain, unspecified: Secondary | ICD-10-CM | POA: Diagnosis not present

## 2017-05-26 DIAGNOSIS — R5383 Other fatigue: Secondary | ICD-10-CM | POA: Diagnosis not present

## 2017-05-26 DIAGNOSIS — Z139 Encounter for screening, unspecified: Secondary | ICD-10-CM | POA: Diagnosis not present

## 2017-05-26 DIAGNOSIS — J3489 Other specified disorders of nose and nasal sinuses: Secondary | ICD-10-CM | POA: Diagnosis not present

## 2017-05-26 DIAGNOSIS — J020 Streptococcal pharyngitis: Secondary | ICD-10-CM | POA: Diagnosis not present

## 2017-05-26 DIAGNOSIS — E119 Type 2 diabetes mellitus without complications: Secondary | ICD-10-CM | POA: Diagnosis not present

## 2017-07-10 DIAGNOSIS — F419 Anxiety disorder, unspecified: Secondary | ICD-10-CM | POA: Diagnosis not present

## 2017-07-10 DIAGNOSIS — I10 Essential (primary) hypertension: Secondary | ICD-10-CM | POA: Diagnosis not present

## 2017-07-10 DIAGNOSIS — H6501 Acute serous otitis media, right ear: Secondary | ICD-10-CM | POA: Diagnosis not present

## 2017-09-24 DIAGNOSIS — E7800 Pure hypercholesterolemia, unspecified: Secondary | ICD-10-CM | POA: Diagnosis not present

## 2017-09-24 DIAGNOSIS — K219 Gastro-esophageal reflux disease without esophagitis: Secondary | ICD-10-CM | POA: Diagnosis not present

## 2017-09-24 DIAGNOSIS — E119 Type 2 diabetes mellitus without complications: Secondary | ICD-10-CM | POA: Diagnosis not present

## 2017-09-24 DIAGNOSIS — E039 Hypothyroidism, unspecified: Secondary | ICD-10-CM | POA: Diagnosis not present

## 2017-10-15 DIAGNOSIS — Z124 Encounter for screening for malignant neoplasm of cervix: Secondary | ICD-10-CM | POA: Diagnosis not present

## 2017-10-15 DIAGNOSIS — N83202 Unspecified ovarian cyst, left side: Secondary | ICD-10-CM | POA: Diagnosis not present

## 2017-10-15 DIAGNOSIS — N951 Menopausal and female climacteric states: Secondary | ICD-10-CM | POA: Diagnosis not present

## 2017-10-15 DIAGNOSIS — N83201 Unspecified ovarian cyst, right side: Secondary | ICD-10-CM | POA: Diagnosis not present

## 2017-10-15 DIAGNOSIS — Z01419 Encounter for gynecological examination (general) (routine) without abnormal findings: Secondary | ICD-10-CM | POA: Diagnosis not present

## 2017-11-03 ENCOUNTER — Encounter (INDEPENDENT_AMBULATORY_CARE_PROVIDER_SITE_OTHER): Payer: Self-pay | Admitting: Orthopaedic Surgery

## 2017-11-03 ENCOUNTER — Other Ambulatory Visit (INDEPENDENT_AMBULATORY_CARE_PROVIDER_SITE_OTHER): Payer: Self-pay | Admitting: Radiology

## 2017-11-03 ENCOUNTER — Ambulatory Visit (INDEPENDENT_AMBULATORY_CARE_PROVIDER_SITE_OTHER): Payer: Medicare HMO

## 2017-11-03 ENCOUNTER — Ambulatory Visit (INDEPENDENT_AMBULATORY_CARE_PROVIDER_SITE_OTHER): Payer: Medicare HMO | Admitting: Orthopaedic Surgery

## 2017-11-03 VITALS — BP 142/88 | HR 79 | Ht 68.5 in | Wt 253.0 lb

## 2017-11-03 DIAGNOSIS — M25551 Pain in right hip: Secondary | ICD-10-CM

## 2017-11-03 DIAGNOSIS — G8929 Other chronic pain: Secondary | ICD-10-CM

## 2017-11-03 DIAGNOSIS — M545 Low back pain, unspecified: Secondary | ICD-10-CM

## 2017-11-03 NOTE — Progress Notes (Signed)
Office Visit Note   Patient: Erica Mcdaniel           Date of Birth: 11/18/1970           MRN: [id number] Visit Date: 11/03/2017              Requested by: Cyndi Bender, PA-C [address] [place], [place] [address] PCP: Suzan Garibaldi, FNP   Assessment & Plan: Visit Diagnoses:  1. Pain of right hip joint   2. Chronic midline low back pain, unspecified whether sciatica present     Plan: Chronic low back pain associated with right buttock discomfort.  I believe this refers from her lumbar spine and probably L4-5 level.  I would like to obtain an MRI scan with the Mars technique and see her back shortly thereafter.  Follow-Up Instructions: Return after MRI L-S spine.   Orders:  Orders Placed This Encounter  Procedures  . XR HIP UNILAT W OR W/O PELVIS 2-3 VIEWS RIGHT  . XR Lumbar Spine 2-3 Views   No orders of the defined types were placed in this encounter.     Procedures: No procedures performed   Clinical Data: No additional findings.   Subjective: Chief Complaint  Patient presents with  . New Patient (Initial Visit)    R HIP AND L FOOT PAIN FELL GETTING OUT OF BATH 1ST TIME SUMMER 2018, NO FOLLOW UP PT HAS FALLEN 2-3 TIMES AND LANGS ON BACK   Erica Mcdaniel relates a long history of low back pain associated with some right hip pain right buttock pain over a period of many months.  She notes that she has had several falls since last winter.  On each occasion she seems to fall on her right buttock.  She thinks that the problem might be a "deep bruise.  Occasionally she will have some pain referred along the anterior aspect of her hip.  She has chronic back pain and has had a prior L5-S1 fusion by Dr. Ellene Route in 2010.  She also notes in about 2015 or 2016 she had an epidural steroid injection.  She is not experiencing pain referred further distally in her right leg.  No symptoms referable to the left lower extremity.  No bowel or bladder dysfunction  HPI  Review of  Systems  Constitutional: Positive for fatigue. Negative for fever.  HENT: Negative for ear pain.   Eyes: Negative for pain.  Respiratory: Negative for cough and shortness of breath.   Cardiovascular: Positive for leg swelling.  Gastrointestinal: Negative for constipation and diarrhea.  Genitourinary: Negative for difficulty urinating.  Musculoskeletal: Positive for back pain. Negative for neck pain.  Skin: Negative for rash.  Allergic/Immunologic: Negative for food allergies.  Neurological: Positive for weakness and numbness.  Hematological: Bruises/bleeds easily.  Psychiatric/Behavioral: Positive for sleep disturbance.     Objective: Vital Signs: BP (!) 142/88 (BP Location: Right Arm, Patient Position: Sitting, Cuff Size: Normal)   Pulse 79   Ht 5' 8.5" (1.74 m)   Wt 253 lb (114.8 kg)   BMI 37.91 kg/m   Physical Exam  Constitutional: She is oriented to person, place, and time. She appears well-developed and well-nourished.  HENT:  Mouth/Throat: Oropharynx is clear and moist.  Eyes: Pupils are equal, round, and reactive to light. EOM are normal.  Pulmonary/Chest: Effort normal.  Neurological: She is alert and oriented to person, place, and time.  Skin: Skin is warm and dry.  Psychiatric: She has a normal mood and affect. Her behavior  is normal.    Ortho Exam awake alert and oriented x3.  Comfortable sitting.  Straight leg raise negative.  Absolutely no pain with internal or external rotation of either hip.  Does have some mild discomfort in the superior right buttock compared to the left.  Some percussible tenderness of the lumbar spine.  Neurologically intact.  Reflexes symmetrical  Specialty Comments:  No specialty comments available.  Imaging: Xr Hip Unilat W Or W/o Pelvis 2-3 Views Right  Result Date: 11/03/2017 AP the pelvis and lateral of the right hip are obtained.  There is a very small inferior femoral head spur on the lateral film.  The joint space is  well-maintained and no sclerosis.  No acute changes.  Xr Lumbar Spine 2-3 Views  Result Date: 11/03/2017 Films of the lumbar spine demonstrate a prior fusion of L5-S1 with facet screws.  The 2 inferior screws appear to have fractured no old films for comparison.  There is considerable degenerative disc disease at L4-5 with a slight anterior listhesis of L4 on 5. some vague calcification of the abdominal aorta    PMFS History: Patient Active Problem List   Diagnosis Date Noted  . Lateral epicondylitis of right elbow 12/08/2012  . Ventral hernia 11/02/2010  . Abnormal intestinal absorption  11/02/2010  . Abdominal pain 11/02/2010  . Fatigue 11/02/2010  . Personal history of colonic polyps 11/02/2010  . GERD (gastroesophageal reflux disease) 11/02/2010  . GERD 08/06/2007  . IBS 08/06/2007  . ABDOMINAL PAIN-RUQ 08/06/2007  . OBESITY 08/05/2007  . ANXIETY 08/05/2007  . SOMATIZATION DISORDER 08/05/2007  . OVARIAN CYST 08/05/2007  . PMS 08/05/2007  . DYSFUNCTIONAL UTERINE BLEEDING 08/05/2007  . FEMALE INFERTILITY 08/05/2007  . DEGENERATIVE JOINT DISEASE 08/05/2007  . OSTEOARTHRITIS, KNEES, BILATERAL 08/05/2007  . KNEE PAIN, BILATERAL 08/05/2007  . DEGENERATIVE DISC DISEASE 08/05/2007  . LOW BACK PAIN, CHRONIC 08/05/2007  . FIBROMYALGIA 08/05/2007  . INSOMNIA 08/05/2007  . PAP SMEAR, ABNORMAL 08/05/2007   Past Medical History:  Diagnosis Date  . Anxiety   . Arthritis   . Cancer (HCC)    skin squamous cell arm  . Depression   . Fibromyalgia   . GERD (gastroesophageal reflux disease)   . Hypertension   . IBS (irritable bowel syndrome)   . Pneumonia    hx  . Polycystic ovarian disease   . Tendonitis   . Tubular adenoma of colon 2018   multiple    Family History  Problem Relation Age of Onset  . Heart disease Brother   . Arthritis Brother   . Lung cancer Mother        meso  . Arthritis Mother   . Hyperlipidemia Brother   . Arthritis Sister   . Colon polyps Brother    . Thyroid disease Father   . Arthritis Father   . Diabetes Maternal Grandmother   . Colon cancer Neg Hx   . Uterine cancer Neg Hx   . Stomach cancer Neg Hx   . Rectal cancer Neg Hx     Past Surgical History:  Procedure Laterality Date  . APPENDECTOMY    . BACK SURGERY    . BACK SURGERY  2010   rods put in  . KNEE ARTHROSCOPY Left 13  . NEUROPLASTY / TRANSPOSITION MEDIAN NERVE AT CARPAL TUNNEL BILATERAL    . OVARIAN CYST SURGERY  1983  . ROTATOR CUFF REPAIR  2003,04   right, and elbow,left  . TENDON RECONSTRUCTION Right 12/08/2012   Procedure: ELBOW TENDON RECONSTRUCTION/Right  Lateral Epicondylar Debridement with Repair.;  Surgeon: Garald Balding, MD;  Location: [facility];  Service: Orthopedics;  Laterality: Right;  Right Lateral Epicondylar Debridement with Repair.  Marland Kitchen TRIGGER FINGER RELEASE Right 08   Social History   Occupational History  . Occupation: disability  Tobacco Use  . Smoking status: Current Every Day Smoker    Packs/day: 0.75    Years: 20.00    Pack years: 15.00    Types: Cigarettes  . Smokeless tobacco: Never Used  . Tobacco comment: taking wellbutrin to help quitting  use vqpor cig now  Substance and Sexual Activity  . Alcohol use: No  . Drug use: No  . Sexual activity: Not on file

## 2017-11-06 ENCOUNTER — Telehealth (INDEPENDENT_AMBULATORY_CARE_PROVIDER_SITE_OTHER): Payer: Self-pay | Admitting: Orthopaedic Surgery

## 2017-11-06 NOTE — Telephone Encounter (Signed)
Patient has MRI of Lspine scheduled for 10/13. Patient did schedule a fu appt on 10/15 for mri review, but did ask if at all possible could Dr. Durward Fortes call with the results? Patient is on disability, and has a copayment, plus expenses for travel to get here. Please advise.

## 2017-11-06 NOTE — Telephone Encounter (Signed)
Please advise 

## 2017-11-09 ENCOUNTER — Ambulatory Visit
Admission: RE | Admit: 2017-11-09 | Discharge: 2017-11-09 | Disposition: A | Discharge status: Home / Self Care | Payer: Medicare HMO | Source: Ambulatory Visit | Attending: Orthopaedic Surgery | Admitting: Orthopaedic Surgery

## 2017-11-09 DIAGNOSIS — G8929 Other chronic pain: Secondary | ICD-10-CM

## 2017-11-09 DIAGNOSIS — M48061 Spinal stenosis, lumbar region without neurogenic claudication: Secondary | ICD-10-CM | POA: Diagnosis not present

## 2017-11-09 DIAGNOSIS — M545 Low back pain: Principal | ICD-10-CM

## 2017-11-10 ENCOUNTER — Other Ambulatory Visit (INDEPENDENT_AMBULATORY_CARE_PROVIDER_SITE_OTHER): Payer: Self-pay | Admitting: *Deleted

## 2017-11-10 ENCOUNTER — Telehealth (INDEPENDENT_AMBULATORY_CARE_PROVIDER_SITE_OTHER): Payer: Self-pay | Admitting: Orthopaedic Surgery

## 2017-11-10 MED ORDER — TRAMADOL HCL 50 MG PO TABS: 50.0000 mg | ORAL_TABLET | Freq: Three times a day (TID) | ORAL | 0 refills | Status: DC | PRN

## 2017-11-10 NOTE — Telephone Encounter (Signed)
Patient called stating she is scheduled to see Dr. Ellene Route on 12/04/17 and is requesting a prescription of pain medicine until her appointment.  Patient states she has pain expecially with standing and doing the dishes.  Patient's pharmacy is [facility] in [place].

## 2017-11-10 NOTE — Telephone Encounter (Signed)
called

## 2017-11-10 NOTE — Telephone Encounter (Signed)
Please advise 

## 2017-11-10 NOTE — Telephone Encounter (Signed)
Tramadol 50 mg #30 1-2 tabs po tid prn

## 2017-11-10 NOTE — Telephone Encounter (Signed)
RX called into [facility], [place], I called patient.

## 2017-11-10 NOTE — Telephone Encounter (Signed)
Will call-please remind me when scan available

## 2017-11-10 NOTE — Telephone Encounter (Signed)
Scan on your desk. Please call patient.

## 2017-11-11 ENCOUNTER — Ambulatory Visit (INDEPENDENT_AMBULATORY_CARE_PROVIDER_SITE_OTHER): Payer: Medicare HMO | Admitting: Orthopaedic Surgery

## 2017-11-14 ENCOUNTER — Telehealth (INDEPENDENT_AMBULATORY_CARE_PROVIDER_SITE_OTHER): Payer: Self-pay | Admitting: Orthopaedic Surgery

## 2017-11-14 NOTE — Telephone Encounter (Signed)
Patient needs a copy of her [place] taken here to take to her appt with the spine surgeon. Patients appt is this Thursday. 11/20/17. Please call when ready to pick up.

## 2017-11-14 NOTE — Telephone Encounter (Signed)
I called patient, CD at front desk. 

## 2017-11-20 DIAGNOSIS — M47816 Spondylosis without myelopathy or radiculopathy, lumbar region: Secondary | ICD-10-CM | POA: Diagnosis not present

## 2018-04-03 DIAGNOSIS — M797 Fibromyalgia: Secondary | ICD-10-CM | POA: Diagnosis not present

## 2018-04-03 DIAGNOSIS — I10 Essential (primary) hypertension: Secondary | ICD-10-CM | POA: Diagnosis not present

## 2018-04-03 DIAGNOSIS — E039 Hypothyroidism, unspecified: Secondary | ICD-10-CM | POA: Diagnosis not present

## 2018-04-03 DIAGNOSIS — E038 Other specified hypothyroidism: Secondary | ICD-10-CM | POA: Diagnosis not present

## 2018-04-03 DIAGNOSIS — E119 Type 2 diabetes mellitus without complications: Secondary | ICD-10-CM | POA: Diagnosis not present

## 2018-04-03 DIAGNOSIS — M1990 Unspecified osteoarthritis, unspecified site: Secondary | ICD-10-CM | POA: Diagnosis not present

## 2018-04-03 DIAGNOSIS — K219 Gastro-esophageal reflux disease without esophagitis: Secondary | ICD-10-CM | POA: Diagnosis not present

## 2018-04-03 DIAGNOSIS — E0789 Other specified disorders of thyroid: Secondary | ICD-10-CM | POA: Diagnosis not present

## 2018-04-03 DIAGNOSIS — E7800 Pure hypercholesterolemia, unspecified: Secondary | ICD-10-CM | POA: Diagnosis not present

## 2018-04-14 DIAGNOSIS — J019 Acute sinusitis, unspecified: Secondary | ICD-10-CM | POA: Diagnosis not present

## 2018-04-14 DIAGNOSIS — J020 Streptococcal pharyngitis: Secondary | ICD-10-CM | POA: Diagnosis not present

## 2018-04-14 DIAGNOSIS — H6501 Acute serous otitis media, right ear: Secondary | ICD-10-CM | POA: Diagnosis not present

## 2018-07-02 DIAGNOSIS — M1990 Unspecified osteoarthritis, unspecified site: Secondary | ICD-10-CM | POA: Diagnosis not present

## 2018-07-02 DIAGNOSIS — E7800 Pure hypercholesterolemia, unspecified: Secondary | ICD-10-CM | POA: Diagnosis not present

## 2018-07-02 DIAGNOSIS — F331 Major depressive disorder, recurrent, moderate: Secondary | ICD-10-CM | POA: Diagnosis not present

## 2018-07-02 DIAGNOSIS — I10 Essential (primary) hypertension: Secondary | ICD-10-CM | POA: Diagnosis not present

## 2018-07-28 ENCOUNTER — Encounter: Payer: Self-pay | Admitting: Orthopaedic Surgery

## 2018-07-28 ENCOUNTER — Ambulatory Visit: Payer: Medicare HMO | Admitting: Orthopaedic Surgery

## 2018-07-28 ENCOUNTER — Other Ambulatory Visit: Payer: Self-pay

## 2018-07-28 ENCOUNTER — Ambulatory Visit (INDEPENDENT_AMBULATORY_CARE_PROVIDER_SITE_OTHER): Payer: Medicare HMO

## 2018-07-28 VITALS — BP 133/97 | HR 87 | Ht 68.0 in | Wt 247.0 lb

## 2018-07-28 DIAGNOSIS — M25562 Pain in left knee: Secondary | ICD-10-CM | POA: Insufficient documentation

## 2018-07-28 DIAGNOSIS — G8929 Other chronic pain: Secondary | ICD-10-CM

## 2018-07-28 DIAGNOSIS — M1712 Unilateral primary osteoarthritis, left knee: Secondary | ICD-10-CM | POA: Diagnosis not present

## 2018-07-28 MED ORDER — BUPIVACAINE HCL 0.25 % IJ SOLN
2.0000 mL | INTRAMUSCULAR | Status: AC | PRN
  Administered 2018-07-28: 2 mL via INTRA_ARTICULAR

## 2018-07-28 MED ORDER — METHYLPREDNISOLONE ACETATE 40 MG/ML IJ SUSP
80.0000 mg | INTRAMUSCULAR | Status: AC | PRN
  Administered 2018-07-28: 80 mg via INTRA_ARTICULAR

## 2018-07-28 MED ORDER — LIDOCAINE HCL 1 % IJ SOLN
2.0000 mL | INTRAMUSCULAR | Status: AC | PRN
  Administered 2018-07-28: 2 mL

## 2018-07-28 NOTE — Progress Notes (Signed)
Office Visit Note   Patient: Erica Mcdaniel           Date of Birth: 1970-08-26           MRN: [id number] Visit Date: 07/28/2018              Requested by: Suzan Garibaldi, [address] [place],  [place] [address] PCP: Suzan Garibaldi, FNP   Assessment & Plan: Visit Diagnoses:  1. Acute pain of left knee   2. Chronic pain of left knee   3. Unilateral primary osteoarthritis, left knee     Plan:  #1: At this time are going to give a corticosteroid injection of the left knee and that was accomplished atraumatically.  Tolerated procedure well. #2: I have informed her about the use of Voltaren gel which she can get over-the-counter now for her knee as well as for her lumbar spine. #3: If this does not improve her symptoms we could perform an MRI scan.  Follow-Up Instructions: Return if symptoms worsen or fail to improve.   Orders:  Orders Placed This Encounter  Procedures  . XR KNEE 3 VIEW LEFT   No orders of the defined types were placed in this encounter.     Procedures: Large Joint Inj: L knee on 07/28/2018 10:41 AM Indications: pain and diagnostic evaluation Details: 25 G 1.5 in needle, anteromedial approach  Arthrogram: No  Medications: 2 mL lidocaine 1 %; 80 mg methylPREDNISolone acetate 40 MG/ML; 2 mL bupivacaine 0.25 % Outcome: tolerated well, no immediate complications Procedure, treatment alternatives, risks and benefits explained, specific risks discussed. Consent was given by the patient. Immediately prior to procedure a time out was called to verify the correct patient, procedure, equipment, support staff and site/side marked as required. Patient was prepped and draped in the usual sterile fashion.       Clinical Data: No additional findings.   Subjective: Chief Complaint  Patient presents with  . Left Knee - Pain   HPI: Patient presents today for left knee pain. She said that it has been hurting for 2-3 weeks. No known injury. Her pain is  located on the medial and posterior side. The pain is constant, but gets worse with prolonged standing or walking. She states that her knee feels unstable, swollen, and pops. She has taken tylenol and tramadol, but it does not help. Patient states that she had arthroscopic surgery with Dr.Whitfield in 2011.  She has been on long-term Celebrex.   Review of Systems  Constitutional: Negative for fatigue.  HENT: Positive for ear pain.   Eyes: Negative for pain.  Respiratory: Negative for shortness of breath.   Cardiovascular: Negative for leg swelling.  Gastrointestinal: Negative for constipation and diarrhea.  Endocrine: Negative for cold intolerance and heat intolerance.  Genitourinary: Negative for difficulty urinating.  Musculoskeletal: Positive for joint swelling.  Skin: Negative for rash.  Allergic/Immunologic: Negative for food allergies.  Neurological: Negative for weakness.  Hematological: Bruises/bleeds easily.  Psychiatric/Behavioral: Positive for sleep disturbance.     Objective: Vital Signs: BP (!) 133/97   Pulse 87   Ht 5\' 8"  (1.727 m)   Wt 247 lb (112 kg)   BMI 37.56 kg/m   Physical Exam Constitutional:      Appearance: She is well-developed.  Eyes:     Pupils: Pupils are equal, round, and reactive to light.  Pulmonary:     Effort: Pulmonary effort is normal.  Skin:    General: Skin is warm and dry.  Neurological:     Mental Status: She is alert and oriented to person, place, and time.  Psychiatric:        Behavior: Behavior normal.     Ortho Exam  Exam today reveals minimal effusion.  She does have mild patellofemoral crepitance with range of motion.  Some pain with varus and valgus stressing.  She does have a mildly positive McMurray's.  Pointing to the medial compartment.  Also has tenderness to palpation along the medial joint line.  Not much soreness on the lateral joint line.  Good hip motion.   Specialty Comments:  No specialty comments available.   Imaging: Xr Knee 3 View Left  Result Date: 07/28/2018 Three-view x-ray of the left knee reveals lateral positioning of the patella on the AP.  Does appear to have some narrowing of the medial compartment.  Still maintaining a very good joint space.  Some peaking of the intercondylar spines.  Does have some degenerative changes noted in the patellofemoral joint.  Still maintaining good joint space there also.    PMFS History: Current Outpatient Medications  Medication Sig Dispense Refill  . celecoxib (CELEBREX) 100 MG capsule Take 100 mg by mouth 2 (two) times daily.      . cetirizine (ZYRTEC) 10 MG tablet Take 10 mg by mouth daily.    Marland Kitchen dicyclomine (BENTYL) 10 MG capsule Take 10 mg by mouth 4 (four) times daily -  before meals and at bedtime.    . DULoxetine (CYMBALTA) 60 MG capsule Take 60 mg by mouth 2 (two) times daily.      Marland Kitchen esomeprazole (NEXIUM) 40 MG capsule Take 1 capsule (40 mg total) by mouth 2 (two) times daily before a meal. 60 capsule 3  . hydrochlorothiazide (HYDRODIURIL) 25 MG tablet Take 25 mg by mouth daily.    . Multiple Vitamins-Minerals (MULTIVITAMIN WITH MINERALS) tablet Take 1 tablet by mouth daily.    . promethazine (PHENERGAN) 25 MG tablet Take 25 mg by mouth every 6 (six) hours as needed for nausea or vomiting.    . rifaximin (XIFAXAN) 550 MG TABS tablet Take 1 tablet (550 mg total) by mouth 3 (three) times daily. 42 tablet 0  . tiZANidine (ZANAFLEX) 4 MG capsule Take 4 mg by mouth at bedtime.    . traMADol (ULTRAM) 50 MG tablet Take 1 tablet (50 mg total) by mouth 3 (three) times daily as needed (1-2 tabs PO TID PRN). 30 tablet 0  . Vitamin D, Ergocalciferol, (DRISDOL) 50000 UNITS CAPS Take 100,000 Units by mouth every 21 ( twenty-one) days. Once a week      No current facility-administered medications for this visit.     Patient Active Problem List   Diagnosis Date Noted  . Pain in left knee 07/28/2018  . Unilateral primary osteoarthritis, left knee  07/28/2018  . Lateral epicondylitis of right elbow 12/08/2012  . Ventral hernia 11/02/2010  . Abnormal intestinal absorption  11/02/2010  . Abdominal pain 11/02/2010  . Fatigue 11/02/2010  . Personal history of colonic polyps 11/02/2010  . GERD (gastroesophageal reflux disease) 11/02/2010  . GERD 08/06/2007  . IBS 08/06/2007  . ABDOMINAL PAIN-RUQ 08/06/2007  . OBESITY 08/05/2007  . ANXIETY 08/05/2007  . SOMATIZATION DISORDER 08/05/2007  . OVARIAN CYST 08/05/2007  . PMS 08/05/2007  . DYSFUNCTIONAL UTERINE BLEEDING 08/05/2007  . FEMALE INFERTILITY 08/05/2007  . DEGENERATIVE JOINT DISEASE 08/05/2007  . OSTEOARTHRITIS, KNEES, BILATERAL 08/05/2007  . KNEE PAIN, BILATERAL 08/05/2007  . DEGENERATIVE DISC DISEASE 08/05/2007  . LOW  BACK PAIN, CHRONIC 08/05/2007  . FIBROMYALGIA 08/05/2007  . INSOMNIA 08/05/2007  . PAP SMEAR, ABNORMAL 08/05/2007   Past Medical History:  Diagnosis Date  . Anxiety   . Arthritis   . Cancer (HCC)    skin squamous cell arm  . Depression   . Fibromyalgia   . GERD (gastroesophageal reflux disease)   . Hypertension   . IBS (irritable bowel syndrome)   . Pneumonia    hx  . Polycystic ovarian disease   . Tendonitis   . Tubular adenoma of colon 2018   multiple    Family History  Problem Relation Age of Onset  . Heart disease Brother   . Arthritis Brother   . Lung cancer Mother        meso  . Arthritis Mother   . Hyperlipidemia Brother   . Arthritis Sister   . Colon polyps Brother   . Thyroid disease Father   . Arthritis Father   . Diabetes Maternal Grandmother   . Colon cancer Neg Hx   . Uterine cancer Neg Hx   . Stomach cancer Neg Hx   . Rectal cancer Neg Hx     Past Surgical History:  Procedure Laterality Date  . APPENDECTOMY    . BACK SURGERY    . BACK SURGERY  2010   rods put in  . KNEE ARTHROSCOPY Left 13  . NEUROPLASTY / TRANSPOSITION MEDIAN NERVE AT CARPAL TUNNEL BILATERAL    . OVARIAN CYST SURGERY  1983  . ROTATOR CUFF REPAIR   2003,04   right, and elbow,left  . TENDON RECONSTRUCTION Right 12/08/2012   Procedure: ELBOW TENDON RECONSTRUCTION/Right Lateral Epicondylar Debridement with Repair.;  Surgeon: Garald Balding, MD;  Location: [facility];  Service: Orthopedics;  Laterality: Right;  Right Lateral Epicondylar Debridement with Repair.  Marland Kitchen TRIGGER FINGER RELEASE Right 08   Social History   Occupational History  . Occupation: disability  Tobacco Use  . Smoking status: Current Every Day Smoker    Packs/day: 0.75    Years: 20.00    Pack years: 15.00    Types: Cigarettes  . Smokeless tobacco: Never Used  . Tobacco comment: taking wellbutrin to help quitting  use vqpor cig now  Substance and Sexual Activity  . Alcohol use: No  . Drug use: No  . Sexual activity: Not on file

## 2020-08-07 ENCOUNTER — Telehealth: Payer: Self-pay | Admitting: Orthopaedic Surgery

## 2020-08-07 NOTE — Telephone Encounter (Signed)
Do you have these?

## 2020-08-07 NOTE — Telephone Encounter (Signed)
IC, informed patient that we have not received records. She is going to reach out to the office.

## 2020-08-07 NOTE — Telephone Encounter (Signed)
Pt called stating she was supposed to have medical records faxed from [place] in [place] at the beginning of April. Pt would like to make sure we received this.   [facility] [phone] Coming from Dr. Shirlee Latch

## 2020-08-08 ENCOUNTER — Telehealth: Payer: Self-pay | Admitting: Orthopaedic Surgery

## 2020-08-08 NOTE — Telephone Encounter (Signed)
Pt called stating she is having her records refaxed from [place] and would like a CB when our office receives them.   [phone]

## 2020-08-09 NOTE — Telephone Encounter (Signed)
Records received, filed in chart. IC advised pt.

## 2020-08-30 ENCOUNTER — Ambulatory Visit (INDEPENDENT_AMBULATORY_CARE_PROVIDER_SITE_OTHER): Payer: Medicare HMO

## 2020-08-30 ENCOUNTER — Encounter: Payer: Self-pay | Admitting: Orthopaedic Surgery

## 2020-08-30 ENCOUNTER — Ambulatory Visit: Payer: Medicare HMO | Admitting: Orthopaedic Surgery

## 2020-08-30 ENCOUNTER — Other Ambulatory Visit: Payer: Self-pay

## 2020-08-30 VITALS — Ht 68.0 in | Wt 253.0 lb

## 2020-08-30 DIAGNOSIS — Z96652 Presence of left artificial knee joint: Secondary | ICD-10-CM | POA: Diagnosis not present

## 2020-08-30 DIAGNOSIS — M25562 Pain in left knee: Secondary | ICD-10-CM

## 2020-08-30 DIAGNOSIS — M1712 Unilateral primary osteoarthritis, left knee: Secondary | ICD-10-CM | POA: Diagnosis not present

## 2020-08-30 DIAGNOSIS — G8929 Other chronic pain: Secondary | ICD-10-CM

## 2020-08-30 DIAGNOSIS — Z96659 Presence of unspecified artificial knee joint: Secondary | ICD-10-CM | POA: Insufficient documentation
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
## 2020-12-06 ENCOUNTER — Ambulatory Visit: Payer: Medicare HMO | Admitting: Orthopaedic Surgery

## 2020-12-08 ENCOUNTER — Other Ambulatory Visit: Payer: Self-pay | Admitting: Obstetrics and Gynecology

## 2020-12-08 DIAGNOSIS — Z1231 Encounter for screening mammogram for malignant neoplasm of breast: Secondary | ICD-10-CM

## 2020-12-15 ENCOUNTER — Ambulatory Visit
Admission: RE | Admit: 2020-12-15 | Discharge: 2020-12-15 | Disposition: A | Discharge status: Home / Self Care | Payer: Medicare HMO | Source: Ambulatory Visit | Attending: Obstetrics and Gynecology | Admitting: Obstetrics and Gynecology

## 2020-12-15 ENCOUNTER — Other Ambulatory Visit: Payer: Self-pay

## 2020-12-15 DIAGNOSIS — Z1231 Encounter for screening mammogram for malignant neoplasm of breast: Secondary | ICD-10-CM

## 2020-12-19 ENCOUNTER — Ambulatory Visit: Payer: Medicare HMO | Admitting: Orthopaedic Surgery

## 2020-12-20 ENCOUNTER — Ambulatory Visit: Payer: Medicare HMO | Admitting: Orthopaedic Surgery

## 2021-01-04 ENCOUNTER — Encounter: Payer: Self-pay | Admitting: Orthopaedic Surgery

## 2021-01-04 ENCOUNTER — Other Ambulatory Visit: Payer: Self-pay | Admitting: Physician Assistant

## 2021-01-04 ENCOUNTER — Ambulatory Visit: Payer: Medicare HMO | Admitting: Orthopaedic Surgery

## 2021-01-04 ENCOUNTER — Other Ambulatory Visit: Payer: Self-pay

## 2021-01-04 DIAGNOSIS — M542 Cervicalgia: Secondary | ICD-10-CM

## 2021-01-04 DIAGNOSIS — G8929 Other chronic pain: Secondary | ICD-10-CM | POA: Diagnosis not present

## 2021-01-04 MED ORDER — METHOCARBAMOL 500 MG PO TABS: 500.0000 mg | ORAL_TABLET | Freq: Every evening | ORAL | 0 refills | Status: DC | PRN

## 2021-01-04 NOTE — Progress Notes (Signed)
Office Visit Note   Patient: Erica Mcdaniel           Date of Birth: 05/05/1970           MRN: [id number] Visit Date: 01/04/2021              Requested by: Suzan Garibaldi, FNP [address],  [place] [address] PCP: Suzan Garibaldi, FNP   Assessment & Plan: Visit Diagnoses: No diagnosis found.  Plan: Patient is a 50 year old woman with a chief complaint today of neck pain.  She has a history of arthritis in her lower back and has a cage.  She also has had previous left shoulder surgery as well as carpal tunnel releases.  She has had physical therapy for her neck in the past and has found it minimally helpful.  Also left knee replacement.  Her biggest complaint today though is of neck pain.  She said over the summer she was swimming and it felt good.  She thinks as the weather has changed she has had more pain.  She had an MRI done prior to returning to this area in January 2021.   she had findings consistent with degenerative changes at multiple levels.  She has not tried any injections into her neck but has had oral steroids which improved her symptoms for short period of time.  She is also responded well to steroid injections in her shoulders.  We will refer her to Dr. Ernestina Patches for possible epidural steroid injections.  We will scanned her MRI findings to her chart.  She would like to have something to help her sleep at night.  I will prescribe some Robaxin  Follow-Up Instructions: No follow-ups on file.   Orders:  No orders of the defined types were placed in this encounter.  No orders of the defined types were placed in this encounter.     Procedures: No procedures performed   Clinical Data: No additional findings.   Subjective: Chief Complaint  Patient presents with   Neck - Pain   Left Shoulder - Pain  Patient presents today for left shoulder and neck pain. She said that she has pain that radiates into both upper arms. She experiences a constant ache in her  shoulder and neck. She takes over the counter pain medicine. She has a history of left shoulder surgery 18years ago while living in [place]. She states that she can turn or move certain ways and experience intense pain.     Review of Systems  All other systems reviewed and are negative.   Objective: Vital Signs: There were no vitals taken for this visit.  Physical Exam Vitals reviewed.  Pulmonary:     Effort: Pulmonary effort is normal.  Skin:    General: Skin is warm and dry.  Neurological:     General: No focal deficit present.  Psychiatric:        Mood and Affect: Mood normal.        Behavior: Behavior normal.    Ortho Exam Examination of her cervical spine.  She has trigger points throughout the paravertebral muscles.  No step-off no palpable abnormalities.  She has significant pain and decreased range of motion with forward flexion of her neck as well as extension turning left to right is also quite painful no paresthesias distally.  She has had some decreased grip strength but again she says this may be due to her carpal tunnel that has returned Specialty Comments:  No specialty comments  available.  Imaging: No results found.   PMFS History: Patient Active Problem List   Diagnosis Date Noted   S/P TKR (total knee replacement) 08/30/2020   Pain in left knee 07/28/2018   Unilateral primary osteoarthritis, left knee 07/28/2018   Lateral epicondylitis of right elbow 12/08/2012   Ventral hernia 11/02/2010   Abnormal intestinal absorption  11/02/2010   Abdominal pain 11/02/2010   Fatigue 11/02/2010   Personal history of colonic polyps 11/02/2010   GERD (gastroesophageal reflux disease) 11/02/2010   GERD 08/06/2007   IBS 08/06/2007   ABDOMINAL PAIN-RUQ 08/06/2007   OBESITY 08/05/2007   ANXIETY 08/05/2007   SOMATIZATION DISORDER 08/05/2007   OVARIAN CYST 08/05/2007   PMS 08/05/2007   DYSFUNCTIONAL UTERINE BLEEDING 08/05/2007   FEMALE INFERTILITY 08/05/2007    DEGENERATIVE JOINT DISEASE 08/05/2007   OSTEOARTHRITIS, KNEES, BILATERAL 08/05/2007   KNEE PAIN, BILATERAL 08/05/2007   DEGENERATIVE DISC DISEASE 08/05/2007   LOW BACK PAIN, CHRONIC 08/05/2007   FIBROMYALGIA 08/05/2007   INSOMNIA 08/05/2007   PAP SMEAR, ABNORMAL 08/05/2007   Past Medical History:  Diagnosis Date   Anxiety    Arthritis    Cancer ([place])    skin squamous cell arm   Depression    Fibromyalgia    GERD (gastroesophageal reflux disease)    Hypertension    IBS (irritable bowel syndrome)    Pneumonia    hx   Polycystic ovarian disease    Tendonitis    Tubular adenoma of colon 2018   multiple    Family History  Problem Relation Age of Onset   Heart disease Brother    Arthritis Brother    Lung cancer Mother        meso   Arthritis Mother    Hyperlipidemia Brother    Arthritis Sister    Colon polyps Brother    Thyroid disease Father    Arthritis Father    Diabetes Maternal Grandmother    Colon cancer Neg Hx    Uterine cancer Neg Hx    Stomach cancer Neg Hx    Rectal cancer Neg Hx     Past Surgical History:  Procedure Laterality Date   APPENDECTOMY     BACK SURGERY     BACK SURGERY  2010   rods put in   KNEE ARTHROSCOPY Left 13   NEUROPLASTY / TRANSPOSITION MEDIAN NERVE AT CARPAL TUNNEL BILATERAL     OVARIAN CYST SURGERY  1983   ROTATOR CUFF REPAIR  2003,04   right, and elbow,left   TENDON RECONSTRUCTION Right 12/08/2012   Procedure: ELBOW TENDON RECONSTRUCTION/Right Lateral Epicondylar Debridement with Repair.;  Surgeon: Garald Balding, MD;  Location: [facility];  Service: Orthopedics;  Laterality: Right;  Right Lateral Epicondylar Debridement with Repair.   TRIGGER FINGER RELEASE Right 08   Social History   Occupational History   Occupation: disability  Tobacco Use   Smoking status: Every Day    Packs/day: 0.75    Years: 20.00    Pack years: 15.00    Types: Cigarettes   Smokeless tobacco: Never   Tobacco comments:    taking wellbutrin to help  quitting  use vqpor cig now  Vaping Use   Vaping Use: Never used  Substance and Sexual Activity   Alcohol use: No   Drug use: No   Sexual activity: Not on file

## 2021-01-08 ENCOUNTER — Other Ambulatory Visit: Payer: Self-pay

## 2021-01-08 ENCOUNTER — Encounter: Payer: Self-pay | Admitting: Physical Medicine and Rehabilitation

## 2021-01-08 ENCOUNTER — Ambulatory Visit: Payer: Medicare HMO | Admitting: Physical Medicine and Rehabilitation

## 2021-01-08 ENCOUNTER — Ambulatory Visit: Payer: Self-pay

## 2021-01-08 DIAGNOSIS — G8929 Other chronic pain: Secondary | ICD-10-CM | POA: Diagnosis not present

## 2021-01-08 DIAGNOSIS — M25512 Pain in left shoulder: Secondary | ICD-10-CM

## 2021-01-08 DIAGNOSIS — M797 Fibromyalgia: Secondary | ICD-10-CM

## 2021-01-08 DIAGNOSIS — M7918 Myalgia, other site: Secondary | ICD-10-CM

## 2021-01-08 DIAGNOSIS — M542 Cervicalgia: Secondary | ICD-10-CM | POA: Diagnosis not present

## 2021-01-08 NOTE — Progress Notes (Signed)
Pt state neck pain that travels left shoulder. Pt state any movement makes the pain worse. Pt state she takes pain meds and uses heat to help ease her pain.  Numeric Pain Rating Scale and Functional Assessment Average Pain 10 Pain Right Now 8 My pain is constant, sharp, burning, stabbing, and aching Pain is worse with: bending, standing, and some activites Pain improves with: heat/ice, medication, and injections   In the last MONTH (on 0-10 scale) has pain interfered with the following?  1. General activity like being  able to carry out your everyday physical activities such as walking, climbing stairs, carrying groceries, or moving a chair?  Rating(7)  2. Relation with others like being able to carry out your usual social activities and roles such as  activities at home, at work and in your community. Rating(8)  3. Enjoyment of life such that you have  been bothered by emotional problems such as feeling anxious, depressed or irritable?  Rating(9)
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
## 2021-01-09 MED ORDER — TRIAMCINOLONE ACETONIDE 40 MG/ML IJ SUSP
40.0000 mg | INTRAMUSCULAR | Status: AC | PRN
Start: 2021-01-08 — End: 2021-01-08
  Administered 2021-01-08: 40 mg via INTRA_ARTICULAR

## 2021-01-09 MED ORDER — BUPIVACAINE HCL 0.25 % IJ SOLN
5.0000 mL | INTRAMUSCULAR | Status: AC | PRN
  Administered 2021-01-08: 5 mL via INTRA_ARTICULAR

## 2021-02-01 ENCOUNTER — Ambulatory Visit: Payer: Medicare HMO | Admitting: Rehabilitative and Restorative Service Providers"

## 2021-06-04 DIAGNOSIS — H9311 Tinnitus, right ear: Secondary | ICD-10-CM | POA: Insufficient documentation

## 2021-06-04 DIAGNOSIS — H903 Sensorineural hearing loss, bilateral: Secondary | ICD-10-CM | POA: Insufficient documentation

## 2021-08-07 ENCOUNTER — Encounter: Payer: Self-pay | Admitting: Orthopaedic Surgery

## 2021-08-07 ENCOUNTER — Ambulatory Visit (INDEPENDENT_AMBULATORY_CARE_PROVIDER_SITE_OTHER): Payer: Medicare HMO

## 2021-08-07 ENCOUNTER — Ambulatory Visit: Payer: Medicare HMO | Admitting: Orthopaedic Surgery

## 2021-08-07 DIAGNOSIS — M79604 Pain in right leg: Secondary | ICD-10-CM | POA: Diagnosis not present

## 2021-08-07 DIAGNOSIS — M5441 Lumbago with sciatica, right side: Secondary | ICD-10-CM

## 2021-08-07 DIAGNOSIS — G8929 Other chronic pain: Secondary | ICD-10-CM

## 2021-08-07 DIAGNOSIS — M79605 Pain in left leg: Secondary | ICD-10-CM | POA: Diagnosis not present

## 2021-08-07 MED ORDER — CYCLOBENZAPRINE HCL 10 MG PO TABS: 10.0000 mg | ORAL_TABLET | Freq: Three times a day (TID) | ORAL | 0 refills | Status: DC | PRN

## 2021-08-07 NOTE — Progress Notes (Signed)
Office Visit Note   Patient: Erica Mcdaniel           Date of Birth: 28-Aug-1970           MRN: [id number] Visit Date: 08/07/2021              Requested by: Suzan Garibaldi, FNP [address],  [place] [address] PCP: Suzan Garibaldi, FNP   Assessment & Plan: Visit Diagnoses:  1. Bilateral leg pain   2. Chronic right-sided low back pain with right-sided sciatica     Plan: Mrs. Behl has a chronic history of low back pain.  In the past she has been seen by Dr. Ellene Route and years ago had an L5-S1 fusion.  She has had an MRI scan performed of her lumbar spine in 2019 that demonstrated areas of stenosis centrally at L3-4 and more at L4-5.  She was apparently scheduled to have an epidural steroid injection but with COVID it was canceled.  In the interim she moved to [place] and had a left knee replacement but now is back in [place] to help care for her aging and infirmed father.  She has had an exacerbation of her back pain that is predominately localized to the right side of her buttock but does have referred pain into her right lower extremity.  No significant changes on her x-ray today other than some narrowing of the L4-5 disc space with an anterior listhesis.  Based on her past history and present symptoms I think an epidural steroid injection would be a good approach.  We will schedule this for her and check her back in the next 3 to 4 weeks.  Also will prescribe Flexeril  Follow-Up Instructions: Return in about 1 month (around 09/07/2021).   Orders:  Orders Placed This Encounter  Procedures   XR Lumbar Spine 2-3 Views   No orders of the defined types were placed in this encounter.     Procedures: No procedures performed   Clinical Data: No additional findings.   Subjective: Chief Complaint  Patient presents with   Right Leg - Pain  Exacerbation of her chronic low back pain.  Past history is significant for prior L5-S1 fusion by Dr. Ellene Route.  Mrs. Groom had  moved to [place] several years ago and had her left knee replaced without a problem.  She is now back in [place] to help care for her father and has had an exacerbation of her back pain with discomfort in her right buttock and into her right leg particularly when she is up and about.  No bowel or bladder changes.  No recent injury or trauma  HPI  Review of Systems   Objective: Vital Signs: There were no vitals taken for this visit.  Physical Exam Constitutional:      Appearance: She is well-developed.  Eyes:     Pupils: Pupils are equal, round, and reactive to light.  Pulmonary:     Effort: Pulmonary effort is normal.  Skin:    General: Skin is warm and dry.  Neurological:     Mental Status: She is alert and oriented to person, place, and time.  Psychiatric:        Behavior: Behavior normal.     Ortho Exam no pain with range of motion of her right hip although on occasion she has some groin pain when she is up and about.  No loss of motion.  No right knee pain or effusion.  Straight leg raise was  minimally positive for back discomfort.  Motor exam appeared to be intact.  Some percussible tenderness in the lower lumbar spine in the area of the right SI joint  Specialty Comments:  No specialty comments available.  Imaging: XR Lumbar Spine 2-3 Views  Result Date: 08/07/2021 Films of the lumbar spine obtained in the AP and lateral projections.  There is been a prior fusion at L5-S1.  There is a slight anterior listhesis of 4 on 5 with narrowing of the disc space and some facet sclerosis.  Very mild narrowing of the L3-4 disc space but no listhesis.  Mild calcification of the abdominal aorta.  No acute changes.  No scoliosis    PMFS History: Patient Active Problem List   Diagnosis Date Noted   S/P TKR (total knee replacement) 08/30/2020   Pain in left knee 07/28/2018   Unilateral primary osteoarthritis, left knee 07/28/2018   Lateral epicondylitis of right elbow 12/08/2012    Ventral hernia 11/02/2010   Abnormal intestinal absorption  11/02/2010   Abdominal pain 11/02/2010   Fatigue 11/02/2010   Personal history of colonic polyps 11/02/2010   GERD (gastroesophageal reflux disease) 11/02/2010   GERD 08/06/2007   IBS 08/06/2007   ABDOMINAL PAIN-RUQ 08/06/2007   OBESITY 08/05/2007   ANXIETY 08/05/2007   SOMATIZATION DISORDER 08/05/2007   OVARIAN CYST 08/05/2007   PMS 08/05/2007   DYSFUNCTIONAL UTERINE BLEEDING 08/05/2007   FEMALE INFERTILITY 08/05/2007   DEGENERATIVE JOINT DISEASE 08/05/2007   OSTEOARTHRITIS, KNEES, BILATERAL 08/05/2007   KNEE PAIN, BILATERAL 08/05/2007   DEGENERATIVE DISC DISEASE 08/05/2007   LOW BACK PAIN, CHRONIC 08/05/2007   FIBROMYALGIA 08/05/2007   INSOMNIA 08/05/2007   PAP SMEAR, ABNORMAL 08/05/2007   Past Medical History:  Diagnosis Date   Anxiety    Arthritis    Cancer ([place])    skin squamous cell arm   Depression    Fibromyalgia    GERD (gastroesophageal reflux disease)    Hypertension    IBS (irritable bowel syndrome)    Pneumonia    hx   Polycystic ovarian disease    Tendonitis    Tubular adenoma of colon 2018   multiple    Family History  Problem Relation Age of Onset   Heart disease Brother    Arthritis Brother    Lung cancer Mother        meso   Arthritis Mother    Hyperlipidemia Brother    Arthritis Sister    Colon polyps Brother    Thyroid disease Father    Arthritis Father    Diabetes Maternal Grandmother    Colon cancer Neg Hx    Uterine cancer Neg Hx    Stomach cancer Neg Hx    Rectal cancer Neg Hx     Past Surgical History:  Procedure Laterality Date   APPENDECTOMY     BACK SURGERY     BACK SURGERY  2010   rods put in   KNEE ARTHROSCOPY Left 13   NEUROPLASTY / TRANSPOSITION MEDIAN NERVE AT CARPAL TUNNEL BILATERAL     OVARIAN CYST SURGERY  1983   ROTATOR CUFF REPAIR  2003,04   right, and elbow,left   TENDON RECONSTRUCTION Right 12/08/2012   Procedure: ELBOW TENDON  RECONSTRUCTION/Right Lateral Epicondylar Debridement with Repair.;  Surgeon: Garald Balding, MD;  Location: [facility];  Service: Orthopedics;  Laterality: Right;  Right Lateral Epicondylar Debridement with Repair.   TRIGGER FINGER RELEASE Right 08   Social History   Occupational History   Occupation: disability  Tobacco Use   Smoking status: Every Day    Packs/day: 0.75    Years: 20.00    Total pack years: 15.00    Types: Cigarettes   Smokeless tobacco: Never   Tobacco comments:    taking wellbutrin to help quitting  use vqpor cig now  Vaping Use   Vaping Use: Never used  Substance and Sexual Activity   Alcohol use: No   Drug use: No   Sexual activity: Not on file     Garald Balding, MD   Note - This record has been created using Editor, commissioning.  Chart creation errors have been sought, but may not always  have been located. Such creation errors do not reflect on  the standard of medical care.

## 2021-08-08 ENCOUNTER — Telehealth: Payer: Self-pay | Admitting: Orthopaedic Surgery

## 2021-08-08 ENCOUNTER — Other Ambulatory Visit: Payer: Self-pay | Admitting: Physician Assistant

## 2021-08-08 MED ORDER — CYCLOBENZAPRINE HCL 10 MG PO TABS: 10.0000 mg | ORAL_TABLET | Freq: Three times a day (TID) | ORAL | 0 refills | Status: DC | PRN

## 2021-08-08 NOTE — Telephone Encounter (Signed)
Tried calling to advise done. NO answer.

## 2021-08-08 NOTE — Addendum Note (Signed)
Addended by: Jacklyn Shell on: 08/08/2021 11:32 AM   Modules accepted: Orders

## 2021-08-08 NOTE — Telephone Encounter (Signed)
Pt called stating her script of flexeril was sent to a pharmacy in [place]. Pt is asking for her prescriptions to go to the pharmacy on file which is [facility] in [place]. Please send as soon as possible. Pt phone number is

## 2021-08-09 ENCOUNTER — Ambulatory Visit
Admission: RE | Admit: 2021-08-09 | Discharge: 2021-08-09 | Disposition: A | Discharge status: Home / Self Care | Payer: Medicare HMO | Source: Ambulatory Visit | Attending: Orthopaedic Surgery | Admitting: Orthopaedic Surgery

## 2021-08-09 DIAGNOSIS — G8929 Other chronic pain: Secondary | ICD-10-CM

## 2021-08-09 DIAGNOSIS — M79604 Pain in right leg: Secondary | ICD-10-CM

## 2021-08-09 MED ORDER — IOPAMIDOL (ISOVUE-M 200) INJECTION 41%
1.0000 mL | Freq: Once | INTRAMUSCULAR | Status: AC
  Administered 2021-08-09: 1 mL via EPIDURAL

## 2021-08-09 MED ORDER — METHYLPREDNISOLONE ACETATE 40 MG/ML INJ SUSP (RADIOLOG
80.0000 mg | Freq: Once | INTRAMUSCULAR | Status: AC
  Administered 2021-08-09: 80 mg via EPIDURAL

## 2021-08-09 NOTE — Discharge Instructions (Signed)

## 2021-08-30 ENCOUNTER — Ambulatory Visit: Payer: Medicare HMO | Admitting: Orthopaedic Surgery

## 2021-09-25 ENCOUNTER — Inpatient Hospital Stay (HOSPITAL_COMMUNITY): Payer: Medicare HMO

## 2021-09-25 ENCOUNTER — Emergency Department (HOSPITAL_COMMUNITY): Payer: Medicare HMO

## 2021-09-25 ENCOUNTER — Encounter (HOSPITAL_COMMUNITY): Payer: Self-pay | Admitting: Diagnostic Radiology

## 2021-09-25 ENCOUNTER — Emergency Department (HOSPITAL_COMMUNITY): Payer: Medicare HMO | Admitting: Anesthesiology

## 2021-09-25 ENCOUNTER — Inpatient Hospital Stay (HOSPITAL_COMMUNITY)
Admission: EM | Admit: 2021-09-25 | Discharge: 2021-10-10 | DRG: 023 | Disposition: A | Discharge status: Skilled Nursing Facility | Payer: Medicare HMO | Source: Other Acute Inpatient Hospital | Attending: Internal Medicine | Admitting: Internal Medicine

## 2021-09-25 ENCOUNTER — Encounter (HOSPITAL_COMMUNITY)
Admission: EM | Disposition: A | Discharge status: Skilled Nursing Facility | Payer: Self-pay | Source: Other Acute Inpatient Hospital | Attending: Neurology

## 2021-09-25 DIAGNOSIS — Z881 Allergy status to other antibiotic agents status: Secondary | ICD-10-CM

## 2021-09-25 DIAGNOSIS — J9601 Acute respiratory failure with hypoxia: Secondary | ICD-10-CM | POA: Diagnosis not present

## 2021-09-25 DIAGNOSIS — E785 Hyperlipidemia, unspecified: Secondary | ICD-10-CM | POA: Diagnosis present

## 2021-09-25 DIAGNOSIS — W19XXXA Unspecified fall, initial encounter: Secondary | ICD-10-CM | POA: Diagnosis present

## 2021-09-25 DIAGNOSIS — I639 Cerebral infarction, unspecified: Secondary | ICD-10-CM | POA: Diagnosis not present

## 2021-09-25 DIAGNOSIS — R29810 Facial weakness: Secondary | ICD-10-CM | POA: Diagnosis present

## 2021-09-25 DIAGNOSIS — M5441 Lumbago with sciatica, right side: Secondary | ICD-10-CM | POA: Diagnosis present

## 2021-09-25 DIAGNOSIS — R000 Tachycardia, unspecified: Secondary | ICD-10-CM | POA: Diagnosis not present

## 2021-09-25 DIAGNOSIS — K219 Gastro-esophageal reflux disease without esophagitis: Secondary | ICD-10-CM | POA: Diagnosis present

## 2021-09-25 DIAGNOSIS — I63232 Cerebral infarction due to unspecified occlusion or stenosis of left carotid arteries: Secondary | ICD-10-CM

## 2021-09-25 DIAGNOSIS — D72829 Elevated white blood cell count, unspecified: Secondary | ICD-10-CM

## 2021-09-25 DIAGNOSIS — Z6841 Body Mass Index (BMI) 40.0 and over, adult: Secondary | ICD-10-CM | POA: Diagnosis not present

## 2021-09-25 DIAGNOSIS — I63412 Cerebral infarction due to embolism of left middle cerebral artery: Secondary | ICD-10-CM | POA: Diagnosis present

## 2021-09-25 DIAGNOSIS — Z8673 Personal history of transient ischemic attack (TIA), and cerebral infarction without residual deficits: Secondary | ICD-10-CM | POA: Diagnosis not present

## 2021-09-25 DIAGNOSIS — I63423 Cerebral infarction due to embolism of bilateral anterior cerebral arteries: Secondary | ICD-10-CM | POA: Diagnosis present

## 2021-09-25 DIAGNOSIS — Z713 Dietary counseling and surveillance: Secondary | ICD-10-CM

## 2021-09-25 DIAGNOSIS — Z85828 Personal history of other malignant neoplasm of skin: Secondary | ICD-10-CM

## 2021-09-25 DIAGNOSIS — M797 Fibromyalgia: Secondary | ICD-10-CM | POA: Diagnosis present

## 2021-09-25 DIAGNOSIS — I63512 Cerebral infarction due to unspecified occlusion or stenosis of left middle cerebral artery: Principal | ICD-10-CM | POA: Diagnosis present

## 2021-09-25 DIAGNOSIS — F32A Depression, unspecified: Secondary | ICD-10-CM | POA: Diagnosis present

## 2021-09-25 DIAGNOSIS — E662 Morbid (severe) obesity with alveolar hypoventilation: Secondary | ICD-10-CM | POA: Diagnosis present

## 2021-09-25 DIAGNOSIS — Z7902 Long term (current) use of antithrombotics/antiplatelets: Secondary | ICD-10-CM

## 2021-09-25 DIAGNOSIS — G8191 Hemiplegia, unspecified affecting right dominant side: Secondary | ICD-10-CM | POA: Diagnosis present

## 2021-09-25 DIAGNOSIS — J449 Chronic obstructive pulmonary disease, unspecified: Secondary | ICD-10-CM | POA: Diagnosis present

## 2021-09-25 DIAGNOSIS — E669 Obesity, unspecified: Secondary | ICD-10-CM | POA: Diagnosis not present

## 2021-09-25 DIAGNOSIS — R414 Neurologic neglect syndrome: Secondary | ICD-10-CM | POA: Diagnosis present

## 2021-09-25 DIAGNOSIS — R4701 Aphasia: Secondary | ICD-10-CM | POA: Diagnosis present

## 2021-09-25 DIAGNOSIS — E870 Hyperosmolality and hypernatremia: Secondary | ICD-10-CM | POA: Diagnosis present

## 2021-09-25 DIAGNOSIS — G8929 Other chronic pain: Secondary | ICD-10-CM | POA: Diagnosis present

## 2021-09-25 DIAGNOSIS — I10 Essential (primary) hypertension: Secondary | ICD-10-CM | POA: Diagnosis present

## 2021-09-25 DIAGNOSIS — H518 Other specified disorders of binocular movement: Secondary | ICD-10-CM | POA: Diagnosis present

## 2021-09-25 DIAGNOSIS — Y92009 Unspecified place in unspecified non-institutional (private) residence as the place of occurrence of the external cause: Secondary | ICD-10-CM | POA: Diagnosis not present

## 2021-09-25 DIAGNOSIS — R1310 Dysphagia, unspecified: Secondary | ICD-10-CM

## 2021-09-25 DIAGNOSIS — R471 Dysarthria and anarthria: Secondary | ICD-10-CM | POA: Diagnosis present

## 2021-09-25 DIAGNOSIS — Z7982 Long term (current) use of aspirin: Secondary | ICD-10-CM

## 2021-09-25 DIAGNOSIS — E782 Mixed hyperlipidemia: Secondary | ICD-10-CM | POA: Diagnosis not present

## 2021-09-25 DIAGNOSIS — Z8249 Family history of ischemic heart disease and other diseases of the circulatory system: Secondary | ICD-10-CM

## 2021-09-25 DIAGNOSIS — K589 Irritable bowel syndrome without diarrhea: Secondary | ICD-10-CM | POA: Diagnosis present

## 2021-09-25 DIAGNOSIS — Z79891 Long term (current) use of opiate analgesic: Secondary | ICD-10-CM

## 2021-09-25 DIAGNOSIS — F17200 Nicotine dependence, unspecified, uncomplicated: Secondary | ICD-10-CM | POA: Diagnosis not present

## 2021-09-25 DIAGNOSIS — Z79899 Other long term (current) drug therapy: Secondary | ICD-10-CM

## 2021-09-25 DIAGNOSIS — Z716 Tobacco abuse counseling: Secondary | ICD-10-CM

## 2021-09-25 DIAGNOSIS — R29725 NIHSS score 25: Secondary | ICD-10-CM | POA: Diagnosis present

## 2021-09-25 DIAGNOSIS — F1290 Cannabis use, unspecified, uncomplicated: Secondary | ICD-10-CM | POA: Diagnosis present

## 2021-09-25 DIAGNOSIS — E538 Deficiency of other specified B group vitamins: Secondary | ICD-10-CM | POA: Diagnosis present

## 2021-09-25 DIAGNOSIS — F17210 Nicotine dependence, cigarettes, uncomplicated: Secondary | ICD-10-CM | POA: Diagnosis present

## 2021-09-25 DIAGNOSIS — F419 Anxiety disorder, unspecified: Secondary | ICD-10-CM | POA: Diagnosis present

## 2021-09-25 DIAGNOSIS — I6523 Occlusion and stenosis of bilateral carotid arteries: Secondary | ICD-10-CM

## 2021-09-25 DIAGNOSIS — R1312 Dysphagia, oropharyngeal phase: Secondary | ICD-10-CM | POA: Diagnosis not present

## 2021-09-25 DIAGNOSIS — T17908A Unspecified foreign body in respiratory tract, part unspecified causing other injury, initial encounter: Secondary | ICD-10-CM | POA: Diagnosis not present

## 2021-09-25 DIAGNOSIS — Z720 Tobacco use: Secondary | ICD-10-CM | POA: Diagnosis present

## 2021-09-25 DIAGNOSIS — M1990 Unspecified osteoarthritis, unspecified site: Secondary | ICD-10-CM | POA: Diagnosis present

## 2021-09-25 DIAGNOSIS — Z006 Encounter for examination for normal comparison and control in clinical research program: Secondary | ICD-10-CM | POA: Diagnosis not present

## 2021-09-25 DIAGNOSIS — E282 Polycystic ovarian syndrome: Secondary | ICD-10-CM | POA: Diagnosis present

## 2021-09-25 HISTORY — PX: IR CT HEAD LTD: IMG2386

## 2021-09-25 HISTORY — PX: RADIOLOGY WITH ANESTHESIA: SHX6223

## 2021-09-25 HISTORY — DX: Cerebral infarction due to unspecified occlusion or stenosis of left middle cerebral artery: I63.512

## 2021-09-25 HISTORY — PX: IR ANGIO INTRA EXTRACRAN SEL COM CAROTID INNOMINATE UNI R MOD SED: IMG5359

## 2021-09-25 HISTORY — PX: IR US GUIDE VASC ACCESS RIGHT: IMG2390

## 2021-09-25 HISTORY — PX: IR US GUIDE VASC ACCESS LEFT: IMG2389

## 2021-09-25 HISTORY — PX: IR INTRAVSC STENT CERV CAROTID W/O EMB-PROT MOD SED INC ANGIO: IMG2304

## 2021-09-25 HISTORY — PX: IR PERCUTANEOUS ART THROMBECTOMY/INFUSION INTRACRANIAL INC DIAG ANGIO: IMG6087

## 2021-09-25 LAB — COMPREHENSIVE METABOLIC PANEL
ALT: 31 U/L (ref 0–44)
AST: 39 U/L (ref 15–41)
Albumin: 3.8 g/dL (ref 3.5–5.0)
Alkaline Phosphatase: 73 U/L (ref 38–126)
Anion gap: 11 (ref 5–15)
BUN: 10 mg/dL (ref 6–20)
CO2: 24 mmol/L (ref 22–32)
Calcium: 9.4 mg/dL (ref 8.9–10.3)
Chloride: 105 mmol/L (ref 98–111)
Creatinine, Ser: 0.9 mg/dL (ref 0.44–1.00)
GFR, Estimated: >60 mL/min (ref >60)
Glucose, Bld: 135 mg/dL — ABNORMAL HIGH (ref 70–99)
Potassium: 3.2 mmol/L — ABNORMAL LOW (ref 3.5–5.1)
Sodium: 140 mmol/L (ref 135–145)
Total Bilirubin: 0.3 mg/dL (ref 0.3–1.2)
Total Protein: 6.6 g/dL (ref 6.5–8.1)

## 2021-09-25 LAB — PROTIME-INR
INR: 0.9 (ref 0.8–1.2)
Prothrombin Time: 12.3 seconds (ref 11.4–15.2)

## 2021-09-25 LAB — DIFFERENTIAL
Abs Immature Granulocytes: 0 10*3/uL (ref 0.00–0.07)
Basophils Absolute: 0.1 10*3/uL (ref 0.0–0.1)
Basophils Relative: 1 %
Eosinophils Absolute: 0.9 10*3/uL — ABNORMAL HIGH (ref 0.0–0.5)
Eosinophils Relative: 6 %
Lymphocytes Relative: 39 %
Lymphs Abs: 5.5 10*3/uL — ABNORMAL HIGH (ref 0.7–4.0)
Monocytes Absolute: 0.9 10*3/uL (ref 0.1–1.0)
Monocytes Relative: 6 %
Neutro Abs: 6.8 10*3/uL (ref 1.7–7.7)
Neutrophils Relative %: 48 %
nRBC: 0 /100 WBC

## 2021-09-25 LAB — RAPID URINE DRUG SCREEN, HOSP PERFORMED
Amphetamines: NOT DETECTED
Barbiturates: NOT DETECTED
Benzodiazepines: NOT DETECTED
Cocaine: NOT DETECTED
Opiates: NOT DETECTED
Tetrahydrocannabinol: POSITIVE — AB

## 2021-09-25 LAB — POCT I-STAT 7, (LYTES, BLD GAS, ICA,H+H)
Acid-base deficit: 7 mmol/L — ABNORMAL HIGH (ref 0.0–2.0)
Acid-base deficit: 1 mmol/L (ref 0.0–2.0)
Bicarbonate: 19.4 mmol/L — ABNORMAL LOW (ref 20.0–28.0)
Bicarbonate: 24.3 mmol/L (ref 20.0–28.0)
Calcium, Ion: 1.09 mmol/L — ABNORMAL LOW (ref 1.15–1.40)
Calcium, Ion: 1.14 mmol/L — ABNORMAL LOW (ref 1.15–1.40)
HCT: 41 % (ref 36.0–46.0)
HCT: 40 % (ref 36.0–46.0)
Hemoglobin: 13.9 g/dL (ref 12.0–15.0)
Hemoglobin: 13.6 g/dL (ref 12.0–15.0)
O2 Saturation: 94 %
O2 Saturation: 100 %
Patient temperature: 96.6
Potassium: 3.4 mmol/L — ABNORMAL LOW (ref 3.5–5.1)
Potassium: 3.5 mmol/L (ref 3.5–5.1)
Sodium: 142 mmol/L (ref 135–145)
Sodium: 143 mmol/L (ref 135–145)
TCO2: 21 mmol/L — ABNORMAL LOW (ref 22–32)
TCO2: 25 mmol/L (ref 22–32)
pCO2 arterial: 42.3 mmHg (ref 32–48)
pCO2 arterial: 38.7 mmHg (ref 32–48)
pH, Arterial: 7.27 — ABNORMAL LOW (ref 7.35–7.45)
pH, Arterial: 7.4 (ref 7.35–7.45)
pO2, Arterial: 79 mmHg — ABNORMAL LOW (ref 83–108)
pO2, Arterial: 189 mmHg — ABNORMAL HIGH (ref 83–108)

## 2021-09-25 LAB — HIV ANTIBODY (ROUTINE TESTING W REFLEX): HIV Screen 4th Generation wRfx: NONREACTIVE

## 2021-09-25 LAB — URINALYSIS, ROUTINE W REFLEX MICROSCOPIC
Bilirubin Urine: NEGATIVE
Glucose, UA: NEGATIVE mg/dL
Hgb urine dipstick: NEGATIVE
Ketones, ur: NEGATIVE mg/dL
Leukocytes,Ua: NEGATIVE
Nitrite: NEGATIVE
Protein, ur: NEGATIVE mg/dL
Specific Gravity, Urine: 1.015 (ref 1.005–1.030)
pH: 6 (ref 5.0–8.0)

## 2021-09-25 LAB — CBC
HCT: 42.7 % (ref 36.0–46.0)
Hemoglobin: 14.1 g/dL (ref 12.0–15.0)
MCH: 30 pg (ref 26.0–34.0)
MCHC: 33 g/dL (ref 30.0–36.0)
MCV: 90.9 fL (ref 80.0–100.0)
Platelets: 388 10*3/uL (ref 150–400)
RBC: 4.7 MIL/uL (ref 3.87–5.11)
RDW: 13.3 % (ref 11.5–15.5)
WBC: 14.2 10*3/uL — ABNORMAL HIGH (ref 4.0–10.5)
nRBC: 0 % (ref 0.0–0.2)

## 2021-09-25 LAB — CBG MONITORING, ED: Glucose-Capillary: 155 mg/dL — ABNORMAL HIGH (ref 70–99)

## 2021-09-25 LAB — ECHOCARDIOGRAM COMPLETE
AR max vel: 2.27 cm2
AV Area VTI: 2.33 cm2
AV Area mean vel: 2.11 cm2
AV Mean grad: 9 mmHg
AV Peak grad: 16.5 mmHg
Ao pk vel: 2.03 m/s
Area-P 1/2: 2.91 cm2
Height: 68 in
MV VTI: 2.51 cm2
S' Lateral: 3 cm
Weight: 4373.93 oz

## 2021-09-25 LAB — I-STAT CHEM 8, ED
BUN: 13 mg/dL (ref 6–20)
Calcium, Ion: 1.11 mmol/L — ABNORMAL LOW (ref 1.15–1.40)
Chloride: 102 mmol/L (ref 98–111)
Creatinine, Ser: 0.7 mg/dL (ref 0.44–1.00)
Glucose, Bld: 135 mg/dL — ABNORMAL HIGH (ref 70–99)
HCT: 43 % (ref 36.0–46.0)
Hemoglobin: 14.6 g/dL (ref 12.0–15.0)
Potassium: 3.6 mmol/L (ref 3.5–5.1)
Sodium: 139 mmol/L (ref 135–145)
TCO2: 26 mmol/L (ref 22–32)

## 2021-09-25 LAB — HEMOGLOBIN A1C
Hgb A1c MFr Bld: 5.8 % — ABNORMAL HIGH (ref 4.8–5.6)
Mean Plasma Glucose: 119.76 mg/dL

## 2021-09-25 LAB — I-STAT BETA HCG BLOOD, ED (MC, WL, AP ONLY): I-stat hCG, quantitative: 7 m[IU]/mL — ABNORMAL HIGH (ref <5)

## 2021-09-25 LAB — APTT: aPTT: 24 seconds (ref 24–36)

## 2021-09-25 LAB — MRSA NEXT GEN BY PCR, NASAL: MRSA by PCR Next Gen: NOT DETECTED

## 2021-09-25 LAB — ETHANOL: Alcohol, Ethyl (B): <10 mg/dL (ref <10)

## 2021-09-25 SURGERY — IR WITH ANESTHESIA
Anesthesia: General

## 2021-09-25 MED ORDER — ASPIRIN 81 MG PO CHEW
CHEWABLE_TABLET | ORAL | Status: AC
  Filled 2021-09-25: qty 1

## 2021-09-25 MED ORDER — CLEVIDIPINE BUTYRATE 0.5 MG/ML IV EMUL
INTRAVENOUS | Status: AC
  Filled 2021-09-25: qty 50

## 2021-09-25 MED ORDER — CLEVIDIPINE BUTYRATE 0.5 MG/ML IV EMUL
0.0000 mg/h | INTRAVENOUS | Status: DC
  Administered 2021-09-25: 4 mg/h via INTRAVENOUS
  Administered 2021-09-25: 21 mg/h via INTRAVENOUS
  Filled 2021-09-25: qty 50

## 2021-09-25 MED ORDER — PANTOPRAZOLE 2 MG/ML SUSPENSION
40.0000 mg | Freq: Every day | ORAL | Status: DC
  Administered 2021-09-25 – 2021-10-10 (×15): 40 mg
  Filled 2021-09-25 (×15): qty 20

## 2021-09-25 MED ORDER — CLEVIDIPINE BUTYRATE 0.5 MG/ML IV EMUL
INTRAVENOUS | Status: DC | PRN
  Administered 2021-09-25: 2 mg/h via INTRAVENOUS
  Administered 2021-09-25: 18 mg/h via INTRAVENOUS
  Administered 2021-09-25: 12 mg/h via INTRAVENOUS

## 2021-09-25 MED ORDER — EPTIFIBATIDE 20 MG/10ML IV SOLN
INTRAVENOUS | Status: AC
  Filled 2021-09-25: qty 10

## 2021-09-25 MED ORDER — FENTANYL CITRATE (PF) 100 MCG/2ML IJ SOLN
INTRAMUSCULAR | Status: DC | PRN
  Administered 2021-09-25 (×2): 50 ug via INTRAVENOUS

## 2021-09-25 MED ORDER — SODIUM CHLORIDE 0.9 % IV SOLN: INTRAVENOUS | Status: AC

## 2021-09-25 MED ORDER — VERAPAMIL HCL 2.5 MG/ML IV SOLN
INTRAVENOUS | Status: AC
  Filled 2021-09-25: qty 2

## 2021-09-25 MED ORDER — PHENYLEPHRINE HCL-NACL 20-0.9 MG/250ML-% IV SOLN
INTRAVENOUS | Status: DC | PRN
  Administered 2021-09-25: 25 ug/min via INTRAVENOUS

## 2021-09-25 MED ORDER — IPRATROPIUM-ALBUTEROL 0.5-2.5 (3) MG/3ML IN SOLN
3.0000 mL | Freq: Two times a day (BID) | RESPIRATORY_TRACT | Status: DC
  Administered 2021-09-25 – 2021-10-01 (×12): 3 mL via RESPIRATORY_TRACT
  Filled 2021-09-25 (×12): qty 3

## 2021-09-25 MED ORDER — PROPOFOL 1000 MG/100ML IV EMUL
0.0000 ug/kg/min | INTRAVENOUS | Status: DC
  Administered 2021-09-25: 10 ug/kg/min via INTRAVENOUS
  Administered 2021-09-25 (×3): 40 ug/kg/min via INTRAVENOUS
  Administered 2021-09-26 (×2): 50 ug/kg/min via INTRAVENOUS
  Administered 2021-09-26: 30 ug/kg/min via INTRAVENOUS
  Administered 2021-09-26: 45 ug/kg/min via INTRAVENOUS
  Filled 2021-09-25: qty 200
  Filled 2021-09-25 (×5): qty 100

## 2021-09-25 MED ORDER — FENTANYL CITRATE PF 50 MCG/ML IJ SOSY
50.0000 ug | PREFILLED_SYRINGE | INTRAMUSCULAR | Status: DC | PRN
  Administered 2021-09-26 (×3): 100 ug via INTRAVENOUS
  Filled 2021-09-25 (×3): qty 2

## 2021-09-25 MED ORDER — ADULT MULTIVITAMIN W/MINERALS CH
1.0000 | ORAL_TABLET | Freq: Every day | ORAL | Status: DC
  Administered 2021-09-27 – 2021-10-10 (×14): 1
  Filled 2021-09-25 (×14): qty 1

## 2021-09-25 MED ORDER — SODIUM CHLORIDE 0.9 % IV SOLN
INTRAVENOUS | Status: AC | PRN
  Administered 2021-09-25: 2 ug/kg/min via INTRAVENOUS

## 2021-09-25 MED ORDER — SENNOSIDES-DOCUSATE SODIUM 8.6-50 MG PO TABS: 1.0000 | ORAL_TABLET | Freq: Every evening | ORAL | Status: DC | PRN

## 2021-09-25 MED ORDER — CHLORHEXIDINE GLUCONATE CLOTH 2 % EX PADS
6.0000 | MEDICATED_PAD | Freq: Every day | CUTANEOUS | Status: DC
  Administered 2021-09-25 – 2021-10-10 (×16): 6 via TOPICAL

## 2021-09-25 MED ORDER — ADULT MULTIVITAMIN W/MINERALS CH: 1.0000 | ORAL_TABLET | Freq: Every day | ORAL | Status: DC

## 2021-09-25 MED ORDER — IOHEXOL 300 MG/ML  SOLN
100.0000 mL | Freq: Once | INTRAMUSCULAR | Status: AC | PRN
  Administered 2021-09-25: 60 mL via INTRA_ARTERIAL

## 2021-09-25 MED ORDER — PROPOFOL 500 MG/50ML IV EMUL
INTRAVENOUS | Status: DC | PRN
  Administered 2021-09-25: 50 ug/kg/min via INTRAVENOUS

## 2021-09-25 MED ORDER — SODIUM CHLORIDE 0.9 % IV SOLN: INTRAVENOUS | Status: DC | PRN

## 2021-09-25 MED ORDER — ORAL CARE MOUTH RINSE: 15.0000 mL | OROMUCOSAL | Status: DC | PRN

## 2021-09-25 MED ORDER — ACETAMINOPHEN 650 MG RE SUPP: 650.0000 mg | RECTAL | Status: DC | PRN

## 2021-09-25 MED ORDER — SUCCINYLCHOLINE CHLORIDE 20 MG/ML IJ SOLN
INTRAMUSCULAR | Status: DC | PRN
  Administered 2021-09-25: 160 mg via INTRAVENOUS

## 2021-09-25 MED ORDER — ORAL CARE MOUTH RINSE
15.0000 mL | OROMUCOSAL | Status: DC
  Administered 2021-09-25 – 2021-09-26 (×12): 15 mL via OROMUCOSAL

## 2021-09-25 MED ORDER — ACETAMINOPHEN 325 MG PO TABS
650.0000 mg | ORAL_TABLET | ORAL | Status: DC | PRN
  Administered 2021-10-10: 650 mg via ORAL
  Filled 2021-09-25 (×2): qty 2

## 2021-09-25 MED ORDER — FENTANYL CITRATE PF 50 MCG/ML IJ SOSY
50.0000 ug | PREFILLED_SYRINGE | INTRAMUSCULAR | Status: DC | PRN
  Filled 2021-09-25: qty 1

## 2021-09-25 MED ORDER — ACETAMINOPHEN 160 MG/5ML PO SOLN: 650.0000 mg | ORAL | Status: DC | PRN

## 2021-09-25 MED ORDER — ALBUTEROL SULFATE (2.5 MG/3ML) 0.083% IN NEBU: 2.5000 mg | INHALATION_SOLUTION | RESPIRATORY_TRACT | Status: DC | PRN

## 2021-09-25 MED ORDER — TICAGRELOR 60 MG PO TABS
ORAL_TABLET | ORAL | Status: AC | PRN
  Administered 2021-09-25: 180 mg

## 2021-09-25 MED ORDER — IOHEXOL 350 MG/ML SOLN
100.0000 mL | Freq: Once | INTRAVENOUS | Status: AC | PRN
Start: 2021-09-25 — End: 2021-09-25
  Administered 2021-09-25: 60 mL via INTRAVENOUS

## 2021-09-25 MED ORDER — ENOXAPARIN SODIUM 40 MG/0.4ML IJ SOSY
40.0000 mg | PREFILLED_SYRINGE | INTRAMUSCULAR | Status: DC
  Administered 2021-09-25 – 2021-10-10 (×16): 40 mg via SUBCUTANEOUS
  Filled 2021-09-25 (×16): qty 0.4

## 2021-09-25 MED ORDER — POLYETHYLENE GLYCOL 3350 17 G PO PACK
17.0000 g | PACK | Freq: Every day | ORAL | Status: DC
  Administered 2021-09-27 – 2021-10-08 (×11): 17 g
  Filled 2021-09-25 (×10): qty 1

## 2021-09-25 MED ORDER — EPTIFIBATIDE 20 MG/10ML IV SOLN
INTRAVENOUS | Status: AC | PRN
  Administered 2021-09-25 (×2): 1.5 mg via INTRA_ARTERIAL

## 2021-09-25 MED ORDER — LIDOCAINE HCL (CARDIAC) PF 50 MG/5ML IV SOSY
PREFILLED_SYRINGE | INTRAVENOUS | Status: DC | PRN
  Administered 2021-09-25: 100 mg via INTRAVENOUS

## 2021-09-25 MED ORDER — DOCUSATE SODIUM 50 MG/5ML PO LIQD
100.0000 mg | Freq: Two times a day (BID) | ORAL | Status: DC
  Administered 2021-09-25 – 2021-10-09 (×25): 100 mg
  Filled 2021-09-25 (×26): qty 10

## 2021-09-25 MED ORDER — HYDRALAZINE HCL 20 MG/ML IJ SOLN
20.0000 mg | Freq: Four times a day (QID) | INTRAMUSCULAR | Status: DC | PRN
  Administered 2021-09-25: 20 mg via INTRAVENOUS
  Filled 2021-09-25: qty 1

## 2021-09-25 MED ORDER — ASPIRIN 81 MG PO CHEW
CHEWABLE_TABLET | ORAL | Status: AC | PRN
  Administered 2021-09-25: 81 mg

## 2021-09-25 MED ORDER — PROPOFOL 10 MG/ML IV BOLUS
INTRAVENOUS | Status: DC | PRN
  Administered 2021-09-25: 50 mg via INTRAVENOUS
  Administered 2021-09-25: 20 mg via INTRAVENOUS
  Administered 2021-09-25 (×2): 25 mg via INTRAVENOUS
  Administered 2021-09-25: 40 mg via INTRAVENOUS
  Administered 2021-09-25: 20 mg via INTRAVENOUS
  Administered 2021-09-25: 30 mg via INTRAVENOUS
  Administered 2021-09-25: 150 mg via INTRAVENOUS
  Administered 2021-09-25: 20 mg via INTRAVENOUS
  Administered 2021-09-25: 50 mg via INTRAVENOUS
  Administered 2021-09-25: 30 mg via INTRAVENOUS

## 2021-09-25 MED ORDER — ROCURONIUM BROMIDE 100 MG/10ML IV SOLN
INTRAVENOUS | Status: DC | PRN
  Administered 2021-09-25 (×3): 20 mg via INTRAVENOUS
  Administered 2021-09-25: 70 mg via INTRAVENOUS
  Administered 2021-09-25: 30 mg via INTRAVENOUS

## 2021-09-25 MED ORDER — VERAPAMIL HCL 2.5 MG/ML IV SOLN
INTRAVENOUS | Status: AC | PRN
  Administered 2021-09-25: 5 mg via INTRA_ARTERIAL

## 2021-09-25 MED ORDER — VERAPAMIL HCL 2.5 MG/ML IV SOLN
INTRAVENOUS | Status: AC | PRN
  Administered 2021-09-25 (×2): 2.5 mg via INTRA_ARTERIAL

## 2021-09-25 MED ORDER — LABETALOL HCL 5 MG/ML IV SOLN
10.0000 mg | INTRAVENOUS | Status: DC | PRN
  Administered 2021-10-03: 10 mg via INTRAVENOUS
  Filled 2021-09-25: qty 4

## 2021-09-25 MED ORDER — TICAGRELOR 90 MG PO TABS
ORAL_TABLET | ORAL | Status: AC
  Filled 2021-09-25: qty 2

## 2021-09-25 MED ORDER — STROKE: EARLY STAGES OF RECOVERY BOOK
Freq: Once | Status: AC
Start: 2021-09-26 — End: 2021-09-26
  Filled 2021-09-25: qty 1

## 2021-09-25 MED ORDER — CANGRELOR TETRASODIUM 50 MG IV SOLR
INTRAVENOUS | Status: AC
  Filled 2021-09-25: qty 50

## 2021-09-25 MED ORDER — CLEVIDIPINE BUTYRATE 0.5 MG/ML IV EMUL
0.0000 mg/h | INTRAVENOUS | Status: DC
  Administered 2021-09-25: 12 mg/h via INTRAVENOUS
  Filled 2021-09-25: qty 50

## 2021-09-25 MED ORDER — FENTANYL CITRATE (PF) 100 MCG/2ML IJ SOLN
INTRAMUSCULAR | Status: AC
  Filled 2021-09-25: qty 2

## 2021-09-25 MED ORDER — CANGRELOR BOLUS VIA INFUSION
INTRAVENOUS | Status: AC | PRN
  Administered 2021-09-25: 1710 ug via INTRAVENOUS

## 2021-09-25 MED ORDER — LABETALOL HCL 5 MG/ML IV SOLN
20.0000 mg | INTRAVENOUS | Status: DC | PRN
  Administered 2021-09-25: 20 mg via INTRAVENOUS
  Filled 2021-09-25: qty 4

## 2021-09-25 NOTE — ED Triage Notes (Signed)
BIB [facility] after pt family called to report pt found down and altered. Per EMS, went to bed last night at 2000, pt was heard to be walking around at 2130. Pt family then reports hearing a loud thud, pt then found down. LKW was 2130 8/28. Pt has total right sided flaccidity and complete aphasia.

## 2021-09-25 NOTE — Progress Notes (Signed)
  Echocardiogram 2D Echocardiogram has been performed.  Erica Mcdaniel 09/25/2021, 4:57 PM

## 2021-09-25 NOTE — H&P (Addendum)
NEUROLOGY CONSULTATION NOTE   Date of service: September 25, 2021 Patient Name: Erica Mcdaniel MRN:  [id number] DOB:  03-Dec-1970 Reason for consult: "R sided weakness and aphasia" Requesting Provider: Merryl Hacker, MD _ _ _   _ __   _ __ _ _  __ __   _ __   __ _  History of Present Illness  Erica Mcdaniel is a 51 y.o. female with PMH significant for GERD, hypertension, IBS, polycystic ovarian syndrome, arthritis, morbid obesity who was last seen normal by her son at 53 on 09/24/2021.  The son reports that he did hear patient walk up in her bedroom around 2130 on 09/24/2021.  In the morning around 0350 on 09/25/2021, day heard a loud thud and went up and found the patient on the floor.  Patient was weak on her right side and not talking.  Family called EMS and she was noted to have right-sided weakness, aphasia, left gaze deviation and right hemianopsia and was brought in as a code stroke.  Patient has no prior history of strokes, no family history of strokes, no history of diabetes.  She does have history of hypertension with her blood pressure relatively well controlled per son.  Family unsure if she has hyperlipidemia.  Patient smokes every day about 2 packs and also uses marijuana.  She does not use any cocaine, heroin or other recreational substances.  No history of atrial fibrillation the family is aware of.  LKW: 20 00 on 09/16/2021. mRS: 0 tNKASE: Not offered given patient is outside the window.  I did consider doing limited diffusion images and FLAIR images with MRI given she is outside the traditional 4-1/2-hour window is for TNKase.  However, given the noted large vessel occlusion on CT angio, felt that taking her to the MRI will result in significant delay to getting her to thrombectomy.  Also given the significant clot burden, TNKase alone would not have been effective. Thrombectomy: Dr. Gerhard Perches discussed the details of the procedure, along with risks and benefits and  alternatives to thrombectomy.  He discussed about 10 to 15% risk of bleeding and patient may need a stent which would require antiplatelets and additional risk for bleeding.  However given the severely disabling nature of her current deficit with right-sided weakness and aphasia, family opted to go with thrombectomy at this time.  NIHSS components Score: Comment  1a Level of Conscious 0'[]'$  1'[x]'$  2'[]'$  3'[]'$      1b LOC Questions 0'[]'$  1'[]'$  2'[x]'$       1c LOC Commands 0'[]'$  1'[]'$  2'[x]'$       2 Best Gaze 0'[]'$  1'[]'$  2'[x]'$       3 Visual 0'[]'$  1'[]'$  2'[x]'$  3'[]'$      4 Facial Palsy 0'[]'$  1'[x]'$  2'[]'$  3'[]'$      5a Motor Arm - left 0'[x]'$  1'[]'$  2'[]'$  3'[]'$  4'[]'$  UN'[]'$    5b Motor Arm - Right 0'[]'$  1'[]'$  2'[]'$  3'[]'$  4'[x]'$  UN'[]'$    6a Motor Leg - Left 0'[x]'$  1'[]'$  2'[]'$  3'[]'$  4'[]'$  UN'[]'$    6b Motor Leg - Right 0'[]'$  1'[]'$  2'[]'$  3'[]'$  4'[x]'$  UN'[]'$    7 Limb Ataxia 0'[x]'$  1'[]'$  2'[]'$  3'[]'$  UN'[]'$     8 Sensory 0'[]'$  1'[]'$  2'[x]'$  UN'[]'$      9 Best Language 0'[]'$  1'[]'$  2'[]'$  3'[x]'$      10 Dysarthria 0'[]'$  1'[]'$  2'[x]'$  UN'[]'$      11 Extinct. and Inattention 0'[x]'$  1'[]'$  2'[]'$       TOTAL: 25        ROS   Unable to obtain review of  system secondary to aphasia.  Past History   Past Medical History:  Diagnosis Date   Anxiety    Arthritis    Cancer ([place])    skin squamous cell arm   Depression    Fibromyalgia    GERD (gastroesophageal reflux disease)    Hypertension    IBS (irritable bowel syndrome)    Pneumonia    hx   Polycystic ovarian disease    Tendonitis    Tubular adenoma of colon 2018   multiple   Past Surgical History:  Procedure Laterality Date   APPENDECTOMY     BACK SURGERY     BACK SURGERY  2010   rods put in   KNEE ARTHROSCOPY Left 13   NEUROPLASTY / TRANSPOSITION MEDIAN NERVE AT CARPAL TUNNEL BILATERAL     OVARIAN CYST SURGERY  1983   ROTATOR CUFF REPAIR  2003,04   right, and elbow,left   TENDON RECONSTRUCTION Right 12/08/2012   Procedure: ELBOW TENDON RECONSTRUCTION/Right Lateral Epicondylar Debridement with Repair.;  Surgeon: Garald Balding, MD;  Location: [facility];  Service:  Orthopedics;  Laterality: Right;  Right Lateral Epicondylar Debridement with Repair.   TRIGGER FINGER RELEASE Right 08   Family History  Problem Relation Age of Onset   Heart disease Brother    Arthritis Brother    Lung cancer Mother        meso   Arthritis Mother    Hyperlipidemia Brother    Arthritis Sister    Colon polyps Brother    Thyroid disease Father    Arthritis Father    Diabetes Maternal Grandmother    Colon cancer Neg Hx    Uterine cancer Neg Hx    Stomach cancer Neg Hx    Rectal cancer Neg Hx    Social History   Socioeconomic History   Marital status: Single    Spouse name: Not on file   Number of children: 1   Years of education: Not on file   Highest education level: Not on file  Occupational History   Occupation: disability  Tobacco Use   Smoking status: Every Day    Packs/day: 0.75    Years: 20.00    Total pack years: 15.00    Types: Cigarettes   Smokeless tobacco: Never   Tobacco comments:    taking wellbutrin to help quitting  use vqpor cig now  Vaping Use   Vaping Use: Never used  Substance and Sexual Activity   Alcohol use: No   Drug use: No   Sexual activity: Not on file  Other Topics Concern   Not on file  Social History Narrative   Not on file   Social Determinants of Health   Financial Resource Strain: Not on file  Food Insecurity: Not on file  Transportation Needs: Not on file  Physical Activity: Not on file  Stress: Not on file  Social Connections: Not on file   Allergies  Allergen Reactions   Clarithromycin Hives and Rash    Medications  (Not in a hospital admission)    Vitals   There were no vitals filed for this visit.   There is no height or weight on file to calculate BMI.  Physical Exam   General: Laying comfortably in bed; in no acute distress.  HENT: Normal oropharynx and mucosa. Normal external appearance of ears and nose.  Neck: Supple, no pain or tenderness  CV: No JVD. No peripheral edema.   Pulmonary: Symmetric Chest rise. Normal respiratory effort.  Abdomen:  Soft to touch, non-tender.  Ext: No cyanosis, edema, or deformity  Skin: No rash. Normal palpation of skin.   Musculoskeletal: Normal digits and nails by inspection. No clubbing.   Neurologic Examination  Mental status/Cognition: Alert, does not answer any orientation questions.  Speech/language: Mute, no speech.  Unable to follow commands, unable to name any objects.  Unable to repeat. Cranial nerves:   CN II Pupils equal and reactive to light, does not blink to threat on the right.   CN III,IV,VI Left gaze deviation, does not cross midline.  No nystagmus.   CN V Does not regard touch on the right face.   CN VII Right facial droop   CN VIII Turns head towards speech on the left but not on the right.   CN IX & X normal palatal elevation, no uvular deviation   CN XI    CN XII midline tongue protrusion   Motor:  Muscle bulk: Normal, tone flaccid in right upper extremity and right lower extremity.  Limited motor evaluation secondary to aphasia. Left upper extremity: She will hold it off the bed for more than 10 seconds without any drift.  Left lower extremity, she will hold off the bed for more than 5 seconds without any drift. No movement in right upper extremity and right lower extremity.  Sensation: No response to mild pinch in right upper extremity right lower extremity.  Coordination/Complex Motor:  Unable to assess. - Gait: Deferred for patient's safety.  Labs   CBC:  Recent Labs  Lab 09/25/21 0505 09/25/21 0510  WBC 14.2*  --   HGB 14.1 14.6  HCT 42.7 43.0  MCV 90.9  --   PLT 388  --     Basic Metabolic Panel:  Lab Results  Component Value Date   NA 139 09/25/2021   K 3.6 09/25/2021   CO2 28 04/15/2016   GLUCOSE 135 (H) 09/25/2021   BUN 13 09/25/2021   CREATININE 0.70 09/25/2021   CALCIUM 9.7 04/15/2016   GFRNONAA >90 12/01/2012   GFRAA >90 12/01/2012   Lipid Panel: No results found  for: "LDLCALC" HgbA1c: No results found for: "HGBA1C" Urine Drug Screen: No results found for: "LABOPIA", "COCAINSCRNUR", "LABBENZ", "AMPHETMU", "THCU", "LABBARB"  Alcohol Level No results found for: "ETH"  CT Head without contrast(Personally reviewed): Hyperdense left MCA.  Aspects 10.  No ICH.  CT angio Head and Neck with contrast(Personally reviewed): Left ICA occlusion at the origin with short segment intracranial reconstitution with subsequent left MCA M1 thrombus with occlusion.  MRI Brain(Personally reviewed): Pending  Impression   LUCETTA BAEHR is a 51 y.o. female with PMH significant for GERD, hypertension, IBS, polycystic ovarian syndrome, arthritis, morbid obesity, smoker who presents with acute left MCA stroke with right-sided weakness, aphasia, right hemianopsia, left gaze deviation.  Primary Diagnosis:  Cerebral infarction due to occlusion or stenosis of left carotid artery.   Secondary Diagnosis: Essential (primary) hypertension and Morbid Obesity(BMI > 40)  Recommendations   Acute ischemic left MCA stroke: - Frequent Neuro checks per stroke unit protocol - Recommend brain imaging with MRI Brain without contrast - Recommend obtaining TTE - Recommend obtaining Lipid panel with LDL - Please start statin if LDL > 70 - Recommend HbA1c - Antithrombotic -Per neuro IR for the first 24 hours after intervention. - Recommend DVT ppx - SBP goal -Per neuro IR for the first 24 hours after intervention. - Recommend Telemetry monitoring for arrythmia - Recommend bedside swallow screen prior to PO intake. - Stroke  education booklet - Recommend PT/OT/SLP consult - will need to counsel her on the importance of quitting smoking to reduce risk of strokes in future.  Hypertension: - We will hold home medication.  SBP goal as above.  GERD: - Protonix IV daily.  Low back pain, chronic fibromyalgia: - Hold home medication for now given somnolence.  We will resume home  tizanidine and Flexeril when she is more awake.  IBS: - hold home Bentyl for now.  This patient is critically ill and at significant risk of neurological worsening, death and care requires constant monitoring of vital signs, hemodynamics,respiratory and cardiac monitoring, neurological assessment, discussion with family, other specialists and medical decision making of high complexity. I spent 90 minutes of neurocritical care time  in the care of  this patient. This was time spent independent of any time provided by nurse practitioner or PA.  [place] Pager Number [phone] 09/25/2021  5:48 AM  ______________________________________________________________________   Thank you for the opportunity to take part in the care of this patient. If you have any further questions, please contact the neurology consultation attending.  Signed,  [place] Pager Number [phone] _ _ _   _ __   _ __ _ _  __ __   _ __   __ _

## 2021-09-25 NOTE — Procedures (Signed)
INTERVENTIONAL NEURORADIOLOGY BRIEF POSTPROCEDURE NOTE   DIAGNOSTIC CEREBRAL ANGIOGRAM    Attending: Dr. Frazier Richards   Assistant: Dr. Ladean Raya and Dr. Luanne Bras    Diagnosis: Left cervical ICA and M1/MCA occlusion   Access site: RCFA, 44F, Left CFA 6 F   Access closure: Angoioseal 8 Fr Rt and Angioseal 6 Fr left   Anesthesia: GETA   Medication used: refer to anesthesia documentation.   Complications: none.   Estimated blood loss: 70 mL   Specimen: None.   Findings: Occlusion of the left ICA, Lt M1/MCA. Mechanical thrombectomy performed with direct contact aspiration x1 with complete recanalization of the MCA(TICI 2b results,). There was occlusion of the left distal M2/MCA, superior division, thrombectomy with contact aspiration and stentriever techniques. Partially occluding thrombus in the distal A2 branches of left ACA, thrombectomy by the stentriever technique with TICI 2c results  Left carotid stenosis ~70% followed by delayed occlusion, 2 stent were placed followed by PTA was performed. No intracranial bleed in the flat panel CTs x2   The patient tolerated the procedure well without incident and is transferred to ICU intubated in a stable condition.    PLAN: - Bed rest x6 hours post femoral puncture - SBP 120-140 mmHg  -Cangrelor infusion per the protocol Aspirin 81 mg and Brilinta 90 mg

## 2021-09-25 NOTE — Progress Notes (Signed)
Responded to page to support son.  Son is in conference room waiting on doctor to return with update.  Pt is in IR the patient will be going to 4N[facility].  Chaplain provided emotional and spiritual  support to pt. Son and  Pt's father.  Jaclynn Major, [place], [facility], Pager [phone]

## 2021-09-25 NOTE — Anesthesia Postprocedure Evaluation (Signed)
Anesthesia Post Note  Patient: Erica Mcdaniel  Procedure(s) Performed: IR WITH ANESTHESIA     Patient location during evaluation: SICU Anesthesia Type: General Level of consciousness: sedated Pain management: pain level controlled Vital Signs Assessment: post-procedure vital signs reviewed and stable Respiratory status: patient remains intubated per anesthesia plan Cardiovascular status: stable Postop Assessment: no apparent nausea or vomiting Anesthetic complications: no   No notable events documented.  Last Vitals:  Vitals:   09/25/21 1515 09/25/21 1600  BP: 122/67   Pulse: 78   Resp: (!) 23   Temp:  (!) 36.1 C  SpO2: 100%     Last Pain:  Vitals:   09/25/21 1600  TempSrc: Axillary                 Tiajuana Amass

## 2021-09-25 NOTE — Progress Notes (Signed)
Patient's son, Rolla Plate, requesting notification about visitors for his mother. He would like to be called about visitors. He does not wish to set up a password at this time. It was explained to son that we will try our best to accommodate this request, however, RN may not always be available to do so. Patient's son acknowledges this. Will inform oncoming shift of this request.

## 2021-09-25 NOTE — Anesthesia Preprocedure Evaluation (Addendum)
Anesthesia Evaluation   Patient unresponsive  Preop documentation limited or incomplete due to emergent nature of procedure.  Airway Mallampati: Unable to assess       Dental   Pulmonary Current Smoker,    breath sounds clear to auscultation       Cardiovascular hypertension, Pt. on medications  Rhythm:Regular     Neuro/Psych CVA    GI/Hepatic GERD  ,  Endo/Other    Renal/GU Lab Results      Component                Value               Date                      CREATININE               0.70                09/25/2021                Musculoskeletal   Abdominal   Peds  Hematology Lab Results      Component                Value               Date                      WBC                      14.2 (H)            09/25/2021                HGB                      14.6                09/25/2021                HCT                      43.0                09/25/2021                MCV                      90.9                09/25/2021                PLT                      388                 09/25/2021              Anesthesia Other Findings   Reproductive/Obstetrics                            Anesthesia Physical Anesthesia Plan  ASA: 4 and emergent  Anesthesia Plan: General   Post-op Pain Management:    Induction: Intravenous, Rapid sequence and Cricoid pressure planned  PONV Risk Score and Plan: Ondansetron and Dexamethasone  Airway Management Planned: Oral ETT  Additional Equipment: Arterial line  Intra-op Plan:   Post-operative Plan: Possible Post-op intubation/ventilation  Informed Consent:     Only emergency history available  Plan Discussed with:   Anesthesia Plan Comments: (No report from Neurology team other than code stroke and history of RA. Rapid response nursing at stretcher side. Induction occurred once proceduralist arrived. )       Anesthesia Quick  Evaluation

## 2021-09-25 NOTE — Anesthesia Procedure Notes (Signed)
Procedure Name: Intubation Date/Time: 09/25/2021 5:47 AM  Performed by: Suzy Bouchard, CRNAPre-anesthesia Checklist: Emergency Drugs available, Patient identified, Suction available, Patient being monitored and Timeout performed Patient Re-evaluated:Patient Re-evaluated prior to induction Oxygen Delivery Method: Circle system utilized Preoxygenation: Pre-oxygenation with 100% oxygen Induction Type: IV induction and Rapid sequence Laryngoscope Size: Glidescope and 3 Grade View: Grade I Tube type: Oral Tube size: 7.5 mm Number of attempts: 1 Airway Equipment and Method: Stylet and Video-laryngoscopy Placement Confirmation: ETT inserted through vocal cords under direct vision, positive ETCO2 and breath sounds checked- equal and bilateral Secured at: 23 cm Tube secured with: Tape Dental Injury: Teeth and Oropharynx as per pre-operative assessment

## 2021-09-25 NOTE — Progress Notes (Signed)
Okay to give contrast for CT angio despite creatinine not back given the concern for stroke.   [place] Pager Number [phone]

## 2021-09-25 NOTE — Code Documentation (Addendum)
Stroke Response Nurse Documentation Code Documentation  Erica Mcdaniel is a 51 y.o. female arriving to [facility]  via [facility] on 8/29 with past medical hx of GERD, HTN, IBS, depression/anxiety. On No antithrombotic. Code stroke was activated by EMS.   Patient from home where she was LKW at 2000 and now complaining of right sided weakness and aphasia .  Stroke team at the bedside on patient arrival. Labs drawn and patient cleared for CT by Dr. Dina Rich. Patient to CT with team. NIHSS 26, see documentation for details and code stroke times. Patient with decreased LOC, disoriented, not following commands, left gaze preference , right hemianopia, right facial droop, right arm weakness, right leg weakness, right decreased sensation, Global aphasia , dysarthria , and right neglect on exam. The following imaging was completed:  CT Head and CTA. Patient is not a candidate for IV Thrombolytic due to out of window. Patient is a candidate for IR due to LVO.   Care Plan: Mechanical Thrombectomy.   Bedside handoff with IR RN Estill Bamberg.    Madelynn Done  Rapid Response RN

## 2021-09-25 NOTE — ED Notes (Signed)
Pt transported to IR 

## 2021-09-25 NOTE — Transfer of Care (Signed)
Immediate Anesthesia Transfer of Care Note  Patient: Erica Mcdaniel  Procedure(s) Performed: IR WITH ANESTHESIA  Patient Location: ICU  Anesthesia Type:General  Level of Consciousness: Patient remains intubated per anesthesia plan  Airway & Oxygen Therapy: Patient remains intubated per anesthesia plan and Patient placed on Ventilator (see vital sign flow sheet for setting)  Post-op Assessment: Report given to RN and Post -op Vital signs reviewed and stable  Post vital signs: Reviewed and stable  Last Vitals:  Vitals Value Taken Time  BP 155/76   Temp    Pulse 81 09/25/21 1056  Resp 18 09/25/21 1056  SpO2 98 % 09/25/21 1056  Vitals shown include unvalidated device data.  Last Pain: There were no vitals filed for this visit.       Complications: No notable events documented.

## 2021-09-25 NOTE — Progress Notes (Signed)
Carotid duplex has been completed.   Preliminary results in CV Proc.   Erica Mcdaniel Erica Mcdaniel 09/25/2021 1:55 PM

## 2021-09-25 NOTE — Consult Note (Signed)
NAME:  Erica Mcdaniel, MRN:  [id number], DOB:  24-Sep-1970, LOS: 0 ADMISSION DATE:  09/25/2021 CONSULTATION DATE:  09/25/2021 REFERRING MD:  Lorrin Goodell - Neuro, CHIEF COMPLAINT:  L ICA occlusion   History of Present Illness:  51 year old woman who presented to [facility] ED via EMS 8/29 as a Code Stroke. LKN 2130 8/28. Not a TNK candidate. PMHx significant for HTN, tobacco abuse (2 PPD) + THC, GERD, tubular adenoma (colon), IBS, PCOS, skin CA (SCC), fibromyalgia, depression/anxiety.  Patient initially presented to ED as a Code Stroke after being found down at home by son. LKN 2130 8/28. Family heard a loud thud around 0350 8/29 and found patient on the floor. Presented with unilateral R-sided flaccidity and global aphasia. NIHSS 26. Out of the window for TNK. CT Head with dense L MCA c/w thrombosis. CTA Head/Neck demonstrated emergent LVO at the L ICA and subsequent L MCA thrombosis. Patient was taken emergently to [facility]. Findings included L ICA and M1/MCA occlusion. Mechanical thrombectomy was performed with MCA TICI 2b recanalization; occlusion of L distal M2/MCA and partial occlusion of distal A2 branches of L ACA noted with TICI 2c recanalization. Stent x 2 to L carotid artery placed. Post-procedure CT negative for bleed. Transferred to 4N.  PCCM consulted for vent/BP management.  Pertinent Medical History:   Past Medical History:  Diagnosis Date   Anxiety    Arthritis    Cancer ([place])    skin squamous cell arm   Depression    Fibromyalgia    GERD (gastroesophageal reflux disease)    Hypertension    IBS (irritable bowel syndrome)    Pneumonia    hx   Polycystic ovarian disease    Tendonitis    Tubular adenoma of colon 2018   multiple   Significant Hospital Events: Including procedures, antibiotic start and stop dates in addition to other pertinent events   8/28 LKN 2130 8/29 Found down ~0350 by son with R-sided deficits, aphasic. Brought to [facility] ED via EMS. Code Stroke called. CT Head  with dense L MCA, c/f LVO. Not a TNK candidate. Taken to [facility] for thrombectomy/recanalization. Post-procedure CT Head negative for bleed.  Interim History / Subjective:  PCCM consulted for assistance with vent/BP management  Objective:  Blood pressure 136/68, pulse 72, resp. rate 20, SpO2 93 %.        Intake/Output Summary (Last 24 hours) at 09/25/2021 1056 Last data filed at 09/25/2021 1033 Gross per 24 hour  Intake 1000 ml  Output 1200 ml  Net -200 ml   There were no vitals filed for this visit.  Physical Examination: General: Acutely ill-appearing middle-aged woman in NAD. Intubated, sedated. HEENT: [place]/AT, anicteric sclera, PERRL, moist mucous membranes. ETT/OGT in place. Neuro: Sedated. Opens eyes to verbal stimuli, tracks provider. Withdraws to pain readily in LUE. Moving LUE/LLE spontaneously, some movement of RUE/RLE with stimulation. Not following commands. Unilateral R-sided neglect noted. +Corneal, +Cough, and +Gag  CV: RRR, no m/g/r. PULM: Breathing even and unlabored on vent (PEEP 8, FiO2 100%). Lung fields CTAB anteriorly. GI: Obese, soft, nontender, nondistended. Normoactive bowel sounds. Extremities: Trace symmetric BLE edema noted. Skin: Warm/dry, no rashes.  Resolved Hospital Problem List:    Assessment & Plan:  L ICA and M1/MCA occlusion and left A2/3 occlusion s/p thrombectomy and carotid stenting Presented as Code Stroke 8/29AM. Out of TNK window. CT Head: dense L MCA c/w thrombosis. CTA Head/Neck: emergent LVO at the L ICA, subsequent L MCA thrombosis. Patient taken emergently to [facility]. Findings  included L ICA and M1/MCA occlusion. Mechanical thrombectomy was performed with MCA TICI 2b recanalization; occlusion of L distal M2/MCA and partial occlusion of distal A2 branches of L ACA noted with TICI 2c recanalization. Stent x 2 to L carotid artery placed. Post-procedure CT Head negative for bleed. - Neuro/Stroke primary - SBP goal 120-140 per NIR - Cleviprex  titrated to goal SBP - F/u MRI/MRA Brain - Further brain imaging per Neuro - F/u Echo - S/p cangrelor load, continue ASA/Brilinta - Neuroprotective measures: HOB > 30 degrees, normoglycemia, normothermia, electrolytes WNL - PT/OT/SLP when able to participate in care  Acute hypoxemic respiratory failure, periprocedural OSA Tobacco abuse ?COPD - Continue full vent support (4-8cc/kg IBW) - Likely plan for extubation 8/29PM versus 8/30AM, currently precluded by mental status - May require CPAP PRN/QHS post-extubation - Wean FiO2 for O2 sat > 90% - Daily WUA/SBT - VAP bundle - Bronchodilators as needed, query COPD component - Pulmonary hygiene - PAD protocol for sedation: Propofol and Fentanyl for goal RASS -1 to -2 - Tobacco cessation counseling when able to participate in care  Hypertension Home regimen: HCTZ. - Continue Cleviprex as above, titrated to goal SBP - Hydralazine, Labetalol PRN  Hyperlipidemia - F/u lipid panel - Statin as indicated  GERD IBS - PPI - Hold Bentyl for now - Bowel regimen with PAD protocol  Best Practice: (right click and "Reselect all SmartList Selections" daily)   Diet/type: NPO w/ meds via tube DVT prophylaxis: SCDs GI prophylaxis: PPI Lines: N/A Foley:  N/A Code Status:  full code Last date of multidisciplinary goals of care discussion [Per Primary Team]  Labs:  CBC: Recent Labs  Lab 09/25/21 0505 09/25/21 0510  WBC 14.2*  --   NEUTROABS 6.8  --   HGB 14.1 14.6  HCT 42.7 43.0  MCV 90.9  --   PLT 388  --    Basic Metabolic Panel: Recent Labs  Lab 09/25/21 0505 09/25/21 0510  NA 140 139  K 3.2* 3.6  CL 105 102  CO2 24  --   GLUCOSE 135* 135*  BUN 10 13  CREATININE 0.90 0.70  CALCIUM 9.4  --    GFR: CrCl cannot be calculated (Unknown ideal weight.). Recent Labs  Lab 09/25/21 0505  WBC 14.2*   Liver Function Tests: Recent Labs  Lab 09/25/21 0505  AST 39  ALT 31  ALKPHOS 73  BILITOT 0.3  PROT 6.6  ALBUMIN  3.8   No results for input(s): "LIPASE", "AMYLASE" in the last 168 hours. No results for input(s): "AMMONIA" in the last 168 hours.  ABG    Component Value Date/Time   TCO2 26 09/25/2021 0510    Coagulation Profile: Recent Labs  Lab 09/25/21 0505  INR 0.9   Cardiac Enzymes: No results for input(s): "CKTOTAL", "CKMB", "CKMBINDEX", "TROPONINI" in the last 168 hours.  HbA1C: Hgb A1c MFr Bld  Date/Time Value Ref Range Status  09/25/2021 05:05 AM 5.8 (H) 4.8 - 5.6 % Final    Comment:    (NOTE) Pre diabetes:          5.7%-6.4%  Diabetes:              >6.4%  Glycemic control for   <7.0% adults with diabetes    CBG: Recent Labs  Lab 09/25/21 0502  GLUCAP 155*   Review of Systems:   Patient is encephalopathic and/or intubated. Therefore history has been obtained from chart review.   Past Medical History:  She,  has a past  medical history of Anxiety, Arthritis, Cancer ([place]), Depression, Fibromyalgia, GERD (gastroesophageal reflux disease), Hypertension, IBS (irritable bowel syndrome), Pneumonia, Polycystic ovarian disease, Tendonitis, and Tubular adenoma of colon (2018).   Surgical History:   Past Surgical History:  Procedure Laterality Date   APPENDECTOMY     BACK SURGERY     BACK SURGERY  2010   rods put in   IR INTRAVSC STENT CERV CAROTID W/O EMB-PROT MOD SED INC ANGIO  09/25/2021   KNEE ARTHROSCOPY Left 13   NEUROPLASTY / TRANSPOSITION MEDIAN NERVE AT CARPAL TUNNEL BILATERAL     OVARIAN CYST SURGERY  1983   ROTATOR CUFF REPAIR  2003,04   right, and elbow,left   TENDON RECONSTRUCTION Right 12/08/2012   Procedure: ELBOW TENDON RECONSTRUCTION/Right Lateral Epicondylar Debridement with Repair.;  Surgeon: Garald Balding, MD;  Location: [place];  Service: Orthopedics;  Laterality: Right;  Right Lateral Epicondylar Debridement with Repair.   TRIGGER FINGER RELEASE Right 08   Social History:   reports that she has been smoking cigarettes. She has a 15.00 pack-year  smoking history. She has never used smokeless tobacco. She reports that she does not drink alcohol and does not use drugs.   Family History:  Her family history includes Arthritis in her brother, father, mother, and sister; Colon polyps in her brother; Diabetes in her maternal grandmother; Heart disease in her brother; Hyperlipidemia in her brother; Lung cancer in her mother; Thyroid disease in her father. There is no history of Colon cancer, Uterine cancer, Stomach cancer, or Rectal cancer.   Allergies: Allergies  Allergen Reactions   Clarithromycin Hives and Rash    Home Medications: Prior to Admission medications   Medication Sig Start Date End Date Taking? Authorizing Provider  celecoxib (CELEBREX) 100 MG capsule Take 100 mg by mouth 2 (two) times daily.      [provider]  cetirizine (ZYRTEC) 10 MG tablet Take 10 mg by mouth daily.    [provider]  cyclobenzaprine (FLEXERIL) 10 MG tablet Take 1 tablet (10 mg total) by mouth 3 (three) times daily as needed for muscle spasms. 08/08/21   Persons, Bevely Palmer, PA  dicyclomine (BENTYL) 10 MG capsule Take 10 mg by mouth 4 (four) times daily -  before meals and at bedtime.    [provider]  DULoxetine (CYMBALTA) 60 MG capsule Take 60 mg by mouth 2 (two) times daily.      [provider]  esomeprazole (NEXIUM) 40 MG capsule Take 1 capsule (40 mg total) by mouth 2 (two) times daily before a meal. 04/15/16   Pyrtle, Lajuan Lines, MD  hydrochlorothiazide (HYDRODIURIL) 25 MG tablet Take 25 mg by mouth daily.    [provider]  Multiple Vitamins-Minerals (MULTIVITAMIN WITH MINERALS) tablet Take 1 tablet by mouth daily.    [provider]  promethazine (PHENERGAN) 25 MG tablet Take 25 mg by mouth every 6 (six) hours as needed for nausea or vomiting.    [provider]  rifaximin (XIFAXAN) 550 MG TABS tablet Take 1 tablet (550 mg total) by mouth 3 (three) times daily. 04/25/16   Pyrtle, Lajuan Lines, MD   tiZANidine (ZANAFLEX) 4 MG capsule Take 4 mg by mouth at bedtime.    [provider]  traMADol (ULTRAM) 50 MG tablet Take 1 tablet (50 mg total) by mouth 3 (three) times daily as needed (1-2 tabs PO TID PRN). 11/10/17   Garald Balding, MD  Vitamin D, Ergocalciferol, (DRISDOL) 50000 UNITS CAPS Take 100,000 Units  by mouth every 21 ( twenty-one) days. Once a week     [provider]   Critical care time: 46 minutes   The patient is critically ill with multiple organ system failure and requires high complexity decision making for assessment and support, frequent evaluation and titration of therapies, advanced monitoring, review of radiographic studies and interpretation of complex data.   Critical Care Time devoted to patient care services, exclusive of separately billable procedures, described in this note is 46 minutes.  Lestine Mount, PA-C [facility] 09/25/21 10:56 AM  Please see [email or web address] for pager details.  From 7A-7P if no response, please call [phone] After hours, please call ELink [phone]

## 2021-09-25 NOTE — Progress Notes (Signed)
Orthopedic Tech Progress Note Patient Details:  Erica Mcdaniel 08/26/1970 [id number]  Ortho Devices Type of Ortho Device: Knee Immobilizer Ortho Device/Splint Location: BLE Ortho Device/Splint Interventions: Ordered, Application, Adjustment   Post Interventions Patient Tolerated: Well Instructions Provided: [facility] of [place] 09/25/2021, 12:36 PM

## 2021-09-25 NOTE — Progress Notes (Signed)
Patient to be done after shift change due to conflict with respiratory per RN.

## 2021-09-25 NOTE — Progress Notes (Addendum)
STROKE TEAM PROGRESS NOTE   INTERVAL HISTORY No family at the bedside. Patient just returned from IR and remains intubated. Awaiting MRI. Propofol, cleviprex, and cangrelor infusing. Given rocuronium in IR around 1030.  BP goal 120-140 per neuro IR.  Neurological exam is limited due to sedation and paralytic agent administration.  Blood pressure adequately controlled. Vitals:   09/25/21 0515  BP: 136/68  Pulse: 72  Resp: 20  SpO2: 93%   CBC:  Recent Labs  Lab 09/25/21 0505 09/25/21 0510  WBC 14.2*  --   NEUTROABS 6.8  --   HGB 14.1 14.6  HCT 42.7 43.0  MCV 90.9  --   PLT 388  --    Basic Metabolic Panel:  Recent Labs  Lab 09/25/21 0505 09/25/21 0510  NA 140 139  K 3.2* 3.6  CL 105 102  CO2 24  --   GLUCOSE 135* 135*  BUN 10 13  CREATININE 0.90 0.70  CALCIUM 9.4  --    Lipid Panel: No results for input(s): "CHOL", "TRIG", "HDL", "CHOLHDL", "VLDL", "LDLCALC" in the last 168 hours. HgbA1c:  Recent Labs  Lab 09/25/21 0505  HGBA1C 5.8*   Urine Drug Screen: No results for input(s): "LABOPIA", "COCAINSCRNUR", "LABBENZ", "AMPHETMU", "THCU", "LABBARB" in the last 168 hours.  Alcohol Level  Recent Labs  Lab 09/25/21 0505  ETH <10    IMAGING past 24 hours CT ANGIO HEAD NECK W WO CM W PERF (CODE STROKE)  Result Date: 09/25/2021 CLINICAL DATA:  Right-sided weakness EXAM: CT ANGIOGRAPHY HEAD AND NECK TECHNIQUE: Multidetector CT imaging of the head and neck was performed using the standard protocol during bolus administration of intravenous contrast. Multiplanar CT image reconstructions and MIPs were obtained to evaluate the vascular anatomy. Carotid stenosis measurements (when applicable) are obtained utilizing NASCET criteria, using the distal internal carotid diameter as the denominator. RADIATION DOSE REDUCTION: This exam was performed according to the departmental dose-optimization program which includes automated exposure control, adjustment of the mA and/or kV  according to patient size and/or use of iterative reconstruction technique. CONTRAST:  71m OMNIPAQUE IOHEXOL 350 MG/ML SOLN COMPARISON:  None Available. FINDINGS: CTA NECK FINDINGS Aortic arch: Normal arch.  Two vessel branching Right carotid system: Mixed density plaque mainly at the bifurcation without flow limiting stenosis or ulceration. Left carotid system: Mixed density plaque at the bifurcation. Proximal ICA occlusion. Vertebral arteries: No proximal subclavian stenosis. Suboptimal visualization of the vertebral arteries due to streak artifact and suboptimal bolus density. Suspect moderate narrowing at the right V1 segment. No beading or dissection seen. Skeleton: Ordinary cervical spine degeneration. Other neck: Subcutaneous varix in the left upper chest. Upper chest: Ground-glass opacity and interlobular septal thickening with airway cuffing in the upper lungs. Review of the MIP images confirms the above findings CTA HEAD FINDINGS Anterior circulation: Atheromatous calcification of the carotid siphons. Left ICA reconstitution beginning at the ophthalmic segment. There is subsequent left MCA occlusion with abrupt cut off. No additional major branch occlusion is seen. Negative for aneurysm Posterior circulation: Dominant left vertebral artery. No major branch occlusion, beading, or aneurysm Venous sinuses: Patent. Small volume gas at the left cavernous sinus usually from IV access. Anatomic variants: None significant Review of the MIP images confirms the above findings Expected findings based on prior head CT which are relayed by chat. IMPRESSION: 1. Emergent large vessel occlusion at the left ICA origin. Short segment of intracranial ICA reconstitution with subsequent left MCA thrombosis. 2. Atherosclerosis. Suspect moderate narrowing at the right V1 segment.  3. Apical pulmonary opacity favoring edema Electronically Signed   By: Jorje Guild M.D.   On: 09/25/2021 05:24   CT HEAD CODE STROKE WO  CONTRAST  Result Date: 09/25/2021 CLINICAL DATA:  Code stroke.  Right-sided weakness EXAM: CT HEAD WITHOUT CONTRAST TECHNIQUE: Contiguous axial images were obtained from the base of the skull through the vertex without intravenous contrast. RADIATION DOSE REDUCTION: This exam was performed according to the departmental dose-optimization program which includes automated exposure control, adjustment of the mA and/or kV according to patient size and/or use of iterative reconstruction technique. COMPARISON:  None Available. FINDINGS: Brain: No evidence of acute infarction, hemorrhage, hydrocephalus, extra-axial collection or mass lesion/mass effect. Vascular: Dense left MCA. Skull: Normal. Negative for fracture or focal lesion. Sinuses/Orbits: Negative Other: These results were communicated to Dr. Lorrin Goodell at 5:17 am on 09/25/2021, who is already aware. ASPECTS [facility] Stroke Program Early CT Score) - Ganglionic level infarction (caudate, lentiform nuclei, internal capsule, insula, M1-M3 cortex): 7 - Supraganglionic infarction (M4-M6 cortex): 3 Total score (0-10 with 10 being normal): 10 IMPRESSION: 1. Dense left MCA consistent with thrombosis in the setting. 2. ASPECTS is 10.  No acute hemorrhage. Electronically Signed   By: Jorje Guild M.D.   On: 09/25/2021 05:18    PHYSICAL EXAM  Physical Exam  Constitutional: Obese caucasian female who is intubated and sedated Cardiovascular: Normal rate and regular rhythm.  Respiratory: Mechanically ventilated, wheezing bil  Neuro: Mental Status: Patient is intubated, sedated, paralytic given post IR.  Patient is unresponsive with eyes closed Cranial Nerves: II: PERRL brisk 58m, corneals intact.  Head is midline.  Weak cough and gag Motor/Sensory: Tone is normal. Bulk is normal. No movement spontaneous movement of extremities. No withdrawal to pain     ASSESSMENT/PLAN Ms. JKILI GRACYis a 51y.o. female with history of GERD, hypertension, IBS,  polycystic ovarian syndrome, arthritis, morbid obesity presenting with right sided weakness, left gaze deviation, right hemianopsia and expressive aphasia after a fall.  CT showed dense left MCA and she was taken for emergent mechanical thrombectomy. Remained intubated post procedure.   Stroke:  Left hemispheric infarct due to proximal left carotid occlusion with embolization to left M1 occlusion and left A2/3 occlusion s/p thrombectomy and rescue proximal left internal carotid stenting Etiology: Proximal left ICA atherosclerosis with thrombosis and distal embolization Code Stroke CT head Dense left MCA consistent with thrombosis. ASPECTS 10.    CTA head & neck Emergent large vessel occlusion at the left ICA origin. Short segment of intracranial ICA reconstitution with subsequent left MCA thrombosis. Atherosclerosis. Suspect moderate narrowing at the right V1 segment. MRI  Pending MRA  Pending Carotid Doppler  Pending 2D Echo Pending LDL Pending HgbA1c 5.8 VTE prophylaxis - Lovenox    Diet   Diet NPO time specified   No antithrombotic prior to admission, now on aspirin 81 mg daily and Brilinta (ticagrelor) 90 mg bid.  Therapy recommendations:  Pending Disposition:  pending  S/p Mechanical Thrombectomy with TICI2b of Lt ICA and M1 and TICI 2c revascularization of distal M2 and left A2 Mechanical thrombectomy of the left ICA and Left M1, Superior division of left distal M2 occluded and A2 with delayed occlusion of left carotid, now stented x2 MRI and MRA brain ordered for today  Acute Respiratory Failure CTA head and neck shows apical pulmonary opacity, favoring edema Remains intubated CCM to manage vent settings and nebs COPD exacerbation? Low dose propofol and PRN fentanyl for sedation  Hypertension Home meds:  hydrochlorothiazide Unstable- cleviprex infusing PRN hydralazine and labetalol  BP goal 120-140 for 24 hours post IR Long-term BP goal  normotensive  Hyperlipidemia Home meds:  None LDL pending, goal < 70  Other Stroke Risk Factors Cigarette smoker, advised to stop smoking ETOH use, alcohol level <10, advised to drink no more than 1 drink(s) a day Obesity, There is no height or weight on file to calculate BMI., BMI >/= 30 associated with increased stroke risk, recommend weight loss, diet and exercise as appropriate  Obstructive sleep apnea, on CPAP at home  Other Active Problems Chronic lower back pain with right sided sciatica, fibromyalgia Home meds: Tramadol, zanaflex, cyclobenzaprine, celebrex IBS- on bentyl- hold for now GERD- protonix IV  Hospital day # 0  Patient seen and examined by NP/APP with MD. MD to update note as needed.   Janine Ores, DNP, FNP-BC [facility] Pager: [phone]    STROKE MD NOTE :  I have personally obtained history,examined this patient, reviewed notes, independently viewed imaging studies, participated in medical decision making and plan of care.ROS completed by me personally and pertinent positives fully documented  I have made any additions or clarifications directly to the above note. Agree with note above.  Patient presented with aphasia and right hemiplegia with unclear last known normal with a CT scan showing dense left MCA sign and was taken for emergent mechanical thrombectomy and found to have proximal left carotid occlusion with distal embolization to distal left M1 and left A2/A3 which was revascularized with mechanical thrombectomy  with aspiration device and later device involving revascularization of left M1, left superior division M2( TICI2b) left A2/A3 ( TICI2c)and proximal left ICA occlusion requiring rescue angioplasty and stenting.  This was a prolonged procedure requiring nearly 5 hours.  Patient remains intubated and sedated.  Recommend close neurological observation with strict blood pressure control with systolic blood pressure goal 120-140 for the  first 24 hours.  Check MRI scan of the brain later today and will consider extubation tomorrow if tolerated.  Continue aspirin and Brilinta for left carotid stent.  Check MRA brain and carotid ultrasound for patency of the revascularized vessels.  Check echocardiogram, lipid profile hemoglobin A1c. Long discussion with the patient's son and niece at the bedside about her neurological presentation, neuroimaging findings and mechanical thrombectomy and plan for care and further evaluation and treatment and answered questions.  Discussed with Dr. Gerhard Perches and Dr. Lynetta Mare This patient is critically ill and at significant risk of neurological worsening, death and care requires constant monitoring of vital signs, hemodynamics,respiratory and cardiac monitoring, extensive review of multiple databases, frequent neurological assessment, discussion with family, other specialists and medical decision making of high complexity.I have made any additions or clarifications directly to the above note.This critical care time does not reflect procedure time, or teaching time or supervisory time of PA/NP/Med Resident etc but could involve care discussion time.  I spent 50 minutes of neurocritical care time  in the care of  this patient.      Antony Contras, MD Medical Director [place] Pager: [phone] 09/25/2021 3:20 PM   To contact Stroke Continuity provider, please refer to [email or web address] After hours, contact General Neurology

## 2021-09-25 NOTE — Anesthesia Procedure Notes (Signed)
Arterial Line Insertion Start/End8/29/2023 5:49 AM, 09/25/2021 5:59 AM Performed by: Oleta Mouse, MD  Patient location: OOR procedure area. Preanesthetic checklist: IV checked and monitors and equipment checked Emergency situation Patient sedated Left, radial was placed Catheter size: 20 G Hand hygiene performed  and Seldinger technique used  Attempts: 2 Procedure performed without using ultrasound guided technique. Following insertion, dressing applied and Biopatch. Patient tolerated the procedure well with no immediate complications.

## 2021-09-25 NOTE — ED Provider Notes (Signed)
[facility] EMERGENCY DEPARTMENT Provider Note   CSN: [id number] Arrival date & time: 09/25/21  0459     History  No chief complaint on file.   Erica Mcdaniel is a 51 y.o. female.  HPI     This is a 51 year old female who presents as a code stroke.  Per EMS report and neurology report, patient was last seen normal around 8:30 PM.  She was heard walking around by her family an hour later.  At around 3:50 AM, her family heard a thud and found her on the floor with aphasia and right-sided deficits.  She was brought in by EMS as a code stroke.  Level 5 caveat  Home Medications Prior to Admission medications   Medication Sig Start Date End Date Taking? Authorizing Provider  celecoxib (CELEBREX) 100 MG capsule Take 100 mg by mouth 2 (two) times daily.      [provider]  cetirizine (ZYRTEC) 10 MG tablet Take 10 mg by mouth daily.    [provider]  cyclobenzaprine (FLEXERIL) 10 MG tablet Take 1 tablet (10 mg total) by mouth 3 (three) times daily as needed for muscle spasms. 08/08/21   Persons, Bevely Palmer, PA  dicyclomine (BENTYL) 10 MG capsule Take 10 mg by mouth 4 (four) times daily -  before meals and at bedtime.    [provider]  DULoxetine (CYMBALTA) 60 MG capsule Take 60 mg by mouth 2 (two) times daily.      [provider]  esomeprazole (NEXIUM) 40 MG capsule Take 1 capsule (40 mg total) by mouth 2 (two) times daily before a meal. 04/15/16   Pyrtle, Lajuan Lines, MD  hydrochlorothiazide (HYDRODIURIL) 25 MG tablet Take 25 mg by mouth daily.    [provider]  Multiple Vitamins-Minerals (MULTIVITAMIN WITH MINERALS) tablet Take 1 tablet by mouth daily.    [provider]  promethazine (PHENERGAN) 25 MG tablet Take 25 mg by mouth every 6 (six) hours as needed for nausea or vomiting.    [provider]  rifaximin (XIFAXAN) 550 MG TABS tablet Take 1 tablet (550 mg total) by mouth 3 (three) times daily.  04/25/16   Pyrtle, Lajuan Lines, MD  tiZANidine (ZANAFLEX) 4 MG capsule Take 4 mg by mouth at bedtime.    [provider]  traMADol (ULTRAM) 50 MG tablet Take 1 tablet (50 mg total) by mouth 3 (three) times daily as needed (1-2 tabs PO TID PRN). 11/10/17   Garald Balding, MD  Vitamin D, Ergocalciferol, (DRISDOL) 50000 UNITS CAPS Take 100,000 Units by mouth every 21 ( twenty-one) days. Once a week     [provider]      Allergies    Clarithromycin    Review of Systems   Review of Systems  Unable to perform ROS: Acuity of condition    Physical Exam Updated Vital Signs There were no vitals taken for this visit. Physical Exam Vitals and nursing note reviewed.  Constitutional:      Appearance: She is well-developed. She is obese. She is ill-appearing. She is not toxic-appearing.  HENT:     Head: Normocephalic and atraumatic.  Eyes:     Pupils: Pupils are equal, round, and reactive to light.  Cardiovascular:     Rate and Rhythm: Normal rate and regular rhythm.  Pulmonary:     Effort: Pulmonary effort is normal. No respiratory distress.  Abdominal:     Palpations: Abdomen is soft.  Musculoskeletal:  Cervical back: Neck supple.  Skin:    General: Skin is warm and dry.  Neurological:     Mental Status: She is alert.     Comments: Unable to assess orientation secondary to aphasia, slight right-sided facial droop noted, dense right hemiplegia  Psychiatric:        Mood and Affect: Mood normal.     ED Results / Procedures / Treatments   Labs (all labs ordered are listed, but only abnormal results are displayed) Labs Reviewed  CBC - Abnormal; Notable for the following components:      Result Value   WBC 14.2 (*)    All other components within normal limits  I-STAT CHEM 8, ED - Abnormal; Notable for the following components:   Glucose, Bld 135 (*)    Calcium, Ion 1.11 (*)    All other components within normal limits  I-STAT BETA HCG BLOOD, ED ([facility], [facility], [facility]  ONLY) - Abnormal; Notable for the following components:   I-stat hCG, quantitative 7.0 (*)    All other components within normal limits  CBG MONITORING, ED - Abnormal; Notable for the following components:   Glucose-Capillary 155 (*)    All other components within normal limits  RESP PANEL BY RT-PCR (FLU A&B, COVID) ARPGX2  PROTIME-INR  APTT  ETHANOL  DIFFERENTIAL  COMPREHENSIVE METABOLIC PANEL  RAPID URINE DRUG SCREEN, HOSP PERFORMED  URINALYSIS, ROUTINE W REFLEX MICROSCOPIC    EKG None  Radiology CT ANGIO HEAD NECK W WO CM W PERF (CODE STROKE)  Result Date: 09/25/2021 CLINICAL DATA:  Right-sided weakness EXAM: CT ANGIOGRAPHY HEAD AND NECK TECHNIQUE: Multidetector CT imaging of the head and neck was performed using the standard protocol during bolus administration of intravenous contrast. Multiplanar CT image reconstructions and MIPs were obtained to evaluate the vascular anatomy. Carotid stenosis measurements (when applicable) are obtained utilizing NASCET criteria, using the distal internal carotid diameter as the denominator. RADIATION DOSE REDUCTION: This exam was performed according to the departmental dose-optimization program which includes automated exposure control, adjustment of the mA and/or kV according to patient size and/or use of iterative reconstruction technique. CONTRAST:  25m OMNIPAQUE IOHEXOL 350 MG/ML SOLN COMPARISON:  None Available. FINDINGS: CTA NECK FINDINGS Aortic arch: Normal arch.  Two vessel branching Right carotid system: Mixed density plaque mainly at the bifurcation without flow limiting stenosis or ulceration. Left carotid system: Mixed density plaque at the bifurcation. Proximal ICA occlusion. Vertebral arteries: No proximal subclavian stenosis. Suboptimal visualization of the vertebral arteries due to streak artifact and suboptimal bolus density. Suspect moderate narrowing at the right V1 segment. No beading or dissection seen. Skeleton: Ordinary cervical  spine degeneration. Other neck: Subcutaneous varix in the left upper chest. Upper chest: Ground-glass opacity and interlobular septal thickening with airway cuffing in the upper lungs. Review of the MIP images confirms the above findings CTA HEAD FINDINGS Anterior circulation: Atheromatous calcification of the carotid siphons. Left ICA reconstitution beginning at the ophthalmic segment. There is subsequent left MCA occlusion with abrupt cut off. No additional major branch occlusion is seen. Negative for aneurysm Posterior circulation: Dominant left vertebral artery. No major branch occlusion, beading, or aneurysm Venous sinuses: Patent. Small volume gas at the left cavernous sinus usually from IV access. Anatomic variants: None significant Review of the MIP images confirms the above findings Expected findings based on prior head CT which are relayed by chat. IMPRESSION: 1. Emergent large vessel occlusion at the left ICA origin. Short segment of intracranial ICA reconstitution with subsequent left MCA  thrombosis. 2. Atherosclerosis. Suspect moderate narrowing at the right V1 segment. 3. Apical pulmonary opacity favoring edema Electronically Signed   By: Jorje Guild M.D.   On: 09/25/2021 05:24   CT HEAD CODE STROKE WO CONTRAST  Result Date: 09/25/2021 CLINICAL DATA:  Code stroke.  Right-sided weakness EXAM: CT HEAD WITHOUT CONTRAST TECHNIQUE: Contiguous axial images were obtained from the base of the skull through the vertex without intravenous contrast. RADIATION DOSE REDUCTION: This exam was performed according to the departmental dose-optimization program which includes automated exposure control, adjustment of the mA and/or kV according to patient size and/or use of iterative reconstruction technique. COMPARISON:  None Available. FINDINGS: Brain: No evidence of acute infarction, hemorrhage, hydrocephalus, extra-axial collection or mass lesion/mass effect. Vascular: Dense left MCA. Skull: Normal. Negative  for fracture or focal lesion. Sinuses/Orbits: Negative Other: These results were communicated to Dr. Lorrin Goodell at 5:17 am on 09/25/2021, who is already aware. ASPECTS [facility] Stroke Program Early CT Score) - Ganglionic level infarction (caudate, lentiform nuclei, internal capsule, insula, M1-M3 cortex): 7 - Supraganglionic infarction (M4-M6 cortex): 3 Total score (0-10 with 10 being normal): 10 IMPRESSION: 1. Dense left MCA consistent with thrombosis in the setting. 2. ASPECTS is 10.  No acute hemorrhage. Electronically Signed   By: Jorje Guild M.D.   On: 09/25/2021 05:18    Procedures Procedures    Medications Ordered in ED Medications  iohexol (OMNIPAQUE) 350 MG/ML injection 100 mL (60 mLs Intravenous Contrast Given 09/25/21 0515)    ED Course/ Medical Decision Making/ A&P                           Medical Decision Making  This patient presents to the ED for concern of code stroke, this involves an extensive number of treatment options, and is a complaint that carries with it a high risk of complications and morbidity.  I considered the following differential and admission for this acute, potentially life threatening condition.  The differential diagnosis includes thrombotic stroke, embolic stroke, hemorrhagic stroke  MDM:    This is a 51 year old female who presents with concern for stroke.  She has dense deficits on exam.  She was taken immediately to the CT scanner.  CT head is concerning for dense MCA sign.  Neurology is evaluating and has ordered CTA and perfusion.  Code IR has been initiated.  She is out of the window for thrombolytics.  Code stroke labs sent.  Initial labs are largely reassuring including metabolic panel and CBC.  Others are pending.  To be admitted to the neuro ICU.  (Labs, imaging, consults)  Labs: I Ordered, and personally interpreted labs.  The pertinent results include: CBC, BMP  Imaging Studies ordered: I ordered imaging studies including CT head, CTA,  CT perfusion I independently visualized and interpreted imaging. I agree with the radiologist interpretation  Additional history obtained from EMS, neurology.  External records from outside source obtained and reviewed including prior evaluations  Cardiac Monitoring: The patient was maintained on a cardiac monitor.  I personally viewed and interpreted the cardiac monitored which showed an underlying rhythm of: Sinus rhythm  Reevaluation: After the interventions noted above, I reevaluated the patient and found that they have :stayed the same  Social Determinants of Health: Smoker, lives independently  Disposition: Admit  Co morbidities that complicate the patient evaluation  Past Medical History:  Diagnosis Date   Anxiety    Arthritis    Cancer ([place])  skin squamous cell arm   Depression    Fibromyalgia    GERD (gastroesophageal reflux disease)    Hypertension    IBS (irritable bowel syndrome)    Pneumonia    hx   Polycystic ovarian disease    Tendonitis    Tubular adenoma of colon 2018   multiple     Medicines Meds ordered this encounter  Medications   iohexol (OMNIPAQUE) 350 MG/ML injection 100 mL    I have reviewed the patients home medicines and have made adjustments as needed  Problem List / ED Course: Problem List Items Addressed This Visit   None Visit Diagnoses     Cerebrovascular accident (CVA) due to occlusion of left middle cerebral artery ([place])    -  Primary                   Final Clinical Impression(s) / ED Diagnoses Final diagnoses:  Cerebrovascular accident (CVA) due to occlusion of left middle cerebral artery [facility])    Rx / DC Orders ED Discharge Orders     None         Dareld Mcauliffe, Barbette Hair, MD 09/25/21 [phone]

## 2021-09-26 ENCOUNTER — Telehealth (HOSPITAL_COMMUNITY): Payer: Self-pay | Admitting: Pharmacy Technician

## 2021-09-26 ENCOUNTER — Inpatient Hospital Stay (HOSPITAL_COMMUNITY): Payer: Medicare HMO

## 2021-09-26 ENCOUNTER — Other Ambulatory Visit (HOSPITAL_COMMUNITY): Payer: Self-pay

## 2021-09-26 ENCOUNTER — Encounter (HOSPITAL_COMMUNITY): Payer: Self-pay | Admitting: Interventional Radiology

## 2021-09-26 DIAGNOSIS — I63512 Cerebral infarction due to unspecified occlusion or stenosis of left middle cerebral artery: Secondary | ICD-10-CM | POA: Diagnosis not present

## 2021-09-26 DIAGNOSIS — K219 Gastro-esophageal reflux disease without esophagitis: Secondary | ICD-10-CM | POA: Diagnosis not present

## 2021-09-26 DIAGNOSIS — I639 Cerebral infarction, unspecified: Secondary | ICD-10-CM | POA: Diagnosis not present

## 2021-09-26 DIAGNOSIS — I10 Essential (primary) hypertension: Secondary | ICD-10-CM | POA: Diagnosis not present

## 2021-09-26 LAB — LIPID PANEL
Cholesterol: 237 mg/dL — ABNORMAL HIGH (ref 0–200)
HDL: 49 mg/dL (ref >40)
LDL Cholesterol: 137 mg/dL — ABNORMAL HIGH (ref 0–99)
Total CHOL/HDL Ratio: 4.8 RATIO
Triglycerides: 253 mg/dL — ABNORMAL HIGH (ref <150)
VLDL: 51 mg/dL — ABNORMAL HIGH (ref 0–40)

## 2021-09-26 LAB — TRIGLYCERIDES: Triglycerides: 254 mg/dL — ABNORMAL HIGH (ref <150)

## 2021-09-26 MED ORDER — BUPROPION HCL ER (SR) 100 MG PO TB12
100.0000 mg | ORAL_TABLET | Freq: Two times a day (BID) | ORAL | Status: DC
  Filled 2021-09-26 (×3): qty 1

## 2021-09-26 MED ORDER — SIMVASTATIN 20 MG PO TABS: 20.0000 mg | ORAL_TABLET | Freq: Every day | ORAL | Status: DC

## 2021-09-26 MED ORDER — FENOFIBRATE 160 MG PO TABS: 160.0000 mg | ORAL_TABLET | Freq: Every day | ORAL | Status: DC

## 2021-09-26 MED ORDER — EZETIMIBE 10 MG PO TABS: 10.0000 mg | ORAL_TABLET | Freq: Every day | ORAL | Status: DC

## 2021-09-26 MED ORDER — ASPIRIN 81 MG PO CHEW
81.0000 mg | CHEWABLE_TABLET | Freq: Every day | ORAL | Status: DC
  Administered 2021-09-26 – 2021-10-10 (×15): 81 mg
  Filled 2021-09-26 (×15): qty 1

## 2021-09-26 MED ORDER — ORAL CARE MOUTH RINSE: 15.0000 mL | OROMUCOSAL | Status: DC | PRN

## 2021-09-26 MED ORDER — ROSUVASTATIN CALCIUM 20 MG PO TABS
20.0000 mg | ORAL_TABLET | Freq: Every day | ORAL | Status: DC
  Administered 2021-09-27 – 2021-10-10 (×14): 20 mg
  Filled 2021-09-26 (×14): qty 1

## 2021-09-26 MED ORDER — TICAGRELOR 90 MG PO TABS
90.0000 mg | ORAL_TABLET | Freq: Two times a day (BID) | ORAL | Status: DC
  Administered 2021-09-26 – 2021-10-10 (×30): 90 mg
  Filled 2021-09-26 (×30): qty 1

## 2021-09-26 MED ORDER — ROSUVASTATIN CALCIUM 20 MG PO TABS: 20.0000 mg | ORAL_TABLET | Freq: Every day | ORAL | Status: DC

## 2021-09-26 NOTE — Progress Notes (Signed)
RT transported to and from MRI without event.

## 2021-09-26 NOTE — Progress Notes (Addendum)
STROKE TEAM PROGRESS NOTE   INTERVAL HISTORY Son is at the bedside. MRI and MRA done. Patchy infarct.  Mild falsely elevated flow in left carotid stent and carotid ultrasound Plan to extubate today.  She may need a cortrak, plan for SLP, PT, OT today as well.   Vitals:   09/26/21 0700 09/26/21 0800 09/26/21 0809 09/26/21 0815  BP: 120/72 126/65 97/63   Pulse:  82 89   Resp: 20 (!) 21 (!) 23   Temp:  98.5 F (36.9 C)    TempSrc:  Axillary    SpO2:  100% 100% 99%  Weight:      Height:       CBC:  Recent Labs  Lab 09/25/21 0505 09/25/21 0510 09/25/21 1154 09/25/21 1806  WBC 14.2*  --   --   --   NEUTROABS 6.8  --   --   --   HGB 14.1   < > 13.9 13.6  HCT 42.7   < > 41.0 40.0  MCV 90.9  --   --   --   PLT 388  --   --   --    < > = values in this interval not displayed.    Basic Metabolic Panel:  Recent Labs  Lab 09/25/21 0505 09/25/21 0510 09/25/21 1154 09/25/21 1806  NA 140 139 142 143  K 3.2* 3.6 3.4* 3.5  CL 105 102  --   --   CO2 24  --   --   --   GLUCOSE 135* 135*  --   --   BUN 10 13  --   --   CREATININE 0.90 0.70  --   --   CALCIUM 9.4  --   --   --     Lipid Panel:  Recent Labs  Lab 09/26/21 0352  CHOL 237*  TRIG 253*  254*  HDL 49  CHOLHDL 4.8  VLDL 51*  LDLCALC 137*   HgbA1c:  Recent Labs  Lab 09/25/21 0505  HGBA1C 5.8*    Urine Drug Screen:  Recent Labs  Lab 09/25/21 1130  LABOPIA NONE DETECTED  COCAINSCRNUR NONE DETECTED  LABBENZ NONE DETECTED  AMPHETMU NONE DETECTED  THCU POSITIVE*  LABBARB NONE DETECTED    Alcohol Level  Recent Labs  Lab 09/25/21 0505  ETH <10     IMAGING past 24 hours MR BRAIN WO CONTRAST  Result Date: 09/26/2021 CLINICAL DATA:  Follow-up examination for acute stroke, left ICA occlusion, status post mechanical thrombectomy. EXAM: MRI HEAD WITHOUT CONTRAST MRA HEAD WITHOUT CONTRAST TECHNIQUE: Multiplanar, multi-echo pulse sequences of the brain and surrounding structures were acquired without  intravenous contrast. Angiographic images of the Circle of Willis were acquired using MRA technique without intravenous contrast. COMPARISON:  Prior studies from 09/25/2021. FINDINGS: MRI HEAD FINDINGS Brain: Cerebral volume within normal limits. No significant cerebral white matter disease for age. Fairly sizable area of restricted diffusion involving the left frontal, parietal, and occipital lobes, consistent with acute left MCA distribution infarct. Involvement is most pronounced about the cortical gray matter, although fairly confluent involvement of the left caudate and lentiform nuclei noted as well. Additionally, patchy small volume acute right ACA and/or ACA/MCA watershed distribution infarcts seen within the contralateral right frontal lobe (series 5, image 95). Additional area of restricted diffusion involving the anterior right temporal pole noted as well (series 5, image 68). While this could reflect an additional site of ischemia, possible cortical contusion could also be considered. No associated hemorrhage or  significant regional mass effect about these infarcts. Otherwise, gray-white matter differentiation maintained. No areas of underlying chronic cortical infarction. No convincing or visible acute or chronic intracranial blood products. No mass lesion or midline shift. Ventricles normal size without hydrocephalus. No convincing extra-axial collection. Note made of a somewhat linear density overlying the right parietal convexity on coronal DWI sequence (series 7, image 47), favored to correspond with a prominent cortical vein. Pituitary gland and suprasellar region within normal limits. Vascular: Major intracranial vascular flow voids are maintained. Skull and upper cervical spine: Craniocervical junction within normal limits. Bone marrow signal intensity somewhat diffusely decreased on T1 weighted imaging, nonspecific, but most commonly related to anemia, smoking, or obesity. No focal marrow  replacing lesion. Probable small soft tissue contusion at the right frontotemporal scalp (series 5, image 86). Sinuses/Orbits: Globes orbital soft tissues within normal limits. Moderate mucosal thickening present throughout the paranasal sinuses. Small bilateral mastoid effusions noted. Patient is intubated. Other: None. MRA HEAD FINDINGS Anterior circulation: Visualized distal cervical segments of the internal carotid arteries are patent with antegrade flow. Focal flow defect at the distal cervical left ICA noted (series 1, image 48). Finding could be artifactual as no discrete stenosis is seen at this location on prior arteriogram. Possible focus of residual and/or recurrent thrombus with associated moderate approximate 50% stenosis is difficult to exclude. Petrous segments patent bilaterally. Right ICA widely patent through the siphon without stenosis or other abnormality. Asymmetric irregularity seen throughout the left carotid siphon, most pronounced on 3D time-of-flight sequence (series 1, image 86). While this finding could be atherosclerotic in nature, a degree of vasospasm could be considered given the recent intervention. No high-grade stenosis. A1 segments patent bilaterally. Normal anterior communicating artery complex. Both ACAs are widely patent to their distal aspects. M1 segments are now both patent bilaterally. No proximal MCA branch occlusion. Distal MCA branches well perfused bilaterally. Increased prominence of the distal left MCA branches as compared to the right, likely reflecting a degree of luxury reperfusion given the underlying ischemic changes and revascularization. Posterior circulation: Left vertebral artery strongly dominant and widely patent to the vertebrobasilar junction. Left PICA patent. Right vertebral artery markedly hypoplastic but patent without visible stenosis. Partially visualized right PICA patent as well. Basilar patent to its distal aspect without stenosis. Superior  cerebral arteries patent bilaterally. Both PCAs primarily supplied via the basilar. PCAs widely patent to their distal aspects without proximal stenosis. Anatomic variants: As above.  No intracranial aneurysm. IMPRESSION: MRI HEAD IMPRESSION: 1. Large evolving acute ischemic left MCA distribution infarct as above. No associated hemorrhage or significant regional mass effect. 2. Additional small volume acute ischemic nonhemorrhagic right ACA and/or ACA/MCA watershed distribution infarct. 3. Small area of restricted diffusion involving the right anterior temporal pole. While this finding may reflect an additional site of ischemia, a possible cortical contusion could also be considered given the presumed history of trauma. No associated hemorrhage. 4. Small soft tissue contusion at the right frontotemporal scalp. MRA HEAD IMPRESSION: 1. Interval revascularization of previously seen left ICA/MCA occlusion. Asymmetric prominence of distal left MCA branches, consistent with a degree of luxury perfusion status post revascularization. 2. Asymmetric irregularity about the left carotid siphon, indeterminate. While this finding could be atherosclerotic in nature, a degree of vasospasm could also be present given the recent intervention. 3. Focal flow defect at the distal cervical left ICA as above. While this finding could be artifactual in nature as no discrete stenosis is seen at this location on prior arteriogram, a  possible focus of residual and/or recurrent partially occlusive thrombus is difficult to exclude. Associated moderate approximate 50% stenosis at this level. 4. Otherwise wide patency of the major intracranial arterial vasculature. Electronically Signed   By: Jeannine Boga M.D.   On: 09/26/2021 03:12   MR ANGIO HEAD WO CONTRAST  Result Date: 09/26/2021 CLINICAL DATA:  Follow-up examination for acute stroke, left ICA occlusion, status post mechanical thrombectomy. EXAM: MRI HEAD WITHOUT CONTRAST MRA  HEAD WITHOUT CONTRAST TECHNIQUE: Multiplanar, multi-echo pulse sequences of the brain and surrounding structures were acquired without intravenous contrast. Angiographic images of the Circle of Willis were acquired using MRA technique without intravenous contrast. COMPARISON:  Prior studies from 09/25/2021. FINDINGS: MRI HEAD FINDINGS Brain: Cerebral volume within normal limits. No significant cerebral white matter disease for age. Fairly sizable area of restricted diffusion involving the left frontal, parietal, and occipital lobes, consistent with acute left MCA distribution infarct. Involvement is most pronounced about the cortical gray matter, although fairly confluent involvement of the left caudate and lentiform nuclei noted as well. Additionally, patchy small volume acute right ACA and/or ACA/MCA watershed distribution infarcts seen within the contralateral right frontal lobe (series 5, image 95). Additional area of restricted diffusion involving the anterior right temporal pole noted as well (series 5, image 68). While this could reflect an additional site of ischemia, possible cortical contusion could also be considered. No associated hemorrhage or significant regional mass effect about these infarcts. Otherwise, gray-white matter differentiation maintained. No areas of underlying chronic cortical infarction. No convincing or visible acute or chronic intracranial blood products. No mass lesion or midline shift. Ventricles normal size without hydrocephalus. No convincing extra-axial collection. Note made of a somewhat linear density overlying the right parietal convexity on coronal DWI sequence (series 7, image 47), favored to correspond with a prominent cortical vein. Pituitary gland and suprasellar region within normal limits. Vascular: Major intracranial vascular flow voids are maintained. Skull and upper cervical spine: Craniocervical junction within normal limits. Bone marrow signal intensity somewhat  diffusely decreased on T1 weighted imaging, nonspecific, but most commonly related to anemia, smoking, or obesity. No focal marrow replacing lesion. Probable small soft tissue contusion at the right frontotemporal scalp (series 5, image 86). Sinuses/Orbits: Globes orbital soft tissues within normal limits. Moderate mucosal thickening present throughout the paranasal sinuses. Small bilateral mastoid effusions noted. Patient is intubated. Other: None. MRA HEAD FINDINGS Anterior circulation: Visualized distal cervical segments of the internal carotid arteries are patent with antegrade flow. Focal flow defect at the distal cervical left ICA noted (series 1, image 48). Finding could be artifactual as no discrete stenosis is seen at this location on prior arteriogram. Possible focus of residual and/or recurrent thrombus with associated moderate approximate 50% stenosis is difficult to exclude. Petrous segments patent bilaterally. Right ICA widely patent through the siphon without stenosis or other abnormality. Asymmetric irregularity seen throughout the left carotid siphon, most pronounced on 3D time-of-flight sequence (series 1, image 86). While this finding could be atherosclerotic in nature, a degree of vasospasm could be considered given the recent intervention. No high-grade stenosis. A1 segments patent bilaterally. Normal anterior communicating artery complex. Both ACAs are widely patent to their distal aspects. M1 segments are now both patent bilaterally. No proximal MCA branch occlusion. Distal MCA branches well perfused bilaterally. Increased prominence of the distal left MCA branches as compared to the right, likely reflecting a degree of luxury reperfusion given the underlying ischemic changes and revascularization. Posterior circulation: Left vertebral artery strongly dominant and widely  patent to the vertebrobasilar junction. Left PICA patent. Right vertebral artery markedly hypoplastic but patent without  visible stenosis. Partially visualized right PICA patent as well. Basilar patent to its distal aspect without stenosis. Superior cerebral arteries patent bilaterally. Both PCAs primarily supplied via the basilar. PCAs widely patent to their distal aspects without proximal stenosis. Anatomic variants: As above.  No intracranial aneurysm. IMPRESSION: MRI HEAD IMPRESSION: 1. Large evolving acute ischemic left MCA distribution infarct as above. No associated hemorrhage or significant regional mass effect. 2. Additional small volume acute ischemic nonhemorrhagic right ACA and/or ACA/MCA watershed distribution infarct. 3. Small area of restricted diffusion involving the right anterior temporal pole. While this finding may reflect an additional site of ischemia, a possible cortical contusion could also be considered given the presumed history of trauma. No associated hemorrhage. 4. Small soft tissue contusion at the right frontotemporal scalp. MRA HEAD IMPRESSION: 1. Interval revascularization of previously seen left ICA/MCA occlusion. Asymmetric prominence of distal left MCA branches, consistent with a degree of luxury perfusion status post revascularization. 2. Asymmetric irregularity about the left carotid siphon, indeterminate. While this finding could be atherosclerotic in nature, a degree of vasospasm could also be present given the recent intervention. 3. Focal flow defect at the distal cervical left ICA as above. While this finding could be artifactual in nature as no discrete stenosis is seen at this location on prior arteriogram, a possible focus of residual and/or recurrent partially occlusive thrombus is difficult to exclude. Associated moderate approximate 50% stenosis at this level. 4. Otherwise wide patency of the major intracranial arterial vasculature. Electronically Signed   By: Jeannine Boga M.D.   On: 09/26/2021 03:12   ECHOCARDIOGRAM COMPLETE  Result Date: 09/25/2021    ECHOCARDIOGRAM REPORT    Patient Name:   BERENICE OEHLERT Date of Exam: 09/25/2021 Medical Rec #:  [id number]           Height:       68.0 in Accession #:    [id number]          Weight:       273.4 lb Date of Birth:  1970-11-09           BSA:          2.334 m Patient Age:    6 years            BP:           122/6 mmHg Patient Gender: F                   HR:           70 bpm. Exam Location:  Inpatient Procedure: 2D Echo, Cardiac Doppler and Color Doppler Indications:    Stroke  History:        Patient has no prior history of Echocardiogram examinations.                 Risk Factors:Hypertension and Current Smoker. GERD.  Sonographer:    Clayton Lefort RDCS (AE) Referring Phys: [id number] [facility]  Sonographer Comments: Patient is obese and echo performed with patient supine and on artificial respirator. Image acquisition challenging due to patient body habitus. IMPRESSIONS  1. Left ventricular ejection fraction, by estimation, is 65 to 70%. The left ventricle has normal function. The left ventricle has no regional wall motion abnormalities. There is mild left ventricular hypertrophy. Left ventricular diastolic parameters were normal.  2. Right ventricular systolic function is hyperdynamic. The right ventricular size is  normal. Tricuspid regurgitation signal is inadequate for assessing PA pressure.  3. The mitral valve is normal in structure. No evidence of mitral valve regurgitation.  4. The aortic valve was not well visualized. There is mild calcification of the aortic valve. Aortic valve regurgitation is not visualized. Aortic valve sclerosis/calcification is present, without any evidence of aortic stenosis.  5. The inferior vena cava is normal in size with greater than 50% respiratory variability, suggesting right atrial pressure of 3 mmHg. Comparison(s): No prior Echocardiogram. FINDINGS  Left Ventricle: Left ventricular ejection fraction, by estimation, is 65 to 70%. The left ventricle has normal function. The left ventricle has  no regional wall motion abnormalities. The left ventricular internal cavity size was normal in size. There is  mild left ventricular hypertrophy. Left ventricular diastolic parameters were normal. Right Ventricle: The right ventricular size is normal. No increase in right ventricular wall thickness. Right ventricular systolic function is hyperdynamic. Tricuspid regurgitation signal is inadequate for assessing PA pressure. Left Atrium: Left atrial size was normal in size. Right Atrium: Right atrial size was normal in size. Pericardium: There is no evidence of pericardial effusion. Mitral Valve: The mitral valve is normal in structure. No evidence of mitral valve regurgitation. MV peak gradient, 6.2 mmHg. The mean mitral valve gradient is 3.0 mmHg. Tricuspid Valve: The tricuspid valve is normal in structure. Tricuspid valve regurgitation is not demonstrated. No evidence of tricuspid stenosis. Aortic Valve: The aortic valve was not well visualized. There is mild calcification of the aortic valve. Aortic valve regurgitation is not visualized. Aortic valve sclerosis/calcification is present, without any evidence of aortic stenosis. Aortic valve mean gradient measures 9.0 mmHg. Aortic valve peak gradient measures 16.5 mmHg. Aortic valve area, by VTI measures 2.33 cm. Pulmonic Valve: The pulmonic valve was normal in structure. Pulmonic valve regurgitation is not visualized. No evidence of pulmonic stenosis. Aorta: The aortic root, ascending aorta and aortic arch are all structurally normal, with no evidence of dilitation or obstruction. Venous: The inferior vena cava is normal in size with greater than 50% respiratory variability, suggesting right atrial pressure of 3 mmHg. IAS/Shunts: No atrial level shunt detected by color flow Doppler.  LEFT VENTRICLE PLAX 2D LVIDd:         4.50 cm   Diastology LVIDs:         3.00 cm   LV e' medial:    9.90 cm/s LV PW:         1.20 cm   LV E/e' medial:  11.8 LV IVS:        0.80 cm   LV  e' lateral:   9.90 cm/s LVOT diam:     2.00 cm   LV E/e' lateral: 11.8 LV SV:         88 LV SV Index:   38 LVOT Area:     3.14 cm  RIGHT VENTRICLE             IVC RV Basal diam:  3.00 cm     IVC diam: 1.90 cm RV S prime:     18.10 cm/s TAPSE (M-mode): 3.0 cm LEFT ATRIUM           Index        RIGHT ATRIUM           Index LA diam:      3.00 cm 1.29 cm/m   RA Area:     11.70 cm LA Vol (A2C): 31.4 ml 13.45 ml/m  RA Volume:   25.20  ml  10.80 ml/m LA Vol (A4C): 55.2 ml 23.65 ml/m  AORTIC VALVE AV Area (Vmax):    2.27 cm AV Area (Vmean):   2.11 cm AV Area (VTI):     2.33 cm AV Vmax:           203.00 cm/s AV Vmean:          140.000 cm/s AV VTI:            0.379 m AV Peak Grad:      16.5 mmHg AV Mean Grad:      9.0 mmHg LVOT Vmax:         147.00 cm/s LVOT Vmean:        94.200 cm/s LVOT VTI:          0.281 m LVOT/AV VTI ratio: 0.74  AORTA Ao Root diam: 2.90 cm Ao Asc diam:  3.00 cm MITRAL VALVE MV Area (PHT): 2.91 cm     SHUNTS MV Area VTI:   2.51 cm     Systemic VTI:  0.28 m MV Peak grad:  6.2 mmHg     Systemic Diam: 2.00 cm MV Mean grad:  3.0 mmHg MV Vmax:       1.25 m/s MV Vmean:      88.6 cm/s MV Decel Time: 261 msec MV E velocity: 117.00 cm/s MV A velocity: 115.00 cm/s MV E/A ratio:  1.02 Rudean Haskell MD Electronically signed by Rudean Haskell MD Signature Date/Time: 09/25/2021/6:15:04 PM    Final    VAS US CAROTID  Result Date: 09/25/2021 Carotid Arterial Duplex Study Patient Name:  VIENNE CORCORAN  Date of Exam:   09/25/2021 Medical Rec #: [id number]            Accession #:    [id number] Date of Birth: 1970/03/13            Patient Gender: F Patient Age:   30 years Exam Location:  [facility] Procedure:      VAS US CAROTID Referring Phys: Janine Ores --------------------------------------------------------------------------------  Indications:       Left stent and Carotid stenosis. Risk Factors:      Hypertension. Other Factors:     Lt stent done 09/25/21. Limitations         Today's exam was limited due to patient on a ventilator and                    patient positioning. Comparison Study:  no prior Performing Technologist: Archie Patten RVS  Examination Guidelines: A complete evaluation includes B-mode imaging, spectral Doppler, color Doppler, and power Doppler as needed of all accessible portions of each vessel. Bilateral testing is considered an integral part of a complete examination. Limited examinations for reoccurring indications may be performed as noted.  Right Carotid Findings: +----------+--------+--------+--------+------------------+--------+           PSV cm/sEDV cm/sStenosisPlaque DescriptionComments +----------+--------+--------+--------+------------------+--------+ CCA Prox  102     26              heterogenous               +----------+--------+--------+--------+------------------+--------+ CCA Distal71      22              heterogenous               +----------+--------+--------+--------+------------------+--------+ ICA Prox  85      27      1-39%   heterogenous               +----------+--------+--------+--------+------------------+--------+  ICA Distal98      38                                         +----------+--------+--------+--------+------------------+--------+ ECA       107     13                                         +----------+--------+--------+--------+------------------+--------+ +----------+--------+-------+--------+-------------------+           PSV cm/sEDV cmsDescribeArm Pressure (mmHG) +----------+--------+-------+--------+-------------------+ [id number]                                         +----------+--------+-------+--------+-------------------+ +---------+--------+--+--------+-+ VertebralPSV cm/s35EDV cm/s7 +---------+--------+--+--------+-+  Left Carotid Findings: +----------+--------+--------+--------+------------------+--------+           PSV cm/sEDV cm/sStenosisPlaque  DescriptionComments +----------+--------+--------+--------+------------------+--------+ CCA Prox  84      17              heterogenous               +----------+--------+--------+--------+------------------+--------+ CCA Distal86      24              heterogenous               +----------+--------+--------+--------+------------------+--------+ ICA Distal174     54                                         +----------+--------+--------+--------+------------------+--------+ ECA       107     9                                          +----------+--------+--------+--------+------------------+--------+ +----------+--------+--------+--------+-------------------+           PSV cm/sEDV cm/sDescribeArm Pressure (mmHG) +----------+--------+--------+--------+-------------------+ Subclavian118                                         +----------+--------+--------+--------+-------------------+ +---------+--------+--+--------+--+---------+ VertebralPSV cm/s42EDV cm/s11Antegrade +---------+--------+--+--------+--+---------+  Left Stent(s): +---------------+---+--+---------------+++ Prox to Stent  81 21                +---------------+---+--+---------------+++ Proximal Stent 78 26                +---------------+---+--+---------------+++ Mid Stent      63 26                +---------------+---+--+---------------+++ Distal Stent   1827650-75% stenosis +---------------+---+--+---------------+++ Distal to [id number]                +---------------+---+--+---------------+++    Summary: Right Carotid: Velocities in the right ICA are consistent with a 1-39% stenosis. Left Carotid: Left distal ICA stent consistent with 50-75% stenosis. Vertebrals: Bilateral vertebral arteries demonstrate antegrade flow. *See table(s) above for measurements and observations.  Electronically signed by Antony Contras MD on 09/25/2021 at 4:36:13 PM.    Final    IR US Guide Vasc  Access Left  Result Date: 09/25/2021 INDICATION:  51 year old female with past medical history significant for GERD, hypertension, IBS, polycystic ovarian syndrome, arthritis, morbid obesity was last seen normal by her son at 2000 hours on 09/24/2021. Son reports that heard a loud third in the morning around 3:50 a.m. and went up and found the patient on the floor, she was weak in her right side and was aphasic. The patient was brought to the ER by EMS. Last known well: 2000 hours on 09/24/2021 Her NIHSS: 25. MRSA: EXAM: ULTRASOUND-GUIDED VASCULAR ACCESS DIAGNOSTIC CEREBRAL ANGIOGRAM MECHANICAL THROMBECTOMY OF THE LEFT ICA, LEFT M1/MCA, DISTAL M2/MCA OF THE SUPERIOR DIVISION, A3 SEGMENT OF LEFT ACA CAROTID STENT PLACEMENT FOR OCCLUSION OF THE LEFT CAROTID ARTERY FLAT PANEL HEAD CT X2 COMPARISON:  None Available. MEDICATIONS: Verapamil a total of 10 mg at 5 mg, and 2.5 mg increments Cangrelor 15 microgram/kg (patient weighs 114 kg) Aspirin 81 mg and Brilinta 180 mg through the orogastric tube Integrilin 3 mg ANESTHESIA/SEDATION: General anesthesia CONTRAST:  240 mL of Omnipaque 300 FLUOROSCOPY TIME:  Radiation Exposure Index (as provided by the fluoroscopic device): 4156 mGy Kerma Fluoro time: 98 minute and 12 seconds COMPLICATIONS: None immediate. TECHNIQUE: Following a full explanation of the procedure along with the potential associated complications, an informed witnessed consent was obtained. The risks of intracranial hemorrhage of 10%, worsening neurological deficit, ventilator dependency, death and inability to revascularize were all reviewed in detail with the patient's son and family. The patient was then put under general anesthesia by the Department of Anesthesiology at [facility]. The left groin was prepped and draped in the usual sterile fashion. Ultrasound examination of the left groin was performed demonstrating widely patent left common femoral artery. Images were recorded and sent to PACs.  Under ultrasound guidance and by using a micropuncture cyst, left common femoral artery was accessed 018 inch wire was advanced through the needle and the needle was exchanged for a 4 French microcatheter. Then, inner dilator was removed, 035 inch wire was introduced and 5 [place] sheath was placed. FINDINGS: Left common femoral artery is widely patent. Patent common femoral artery was recorded and sent to the PACS. PROCEDURE: Ultrasound-guided access into the left common femoral artery. IMPRESSION: Ultrasound-guided access into the left common femoral artery. Please refer to the detailed report in the previous dictation. PLAN: 1. Transfer to ICU. 2. Tab. Aspirin 81 mg and Tab. Brilinta 90 mg bid 3. BP goals of 120-140 mm Hg Electronically Signed   By: Frazier Richards M.D.   On: 09/25/2021 14:34   IR PERCUTANEOUS ART THROMBECTOMY/INFUSION INTRACRANIAL INC DIAG ANGIO  Result Date: 09/25/2021 INDICATION: 51 year old female with past medical history significant for GERD, hypertension, IBS, polycystic ovarian syndrome, arthritis, morbid obesity was last seen normal by her son at 2000 hours on 09/24/2021. Son reports that heard a loud third in the morning around 3:50 a.m. and went up and found the patient on the floor, she was weak in her right side and was aphasic. The patient was brought to the ER by EMS. Last known well: 2000 hours on 09/24/2021 Her NIHSS: 25. MRSA: 0 EXAM: ULTRASOUND-GUIDED VASCULAR ACCESS DIAGNOSTIC CEREBRAL ANGIOGRAM MECHANICAL THROMBECTOMY OF THE LEFT ICA, LEFT M1/MCA, DISTAL M2/MCA OF THE SUPERIOR DIVISION, A3 SEGMENT OF LEFT ACA CAROTID STENT PLACEMENT FOR OCCLUSION OF THE LEFT CAROTID ARTERY FLAT PANEL HEAD CT X2 COMPARISON:  None Available. MEDICATIONS: Verapamil a total of 10 mg at 5 mg, and 2.5 mg increments Cangrelor 15 microgram/kg (patient weighs 114 kg) Aspirin 81 mg and  Brilinta  180 mg through the orogastric tube Integrilin 3 mg Performing surgeon: Dr. Frazier Richards Assistants: Dr. Karenann Cai and Dr. Luanne Bras ANESTHESIA/SEDATION: The procedure was performed under general anesthesia. CONTRAST:  240 mL of Omnipaque 300 milligram/mL FLUOROSCOPY: Radiation Exposure Index (as provided by the fluoroscopic device): 4156 mGy Kerma Fluoro time: 98 minute and 12 seconds COMPLICATIONS: None immediate. TECHNIQUE: Informed written consent was obtained from the patient's son after a thorough discussion of the procedural risks, benefits and alternatives. All questions were addressed. Maximal Sterile Barrier Technique was utilized including caps, mask, sterile gowns, sterile gloves, sterile drape, hand hygiene and skin antiseptic. A timeout was performed prior to the initiation of the procedure. The right groin was prepped and draped in the usual sterile fashion. Using a micropuncture kit and the modified Seldinger technique, access was gained to the right common femoral artery and an 8 [place] by 25 cm sheath was placed. Real-time ultrasound guidance was utilized for vascular access including the acquisition of a permanent ultrasound image documenting patency of the accessed vessel. Under fluoroscopy, a Zoom 88 guide catheter was navigated over a 6 [place] neuron select Berenstein catheter and a 0.035" Terumo Glidewire into the aortic arch. The catheter was placed into the left common carotid artery and biplane DSA carotid angiography was performed. Then, under roadmap guidance, the Glidewire was advanced through the segment of occlusion of the cervical ICA and was advanced into the cavernous aspect of the ICA. The neuron select catheter was advanced over the glidewire into the mid cervical ICA and the guide catheter was advanced over the neuron select catheter to place its tip at the petrous aspect of the ICA and biplane DSA cerebral angiogram was obtained. FINDINGS: 1. Normal caliber of the right common femoral artery, adequate for vascular access. 2. Occlusion of left cervical ICA at the  origin. Biplane cerebral angiogram of petrous ICA injection demonstrated occlusion of proximal left M1-MCA. There are multiple non flow-limiting filling defects seen in the A2 and A3 segments of left ACA. 3. A-comm is patent with cross filling of the contralateral ACA. PROCEDURE: Using biplane roadmap, a zoom 071 aspiration catheter was navigated over an Aristotle 24 microguidewire into the cavernous segment of the left ICA. The Aristotle 24 micro guidewire and the aspiration catheter were then advanced to the level of occlusion at the left M1 MCA and connected to an aspiration pump. Continuous aspiration was performed for 2 minutes. The guide catheter was connected to a VacLok syringe. The aspiration catheter was subsequently removed under constant aspiration. The guide catheter was aspirated for debris. Then, biplane DSA cerebral angiogram was performed by injecting contrast through the guide catheter which demonstrated complete recanalization of the inferior division of the left MCA. However, there was an occlusion of the branch of the superior division of the left MCA seen (mid-distal M2/MCA) (TICI 2b). There was also occlusion of the distal M3 branch of the superior division of the left MCA seen as well. Then, recanalization of the M2 branch of the MCA was planned. Under roadmap guidance, and using a Zoom 55 aspiration catheter as well as the Aristotle 14 microguidewire, occluded M2 branch was selected the wire was removed and the aspiration catheter was connected to an aspiration pump. Continuous aspiration was performed for 2 minutes. The guide catheter was connected to a VacLock syringe. The aspiration catheter was subsequently removed under constant aspiration. The guide catheter was aspirated for debris. Then, biplane DSA cerebral angiogram was performed by injecting contrast through  the guide catheter which demonstrated persistent occlusion of the M2 division of the left MCA. Next, zoom 55 aspiration  catheter, phenom 21 microcatheter and Aristotle 14 micro guidewire were selected and under roadmap guidance, the occluded M2 branch of the superior division was selected the microwire and catheter were advanced into the M2 segment, wire was removed, Solitaire X 3 mm x 2 cm was selected and the solitaire stent was deployed spanning the M2 segment. The microcatheter was removed and the aspiration catheter was advanced to the level of the occlusion and was connected to the aspiration pump. The device was allowed to intercalated with the clot for 3 minutes. After 3 minutes, the thrombectomy device and aspiration catheter were removed under constant aspiration. The guide catheter was aspirated for debris. Then, biplane DSA cerebral angiogram was performed by injecting contrast through the guide catheter which demonstrated recanalization of the M2 division (TICI 2c results). However, there were clots seen in the A2 and A3 segments of left ACA. Next, mechanical thrombectomy of the left A2 and A3 segments was planned. A Zoom 55, phenom 21 microcatheter and Arisotle 14 micro guidewire were selected and under roadmap guidance, microwire and microcatheter were navigated into the pericallosal branch of the left ACA. Next, microcatheter was removed. A 3 mm by 20 mm solitaire X stent was selected and was deployed spanning the A3 segment and extending into the distal A2 segment intercalated with the clot for 3 minutes. The microcatheter was removed. The zoom 55 aspiration catheter was advanced over the wire to the level of occlusion and connected to the aspiration pump. The thrombectomy device and aspiration catheter were removed under constant aspiration. The guide catheter was aspirated for debris. Then biplane DSA cerebral angiogram was performed by injecting contrast through the guide catheter which demonstrated T2C results. Next, the guide catheter was withdrawn with its distal end in the distal common carotid artery and  biplane cerebral angiogram was performed demonstrating about 70% stenoses at the proximal aspect of the ICA with mild non flow-limiting dissection. At this point in time, carotid stent placement was planned. Flat panel CT of the head was performed which demonstrated no subarachnoid or intracranial bleed. There was some contrast staining seen at the head of the left caudate nucleus. At this point in time, orogastric tube was inserted and tablet aspirin 81 mg and Brilinta 180 mg was given. Cangrelor bolus infusion was given as well as per the protocol. Subsequently, a 021 Phenom microcatheter and Aristotle 14 micro guidewire were selected and were navigated into the cavernous ICA. The microwire was exchanged for an exchange length 014 zoom wire. Next, under roadmap guidance, XACT stent [id number] was selected and was deployed in the proximal-mid cervical ICA. Another XACT [id number] stent was selected and was deployed overlapping the previous stent and extending into the distal common carotid artery. In view of residual 50-60% in-stent stenosis, a Viatrac 5 mm x 20 mm balloon was selected and angioplasty of the in-stent stenosis was performed. Next, post angioplasty carotid and cerebral angiogram was performed. There was minimal in-stent thrombus formation seen. For this, Integrilin a total of 3 mg was injected through the guide sheath. In view of mild irregularity and spasm at the left A1 segment, right carotid and cerebral angiogram was planned. Under ultrasound guidance and using a micropuncture system, the left common femoral artery was accessed and 5 [place] by 10 cm sheath was placed. Five French vertebral artery catheter was advanced over a 035 inch guidewire and selective catheterization  of the right common carotid artery and in the internal carotid artery was performed and biplane cerebral angiogram was performed. Biplane DSA angiogram demonstrated intracranial ICA, anterior and middle cerebral artery and  their branches to be widely patent. There is patent A-comm seen with excellent cross filling of the contralateral ACA. Venous aspect of the cerebral angiogram is unremarkable Next, vert catheter and the guide catheter were removed. Flat panel CT of the head was performed demonstrating no intracranial bleed. Bilateral femoral angiogram was performed of the right femoral access site was closed using an 8 French Angio-Seal and left femoral access site was closed using 6 [place] Angio-Seal. As the COVID test was not performed and also because of the large body habitus and other comorbid issues, patient was kept intubated and was transferred to the ICU in stable condition. IMPRESSION: 1. Successful mechanical thrombectomy for treatment of an occlusion of the left ICA, left M1/MCA, M2/MCA (superior division), A2 and A3/left ACA with TICI 2C results. Multiple thrombectomy passes performed to achieve this results. 2. Left carotid stents placement. 3. Right carotid and cerebral angiogram demonstrated intracranial right ICA, right anterior and middle cerebral arteries to have a normal appearance. A-comm is widely patent with excellent cross-filling of the left ACA. PLAN: 1. Transfer to ICU. 2. Tab.  Aspirin 81 mg and Tab. Brilinta 90 mg bid 3. BP goals of 120-140 mm Hg Electronically Signed   By: Frazier Richards M.D.   On: 09/25/2021 14:14   IR US Guide Vasc Access Right  Result Date: 09/25/2021 INDICATION: 51 year old female with past medical history significant for GERD, hypertension, IBS, polycystic ovarian syndrome, arthritis, morbid obesity was last seen normal by her son at 2000 hours on 09/24/2021. Son reports that heard a loud third in the morning around 3:50 a.m. and went up and found the patient on the floor, she was weak in her right side and was aphasic. The patient was brought to the ER by EMS. Last known well: 2000 hours on 09/24/2021 Her NIHSS: 25. MRSA: 0 EXAM: ULTRASOUND-GUIDED VASCULAR ACCESS DIAGNOSTIC  CEREBRAL ANGIOGRAM MECHANICAL THROMBECTOMY OF THE LEFT ICA, LEFT M1/MCA, DISTAL M2/MCA OF THE SUPERIOR DIVISION, A3 SEGMENT OF LEFT ACA CAROTID STENT PLACEMENT FOR OCCLUSION OF THE LEFT CAROTID ARTERY FLAT PANEL HEAD CT X2 COMPARISON:  None Available. MEDICATIONS: Verapamil a total of 10 mg at 5 mg, and 2.5 mg increments Cangrelor 15 microgram/kg (patient weighs 114 kg) Aspirin 81 mg and  Brilinta 180 mg through the orogastric tube Integrilin 3 mg Performing surgeon: Dr. Frazier Richards Assistants: Dr. Karenann Cai and Dr. Luanne Bras ANESTHESIA/SEDATION: The procedure was performed under general anesthesia. CONTRAST:  240 mL of Omnipaque 300 milligram/mL FLUOROSCOPY: Radiation Exposure Index (as provided by the fluoroscopic device): 4156 mGy Kerma Fluoro time: 98 minute and 12 seconds COMPLICATIONS: None immediate. TECHNIQUE: Informed written consent was obtained from the patient's son after a thorough discussion of the procedural risks, benefits and alternatives. All questions were addressed. Maximal Sterile Barrier Technique was utilized including caps, mask, sterile gowns, sterile gloves, sterile drape, hand hygiene and skin antiseptic. A timeout was performed prior to the initiation of the procedure. The right groin was prepped and draped in the usual sterile fashion. Using a micropuncture kit and the modified Seldinger technique, access was gained to the right common femoral artery and an 8 [place] by 25 cm sheath was placed. Real-time ultrasound guidance was utilized for vascular access including the acquisition of a permanent ultrasound image documenting patency of the accessed  vessel. Under fluoroscopy, a Zoom 88 guide catheter was navigated over a 6 [place] neuron select Berenstein catheter and a 0.035" Terumo Glidewire into the aortic arch. The catheter was placed into the left common carotid artery and biplane DSA carotid angiography was performed. Then, under roadmap guidance, the Glidewire was  advanced through the segment of occlusion of the cervical ICA and was advanced into the cavernous aspect of the ICA. The neuron select catheter was advanced over the glidewire into the mid cervical ICA and the guide catheter was advanced over the neuron select catheter to place its tip at the petrous aspect of the ICA and biplane DSA cerebral angiogram was obtained. FINDINGS: 1. Normal caliber of the right common femoral artery, adequate for vascular access. 2. Occlusion of left cervical ICA at the origin. Biplane cerebral angiogram of petrous ICA injection demonstrated occlusion of proximal left M1-MCA. There are multiple non flow-limiting filling defects seen in the A2 and A3 segments of left ACA. 3. A-comm is patent with cross filling of the contralateral ACA. PROCEDURE: Using biplane roadmap, a zoom 071 aspiration catheter was navigated over an Aristotle 24 microguidewire into the cavernous segment of the left ICA. The Aristotle 24 micro guidewire and the aspiration catheter were then advanced to the level of occlusion at the left M1 MCA and connected to an aspiration pump. Continuous aspiration was performed for 2 minutes. The guide catheter was connected to a VacLok syringe. The aspiration catheter was subsequently removed under constant aspiration. The guide catheter was aspirated for debris. Then, biplane DSA cerebral angiogram was performed by injecting contrast through the guide catheter which demonstrated complete recanalization of the inferior division of the left MCA. However, there was an occlusion of the branch of the superior division of the left MCA seen (mid-distal M2/MCA) (TICI 2b). There was also occlusion of the distal M3 branch of the superior division of the left MCA seen as well. Then, recanalization of the M2 branch of the MCA was planned. Under roadmap guidance, and using a Zoom 55 aspiration catheter as well as the Aristotle 14 microguidewire, occluded M2 branch was selected the wire was  removed and the aspiration catheter was connected to an aspiration pump. Continuous aspiration was performed for 2 minutes. The guide catheter was connected to a VacLock syringe. The aspiration catheter was subsequently removed under constant aspiration. The guide catheter was aspirated for debris. Then, biplane DSA cerebral angiogram was performed by injecting contrast through the guide catheter which demonstrated persistent occlusion of the M2 division of the left MCA. Next, zoom 55 aspiration catheter, phenom 21 microcatheter and Aristotle 14 micro guidewire were selected and under roadmap guidance, the occluded M2 branch of the superior division was selected the microwire and catheter were advanced into the M2 segment, wire was removed, Solitaire X 3 mm x 2 cm was selected and the solitaire stent was deployed spanning the M2 segment. The microcatheter was removed and the aspiration catheter was advanced to the level of the occlusion and was connected to the aspiration pump. The device was allowed to intercalated with the clot for 3 minutes. After 3 minutes, the thrombectomy device and aspiration catheter were removed under constant aspiration. The guide catheter was aspirated for debris. Then, biplane DSA cerebral angiogram was performed by injecting contrast through the guide catheter which demonstrated recanalization of the M2 division (TICI 2c results). However, there were clots seen in the A2 and A3 segments of left ACA. Next, mechanical thrombectomy of the left A2 and A3  segments was planned. A Zoom 55, phenom 21 microcatheter and Arisotle 14 micro guidewire were selected and under roadmap guidance, microwire and microcatheter were navigated into the pericallosal branch of the left ACA. Next, microcatheter was removed. A 3 mm by 20 mm solitaire X stent was selected and was deployed spanning the A3 segment and extending into the distal A2 segment intercalated with the clot for 3 minutes. The microcatheter  was removed. The zoom 55 aspiration catheter was advanced over the wire to the level of occlusion and connected to the aspiration pump. The thrombectomy device and aspiration catheter were removed under constant aspiration. The guide catheter was aspirated for debris. Then biplane DSA cerebral angiogram was performed by injecting contrast through the guide catheter which demonstrated T2C results. Next, the guide catheter was withdrawn with its distal end in the distal common carotid artery and biplane cerebral angiogram was performed demonstrating about 70% stenoses at the proximal aspect of the ICA with mild non flow-limiting dissection. At this point in time, carotid stent placement was planned. Flat panel CT of the head was performed which demonstrated no subarachnoid or intracranial bleed. There was some contrast staining seen at the head of the left caudate nucleus. At this point in time, orogastric tube was inserted and tablet aspirin 81 mg and Brilinta 180 mg was given. Cangrelor bolus infusion was given as well as per the protocol. Subsequently, a 021 Phenom microcatheter and Aristotle 14 micro guidewire were selected and were navigated into the cavernous ICA. The microwire was exchanged for an exchange length 014 zoom wire. Next, under roadmap guidance, XACT stent [id number] was selected and was deployed in the proximal-mid cervical ICA. Another XACT [id number] stent was selected and was deployed overlapping the previous stent and extending into the distal common carotid artery. In view of residual 50-60% in-stent stenosis, a Viatrac 5 mm x 20 mm balloon was selected and angioplasty of the in-stent stenosis was performed. Next, post angioplasty carotid and cerebral angiogram was performed. There was minimal in-stent thrombus formation seen. For this, Integrilin a total of 3 mg was injected through the guide sheath. In view of mild irregularity and spasm at the left A1 segment, right carotid and cerebral  angiogram was planned. Under ultrasound guidance and using a micropuncture system, the left common femoral artery was accessed and 5 [place] by 10 cm sheath was placed. Five French vertebral artery catheter was advanced over a 035 inch guidewire and selective catheterization of the right common carotid artery and in the internal carotid artery was performed and biplane cerebral angiogram was performed. Biplane DSA angiogram demonstrated intracranial ICA, anterior and middle cerebral artery and their branches to be widely patent. There is patent A-comm seen with excellent cross filling of the contralateral ACA. Venous aspect of the cerebral angiogram is unremarkable Next, vert catheter and the guide catheter were removed. Flat panel CT of the head was performed demonstrating no intracranial bleed. Bilateral femoral angiogram was performed of the right femoral access site was closed using an 8 French Angio-Seal and left femoral access site was closed using 6 [place] Angio-Seal. As the COVID test was not performed and also because of the large body habitus and other comorbid issues, patient was kept intubated and was transferred to the ICU in stable condition. IMPRESSION: 1. Successful mechanical thrombectomy for treatment of an occlusion of the left ICA, left M1/MCA, M2/MCA (superior division), A2 and A3/left ACA with TICI 2C results. Multiple thrombectomy passes performed to achieve this results. 2. Left carotid  stents placement. 3. Right carotid and cerebral angiogram demonstrated intracranial right ICA, right anterior and middle cerebral arteries to have a normal appearance. A-comm is widely patent with excellent cross-filling of the left ACA. PLAN: 1. Transfer to ICU. 2. Tab.  Aspirin 81 mg and Tab. Brilinta 90 mg bid 3. BP goals of 120-140 mm Hg Electronically Signed   By: Frazier Richards M.D.   On: 09/25/2021 14:14   IR CT Head Ltd  Result Date: 09/25/2021 INDICATION: 51 year old female with past medical history  significant for GERD, hypertension, IBS, polycystic ovarian syndrome, arthritis, morbid obesity was last seen normal by her son at 2000 hours on 09/24/2021. Son reports that heard a loud third in the morning around 3:50 a.m. and went up and found the patient on the floor, she was weak in her right side and was aphasic. The patient was brought to the ER by EMS. Last known well: 2000 hours on 09/24/2021 Her NIHSS: 25. MRSA: 0 EXAM: ULTRASOUND-GUIDED VASCULAR ACCESS DIAGNOSTIC CEREBRAL ANGIOGRAM MECHANICAL THROMBECTOMY OF THE LEFT ICA, LEFT M1/MCA, DISTAL M2/MCA OF THE SUPERIOR DIVISION, A3 SEGMENT OF LEFT ACA CAROTID STENT PLACEMENT FOR OCCLUSION OF THE LEFT CAROTID ARTERY FLAT PANEL HEAD CT X2 COMPARISON:  None Available. MEDICATIONS: Verapamil a total of 10 mg at 5 mg, and 2.5 mg increments Cangrelor 15 microgram/kg (patient weighs 114 kg) Aspirin 81 mg and  Brilinta 180 mg through the orogastric tube Integrilin 3 mg Performing surgeon: Dr. Frazier Richards Assistants: Dr. Karenann Cai and Dr. Luanne Bras ANESTHESIA/SEDATION: The procedure was performed under general anesthesia. CONTRAST:  240 mL of Omnipaque 300 milligram/mL FLUOROSCOPY: Radiation Exposure Index (as provided by the fluoroscopic device): 4156 mGy Kerma Fluoro time: 98 minute and 12 seconds COMPLICATIONS: None immediate. TECHNIQUE: Informed written consent was obtained from the patient's son after a thorough discussion of the procedural risks, benefits and alternatives. All questions were addressed. Maximal Sterile Barrier Technique was utilized including caps, mask, sterile gowns, sterile gloves, sterile drape, hand hygiene and skin antiseptic. A timeout was performed prior to the initiation of the procedure. The right groin was prepped and draped in the usual sterile fashion. Using a micropuncture kit and the modified Seldinger technique, access was gained to the right common femoral artery and an 8 [place] by 25 cm sheath was placed.  Real-time ultrasound guidance was utilized for vascular access including the acquisition of a permanent ultrasound image documenting patency of the accessed vessel. Under fluoroscopy, a Zoom 88 guide catheter was navigated over a 6 [place] neuron select Berenstein catheter and a 0.035" Terumo Glidewire into the aortic arch. The catheter was placed into the left common carotid artery and biplane DSA carotid angiography was performed. Then, under roadmap guidance, the Glidewire was advanced through the segment of occlusion of the cervical ICA and was advanced into the cavernous aspect of the ICA. The neuron select catheter was advanced over the glidewire into the mid cervical ICA and the guide catheter was advanced over the neuron select catheter to place its tip at the petrous aspect of the ICA and biplane DSA cerebral angiogram was obtained. FINDINGS: 1. Normal caliber of the right common femoral artery, adequate for vascular access. 2. Occlusion of left cervical ICA at the origin. Biplane cerebral angiogram of petrous ICA injection demonstrated occlusion of proximal left M1-MCA. There are multiple non flow-limiting filling defects seen in the A2 and A3 segments of left ACA. 3. A-comm is patent with cross filling of the contralateral ACA. PROCEDURE: Using biplane  roadmap, a zoom 071 aspiration catheter was navigated over an Aristotle 24 microguidewire into the cavernous segment of the left ICA. The Aristotle 24 micro guidewire and the aspiration catheter were then advanced to the level of occlusion at the left M1 MCA and connected to an aspiration pump. Continuous aspiration was performed for 2 minutes. The guide catheter was connected to a VacLok syringe. The aspiration catheter was subsequently removed under constant aspiration. The guide catheter was aspirated for debris. Then, biplane DSA cerebral angiogram was performed by injecting contrast through the guide catheter which demonstrated complete recanalization  of the inferior division of the left MCA. However, there was an occlusion of the branch of the superior division of the left MCA seen (mid-distal M2/MCA) (TICI 2b). There was also occlusion of the distal M3 branch of the superior division of the left MCA seen as well. Then, recanalization of the M2 branch of the MCA was planned. Under roadmap guidance, and using a Zoom 55 aspiration catheter as well as the Aristotle 14 microguidewire, occluded M2 branch was selected the wire was removed and the aspiration catheter was connected to an aspiration pump. Continuous aspiration was performed for 2 minutes. The guide catheter was connected to a VacLock syringe. The aspiration catheter was subsequently removed under constant aspiration. The guide catheter was aspirated for debris. Then, biplane DSA cerebral angiogram was performed by injecting contrast through the guide catheter which demonstrated persistent occlusion of the M2 division of the left MCA. Next, zoom 55 aspiration catheter, phenom 21 microcatheter and Aristotle 14 micro guidewire were selected and under roadmap guidance, the occluded M2 branch of the superior division was selected the microwire and catheter were advanced into the M2 segment, wire was removed, Solitaire X 3 mm x 2 cm was selected and the solitaire stent was deployed spanning the M2 segment. The microcatheter was removed and the aspiration catheter was advanced to the level of the occlusion and was connected to the aspiration pump. The device was allowed to intercalated with the clot for 3 minutes. After 3 minutes, the thrombectomy device and aspiration catheter were removed under constant aspiration. The guide catheter was aspirated for debris. Then, biplane DSA cerebral angiogram was performed by injecting contrast through the guide catheter which demonstrated recanalization of the M2 division (TICI 2c results). However, there were clots seen in the A2 and A3 segments of left ACA. Next,  mechanical thrombectomy of the left A2 and A3 segments was planned. A Zoom 55, phenom 21 microcatheter and Arisotle 14 micro guidewire were selected and under roadmap guidance, microwire and microcatheter were navigated into the pericallosal branch of the left ACA. Next, microcatheter was removed. A 3 mm by 20 mm solitaire X stent was selected and was deployed spanning the A3 segment and extending into the distal A2 segment intercalated with the clot for 3 minutes. The microcatheter was removed. The zoom 55 aspiration catheter was advanced over the wire to the level of occlusion and connected to the aspiration pump. The thrombectomy device and aspiration catheter were removed under constant aspiration. The guide catheter was aspirated for debris. Then biplane DSA cerebral angiogram was performed by injecting contrast through the guide catheter which demonstrated T2C results. Next, the guide catheter was withdrawn with its distal end in the distal common carotid artery and biplane cerebral angiogram was performed demonstrating about 70% stenoses at the proximal aspect of the ICA with mild non flow-limiting dissection. At this point in time, carotid stent placement was planned. Flat panel CT  of the head was performed which demonstrated no subarachnoid or intracranial bleed. There was some contrast staining seen at the head of the left caudate nucleus. At this point in time, orogastric tube was inserted and tablet aspirin 81 mg and Brilinta 180 mg was given. Cangrelor bolus infusion was given as well as per the protocol. Subsequently, a 021 Phenom microcatheter and Aristotle 14 micro guidewire were selected and were navigated into the cavernous ICA. The microwire was exchanged for an exchange length 014 zoom wire. Next, under roadmap guidance, XACT stent [id number] was selected and was deployed in the proximal-mid cervical ICA. Another XACT [id number] stent was selected and was deployed overlapping the previous stent  and extending into the distal common carotid artery. In view of residual 50-60% in-stent stenosis, a Viatrac 5 mm x 20 mm balloon was selected and angioplasty of the in-stent stenosis was performed. Next, post angioplasty carotid and cerebral angiogram was performed. There was minimal in-stent thrombus formation seen. For this, Integrilin a total of 3 mg was injected through the guide sheath. In view of mild irregularity and spasm at the left A1 segment, right carotid and cerebral angiogram was planned. Under ultrasound guidance and using a micropuncture system, the left common femoral artery was accessed and 5 [place] by 10 cm sheath was placed. Five French vertebral artery catheter was advanced over a 035 inch guidewire and selective catheterization of the right common carotid artery and in the internal carotid artery was performed and biplane cerebral angiogram was performed. Biplane DSA angiogram demonstrated intracranial ICA, anterior and middle cerebral artery and their branches to be widely patent. There is patent A-comm seen with excellent cross filling of the contralateral ACA. Venous aspect of the cerebral angiogram is unremarkable Next, vert catheter and the guide catheter were removed. Flat panel CT of the head was performed demonstrating no intracranial bleed. Bilateral femoral angiogram was performed of the right femoral access site was closed using an 8 [place] Angio-Seal and left femoral access site was closed using 6 [place] Angio-Seal. As the COVID test was not performed and also because of the large body habitus and other comorbid issues, patient was kept intubated and was transferred to the ICU in stable condition. IMPRESSION: 1. Successful mechanical thrombectomy for treatment of an occlusion of the left ICA, left M1/MCA, M2/MCA (superior division), A2 and A3/left ACA with TICI 2C results. Multiple thrombectomy passes performed to achieve this results. 2. Left carotid stents placement. 3. Right  carotid and cerebral angiogram demonstrated intracranial right ICA, right anterior and middle cerebral arteries to have a normal appearance. A-comm is widely patent with excellent cross-filling of the left ACA. PLAN: 1. Transfer to ICU. 2. Tab.  Aspirin 81 mg and Tab. Brilinta 90 mg bid 3. BP goals of 120-140 mm Hg Electronically Signed   By: Frazier Richards M.D.   On: 09/25/2021 14:14   IR CT Head Ltd  Result Date: 09/25/2021 INDICATION: 51 year old female with past medical history significant for GERD, hypertension, IBS, polycystic ovarian syndrome, arthritis, morbid obesity was last seen normal by her son at 2000 hours on 09/24/2021. Son reports that heard a loud third in the morning around 3:50 a.m. and went up and found the patient on the floor, she was weak in her right side and was aphasic. The patient was brought to the ER by EMS. Last known well: 2000 hours on 09/24/2021 Her NIHSS: 25. MRSA: 0 EXAM: ULTRASOUND-GUIDED VASCULAR ACCESS DIAGNOSTIC CEREBRAL ANGIOGRAM MECHANICAL THROMBECTOMY OF THE LEFT ICA, LEFT M1/MCA,  DISTAL M2/MCA OF THE SUPERIOR DIVISION, A3 SEGMENT OF LEFT ACA CAROTID STENT PLACEMENT FOR OCCLUSION OF THE LEFT CAROTID ARTERY FLAT PANEL HEAD CT X2 COMPARISON:  None Available. MEDICATIONS: Verapamil a total of 10 mg at 5 mg, and 2.5 mg increments Cangrelor 15 microgram/kg (patient weighs 114 kg) Aspirin 81 mg and  Brilinta 180 mg through the orogastric tube Integrilin 3 mg Performing surgeon: Dr. Frazier Richards Assistants: Dr. Karenann Cai and Dr. Luanne Bras ANESTHESIA/SEDATION: The procedure was performed under general anesthesia. CONTRAST:  240 mL of Omnipaque 300 milligram/mL FLUOROSCOPY: Radiation Exposure Index (as provided by the fluoroscopic device): 4156 mGy Kerma Fluoro time: 98 minute and 12 seconds COMPLICATIONS: None immediate. TECHNIQUE: Informed written consent was obtained from the patient's son after a thorough discussion of the procedural risks, benefits and  alternatives. All questions were addressed. Maximal Sterile Barrier Technique was utilized including caps, mask, sterile gowns, sterile gloves, sterile drape, hand hygiene and skin antiseptic. A timeout was performed prior to the initiation of the procedure. The right groin was prepped and draped in the usual sterile fashion. Using a micropuncture kit and the modified Seldinger technique, access was gained to the right common femoral artery and an 8 [place] by 25 cm sheath was placed. Real-time ultrasound guidance was utilized for vascular access including the acquisition of a permanent ultrasound image documenting patency of the accessed vessel. Under fluoroscopy, a Zoom 88 guide catheter was navigated over a 6 [place] neuron select Berenstein catheter and a 0.035" Terumo Glidewire into the aortic arch. The catheter was placed into the left common carotid artery and biplane DSA carotid angiography was performed. Then, under roadmap guidance, the Glidewire was advanced through the segment of occlusion of the cervical ICA and was advanced into the cavernous aspect of the ICA. The neuron select catheter was advanced over the glidewire into the mid cervical ICA and the guide catheter was advanced over the neuron select catheter to place its tip at the petrous aspect of the ICA and biplane DSA cerebral angiogram was obtained. FINDINGS: 1. Normal caliber of the right common femoral artery, adequate for vascular access. 2. Occlusion of left cervical ICA at the origin. Biplane cerebral angiogram of petrous ICA injection demonstrated occlusion of proximal left M1-MCA. There are multiple non flow-limiting filling defects seen in the A2 and A3 segments of left ACA. 3. A-comm is patent with cross filling of the contralateral ACA. PROCEDURE: Using biplane roadmap, a zoom 071 aspiration catheter was navigated over an Aristotle 24 microguidewire into the cavernous segment of the left ICA. The Aristotle 24 micro guidewire and the  aspiration catheter were then advanced to the level of occlusion at the left M1 MCA and connected to an aspiration pump. Continuous aspiration was performed for 2 minutes. The guide catheter was connected to a VacLok syringe. The aspiration catheter was subsequently removed under constant aspiration. The guide catheter was aspirated for debris. Then, biplane DSA cerebral angiogram was performed by injecting contrast through the guide catheter which demonstrated complete recanalization of the inferior division of the left MCA. However, there was an occlusion of the branch of the superior division of the left MCA seen (mid-distal M2/MCA) (TICI 2b). There was also occlusion of the distal M3 branch of the superior division of the left MCA seen as well. Then, recanalization of the M2 branch of the MCA was planned. Under roadmap guidance, and using a Zoom 55 aspiration catheter as well as the Aristotle 14 microguidewire, occluded M2 branch was selected  the wire was removed and the aspiration catheter was connected to an aspiration pump. Continuous aspiration was performed for 2 minutes. The guide catheter was connected to a VacLock syringe. The aspiration catheter was subsequently removed under constant aspiration. The guide catheter was aspirated for debris. Then, biplane DSA cerebral angiogram was performed by injecting contrast through the guide catheter which demonstrated persistent occlusion of the M2 division of the left MCA. Next, zoom 55 aspiration catheter, phenom 21 microcatheter and Aristotle 14 micro guidewire were selected and under roadmap guidance, the occluded M2 branch of the superior division was selected the microwire and catheter were advanced into the M2 segment, wire was removed, Solitaire X 3 mm x 2 cm was selected and the solitaire stent was deployed spanning the M2 segment. The microcatheter was removed and the aspiration catheter was advanced to the level of the occlusion and was connected to the  aspiration pump. The device was allowed to intercalated with the clot for 3 minutes. After 3 minutes, the thrombectomy device and aspiration catheter were removed under constant aspiration. The guide catheter was aspirated for debris. Then, biplane DSA cerebral angiogram was performed by injecting contrast through the guide catheter which demonstrated recanalization of the M2 division (TICI 2c results). However, there were clots seen in the A2 and A3 segments of left ACA. Next, mechanical thrombectomy of the left A2 and A3 segments was planned. A Zoom 55, phenom 21 microcatheter and Arisotle 14 micro guidewire were selected and under roadmap guidance, microwire and microcatheter were navigated into the pericallosal branch of the left ACA. Next, microcatheter was removed. A 3 mm by 20 mm solitaire X stent was selected and was deployed spanning the A3 segment and extending into the distal A2 segment intercalated with the clot for 3 minutes. The microcatheter was removed. The zoom 55 aspiration catheter was advanced over the wire to the level of occlusion and connected to the aspiration pump. The thrombectomy device and aspiration catheter were removed under constant aspiration. The guide catheter was aspirated for debris. Then biplane DSA cerebral angiogram was performed by injecting contrast through the guide catheter which demonstrated T2C results. Next, the guide catheter was withdrawn with its distal end in the distal common carotid artery and biplane cerebral angiogram was performed demonstrating about 70% stenoses at the proximal aspect of the ICA with mild non flow-limiting dissection. At this point in time, carotid stent placement was planned. Flat panel CT of the head was performed which demonstrated no subarachnoid or intracranial bleed. There was some contrast staining seen at the head of the left caudate nucleus. At this point in time, orogastric tube was inserted and tablet aspirin 81 mg and Brilinta 180  mg was given. Cangrelor bolus infusion was given as well as per the protocol. Subsequently, a 021 Phenom microcatheter and Aristotle 14 micro guidewire were selected and were navigated into the cavernous ICA. The microwire was exchanged for an exchange length 014 zoom wire. Next, under roadmap guidance, XACT stent [id number] was selected and was deployed in the proximal-mid cervical ICA. Another XACT [id number] stent was selected and was deployed overlapping the previous stent and extending into the distal common carotid artery. In view of residual 50-60% in-stent stenosis, a Viatrac 5 mm x 20 mm balloon was selected and angioplasty of the in-stent stenosis was performed. Next, post angioplasty carotid and cerebral angiogram was performed. There was minimal in-stent thrombus formation seen. For this, Integrilin a total of 3 mg was injected through the guide sheath. In  view of mild irregularity and spasm at the left A1 segment, right carotid and cerebral angiogram was planned. Under ultrasound guidance and using a micropuncture system, the left common femoral artery was accessed and 5 [place] by 10 cm sheath was placed. Five French vertebral artery catheter was advanced over a 035 inch guidewire and selective catheterization of the right common carotid artery and in the internal carotid artery was performed and biplane cerebral angiogram was performed. Biplane DSA angiogram demonstrated intracranial ICA, anterior and middle cerebral artery and their branches to be widely patent. There is patent A-comm seen with excellent cross filling of the contralateral ACA. Venous aspect of the cerebral angiogram is unremarkable Next, vert catheter and the guide catheter were removed. Flat panel CT of the head was performed demonstrating no intracranial bleed. Bilateral femoral angiogram was performed of the right femoral access site was closed using an 8 [place] Angio-Seal and left femoral access site was closed using 6 [place]  Angio-Seal. As the COVID test was not performed and also because of the large body habitus and other comorbid issues, patient was kept intubated and was transferred to the ICU in stable condition. IMPRESSION: 1. Successful mechanical thrombectomy for treatment of an occlusion of the left ICA, left M1/MCA, M2/MCA (superior division), A2 and A3/left ACA with TICI 2C results. Multiple thrombectomy passes performed to achieve this results. 2. Left carotid stents placement. 3. Right carotid and cerebral angiogram demonstrated intracranial right ICA, right anterior and middle cerebral arteries to have a normal appearance. A-comm is widely patent with excellent cross-filling of the left ACA. PLAN: 1. Transfer to ICU. 2. Tab.  Aspirin 81 mg and Tab. Brilinta 90 mg bid 3. BP goals of 120-140 mm Hg Electronically Signed   By: Frazier Richards M.D.   On: 09/25/2021 14:14   IR ANGIO INTRA EXTRACRAN SEL COM CAROTID INNOMINATE UNI R MOD SED  Result Date: 09/25/2021 INDICATION: 51 year old female with past medical history significant for GERD, hypertension, IBS, polycystic ovarian syndrome, arthritis, morbid obesity was last seen normal by her son at 2000 hours on 09/24/2021. Son reports that heard a loud third in the morning around 3:50 a.m. and went up and found the patient on the floor, she was weak in her right side and was aphasic. The patient was brought to the ER by EMS. Last known well: 2000 hours on 09/24/2021 Her NIHSS: 25. MRSA: 0 EXAM: ULTRASOUND-GUIDED VASCULAR ACCESS DIAGNOSTIC CEREBRAL ANGIOGRAM MECHANICAL THROMBECTOMY OF THE LEFT ICA, LEFT M1/MCA, DISTAL M2/MCA OF THE SUPERIOR DIVISION, A3 SEGMENT OF LEFT ACA CAROTID STENT PLACEMENT FOR OCCLUSION OF THE LEFT CAROTID ARTERY FLAT PANEL HEAD CT X2 COMPARISON:  None Available. MEDICATIONS: Verapamil a total of 10 mg at 5 mg, and 2.5 mg increments Cangrelor 15 microgram/kg (patient weighs 114 kg) Aspirin 81 mg and  Brilinta 180 mg through the orogastric tube Integrilin  3 mg Performing surgeon: Dr. Frazier Richards Assistants: Dr. Karenann Cai and Dr. Luanne Bras ANESTHESIA/SEDATION: The procedure was performed under general anesthesia. CONTRAST:  240 mL of Omnipaque 300 milligram/mL FLUOROSCOPY: Radiation Exposure Index (as provided by the fluoroscopic device): 4156 mGy Kerma Fluoro time: 98 minute and 12 seconds COMPLICATIONS: None immediate. TECHNIQUE: Informed written consent was obtained from the patient's son after a thorough discussion of the procedural risks, benefits and alternatives. All questions were addressed. Maximal Sterile Barrier Technique was utilized including caps, mask, sterile gowns, sterile gloves, sterile drape, hand hygiene and skin antiseptic. A timeout was performed prior to the initiation of  the procedure. The right groin was prepped and draped in the usual sterile fashion. Using a micropuncture kit and the modified Seldinger technique, access was gained to the right common femoral artery and an 8 [place] by 25 cm sheath was placed. Real-time ultrasound guidance was utilized for vascular access including the acquisition of a permanent ultrasound image documenting patency of the accessed vessel. Under fluoroscopy, a Zoom 88 guide catheter was navigated over a 6 [place] neuron select Berenstein catheter and a 0.035" Terumo Glidewire into the aortic arch. The catheter was placed into the left common carotid artery and biplane DSA carotid angiography was performed. Then, under roadmap guidance, the Glidewire was advanced through the segment of occlusion of the cervical ICA and was advanced into the cavernous aspect of the ICA. The neuron select catheter was advanced over the glidewire into the mid cervical ICA and the guide catheter was advanced over the neuron select catheter to place its tip at the petrous aspect of the ICA and biplane DSA cerebral angiogram was obtained. FINDINGS: 1. Normal caliber of the right common femoral artery, adequate for  vascular access. 2. Occlusion of left cervical ICA at the origin. Biplane cerebral angiogram of petrous ICA injection demonstrated occlusion of proximal left M1-MCA. There are multiple non flow-limiting filling defects seen in the A2 and A3 segments of left ACA. 3. A-comm is patent with cross filling of the contralateral ACA. PROCEDURE: Using biplane roadmap, a zoom 071 aspiration catheter was navigated over an Aristotle 24 microguidewire into the cavernous segment of the left ICA. The Aristotle 24 micro guidewire and the aspiration catheter were then advanced to the level of occlusion at the left M1 MCA and connected to an aspiration pump. Continuous aspiration was performed for 2 minutes. The guide catheter was connected to a VacLok syringe. The aspiration catheter was subsequently removed under constant aspiration. The guide catheter was aspirated for debris. Then, biplane DSA cerebral angiogram was performed by injecting contrast through the guide catheter which demonstrated complete recanalization of the inferior division of the left MCA. However, there was an occlusion of the branch of the superior division of the left MCA seen (mid-distal M2/MCA) (TICI 2b). There was also occlusion of the distal M3 branch of the superior division of the left MCA seen as well. Then, recanalization of the M2 branch of the MCA was planned. Under roadmap guidance, and using a Zoom 55 aspiration catheter as well as the Aristotle 14 microguidewire, occluded M2 branch was selected the wire was removed and the aspiration catheter was connected to an aspiration pump. Continuous aspiration was performed for 2 minutes. The guide catheter was connected to a VacLock syringe. The aspiration catheter was subsequently removed under constant aspiration. The guide catheter was aspirated for debris. Then, biplane DSA cerebral angiogram was performed by injecting contrast through the guide catheter which demonstrated persistent occlusion of the  M2 division of the left MCA. Next, zoom 55 aspiration catheter, phenom 21 microcatheter and Aristotle 14 micro guidewire were selected and under roadmap guidance, the occluded M2 branch of the superior division was selected the microwire and catheter were advanced into the M2 segment, wire was removed, Solitaire X 3 mm x 2 cm was selected and the solitaire stent was deployed spanning the M2 segment. The microcatheter was removed and the aspiration catheter was advanced to the level of the occlusion and was connected to the aspiration pump. The device was allowed to intercalated with the clot for 3 minutes. After 3 minutes, the thrombectomy  device and aspiration catheter were removed under constant aspiration. The guide catheter was aspirated for debris. Then, biplane DSA cerebral angiogram was performed by injecting contrast through the guide catheter which demonstrated recanalization of the M2 division (TICI 2c results). However, there were clots seen in the A2 and A3 segments of left ACA. Next, mechanical thrombectomy of the left A2 and A3 segments was planned. A Zoom 55, phenom 21 microcatheter and Arisotle 14 micro guidewire were selected and under roadmap guidance, microwire and microcatheter were navigated into the pericallosal branch of the left ACA. Next, microcatheter was removed. A 3 mm by 20 mm solitaire X stent was selected and was deployed spanning the A3 segment and extending into the distal A2 segment intercalated with the clot for 3 minutes. The microcatheter was removed. The zoom 55 aspiration catheter was advanced over the wire to the level of occlusion and connected to the aspiration pump. The thrombectomy device and aspiration catheter were removed under constant aspiration. The guide catheter was aspirated for debris. Then biplane DSA cerebral angiogram was performed by injecting contrast through the guide catheter which demonstrated T2C results. Next, the guide catheter was withdrawn with its  distal end in the distal common carotid artery and biplane cerebral angiogram was performed demonstrating about 70% stenoses at the proximal aspect of the ICA with mild non flow-limiting dissection. At this point in time, carotid stent placement was planned. Flat panel CT of the head was performed which demonstrated no subarachnoid or intracranial bleed. There was some contrast staining seen at the head of the left caudate nucleus. At this point in time, orogastric tube was inserted and tablet aspirin 81 mg and Brilinta 180 mg was given. Cangrelor bolus infusion was given as well as per the protocol. Subsequently, a 021 Phenom microcatheter and Aristotle 14 micro guidewire were selected and were navigated into the cavernous ICA. The microwire was exchanged for an exchange length 014 zoom wire. Next, under roadmap guidance, XACT stent [id number] was selected and was deployed in the proximal-mid cervical ICA. Another XACT [id number] stent was selected and was deployed overlapping the previous stent and extending into the distal common carotid artery. In view of residual 50-60% in-stent stenosis, a Viatrac 5 mm x 20 mm balloon was selected and angioplasty of the in-stent stenosis was performed. Next, post angioplasty carotid and cerebral angiogram was performed. There was minimal in-stent thrombus formation seen. For this, Integrilin a total of 3 mg was injected through the guide sheath. In view of mild irregularity and spasm at the left A1 segment, right carotid and cerebral angiogram was planned. Under ultrasound guidance and using a micropuncture system, the left common femoral artery was accessed and 5 [place] by 10 cm sheath was placed. Five French vertebral artery catheter was advanced over a 035 inch guidewire and selective catheterization of the right common carotid artery and in the internal carotid artery was performed and biplane cerebral angiogram was performed. Biplane DSA angiogram demonstrated  intracranial ICA, anterior and middle cerebral artery and their branches to be widely patent. There is patent A-comm seen with excellent cross filling of the contralateral ACA. Venous aspect of the cerebral angiogram is unremarkable Next, vert catheter and the guide catheter were removed. Flat panel CT of the head was performed demonstrating no intracranial bleed. Bilateral femoral angiogram was performed of the right femoral access site was closed using an 8 French Angio-Seal and left femoral access site was closed using 6 [place] Angio-Seal. As the COVID test was not performed  and also because of the large body habitus and other comorbid issues, patient was kept intubated and was transferred to the ICU in stable condition. IMPRESSION: 1. Successful mechanical thrombectomy for treatment of an occlusion of the left ICA, left M1/MCA, M2/MCA (superior division), A2 and A3/left ACA with TICI 2C results. Multiple thrombectomy passes performed to achieve this results. 2. Left carotid stents placement. 3. Right carotid and cerebral angiogram demonstrated intracranial right ICA, right anterior and middle cerebral arteries to have a normal appearance. A-comm is widely patent with excellent cross-filling of the left ACA. PLAN: 1. Transfer to ICU. 2. Tab.  Aspirin 81 mg and Tab. Brilinta 90 mg bid 3. BP goals of 120-140 mm Hg Electronically Signed   By: Frazier Richards M.D.   On: 09/25/2021 14:14   DG Abd Portable 1V  Result Date: 09/25/2021 CLINICAL DATA:  Insertion of endotracheal tube and orogastric tube EXAM: PORTABLE ABDOMEN - 1 VIEW COMPARISON:  Portable exam 1106 hours without priors for comparison FINDINGS: Tip of orogastric tube projects over proximal stomach. Subsegmental atelectasis LEFT lung base. Visualized bowel gas pattern normal. No osseous abnormalities. IMPRESSION: Tip of orogastric tube projects over proximal stomach. LEFT basilar atelectasis. Electronically Signed   By: Lavonia Dana M.D.   On: 09/25/2021  11:16   DG CHEST PORT 1 VIEW  Result Date: 09/25/2021 CLINICAL DATA:  Ventilator dependence. EXAM: PORTABLE CHEST 1 VIEW COMPARISON:  12/01/2012 FINDINGS: 1105 hours. Endotracheal tube tip is 4.4 cm above the base of the carina. The NG tube passes into the stomach although the distal tip position is not included on the film. Low volume film with vascular congestion. Patchy airspace disease noted right upper lobe, left mid lung and left base. No substantial pleural effusion. Cardiopericardial silhouette is at upper limits of normal for size. Telemetry leads overlie the chest. IMPRESSION: 1. Endotracheal tube tip is 4.4 cm above the base of the carina. 2. Patchy airspace disease bilaterally compatible with asymmetric edema or multifocal pneumonia. Electronically Signed   By: Misty Stanley M.D.   On: 09/25/2021 11:16   IR INTRAVSC STENT CERV CAROTID W/O EMB-PROT MOD SED  Result Date: 09/25/2021 Glori Bickers, MD     09/25/2021 10:55 AM INTERVENTIONAL NEURORADIOLOGY BRIEF POSTPROCEDURE NOTE  DIAGNOSTIC CEREBRAL ANGIOGRAM  Attending: Dr. Frazier Richards  Assistant: Dr. Ladean Raya and Dr. Luanne Bras  Diagnosis: Left cervical ICA and M1/MCA occlusion  Access site: RCFA, 44F, Left CFA 6 F  Access closure: Angoioseal 8 Fr Rt and Angioseal 6 Fr left  Anesthesia: GETA  Medication used: refer to anesthesia documentation.  Complications: none.  Estimated blood loss: 70 mL  Specimen: None.  Findings: Occlusion of the left ICA, Lt M1/MCA. Mechanical thrombectomy performed with direct contact aspiration x1 with complete recanalization of the MCA(TICI 2b results,). There was occlusion of the left distal M2/MCA, superior division, thrombectomy with contact aspiration and stentriever techniques. Partially occluding thrombus in the distal A2 branches of left ACA, thrombectomy by the stentriever technique with TICI 2c results Left carotid stenosis ~70% followed by delayed occlusion, 2 stent were placed followed by  PTA was performed. No intracranial bleed in the flat panel CTs x2 The patient tolerated the procedure well without incident and is transferred to ICU intubated in a stable condition.  PLAN: - Bed rest x6 hours post femoral puncture - SBP 120-140 mmHg -Cangrelor infusion per the protocol Aspirin 81 mg and Brilinta 90 mg    PHYSICAL EXAM  Physical Exam  Constitutional: Obese  caucasian female who is intubated and sedated Cardiovascular: Normal rate and regular rhythm.  Respiratory: Mechanically ventilated, wheezing bil  Neuro: Mental Status: Patient is intubated, sedation off. Opening eyes, no following commands Cranial Nerves: II: PERRL brisk 61m, corneals intact. No blink to threat on the right. Left gaze preference Cough and gag intact Right facial asymmetry  Head is towards the left Motor/Sensory: Tone is normal. Bulk is normal.  Full movement of left upper and lower extremities Withdraws to pain minimally on right upper and lower but no spontaneous movement or effort against gravity    ASSESSMENT/PLAN Ms. JJULEY GIOVANETTIis a 51y.o. female with history of GERD, hypertension, IBS, polycystic ovarian syndrome, arthritis, morbid obesity presenting with right sided weakness, left gaze deviation, right hemianopsia and expressive aphasia after a fall.  CT showed dense left MCA and she was taken for emergent mechanical thrombectomy. Remained intubated post procedure. Extubated 8/30. PT/OT/ST pending  Stroke:  Left hemispheric infarct due to proximal left carotid occlusion with embolization to left M1 occlusion and left A2/3 occlusion s/p thrombectomy and rescue proximal left internal carotid stenting Etiology: Proximal left ICA atherosclerosis with thrombosis and distal embolization Code Stroke CT head Dense left MCA consistent with thrombosis. ASPECTS 10.    CTA head & neck Emergent large vessel occlusion at the left ICA origin. Short segment of intracranial ICA reconstitution with  subsequent left MCA thrombosis. Atherosclerosis. Suspect moderate narrowing at the right V1 segment. MRI Large evolving acute ischemic left MCA distribution infarct as above. Additional small volume acute ischemic nonhemorrhagic right ACA and/or ACA/MCA watershed distribution infarct. Small area of restricted diffusion involving the right anterior temporal pole. While this finding may reflect an additional site of ischemia, a possible cortical contusion could also be considered given the presumed history of trauma. No associated hemorrhage. MRA  Interval revascularization of previously seen left ICA/MCA occlusion. Focal flow defect at the distal cervical left ICA Carotid Doppler  Left distal ICA stent consistent with 50-75% stenosis. 2D Echo EF 65-70% LDL 137 HgbA1c 5.8 VTE prophylaxis - Lovenox    Diet   Diet NPO time specified   No antithrombotic prior to admission, now on aspirin 81 mg daily and Brilinta (ticagrelor) 90 mg bid.  Therapy recommendations:  Pending Disposition:  pending  S/p Mechanical Thrombectomy with TICI2b of Lt ICA and M1 and TICI 2c revascularization of distal M2 and left A2 Mechanical thrombectomy of the left ICA and Left M1, Superior division of left distal M2 occluded and A2 with delayed occlusion of left carotid, now stented x2 MRI and MRA brain ordered for today  Acute Respiratory Failure CTA head and neck shows apical pulmonary opacity, favoring edema CCM to manage vent settings and nebs COPD exacerbation? Extubated 8/30  Hypertension Home meds:  hydrochlorothiazide Unstable- cleviprex infusing PRN hydralazine and labetalol  BP goal 120-140 for 24 hours post IR Long-term BP goal normotensive  Hyperlipidemia Home meds:  None LDL 137, goal < 70 Now on Zetia 17m Crestor 2080mOther Stroke Risk Factors Cigarette smoker, advised to stop smoking ETOH use, alcohol level <10, advised to drink no more than 1 drink(s) a day Obesity, Body mass index is  41.57 kg/m., BMI >/= 30 associated with increased stroke risk, recommend weight loss, diet and exercise as appropriate  Obstructive sleep apnea, on CPAP at home  Other Active Problems Chronic lower back pain with right sided sciatica, fibromyalgia Home meds: Tramadol, zanaflex, cyclobenzaprine, celebrex IBS- on bentyl- hold for now GERD- protonix IV  Hospital day # 1  Patient seen and examined by NP/APP with MD. MD to update note as needed.   Janine Ores, DNP, FNP-BC [facility] Pager: [phone]  I have personally obtained history,examined this patient, reviewed notes, independently viewed imaging studies, participated in medical decision making and plan of care.ROS completed by me personally and pertinent positives fully documented  I have made any additions or clarifications directly to the above note. Agree with note above.  Patient remains aphasic with dense right hemiplegia.  Plan wean off ventilatory support and extubate as tolerated.  Continue close neurological observation.  Long discussion with patient's son at the bedside and answered questions.  Discussed with Dr. Lynetta Mare critical care medicine and Dr. Gerhard Perches interventional neuro radiology This patient is critically ill and at significant risk of neurological worsening, death and care requires constant monitoring of vital signs, hemodynamics,respiratory and cardiac monitoring, extensive review of multiple databases, frequent neurological assessment, discussion with family, other specialists and medical decision making of high complexity.I have made any additions or clarifications directly to the above note.This critical care time does not reflect procedure time, or teaching time or supervisory time of PA/NP/Med Resident etc but could involve care discussion time.  I spent 30 minutes of neurocritical care time  in the care of  this patient.     Antony Contras, MD Medical Director [place] Pager:  [phone] 09/26/2021 3:02 PM    To contact Stroke Continuity provider, please refer to [email or web address] After hours, contact General Neurology

## 2021-09-26 NOTE — Progress Notes (Signed)
Referring Physician(s): Donnetta Simpers  Supervising Physician: Gerhard Perches  Patient Status:  Erica Mcdaniel - In-pt  Chief Complaint:  Code stroke  Brief History:  Erica Mcdaniel is a 51 y.o. female with medical issues including GERD, hypertension, IBS, polycystic ovarian syndrome, arthritis, longstanding 2 ppd smoking history and morbid obesity.  Her son found her on the floor around 0350 on 09/25/2021. She was weak on her right side and not talking.   EMS noted her to have right-sided weakness, aphasia, left gaze deviation and right hemianopsia and was brought in as a code stroke.   CTA showed = Emergent large vessel occlusion at the left ICA origin. Short segment of intracranial ICA reconstitution with subsequent left MCA.  She underwent cerebral intervention by Dr. Gerhard Perches = Occlusion of the left ICA, Lt M1/MCA. Mechanical thrombectomy performed with direct contact aspiration x1 with complete recanalization of the MCA(TICI 2b results,). There was occlusion of the left distal M2/MCA, superior division, thrombectomy with contact aspiration and stentriever techniques. Partially occluding thrombus in the distal A2 branches of left ACA, thrombectomy by the stentriever technique with TICI 2c results   Left carotid stenosis ~70% followed by delayed occlusion, 2 stent were placed followed by PTA was performed. No intracranial bleed in the flat panel CTs x2   She remains intubated on vent. Her son and another family member are at the bedside.  Allergies: Clarithromycin  Medications: Prior to Admission medications   Medication Sig Start Date End Date Taking? Authorizing Provider  busPIRone (BUSPAR) 15 MG tablet Take 15 mg by mouth 2 (two) times daily as needed for anxiety. 08/06/21  Yes [provider]  celecoxib (CELEBREX) 200 MG capsule Take 200 mg by mouth 2 (two) times daily. 09/16/21  Yes [provider]  cetirizine (ZYRTEC) 10 MG tablet Take 10 mg by mouth daily.    Yes [provider]  Cyanocobalamin (VITAMIN B12 PO) Take 1 tablet by mouth daily.   Yes [provider]  cyclobenzaprine (FLEXERIL) 10 MG tablet Take 1 tablet (10 mg total) by mouth 3 (three) times daily as needed for muscle spasms. Patient taking differently: Take 10 mg by mouth daily as needed for muscle spasms. 08/08/21  Yes Persons, Bevely Palmer, PA  dicyclomine (BENTYL) 10 MG capsule Take 10 mg by mouth 2 (two) times daily.   Yes [provider]  DULoxetine (CYMBALTA) 60 MG capsule Take 120 mg by mouth daily.   Yes [provider]  esomeprazole (NEXIUM) 40 MG capsule Take 1 capsule (40 mg total) by mouth 2 (two) times daily before a meal. Patient taking differently: Take 40 mg by mouth daily. 04/15/16  Yes Pyrtle, Lajuan Lines, MD  ezetimibe (ZETIA) 10 MG tablet Take 10 mg by mouth daily. 05/10/21  Yes [provider]  hydrochlorothiazide (HYDRODIURIL) 25 MG tablet Take 25 mg by mouth daily.   Yes [provider]  lisinopril (ZESTRIL) 10 MG tablet Take 10 mg by mouth daily. 05/20/21  Yes [provider]  Multiple Vitamins-Minerals (MULTIVITAMIN WITH MINERALS) tablet Take 1 tablet by mouth daily.   Yes [provider]  simvastatin (ZOCOR) 10 MG tablet Take by mouth. 05/10/21  Yes [provider]  traMADol (ULTRAM) 50 MG tablet Take 1 tablet (50 mg total) by mouth 3 (three) times daily as needed (1-2 tabs PO TID PRN). 11/10/17  Yes Garald Balding, MD  Vitamin D, Ergocalciferol, (DRISDOL) 50000 UNITS CAPS Take 50,000 Units by mouth every 7 (seven) days. Once a week  Yes [provider]     Vital Signs: BP 114/61   Pulse 81   Temp 97.7 F (36.5 C) (Axillary)   Resp 19   Ht 5' 8" (1.727 m)   Wt 273 lb 5.9 oz (124 kg)   SpO2 98%   BMI 41.57 kg/m   Physical Exam Vitals reviewed.  Constitutional:      Comments: Opens eyes to voice but not following commands  Cardiovascular:     Rate and Rhythm: Normal  rate.  Pulmonary:     Breath sounds: Wheezing present.     Comments: Intubated/vent Musculoskeletal:     Comments: Bilateral CFA stick sites are ok, no bleeding/hematoma/psa  Neurological:     Comments: Opens eyes to voice Spontaneous movement left leg and arm (arms in soft restraints) Right leg movement to pain stimulation No right arm movement    Imaging: MR BRAIN WO CONTRAST  Result Date: 09/26/2021 CLINICAL DATA:  Follow-up examination for acute stroke, left ICA occlusion, status post mechanical thrombectomy. EXAM: MRI HEAD WITHOUT CONTRAST MRA HEAD WITHOUT CONTRAST TECHNIQUE: Multiplanar, multi-echo pulse sequences of the brain and surrounding structures were acquired without intravenous contrast. Angiographic images of the Circle of Willis were acquired using MRA technique without intravenous contrast. COMPARISON:  Prior studies from 09/25/2021. FINDINGS: MRI HEAD FINDINGS Brain: Cerebral volume within normal limits. No significant cerebral white matter disease for age. Fairly sizable area of restricted diffusion involving the left frontal, parietal, and occipital lobes, consistent with acute left MCA distribution infarct. Involvement is most pronounced about the cortical gray matter, although fairly confluent involvement of the left caudate and lentiform nuclei noted as well. Additionally, patchy small volume acute right ACA and/or ACA/MCA watershed distribution infarcts seen within the contralateral right frontal lobe (series 5, image 95). Additional area of restricted diffusion involving the anterior right temporal pole noted as well (series 5, image 68). While this could reflect an additional site of ischemia, possible cortical contusion could also be considered. No associated hemorrhage or significant regional mass effect about these infarcts. Otherwise, gray-white matter differentiation maintained. No areas of underlying chronic cortical infarction. No convincing or visible acute or  chronic intracranial blood products. No mass lesion or midline shift. Ventricles normal size without hydrocephalus. No convincing extra-axial collection. Note made of a somewhat linear density overlying the right parietal convexity on coronal DWI sequence (series 7, image 47), favored to correspond with a prominent cortical vein. Pituitary gland and suprasellar region within normal limits. Vascular: Major intracranial vascular flow voids are maintained. Skull and upper cervical spine: Craniocervical junction within normal limits. Bone marrow signal intensity somewhat diffusely decreased on T1 weighted imaging, nonspecific, but most commonly related to anemia, smoking, or obesity. No focal marrow replacing lesion. Probable small soft tissue contusion at the right frontotemporal scalp (series 5, image 86). Sinuses/Orbits: Globes orbital soft tissues within normal limits. Moderate mucosal thickening present throughout the paranasal sinuses. Small bilateral mastoid effusions noted. Patient is intubated. Other: None. MRA HEAD FINDINGS Anterior circulation: Visualized distal cervical segments of the internal carotid arteries are patent with antegrade flow. Focal flow defect at the distal cervical left ICA noted (series 1, image 48). Finding could be artifactual as no discrete stenosis is seen at this location on prior arteriogram. Possible focus of residual and/or recurrent thrombus with associated moderate approximate 50% stenosis is difficult to exclude. Petrous segments patent bilaterally. Right ICA widely patent through the siphon without stenosis or other abnormality. Asymmetric irregularity seen throughout the left carotid siphon, most  pronounced on 3D time-of-flight sequence (series 1, image 86). While this finding could be atherosclerotic in nature, a degree of vasospasm could be considered given the recent intervention. No high-grade stenosis. A1 segments patent bilaterally. Normal anterior communicating artery  complex. Both ACAs are widely patent to their distal aspects. M1 segments are now both patent bilaterally. No proximal MCA branch occlusion. Distal MCA branches well perfused bilaterally. Increased prominence of the distal left MCA branches as compared to the right, likely reflecting a degree of luxury reperfusion given the underlying ischemic changes and revascularization. Posterior circulation: Left vertebral artery strongly dominant and widely patent to the vertebrobasilar junction. Left PICA patent. Right vertebral artery markedly hypoplastic but patent without visible stenosis. Partially visualized right PICA patent as well. Basilar patent to its distal aspect without stenosis. Superior cerebral arteries patent bilaterally. Both PCAs primarily supplied via the basilar. PCAs widely patent to their distal aspects without proximal stenosis. Anatomic variants: As above.  No intracranial aneurysm. IMPRESSION: MRI HEAD IMPRESSION: 1. Large evolving acute ischemic left MCA distribution infarct as above. No associated hemorrhage or significant regional mass effect. 2. Additional small volume acute ischemic nonhemorrhagic right ACA and/or ACA/MCA watershed distribution infarct. 3. Small area of restricted diffusion involving the right anterior temporal pole. While this finding may reflect an additional site of ischemia, a possible cortical contusion could also be considered given the presumed history of trauma. No associated hemorrhage. 4. Small soft tissue contusion at the right frontotemporal scalp. MRA HEAD IMPRESSION: 1. Interval revascularization of previously seen left ICA/MCA occlusion. Asymmetric prominence of distal left MCA branches, consistent with a degree of luxury perfusion status post revascularization. 2. Asymmetric irregularity about the left carotid siphon, indeterminate. While this finding could be atherosclerotic in nature, a degree of vasospasm could also be present given the recent intervention. 3.  Focal flow defect at the distal cervical left ICA as above. While this finding could be artifactual in nature as no discrete stenosis is seen at this location on prior arteriogram, a possible focus of residual and/or recurrent partially occlusive thrombus is difficult to exclude. Associated moderate approximate 50% stenosis at this level. 4. Otherwise wide patency of the major intracranial arterial vasculature. Electronically Signed   By: Jeannine Boga M.D.   On: 09/26/2021 03:12   MR ANGIO HEAD WO CONTRAST  Result Date: 09/26/2021 CLINICAL DATA:  Follow-up examination for acute stroke, left ICA occlusion, status post mechanical thrombectomy. EXAM: MRI HEAD WITHOUT CONTRAST MRA HEAD WITHOUT CONTRAST TECHNIQUE: Multiplanar, multi-echo pulse sequences of the brain and surrounding structures were acquired without intravenous contrast. Angiographic images of the Circle of Willis were acquired using MRA technique without intravenous contrast. COMPARISON:  Prior studies from 09/25/2021. FINDINGS: MRI HEAD FINDINGS Brain: Cerebral volume within normal limits. No significant cerebral white matter disease for age. Fairly sizable area of restricted diffusion involving the left frontal, parietal, and occipital lobes, consistent with acute left MCA distribution infarct. Involvement is most pronounced about the cortical gray matter, although fairly confluent involvement of the left caudate and lentiform nuclei noted as well. Additionally, patchy small volume acute right ACA and/or ACA/MCA watershed distribution infarcts seen within the contralateral right frontal lobe (series 5, image 95). Additional area of restricted diffusion involving the anterior right temporal pole noted as well (series 5, image 68). While this could reflect an additional site of ischemia, possible cortical contusion could also be considered. No associated hemorrhage or significant regional mass effect about these infarcts. Otherwise, gray-white  matter differentiation maintained. No areas  of underlying chronic cortical infarction. No convincing or visible acute or chronic intracranial blood products. No mass lesion or midline shift. Ventricles normal size without hydrocephalus. No convincing extra-axial collection. Note made of a somewhat linear density overlying the right parietal convexity on coronal DWI sequence (series 7, image 47), favored to correspond with a prominent cortical vein. Pituitary gland and suprasellar region within normal limits. Vascular: Major intracranial vascular flow voids are maintained. Skull and upper cervical spine: Craniocervical junction within normal limits. Bone marrow signal intensity somewhat diffusely decreased on T1 weighted imaging, nonspecific, but most commonly related to anemia, smoking, or obesity. No focal marrow replacing lesion. Probable small soft tissue contusion at the right frontotemporal scalp (series 5, image 86). Sinuses/Orbits: Globes orbital soft tissues within normal limits. Moderate mucosal thickening present throughout the paranasal sinuses. Small bilateral mastoid effusions noted. Patient is intubated. Other: None. MRA HEAD FINDINGS Anterior circulation: Visualized distal cervical segments of the internal carotid arteries are patent with antegrade flow. Focal flow defect at the distal cervical left ICA noted (series 1, image 48). Finding could be artifactual as no discrete stenosis is seen at this location on prior arteriogram. Possible focus of residual and/or recurrent thrombus with associated moderate approximate 50% stenosis is difficult to exclude. Petrous segments patent bilaterally. Right ICA widely patent through the siphon without stenosis or other abnormality. Asymmetric irregularity seen throughout the left carotid siphon, most pronounced on 3D time-of-flight sequence (series 1, image 86). While this finding could be atherosclerotic in nature, a degree of vasospasm could be considered  given the recent intervention. No high-grade stenosis. A1 segments patent bilaterally. Normal anterior communicating artery complex. Both ACAs are widely patent to their distal aspects. M1 segments are now both patent bilaterally. No proximal MCA branch occlusion. Distal MCA branches well perfused bilaterally. Increased prominence of the distal left MCA branches as compared to the right, likely reflecting a degree of luxury reperfusion given the underlying ischemic changes and revascularization. Posterior circulation: Left vertebral artery strongly dominant and widely patent to the vertebrobasilar junction. Left PICA patent. Right vertebral artery markedly hypoplastic but patent without visible stenosis. Partially visualized right PICA patent as well. Basilar patent to its distal aspect without stenosis. Superior cerebral arteries patent bilaterally. Both PCAs primarily supplied via the basilar. PCAs widely patent to their distal aspects without proximal stenosis. Anatomic variants: As above.  No intracranial aneurysm. IMPRESSION: MRI HEAD IMPRESSION: 1. Large evolving acute ischemic left MCA distribution infarct as above. No associated hemorrhage or significant regional mass effect. 2. Additional small volume acute ischemic nonhemorrhagic right ACA and/or ACA/MCA watershed distribution infarct. 3. Small area of restricted diffusion involving the right anterior temporal pole. While this finding may reflect an additional site of ischemia, a possible cortical contusion could also be considered given the presumed history of trauma. No associated hemorrhage. 4. Small soft tissue contusion at the right frontotemporal scalp. MRA HEAD IMPRESSION: 1. Interval revascularization of previously seen left ICA/MCA occlusion. Asymmetric prominence of distal left MCA branches, consistent with a degree of luxury perfusion status post revascularization. 2. Asymmetric irregularity about the left carotid siphon, indeterminate. While  this finding could be atherosclerotic in nature, a degree of vasospasm could also be present given the recent intervention. 3. Focal flow defect at the distal cervical left ICA as above. While this finding could be artifactual in nature as no discrete stenosis is seen at this location on prior arteriogram, a possible focus of residual and/or recurrent partially occlusive thrombus is difficult to exclude. Associated  moderate approximate 50% stenosis at this level. 4. Otherwise wide patency of the major intracranial arterial vasculature. Electronically Signed   By: Jeannine Boga M.D.   On: 09/26/2021 03:12   ECHOCARDIOGRAM COMPLETE  Result Date: 09/25/2021    ECHOCARDIOGRAM REPORT   Patient Name:   Erica Mcdaniel Date of Exam: 09/25/2021 Medical Rec #:  [id number]           Height:       68.0 in Accession #:    [id number]          Weight:       273.4 lb Date of Birth:  06/01/1970           BSA:          2.334 m Patient Age:    89 years            BP:           122/6 mmHg Patient Gender: F                   HR:           70 bpm. Exam Location:  Inpatient Procedure: 2D Echo, Cardiac Doppler and Color Doppler Indications:    Stroke  History:        Patient has no prior history of Echocardiogram examinations.                 Risk Factors:Hypertension and Current Smoker. GERD.  Sonographer:    Clayton Lefort RDCS (AE) Referring Phys: [id number] [facility]  Sonographer Comments: Patient is obese and echo performed with patient supine and on artificial respirator. Image acquisition challenging due to patient body habitus. IMPRESSIONS  1. Left ventricular ejection fraction, by estimation, is 65 to 70%. The left ventricle has normal function. The left ventricle has no regional wall motion abnormalities. There is mild left ventricular hypertrophy. Left ventricular diastolic parameters were normal.  2. Right ventricular systolic function is hyperdynamic. The right ventricular size is normal. Tricuspid regurgitation  signal is inadequate for assessing PA pressure.  3. The mitral valve is normal in structure. No evidence of mitral valve regurgitation.  4. The aortic valve was not well visualized. There is mild calcification of the aortic valve. Aortic valve regurgitation is not visualized. Aortic valve sclerosis/calcification is present, without any evidence of aortic stenosis.  5. The inferior vena cava is normal in size with greater than 50% respiratory variability, suggesting right atrial pressure of 3 mmHg. Comparison(s): No prior Echocardiogram. FINDINGS  Left Ventricle: Left ventricular ejection fraction, by estimation, is 65 to 70%. The left ventricle has normal function. The left ventricle has no regional wall motion abnormalities. The left ventricular internal cavity size was normal in size. There is  mild left ventricular hypertrophy. Left ventricular diastolic parameters were normal. Right Ventricle: The right ventricular size is normal. No increase in right ventricular wall thickness. Right ventricular systolic function is hyperdynamic. Tricuspid regurgitation signal is inadequate for assessing PA pressure. Left Atrium: Left atrial size was normal in size. Right Atrium: Right atrial size was normal in size. Pericardium: There is no evidence of pericardial effusion. Mitral Valve: The mitral valve is normal in structure. No evidence of mitral valve regurgitation. MV peak gradient, 6.2 mmHg. The mean mitral valve gradient is 3.0 mmHg. Tricuspid Valve: The tricuspid valve is normal in structure. Tricuspid valve regurgitation is not demonstrated. No evidence of tricuspid stenosis. Aortic Valve: The aortic valve was not well visualized. There is mild calcification  of the aortic valve. Aortic valve regurgitation is not visualized. Aortic valve sclerosis/calcification is present, without any evidence of aortic stenosis. Aortic valve mean gradient measures 9.0 mmHg. Aortic valve peak gradient measures 16.5 mmHg. Aortic valve  area, by VTI measures 2.33 cm. Pulmonic Valve: The pulmonic valve was normal in structure. Pulmonic valve regurgitation is not visualized. No evidence of pulmonic stenosis. Aorta: The aortic root, ascending aorta and aortic arch are all structurally normal, with no evidence of dilitation or obstruction. Venous: The inferior vena cava is normal in size with greater than 50% respiratory variability, suggesting right atrial pressure of 3 mmHg. IAS/Shunts: No atrial level shunt detected by color flow Doppler.  LEFT VENTRICLE PLAX 2D LVIDd:         4.50 cm   Diastology LVIDs:         3.00 cm   LV e' medial:    9.90 cm/s LV PW:         1.20 cm   LV E/e' medial:  11.8 LV IVS:        0.80 cm   LV e' lateral:   9.90 cm/s LVOT diam:     2.00 cm   LV E/e' lateral: 11.8 LV SV:         88 LV SV Index:   38 LVOT Area:     3.14 cm  RIGHT VENTRICLE             IVC RV Basal diam:  3.00 cm     IVC diam: 1.90 cm RV S prime:     18.10 cm/s TAPSE (M-mode): 3.0 cm LEFT ATRIUM           Index        RIGHT ATRIUM           Index LA diam:      3.00 cm 1.29 cm/m   RA Area:     11.70 cm LA Vol (A2C): 31.4 ml 13.45 ml/m  RA Volume:   25.20 ml  10.80 ml/m LA Vol (A4C): 55.2 ml 23.65 ml/m  AORTIC VALVE AV Area (Vmax):    2.27 cm AV Area (Vmean):   2.11 cm AV Area (VTI):     2.33 cm AV Vmax:           203.00 cm/s AV Vmean:          140.000 cm/s AV VTI:            0.379 m AV Peak Grad:      16.5 mmHg AV Mean Grad:      9.0 mmHg LVOT Vmax:         147.00 cm/s LVOT Vmean:        94.200 cm/s LVOT VTI:          0.281 m LVOT/AV VTI ratio: 0.74  AORTA Ao Root diam: 2.90 cm Ao Asc diam:  3.00 cm MITRAL VALVE MV Area (PHT): 2.91 cm     SHUNTS MV Area VTI:   2.51 cm     Systemic VTI:  0.28 m MV Peak grad:  6.2 mmHg     Systemic Diam: 2.00 cm MV Mean grad:  3.0 mmHg MV Vmax:       1.25 m/s MV Vmean:      88.6 cm/s MV Decel Time: 261 msec MV E velocity: 117.00 cm/s MV A velocity: 115.00 cm/s MV E/A ratio:  1.02 Rudean Haskell MD  Electronically signed by Rudean Haskell MD Signature Date/Time: 09/25/2021/6:15:04 PM  Final    VAS US CAROTID  Result Date: 09/25/2021 Carotid Arterial Duplex Study Patient Name:  Erica Mcdaniel  Date of Exam:   09/25/2021 Medical Rec #: [id number]            Accession #:    [id number] Date of Birth: 04-01-1970            Patient Gender: F Patient Age:   34 years Exam Location:  [facility] Procedure:      VAS US CAROTID Referring Phys: Janine Ores --------------------------------------------------------------------------------  Indications:       Left stent and Carotid stenosis. Risk Factors:      Hypertension. Other Factors:     Lt stent done 09/25/21. Limitations        Today's exam was limited due to patient on a ventilator and                    patient positioning. Comparison Study:  no prior Performing Technologist: Archie Patten RVS  Examination Guidelines: A complete evaluation includes B-mode imaging, spectral Doppler, color Doppler, and power Doppler as needed of all accessible portions of each vessel. Bilateral testing is considered an integral part of a complete examination. Limited examinations for reoccurring indications may be performed as noted.  Right Carotid Findings: +----------+--------+--------+--------+------------------+--------+           PSV cm/sEDV cm/sStenosisPlaque DescriptionComments +----------+--------+--------+--------+------------------+--------+ CCA Prox  102     26              heterogenous               +----------+--------+--------+--------+------------------+--------+ CCA Distal71      22              heterogenous               +----------+--------+--------+--------+------------------+--------+ ICA Prox  85      27      1-39%   heterogenous               +----------+--------+--------+--------+------------------+--------+ ICA Distal98      38                                          +----------+--------+--------+--------+------------------+--------+ ECA       107     13                                         +----------+--------+--------+--------+------------------+--------+ +----------+--------+-------+--------+-------------------+           PSV cm/sEDV cmsDescribeArm Pressure (mmHG) +----------+--------+-------+--------+-------------------+ [id number]                                         +----------+--------+-------+--------+-------------------+ +---------+--------+--+--------+-+ VertebralPSV cm/s35EDV cm/s7 +---------+--------+--+--------+-+  Left Carotid Findings: +----------+--------+--------+--------+------------------+--------+           PSV cm/sEDV cm/sStenosisPlaque DescriptionComments +----------+--------+--------+--------+------------------+--------+ CCA Prox  84      17              heterogenous               +----------+--------+--------+--------+------------------+--------+ CCA Distal86      24              heterogenous               +----------+--------+--------+--------+------------------+--------+  ICA Distal174     54                                         +----------+--------+--------+--------+------------------+--------+ ECA       107     9                                          +----------+--------+--------+--------+------------------+--------+ +----------+--------+--------+--------+-------------------+           PSV cm/sEDV cm/sDescribeArm Pressure (mmHG) +----------+--------+--------+--------+-------------------+ Subclavian118                                         +----------+--------+--------+--------+-------------------+ +---------+--------+--+--------+--+---------+ VertebralPSV cm/s42EDV cm/s11Antegrade +---------+--------+--+--------+--+---------+  Left Stent(s): +---------------+---+--+---------------+++ Prox to Stent  81 21                 +---------------+---+--+---------------+++ Proximal Stent 78 26                +---------------+---+--+---------------+++ Mid Stent      63 26                +---------------+---+--+---------------+++ Distal Stent   [id number]% stenosis +---------------+---+--+---------------+++ Distal to [id number]                +---------------+---+--+---------------+++    Summary: Right Carotid: Velocities in the right ICA are consistent with a 1-39% stenosis. Left Carotid: Left distal ICA stent consistent with 50-75% stenosis. Vertebrals: Bilateral vertebral arteries demonstrate antegrade flow. *See table(s) above for measurements and observations.  Electronically signed by Antony Contras MD on 09/25/2021 at 4:36:13 PM.    Final    IR US Guide Vasc Access Left  Result Date: 09/25/2021 INDICATION: 51 year old female with past medical history significant for GERD, hypertension, IBS, polycystic ovarian syndrome, arthritis, morbid obesity was last seen normal by her son at 2000 hours on 09/24/2021. Son reports that heard a loud third in the morning around 3:50 a.m. and went up and found the patient on the floor, she was weak in her right side and was aphasic. The patient was brought to the ER by EMS. Last known well: 2000 hours on 09/24/2021 Her NIHSS: 25. MRSA: EXAM: ULTRASOUND-GUIDED VASCULAR ACCESS DIAGNOSTIC CEREBRAL ANGIOGRAM MECHANICAL THROMBECTOMY OF THE LEFT ICA, LEFT M1/MCA, DISTAL M2/MCA OF THE SUPERIOR DIVISION, A3 SEGMENT OF LEFT ACA CAROTID STENT PLACEMENT FOR OCCLUSION OF THE LEFT CAROTID ARTERY FLAT PANEL HEAD CT X2 COMPARISON:  None Available. MEDICATIONS: Verapamil a total of 10 mg at 5 mg, and 2.5 mg increments Cangrelor 15 microgram/kg (patient weighs 114 kg) Aspirin 81 mg and Brilinta 180 mg through the orogastric tube Integrilin 3 mg ANESTHESIA/SEDATION: General anesthesia CONTRAST:  240 mL of Omnipaque 300 FLUOROSCOPY TIME:  Radiation Exposure Index (as provided by the fluoroscopic  device): 4156 mGy Kerma Fluoro time: 98 minute and 12 seconds COMPLICATIONS: None immediate. TECHNIQUE: Following a full explanation of the procedure along with the potential associated complications, an informed witnessed consent was obtained. The risks of intracranial hemorrhage of 10%, worsening neurological deficit, ventilator dependency, death and inability to revascularize were all reviewed in detail with the patient's son and family. The patient was then put under general anesthesia by the Department of Anesthesiology at [facility]. The  left groin was prepped and draped in the usual sterile fashion. Ultrasound examination of the left groin was performed demonstrating widely patent left common femoral artery. Images were recorded and sent to PACs. Under ultrasound guidance and by using a micropuncture cyst, left common femoral artery was accessed 018 inch wire was advanced through the needle and the needle was exchanged for a 4 French microcatheter. Then, inner dilator was removed, 035 inch wire was introduced and 5 [place] sheath was placed. FINDINGS: Left common femoral artery is widely patent. Patent common femoral artery was recorded and sent to the PACS. PROCEDURE: Ultrasound-guided access into the left common femoral artery. IMPRESSION: Ultrasound-guided access into the left common femoral artery. Please refer to the detailed report in the previous dictation. PLAN: 1. Transfer to ICU. 2. Tab. Aspirin 81 mg and Tab. Brilinta 90 mg bid 3. BP goals of 120-140 mm Hg Electronically Signed   By: Frazier Richards M.D.   On: 09/25/2021 14:34   IR PERCUTANEOUS ART THROMBECTOMY/INFUSION INTRACRANIAL INC DIAG ANGIO  Result Date: 09/25/2021 INDICATION: 51 year old female with past medical history significant for GERD, hypertension, IBS, polycystic ovarian syndrome, arthritis, morbid obesity was last seen normal by her son at 2000 hours on 09/24/2021. Son reports that heard a loud third in the morning around  3:50 a.m. and went up and found the patient on the floor, she was weak in her right side and was aphasic. The patient was brought to the ER by EMS. Last known well: 2000 hours on 09/24/2021 Her NIHSS: 25. MRSA: 0 EXAM: ULTRASOUND-GUIDED VASCULAR ACCESS DIAGNOSTIC CEREBRAL ANGIOGRAM MECHANICAL THROMBECTOMY OF THE LEFT ICA, LEFT M1/MCA, DISTAL M2/MCA OF THE SUPERIOR DIVISION, A3 SEGMENT OF LEFT ACA CAROTID STENT PLACEMENT FOR OCCLUSION OF THE LEFT CAROTID ARTERY FLAT PANEL HEAD CT X2 COMPARISON:  None Available. MEDICATIONS: Verapamil a total of 10 mg at 5 mg, and 2.5 mg increments Cangrelor 15 microgram/kg (patient weighs 114 kg) Aspirin 81 mg and  Brilinta 180 mg through the orogastric tube Integrilin 3 mg Performing surgeon: Dr. Frazier Richards Assistants: Dr. Karenann Cai and Dr. Luanne Bras ANESTHESIA/SEDATION: The procedure was performed under general anesthesia. CONTRAST:  240 mL of Omnipaque 300 milligram/mL FLUOROSCOPY: Radiation Exposure Index (as provided by the fluoroscopic device): 4156 mGy Kerma Fluoro time: 98 minute and 12 seconds COMPLICATIONS: None immediate. TECHNIQUE: Informed written consent was obtained from the patient's son after a thorough discussion of the procedural risks, benefits and alternatives. All questions were addressed. Maximal Sterile Barrier Technique was utilized including caps, mask, sterile gowns, sterile gloves, sterile drape, hand hygiene and skin antiseptic. A timeout was performed prior to the initiation of the procedure. The right groin was prepped and draped in the usual sterile fashion. Using a micropuncture kit and the modified Seldinger technique, access was gained to the right common femoral artery and an 8 [place] by 25 cm sheath was placed. Real-time ultrasound guidance was utilized for vascular access including the acquisition of a permanent ultrasound image documenting patency of the accessed vessel. Under fluoroscopy, a Zoom 88 guide catheter was  navigated over a 6 [place] neuron select Berenstein catheter and a 0.035" Terumo Glidewire into the aortic arch. The catheter was placed into the left common carotid artery and biplane DSA carotid angiography was performed. Then, under roadmap guidance, the Glidewire was advanced through the segment of occlusion of the cervical ICA and was advanced into the cavernous aspect of the ICA. The neuron select catheter was advanced over the glidewire into the mid  cervical ICA and the guide catheter was advanced over the neuron select catheter to place its tip at the petrous aspect of the ICA and biplane DSA cerebral angiogram was obtained. FINDINGS: 1. Normal caliber of the right common femoral artery, adequate for vascular access. 2. Occlusion of left cervical ICA at the origin. Biplane cerebral angiogram of petrous ICA injection demonstrated occlusion of proximal left M1-MCA. There are multiple non flow-limiting filling defects seen in the A2 and A3 segments of left ACA. 3. A-comm is patent with cross filling of the contralateral ACA. PROCEDURE: Using biplane roadmap, a zoom 071 aspiration catheter was navigated over an Aristotle 24 microguidewire into the cavernous segment of the left ICA. The Aristotle 24 micro guidewire and the aspiration catheter were then advanced to the level of occlusion at the left M1 MCA and connected to an aspiration pump. Continuous aspiration was performed for 2 minutes. The guide catheter was connected to a VacLok syringe. The aspiration catheter was subsequently removed under constant aspiration. The guide catheter was aspirated for debris. Then, biplane DSA cerebral angiogram was performed by injecting contrast through the guide catheter which demonstrated complete recanalization of the inferior division of the left MCA. However, there was an occlusion of the branch of the superior division of the left MCA seen (mid-distal M2/MCA) (TICI 2b). There was also occlusion of the distal M3 branch  of the superior division of the left MCA seen as well. Then, recanalization of the M2 branch of the MCA was planned. Under roadmap guidance, and using a Zoom 55 aspiration catheter as well as the Aristotle 14 microguidewire, occluded M2 branch was selected the wire was removed and the aspiration catheter was connected to an aspiration pump. Continuous aspiration was performed for 2 minutes. The guide catheter was connected to a VacLock syringe. The aspiration catheter was subsequently removed under constant aspiration. The guide catheter was aspirated for debris. Then, biplane DSA cerebral angiogram was performed by injecting contrast through the guide catheter which demonstrated persistent occlusion of the M2 division of the left MCA. Next, zoom 55 aspiration catheter, phenom 21 microcatheter and Aristotle 14 micro guidewire were selected and under roadmap guidance, the occluded M2 branch of the superior division was selected the microwire and catheter were advanced into the M2 segment, wire was removed, Solitaire X 3 mm x 2 cm was selected and the solitaire stent was deployed spanning the M2 segment. The microcatheter was removed and the aspiration catheter was advanced to the level of the occlusion and was connected to the aspiration pump. The device was allowed to intercalated with the clot for 3 minutes. After 3 minutes, the thrombectomy device and aspiration catheter were removed under constant aspiration. The guide catheter was aspirated for debris. Then, biplane DSA cerebral angiogram was performed by injecting contrast through the guide catheter which demonstrated recanalization of the M2 division (TICI 2c results). However, there were clots seen in the A2 and A3 segments of left ACA. Next, mechanical thrombectomy of the left A2 and A3 segments was planned. A Zoom 55, phenom 21 microcatheter and Arisotle 14 micro guidewire were selected and under roadmap guidance, microwire and microcatheter were navigated  into the pericallosal branch of the left ACA. Next, microcatheter was removed. A 3 mm by 20 mm solitaire X stent was selected and was deployed spanning the A3 segment and extending into the distal A2 segment intercalated with the clot for 3 minutes. The microcatheter was removed. The zoom 55 aspiration catheter was advanced over the wire  to the level of occlusion and connected to the aspiration pump. The thrombectomy device and aspiration catheter were removed under constant aspiration. The guide catheter was aspirated for debris. Then biplane DSA cerebral angiogram was performed by injecting contrast through the guide catheter which demonstrated T2C results. Next, the guide catheter was withdrawn with its distal end in the distal common carotid artery and biplane cerebral angiogram was performed demonstrating about 70% stenoses at the proximal aspect of the ICA with mild non flow-limiting dissection. At this point in time, carotid stent placement was planned. Flat panel CT of the head was performed which demonstrated no subarachnoid or intracranial bleed. There was some contrast staining seen at the head of the left caudate nucleus. At this point in time, orogastric tube was inserted and tablet aspirin 81 mg and Brilinta 180 mg was given. Cangrelor bolus infusion was given as well as per the protocol. Subsequently, a 021 Phenom microcatheter and Aristotle 14 micro guidewire were selected and were navigated into the cavernous ICA. The microwire was exchanged for an exchange length 014 zoom wire. Next, under roadmap guidance, XACT stent [id number] was selected and was deployed in the proximal-mid cervical ICA. Another XACT [id number] stent was selected and was deployed overlapping the previous stent and extending into the distal common carotid artery. In view of residual 50-60% in-stent stenosis, a Viatrac 5 mm x 20 mm balloon was selected and angioplasty of the in-stent stenosis was performed. Next, post angioplasty  carotid and cerebral angiogram was performed. There was minimal in-stent thrombus formation seen. For this, Integrilin a total of 3 mg was injected through the guide sheath. In view of mild irregularity and spasm at the left A1 segment, right carotid and cerebral angiogram was planned. Under ultrasound guidance and using a micropuncture system, the left common femoral artery was accessed and 5 [place] by 10 cm sheath was placed. Five French vertebral artery catheter was advanced over a 035 inch guidewire and selective catheterization of the right common carotid artery and in the internal carotid artery was performed and biplane cerebral angiogram was performed. Biplane DSA angiogram demonstrated intracranial ICA, anterior and middle cerebral artery and their branches to be widely patent. There is patent A-comm seen with excellent cross filling of the contralateral ACA. Venous aspect of the cerebral angiogram is unremarkable Next, vert catheter and the guide catheter were removed. Flat panel CT of the head was performed demonstrating no intracranial bleed. Bilateral femoral angiogram was performed of the right femoral access site was closed using an 8 French Angio-Seal and left femoral access site was closed using 6 [place] Angio-Seal. As the COVID test was not performed and also because of the large body habitus and other comorbid issues, patient was kept intubated and was transferred to the ICU in stable condition. IMPRESSION: 1. Successful mechanical thrombectomy for treatment of an occlusion of the left ICA, left M1/MCA, M2/MCA (superior division), A2 and A3/left ACA with TICI 2C results. Multiple thrombectomy passes performed to achieve this results. 2. Left carotid stents placement. 3. Right carotid and cerebral angiogram demonstrated intracranial right ICA, right anterior and middle cerebral arteries to have a normal appearance. A-comm is widely patent with excellent cross-filling of the left ACA. PLAN: 1.  Transfer to ICU. 2. Tab.  Aspirin 81 mg and Tab. Brilinta 90 mg bid 3. BP goals of 120-140 mm Hg Electronically Signed   By: Frazier Richards M.D.   On: 09/25/2021 14:14   IR US Guide Vasc Access Right  Result Date:  09/25/2021 INDICATION: 51 year old female with past medical history significant for GERD, hypertension, IBS, polycystic ovarian syndrome, arthritis, morbid obesity was last seen normal by her son at 2000 hours on 09/24/2021. Son reports that heard a loud third in the morning around 3:50 a.m. and went up and found the patient on the floor, she was weak in her right side and was aphasic. The patient was brought to the ER by EMS. Last known well: 2000 hours on 09/24/2021 Her NIHSS: 25. MRSA: 0 EXAM: ULTRASOUND-GUIDED VASCULAR ACCESS DIAGNOSTIC CEREBRAL ANGIOGRAM MECHANICAL THROMBECTOMY OF THE LEFT ICA, LEFT M1/MCA, DISTAL M2/MCA OF THE SUPERIOR DIVISION, A3 SEGMENT OF LEFT ACA CAROTID STENT PLACEMENT FOR OCCLUSION OF THE LEFT CAROTID ARTERY FLAT PANEL HEAD CT X2 COMPARISON:  None Available. MEDICATIONS: Verapamil a total of 10 mg at 5 mg, and 2.5 mg increments Cangrelor 15 microgram/kg (patient weighs 114 kg) Aspirin 81 mg and  Brilinta 180 mg through the orogastric tube Integrilin 3 mg Performing surgeon: Dr. Frazier Richards Assistants: Dr. Karenann Cai and Dr. Luanne Bras ANESTHESIA/SEDATION: The procedure was performed under general anesthesia. CONTRAST:  240 mL of Omnipaque 300 milligram/mL FLUOROSCOPY: Radiation Exposure Index (as provided by the fluoroscopic device): 4156 mGy Kerma Fluoro time: 98 minute and 12 seconds COMPLICATIONS: None immediate. TECHNIQUE: Informed written consent was obtained from the patient's son after a thorough discussion of the procedural risks, benefits and alternatives. All questions were addressed. Maximal Sterile Barrier Technique was utilized including caps, mask, sterile gowns, sterile gloves, sterile drape, hand hygiene and skin antiseptic. A timeout was  performed prior to the initiation of the procedure. The right groin was prepped and draped in the usual sterile fashion. Using a micropuncture kit and the modified Seldinger technique, access was gained to the right common femoral artery and an 8 [place] by 25 cm sheath was placed. Real-time ultrasound guidance was utilized for vascular access including the acquisition of a permanent ultrasound image documenting patency of the accessed vessel. Under fluoroscopy, a Zoom 88 guide catheter was navigated over a 6 [place] neuron select Berenstein catheter and a 0.035" Terumo Glidewire into the aortic arch. The catheter was placed into the left common carotid artery and biplane DSA carotid angiography was performed. Then, under roadmap guidance, the Glidewire was advanced through the segment of occlusion of the cervical ICA and was advanced into the cavernous aspect of the ICA. The neuron select catheter was advanced over the glidewire into the mid cervical ICA and the guide catheter was advanced over the neuron select catheter to place its tip at the petrous aspect of the ICA and biplane DSA cerebral angiogram was obtained. FINDINGS: 1. Normal caliber of the right common femoral artery, adequate for vascular access. 2. Occlusion of left cervical ICA at the origin. Biplane cerebral angiogram of petrous ICA injection demonstrated occlusion of proximal left M1-MCA. There are multiple non flow-limiting filling defects seen in the A2 and A3 segments of left ACA. 3. A-comm is patent with cross filling of the contralateral ACA. PROCEDURE: Using biplane roadmap, a zoom 071 aspiration catheter was navigated over an Aristotle 24 microguidewire into the cavernous segment of the left ICA. The Aristotle 24 micro guidewire and the aspiration catheter were then advanced to the level of occlusion at the left M1 MCA and connected to an aspiration pump. Continuous aspiration was performed for 2 minutes. The guide catheter was connected to a  VacLok syringe. The aspiration catheter was subsequently removed under constant aspiration. The guide catheter was aspirated for debris.  Then, biplane DSA cerebral angiogram was performed by injecting contrast through the guide catheter which demonstrated complete recanalization of the inferior division of the left MCA. However, there was an occlusion of the branch of the superior division of the left MCA seen (mid-distal M2/MCA) (TICI 2b). There was also occlusion of the distal M3 branch of the superior division of the left MCA seen as well. Then, recanalization of the M2 branch of the MCA was planned. Under roadmap guidance, and using a Zoom 55 aspiration catheter as well as the Aristotle 14 microguidewire, occluded M2 branch was selected the wire was removed and the aspiration catheter was connected to an aspiration pump. Continuous aspiration was performed for 2 minutes. The guide catheter was connected to a VacLock syringe. The aspiration catheter was subsequently removed under constant aspiration. The guide catheter was aspirated for debris. Then, biplane DSA cerebral angiogram was performed by injecting contrast through the guide catheter which demonstrated persistent occlusion of the M2 division of the left MCA. Next, zoom 55 aspiration catheter, phenom 21 microcatheter and Aristotle 14 micro guidewire were selected and under roadmap guidance, the occluded M2 branch of the superior division was selected the microwire and catheter were advanced into the M2 segment, wire was removed, Solitaire X 3 mm x 2 cm was selected and the solitaire stent was deployed spanning the M2 segment. The microcatheter was removed and the aspiration catheter was advanced to the level of the occlusion and was connected to the aspiration pump. The device was allowed to intercalated with the clot for 3 minutes. After 3 minutes, the thrombectomy device and aspiration catheter were removed under constant aspiration. The guide catheter  was aspirated for debris. Then, biplane DSA cerebral angiogram was performed by injecting contrast through the guide catheter which demonstrated recanalization of the M2 division (TICI 2c results). However, there were clots seen in the A2 and A3 segments of left ACA. Next, mechanical thrombectomy of the left A2 and A3 segments was planned. A Zoom 55, phenom 21 microcatheter and Arisotle 14 micro guidewire were selected and under roadmap guidance, microwire and microcatheter were navigated into the pericallosal branch of the left ACA. Next, microcatheter was removed. A 3 mm by 20 mm solitaire X stent was selected and was deployed spanning the A3 segment and extending into the distal A2 segment intercalated with the clot for 3 minutes. The microcatheter was removed. The zoom 55 aspiration catheter was advanced over the wire to the level of occlusion and connected to the aspiration pump. The thrombectomy device and aspiration catheter were removed under constant aspiration. The guide catheter was aspirated for debris. Then biplane DSA cerebral angiogram was performed by injecting contrast through the guide catheter which demonstrated T2C results. Next, the guide catheter was withdrawn with its distal end in the distal common carotid artery and biplane cerebral angiogram was performed demonstrating about 70% stenoses at the proximal aspect of the ICA with mild non flow-limiting dissection. At this point in time, carotid stent placement was planned. Flat panel CT of the head was performed which demonstrated no subarachnoid or intracranial bleed. There was some contrast staining seen at the head of the left caudate nucleus. At this point in time, orogastric tube was inserted and tablet aspirin 81 mg and Brilinta 180 mg was given. Cangrelor bolus infusion was given as well as per the protocol. Subsequently, a 021 Phenom microcatheter and Aristotle 14 micro guidewire were selected and were navigated into the cavernous ICA.  The microwire was exchanged for  an exchange length 014 zoom wire. Next, under roadmap guidance, XACT stent [id number] was selected and was deployed in the proximal-mid cervical ICA. Another XACT [id number] stent was selected and was deployed overlapping the previous stent and extending into the distal common carotid artery. In view of residual 50-60% in-stent stenosis, a Viatrac 5 mm x 20 mm balloon was selected and angioplasty of the in-stent stenosis was performed. Next, post angioplasty carotid and cerebral angiogram was performed. There was minimal in-stent thrombus formation seen. For this, Integrilin a total of 3 mg was injected through the guide sheath. In view of mild irregularity and spasm at the left A1 segment, right carotid and cerebral angiogram was planned. Under ultrasound guidance and using a micropuncture system, the left common femoral artery was accessed and 5 [place] by 10 cm sheath was placed. Five French vertebral artery catheter was advanced over a 035 inch guidewire and selective catheterization of the right common carotid artery and in the internal carotid artery was performed and biplane cerebral angiogram was performed. Biplane DSA angiogram demonstrated intracranial ICA, anterior and middle cerebral artery and their branches to be widely patent. There is patent A-comm seen with excellent cross filling of the contralateral ACA. Venous aspect of the cerebral angiogram is unremarkable Next, vert catheter and the guide catheter were removed. Flat panel CT of the head was performed demonstrating no intracranial bleed. Bilateral femoral angiogram was performed of the right femoral access site was closed using an 8 French Angio-Seal and left femoral access site was closed using 6 [place] Angio-Seal. As the COVID test was not performed and also because of the large body habitus and other comorbid issues, patient was kept intubated and was transferred to the ICU in stable condition. IMPRESSION: 1.  Successful mechanical thrombectomy for treatment of an occlusion of the left ICA, left M1/MCA, M2/MCA (superior division), A2 and A3/left ACA with TICI 2C results. Multiple thrombectomy passes performed to achieve this results. 2. Left carotid stents placement. 3. Right carotid and cerebral angiogram demonstrated intracranial right ICA, right anterior and middle cerebral arteries to have a normal appearance. A-comm is widely patent with excellent cross-filling of the left ACA. PLAN: 1. Transfer to ICU. 2. Tab.  Aspirin 81 mg and Tab. Brilinta 90 mg bid 3. BP goals of 120-140 mm Hg Electronically Signed   By: Frazier Richards M.D.   On: 09/25/2021 14:14   IR CT Head Ltd  Result Date: 09/25/2021 INDICATION: 51 year old female with past medical history significant for GERD, hypertension, IBS, polycystic ovarian syndrome, arthritis, morbid obesity was last seen normal by her son at 2000 hours on 09/24/2021. Son reports that heard a loud third in the morning around 3:50 a.m. and went up and found the patient on the floor, she was weak in her right side and was aphasic. The patient was brought to the ER by EMS. Last known well: 2000 hours on 09/24/2021 Her NIHSS: 25. MRSA: 0 EXAM: ULTRASOUND-GUIDED VASCULAR ACCESS DIAGNOSTIC CEREBRAL ANGIOGRAM MECHANICAL THROMBECTOMY OF THE LEFT ICA, LEFT M1/MCA, DISTAL M2/MCA OF THE SUPERIOR DIVISION, A3 SEGMENT OF LEFT ACA CAROTID STENT PLACEMENT FOR OCCLUSION OF THE LEFT CAROTID ARTERY FLAT PANEL HEAD CT X2 COMPARISON:  None Available. MEDICATIONS: Verapamil a total of 10 mg at 5 mg, and 2.5 mg increments Cangrelor 15 microgram/kg (patient weighs 114 kg) Aspirin 81 mg and  Brilinta 180 mg through the orogastric tube Integrilin 3 mg Performing surgeon: Dr. Frazier Richards Assistants: Dr. Karenann Cai and Dr. Luanne Bras ANESTHESIA/SEDATION: The  procedure was performed under general anesthesia. CONTRAST:  240 mL of Omnipaque 300 milligram/mL FLUOROSCOPY: Radiation Exposure  Index (as provided by the fluoroscopic device): 4156 mGy Kerma Fluoro time: 98 minute and 12 seconds COMPLICATIONS: None immediate. TECHNIQUE: Informed written consent was obtained from the patient's son after a thorough discussion of the procedural risks, benefits and alternatives. All questions were addressed. Maximal Sterile Barrier Technique was utilized including caps, mask, sterile gowns, sterile gloves, sterile drape, hand hygiene and skin antiseptic. A timeout was performed prior to the initiation of the procedure. The right groin was prepped and draped in the usual sterile fashion. Using a micropuncture kit and the modified Seldinger technique, access was gained to the right common femoral artery and an 8 [place] by 25 cm sheath was placed. Real-time ultrasound guidance was utilized for vascular access including the acquisition of a permanent ultrasound image documenting patency of the accessed vessel. Under fluoroscopy, a Zoom 88 guide catheter was navigated over a 6 [place] neuron select Berenstein catheter and a 0.035" Terumo Glidewire into the aortic arch. The catheter was placed into the left common carotid artery and biplane DSA carotid angiography was performed. Then, under roadmap guidance, the Glidewire was advanced through the segment of occlusion of the cervical ICA and was advanced into the cavernous aspect of the ICA. The neuron select catheter was advanced over the glidewire into the mid cervical ICA and the guide catheter was advanced over the neuron select catheter to place its tip at the petrous aspect of the ICA and biplane DSA cerebral angiogram was obtained. FINDINGS: 1. Normal caliber of the right common femoral artery, adequate for vascular access. 2. Occlusion of left cervical ICA at the origin. Biplane cerebral angiogram of petrous ICA injection demonstrated occlusion of proximal left M1-MCA. There are multiple non flow-limiting filling defects seen in the A2 and A3 segments of left  ACA. 3. A-comm is patent with cross filling of the contralateral ACA. PROCEDURE: Using biplane roadmap, a zoom 071 aspiration catheter was navigated over an Aristotle 24 microguidewire into the cavernous segment of the left ICA. The Aristotle 24 micro guidewire and the aspiration catheter were then advanced to the level of occlusion at the left M1 MCA and connected to an aspiration pump. Continuous aspiration was performed for 2 minutes. The guide catheter was connected to a VacLok syringe. The aspiration catheter was subsequently removed under constant aspiration. The guide catheter was aspirated for debris. Then, biplane DSA cerebral angiogram was performed by injecting contrast through the guide catheter which demonstrated complete recanalization of the inferior division of the left MCA. However, there was an occlusion of the branch of the superior division of the left MCA seen (mid-distal M2/MCA) (TICI 2b). There was also occlusion of the distal M3 branch of the superior division of the left MCA seen as well. Then, recanalization of the M2 branch of the MCA was planned. Under roadmap guidance, and using a Zoom 55 aspiration catheter as well as the Aristotle 14 microguidewire, occluded M2 branch was selected the wire was removed and the aspiration catheter was connected to an aspiration pump. Continuous aspiration was performed for 2 minutes. The guide catheter was connected to a VacLock syringe. The aspiration catheter was subsequently removed under constant aspiration. The guide catheter was aspirated for debris. Then, biplane DSA cerebral angiogram was performed by injecting contrast through the guide catheter which demonstrated persistent occlusion of the M2 division of the left MCA. Next, zoom 55 aspiration catheter, phenom 21 microcatheter and Aristotle  14 micro guidewire were selected and under roadmap guidance, the occluded M2 branch of the superior division was selected the microwire and catheter were  advanced into the M2 segment, wire was removed, Solitaire X 3 mm x 2 cm was selected and the solitaire stent was deployed spanning the M2 segment. The microcatheter was removed and the aspiration catheter was advanced to the level of the occlusion and was connected to the aspiration pump. The device was allowed to intercalated with the clot for 3 minutes. After 3 minutes, the thrombectomy device and aspiration catheter were removed under constant aspiration. The guide catheter was aspirated for debris. Then, biplane DSA cerebral angiogram was performed by injecting contrast through the guide catheter which demonstrated recanalization of the M2 division (TICI 2c results). However, there were clots seen in the A2 and A3 segments of left ACA. Next, mechanical thrombectomy of the left A2 and A3 segments was planned. A Zoom 55, phenom 21 microcatheter and Arisotle 14 micro guidewire were selected and under roadmap guidance, microwire and microcatheter were navigated into the pericallosal branch of the left ACA. Next, microcatheter was removed. A 3 mm by 20 mm solitaire X stent was selected and was deployed spanning the A3 segment and extending into the distal A2 segment intercalated with the clot for 3 minutes. The microcatheter was removed. The zoom 55 aspiration catheter was advanced over the wire to the level of occlusion and connected to the aspiration pump. The thrombectomy device and aspiration catheter were removed under constant aspiration. The guide catheter was aspirated for debris. Then biplane DSA cerebral angiogram was performed by injecting contrast through the guide catheter which demonstrated T2C results. Next, the guide catheter was withdrawn with its distal end in the distal common carotid artery and biplane cerebral angiogram was performed demonstrating about 70% stenoses at the proximal aspect of the ICA with mild non flow-limiting dissection. At this point in time, carotid stent placement was  planned. Flat panel CT of the head was performed which demonstrated no subarachnoid or intracranial bleed. There was some contrast staining seen at the head of the left caudate nucleus. At this point in time, orogastric tube was inserted and tablet aspirin 81 mg and Brilinta 180 mg was given. Cangrelor bolus infusion was given as well as per the protocol. Subsequently, a 021 Phenom microcatheter and Aristotle 14 micro guidewire were selected and were navigated into the cavernous ICA. The microwire was exchanged for an exchange length 014 zoom wire. Next, under roadmap guidance, XACT stent [id number] was selected and was deployed in the proximal-mid cervical ICA. Another XACT [id number] stent was selected and was deployed overlapping the previous stent and extending into the distal common carotid artery. In view of residual 50-60% in-stent stenosis, a Viatrac 5 mm x 20 mm balloon was selected and angioplasty of the in-stent stenosis was performed. Next, post angioplasty carotid and cerebral angiogram was performed. There was minimal in-stent thrombus formation seen. For this, Integrilin a total of 3 mg was injected through the guide sheath. In view of mild irregularity and spasm at the left A1 segment, right carotid and cerebral angiogram was planned. Under ultrasound guidance and using a micropuncture system, the left common femoral artery was accessed and 5 [place] by 10 cm sheath was placed. Five French vertebral artery catheter was advanced over a 035 inch guidewire and selective catheterization of the right common carotid artery and in the internal carotid artery was performed and biplane cerebral angiogram was performed. Biplane DSA angiogram demonstrated intracranial  ICA, anterior and middle cerebral artery and their branches to be widely patent. There is patent A-comm seen with excellent cross filling of the contralateral ACA. Venous aspect of the cerebral angiogram is unremarkable Next, vert catheter and the  guide catheter were removed. Flat panel CT of the head was performed demonstrating no intracranial bleed. Bilateral femoral angiogram was performed of the right femoral access site was closed using an 8 French Angio-Seal and left femoral access site was closed using 6 [place] Angio-Seal. As the COVID test was not performed and also because of the large body habitus and other comorbid issues, patient was kept intubated and was transferred to the ICU in stable condition. IMPRESSION: 1. Successful mechanical thrombectomy for treatment of an occlusion of the left ICA, left M1/MCA, M2/MCA (superior division), A2 and A3/left ACA with TICI 2C results. Multiple thrombectomy passes performed to achieve this results. 2. Left carotid stents placement. 3. Right carotid and cerebral angiogram demonstrated intracranial right ICA, right anterior and middle cerebral arteries to have a normal appearance. A-comm is widely patent with excellent cross-filling of the left ACA. PLAN: 1. Transfer to ICU. 2. Tab.  Aspirin 81 mg and Tab. Brilinta 90 mg bid 3. BP goals of 120-140 mm Hg Electronically Signed   By: Frazier Richards M.D.   On: 09/25/2021 14:14   IR CT Head Ltd  Result Date: 09/25/2021 INDICATION: 51 year old female with past medical history significant for GERD, hypertension, IBS, polycystic ovarian syndrome, arthritis, morbid obesity was last seen normal by her son at 2000 hours on 09/24/2021. Son reports that heard a loud third in the morning around 3:50 a.m. and went up and found the patient on the floor, she was weak in her right side and was aphasic. The patient was brought to the ER by EMS. Last known well: 2000 hours on 09/24/2021 Her NIHSS: 25. MRSA: 0 EXAM: ULTRASOUND-GUIDED VASCULAR ACCESS DIAGNOSTIC CEREBRAL ANGIOGRAM MECHANICAL THROMBECTOMY OF THE LEFT ICA, LEFT M1/MCA, DISTAL M2/MCA OF THE SUPERIOR DIVISION, A3 SEGMENT OF LEFT ACA CAROTID STENT PLACEMENT FOR OCCLUSION OF THE LEFT CAROTID ARTERY FLAT PANEL HEAD CT  X2 COMPARISON:  None Available. MEDICATIONS: Verapamil a total of 10 mg at 5 mg, and 2.5 mg increments Cangrelor 15 microgram/kg (patient weighs 114 kg) Aspirin 81 mg and  Brilinta 180 mg through the orogastric tube Integrilin 3 mg Performing surgeon: Dr. Frazier Richards Assistants: Dr. Karenann Cai and Dr. Luanne Bras ANESTHESIA/SEDATION: The procedure was performed under general anesthesia. CONTRAST:  240 mL of Omnipaque 300 milligram/mL FLUOROSCOPY: Radiation Exposure Index (as provided by the fluoroscopic device): 4156 mGy Kerma Fluoro time: 98 minute and 12 seconds COMPLICATIONS: None immediate. TECHNIQUE: Informed written consent was obtained from the patient's son after a thorough discussion of the procedural risks, benefits and alternatives. All questions were addressed. Maximal Sterile Barrier Technique was utilized including caps, mask, sterile gowns, sterile gloves, sterile drape, hand hygiene and skin antiseptic. A timeout was performed prior to the initiation of the procedure. The right groin was prepped and draped in the usual sterile fashion. Using a micropuncture kit and the modified Seldinger technique, access was gained to the right common femoral artery and an 8 [place] by 25 cm sheath was placed. Real-time ultrasound guidance was utilized for vascular access including the acquisition of a permanent ultrasound image documenting patency of the accessed vessel. Under fluoroscopy, a Zoom 88 guide catheter was navigated over a 6 [place] neuron select Berenstein catheter and a 0.035" Terumo Glidewire into the aortic arch.  The catheter was placed into the left common carotid artery and biplane DSA carotid angiography was performed. Then, under roadmap guidance, the Glidewire was advanced through the segment of occlusion of the cervical ICA and was advanced into the cavernous aspect of the ICA. The neuron select catheter was advanced over the glidewire into the mid cervical ICA and the guide  catheter was advanced over the neuron select catheter to place its tip at the petrous aspect of the ICA and biplane DSA cerebral angiogram was obtained. FINDINGS: 1. Normal caliber of the right common femoral artery, adequate for vascular access. 2. Occlusion of left cervical ICA at the origin. Biplane cerebral angiogram of petrous ICA injection demonstrated occlusion of proximal left M1-MCA. There are multiple non flow-limiting filling defects seen in the A2 and A3 segments of left ACA. 3. A-comm is patent with cross filling of the contralateral ACA. PROCEDURE: Using biplane roadmap, a zoom 071 aspiration catheter was navigated over an Aristotle 24 microguidewire into the cavernous segment of the left ICA. The Aristotle 24 micro guidewire and the aspiration catheter were then advanced to the level of occlusion at the left M1 MCA and connected to an aspiration pump. Continuous aspiration was performed for 2 minutes. The guide catheter was connected to a VacLok syringe. The aspiration catheter was subsequently removed under constant aspiration. The guide catheter was aspirated for debris. Then, biplane DSA cerebral angiogram was performed by injecting contrast through the guide catheter which demonstrated complete recanalization of the inferior division of the left MCA. However, there was an occlusion of the branch of the superior division of the left MCA seen (mid-distal M2/MCA) (TICI 2b). There was also occlusion of the distal M3 branch of the superior division of the left MCA seen as well. Then, recanalization of the M2 branch of the MCA was planned. Under roadmap guidance, and using a Zoom 55 aspiration catheter as well as the Aristotle 14 microguidewire, occluded M2 branch was selected the wire was removed and the aspiration catheter was connected to an aspiration pump. Continuous aspiration was performed for 2 minutes. The guide catheter was connected to a VacLock syringe. The aspiration catheter was  subsequently removed under constant aspiration. The guide catheter was aspirated for debris. Then, biplane DSA cerebral angiogram was performed by injecting contrast through the guide catheter which demonstrated persistent occlusion of the M2 division of the left MCA. Next, zoom 55 aspiration catheter, phenom 21 microcatheter and Aristotle 14 micro guidewire were selected and under roadmap guidance, the occluded M2 branch of the superior division was selected the microwire and catheter were advanced into the M2 segment, wire was removed, Solitaire X 3 mm x 2 cm was selected and the solitaire stent was deployed spanning the M2 segment. The microcatheter was removed and the aspiration catheter was advanced to the level of the occlusion and was connected to the aspiration pump. The device was allowed to intercalated with the clot for 3 minutes. After 3 minutes, the thrombectomy device and aspiration catheter were removed under constant aspiration. The guide catheter was aspirated for debris. Then, biplane DSA cerebral angiogram was performed by injecting contrast through the guide catheter which demonstrated recanalization of the M2 division (TICI 2c results). However, there were clots seen in the A2 and A3 segments of left ACA. Next, mechanical thrombectomy of the left A2 and A3 segments was planned. A Zoom 55, phenom 21 microcatheter and Arisotle 14 micro guidewire were selected and under roadmap guidance, microwire and microcatheter were navigated into the  pericallosal branch of the left ACA. Next, microcatheter was removed. A 3 mm by 20 mm solitaire X stent was selected and was deployed spanning the A3 segment and extending into the distal A2 segment intercalated with the clot for 3 minutes. The microcatheter was removed. The zoom 55 aspiration catheter was advanced over the wire to the level of occlusion and connected to the aspiration pump. The thrombectomy device and aspiration catheter were removed under  constant aspiration. The guide catheter was aspirated for debris. Then biplane DSA cerebral angiogram was performed by injecting contrast through the guide catheter which demonstrated T2C results. Next, the guide catheter was withdrawn with its distal end in the distal common carotid artery and biplane cerebral angiogram was performed demonstrating about 70% stenoses at the proximal aspect of the ICA with mild non flow-limiting dissection. At this point in time, carotid stent placement was planned. Flat panel CT of the head was performed which demonstrated no subarachnoid or intracranial bleed. There was some contrast staining seen at the head of the left caudate nucleus. At this point in time, orogastric tube was inserted and tablet aspirin 81 mg and Brilinta 180 mg was given. Cangrelor bolus infusion was given as well as per the protocol. Subsequently, a 021 Phenom microcatheter and Aristotle 14 micro guidewire were selected and were navigated into the cavernous ICA. The microwire was exchanged for an exchange length 014 zoom wire. Next, under roadmap guidance, XACT stent [id number] was selected and was deployed in the proximal-mid cervical ICA. Another XACT [id number] stent was selected and was deployed overlapping the previous stent and extending into the distal common carotid artery. In view of residual 50-60% in-stent stenosis, a Viatrac 5 mm x 20 mm balloon was selected and angioplasty of the in-stent stenosis was performed. Next, post angioplasty carotid and cerebral angiogram was performed. There was minimal in-stent thrombus formation seen. For this, Integrilin a total of 3 mg was injected through the guide sheath. In view of mild irregularity and spasm at the left A1 segment, right carotid and cerebral angiogram was planned. Under ultrasound guidance and using a micropuncture system, the left common femoral artery was accessed and 5 [place] by 10 cm sheath was placed. Five French vertebral artery catheter  was advanced over a 035 inch guidewire and selective catheterization of the right common carotid artery and in the internal carotid artery was performed and biplane cerebral angiogram was performed. Biplane DSA angiogram demonstrated intracranial ICA, anterior and middle cerebral artery and their branches to be widely patent. There is patent A-comm seen with excellent cross filling of the contralateral ACA. Venous aspect of the cerebral angiogram is unremarkable Next, vert catheter and the guide catheter were removed. Flat panel CT of the head was performed demonstrating no intracranial bleed. Bilateral femoral angiogram was performed of the right femoral access site was closed using an 8 [place] Angio-Seal and left femoral access site was closed using 6 [place] Angio-Seal. As the COVID test was not performed and also because of the large body habitus and other comorbid issues, patient was kept intubated and was transferred to the ICU in stable condition. IMPRESSION: 1. Successful mechanical thrombectomy for treatment of an occlusion of the left ICA, left M1/MCA, M2/MCA (superior division), A2 and A3/left ACA with TICI 2C results. Multiple thrombectomy passes performed to achieve this results. 2. Left carotid stents placement. 3. Right carotid and cerebral angiogram demonstrated intracranial right ICA, right anterior and middle cerebral arteries to have a normal appearance. A-comm is widely patent  with excellent cross-filling of the left ACA. PLAN: 1. Transfer to ICU. 2. Tab.  Aspirin 81 mg and Tab. Brilinta 90 mg bid 3. BP goals of 120-140 mm Hg Electronically Signed   By: Frazier Richards M.D.   On: 09/25/2021 14:14   IR ANGIO INTRA EXTRACRAN SEL COM CAROTID INNOMINATE UNI R MOD SED  Result Date: 09/25/2021 INDICATION: 51 year old female with past medical history significant for GERD, hypertension, IBS, polycystic ovarian syndrome, arthritis, morbid obesity was last seen normal by her son at 2000 hours on  09/24/2021. Son reports that heard a loud third in the morning around 3:50 a.m. and went up and found the patient on the floor, she was weak in her right side and was aphasic. The patient was brought to the ER by EMS. Last known well: 2000 hours on 09/24/2021 Her NIHSS: 25. MRSA: 0 EXAM: ULTRASOUND-GUIDED VASCULAR ACCESS DIAGNOSTIC CEREBRAL ANGIOGRAM MECHANICAL THROMBECTOMY OF THE LEFT ICA, LEFT M1/MCA, DISTAL M2/MCA OF THE SUPERIOR DIVISION, A3 SEGMENT OF LEFT ACA CAROTID STENT PLACEMENT FOR OCCLUSION OF THE LEFT CAROTID ARTERY FLAT PANEL HEAD CT X2 COMPARISON:  None Available. MEDICATIONS: Verapamil a total of 10 mg at 5 mg, and 2.5 mg increments Cangrelor 15 microgram/kg (patient weighs 114 kg) Aspirin 81 mg and  Brilinta 180 mg through the orogastric tube Integrilin 3 mg Performing surgeon: Dr. Frazier Richards Assistants: Dr. Karenann Cai and Dr. Luanne Bras ANESTHESIA/SEDATION: The procedure was performed under general anesthesia. CONTRAST:  240 mL of Omnipaque 300 milligram/mL FLUOROSCOPY: Radiation Exposure Index (as provided by the fluoroscopic device): 4156 mGy Kerma Fluoro time: 98 minute and 12 seconds COMPLICATIONS: None immediate. TECHNIQUE: Informed written consent was obtained from the patient's son after a thorough discussion of the procedural risks, benefits and alternatives. All questions were addressed. Maximal Sterile Barrier Technique was utilized including caps, mask, sterile gowns, sterile gloves, sterile drape, hand hygiene and skin antiseptic. A timeout was performed prior to the initiation of the procedure. The right groin was prepped and draped in the usual sterile fashion. Using a micropuncture kit and the modified Seldinger technique, access was gained to the right common femoral artery and an 8 [place] by 25 cm sheath was placed. Real-time ultrasound guidance was utilized for vascular access including the acquisition of a permanent ultrasound image documenting patency of the  accessed vessel. Under fluoroscopy, a Zoom 88 guide catheter was navigated over a 6 [place] neuron select Berenstein catheter and a 0.035" Terumo Glidewire into the aortic arch. The catheter was placed into the left common carotid artery and biplane DSA carotid angiography was performed. Then, under roadmap guidance, the Glidewire was advanced through the segment of occlusion of the cervical ICA and was advanced into the cavernous aspect of the ICA. The neuron select catheter was advanced over the glidewire into the mid cervical ICA and the guide catheter was advanced over the neuron select catheter to place its tip at the petrous aspect of the ICA and biplane DSA cerebral angiogram was obtained. FINDINGS: 1. Normal caliber of the right common femoral artery, adequate for vascular access. 2. Occlusion of left cervical ICA at the origin. Biplane cerebral angiogram of petrous ICA injection demonstrated occlusion of proximal left M1-MCA. There are multiple non flow-limiting filling defects seen in the A2 and A3 segments of left ACA. 3. A-comm is patent with cross filling of the contralateral ACA. PROCEDURE: Using biplane roadmap, a zoom 071 aspiration catheter was navigated over an Aristotle 24 microguidewire into the cavernous segment of the  left ICA. The Aristotle 24 micro guidewire and the aspiration catheter were then advanced to the level of occlusion at the left M1 MCA and connected to an aspiration pump. Continuous aspiration was performed for 2 minutes. The guide catheter was connected to a VacLok syringe. The aspiration catheter was subsequently removed under constant aspiration. The guide catheter was aspirated for debris. Then, biplane DSA cerebral angiogram was performed by injecting contrast through the guide catheter which demonstrated complete recanalization of the inferior division of the left MCA. However, there was an occlusion of the branch of the superior division of the left MCA seen (mid-distal  M2/MCA) (TICI 2b). There was also occlusion of the distal M3 branch of the superior division of the left MCA seen as well. Then, recanalization of the M2 branch of the MCA was planned. Under roadmap guidance, and using a Zoom 55 aspiration catheter as well as the Aristotle 14 microguidewire, occluded M2 branch was selected the wire was removed and the aspiration catheter was connected to an aspiration pump. Continuous aspiration was performed for 2 minutes. The guide catheter was connected to a VacLock syringe. The aspiration catheter was subsequently removed under constant aspiration. The guide catheter was aspirated for debris. Then, biplane DSA cerebral angiogram was performed by injecting contrast through the guide catheter which demonstrated persistent occlusion of the M2 division of the left MCA. Next, zoom 55 aspiration catheter, phenom 21 microcatheter and Aristotle 14 micro guidewire were selected and under roadmap guidance, the occluded M2 branch of the superior division was selected the microwire and catheter were advanced into the M2 segment, wire was removed, Solitaire X 3 mm x 2 cm was selected and the solitaire stent was deployed spanning the M2 segment. The microcatheter was removed and the aspiration catheter was advanced to the level of the occlusion and was connected to the aspiration pump. The device was allowed to intercalated with the clot for 3 minutes. After 3 minutes, the thrombectomy device and aspiration catheter were removed under constant aspiration. The guide catheter was aspirated for debris. Then, biplane DSA cerebral angiogram was performed by injecting contrast through the guide catheter which demonstrated recanalization of the M2 division (TICI 2c results). However, there were clots seen in the A2 and A3 segments of left ACA. Next, mechanical thrombectomy of the left A2 and A3 segments was planned. A Zoom 55, phenom 21 microcatheter and Arisotle 14 micro guidewire were selected and  under roadmap guidance, microwire and microcatheter were navigated into the pericallosal branch of the left ACA. Next, microcatheter was removed. A 3 mm by 20 mm solitaire X stent was selected and was deployed spanning the A3 segment and extending into the distal A2 segment intercalated with the clot for 3 minutes. The microcatheter was removed. The zoom 55 aspiration catheter was advanced over the wire to the level of occlusion and connected to the aspiration pump. The thrombectomy device and aspiration catheter were removed under constant aspiration. The guide catheter was aspirated for debris. Then biplane DSA cerebral angiogram was performed by injecting contrast through the guide catheter which demonstrated T2C results. Next, the guide catheter was withdrawn with its distal end in the distal common carotid artery and biplane cerebral angiogram was performed demonstrating about 70% stenoses at the proximal aspect of the ICA with mild non flow-limiting dissection. At this point in time, carotid stent placement was planned. Flat panel CT of the head was performed which demonstrated no subarachnoid or intracranial bleed. There was some contrast staining seen at  the head of the left caudate nucleus. At this point in time, orogastric tube was inserted and tablet aspirin 81 mg and Brilinta 180 mg was given. Cangrelor bolus infusion was given as well as per the protocol. Subsequently, a 021 Phenom microcatheter and Aristotle 14 micro guidewire were selected and were navigated into the cavernous ICA. The microwire was exchanged for an exchange length 014 zoom wire. Next, under roadmap guidance, XACT stent [id number] was selected and was deployed in the proximal-mid cervical ICA. Another XACT [id number] stent was selected and was deployed overlapping the previous stent and extending into the distal common carotid artery. In view of residual 50-60% in-stent stenosis, a Viatrac 5 mm x 20 mm balloon was selected and  angioplasty of the in-stent stenosis was performed. Next, post angioplasty carotid and cerebral angiogram was performed. There was minimal in-stent thrombus formation seen. For this, Integrilin a total of 3 mg was injected through the guide sheath. In view of mild irregularity and spasm at the left A1 segment, right carotid and cerebral angiogram was planned. Under ultrasound guidance and using a micropuncture system, the left common femoral artery was accessed and 5 [place] by 10 cm sheath was placed. Five French vertebral artery catheter was advanced over a 035 inch guidewire and selective catheterization of the right common carotid artery and in the internal carotid artery was performed and biplane cerebral angiogram was performed. Biplane DSA angiogram demonstrated intracranial ICA, anterior and middle cerebral artery and their branches to be widely patent. There is patent A-comm seen with excellent cross filling of the contralateral ACA. Venous aspect of the cerebral angiogram is unremarkable Next, vert catheter and the guide catheter were removed. Flat panel CT of the head was performed demonstrating no intracranial bleed. Bilateral femoral angiogram was performed of the right femoral access site was closed using an 8 French Angio-Seal and left femoral access site was closed using 6 [place] Angio-Seal. As the COVID test was not performed and also because of the large body habitus and other comorbid issues, patient was kept intubated and was transferred to the ICU in stable condition. IMPRESSION: 1. Successful mechanical thrombectomy for treatment of an occlusion of the left ICA, left M1/MCA, M2/MCA (superior division), A2 and A3/left ACA with TICI 2C results. Multiple thrombectomy passes performed to achieve this results. 2. Left carotid stents placement. 3. Right carotid and cerebral angiogram demonstrated intracranial right ICA, right anterior and middle cerebral arteries to have a normal appearance. A-comm is  widely patent with excellent cross-filling of the left ACA. PLAN: 1. Transfer to ICU. 2. Tab.  Aspirin 81 mg and Tab. Brilinta 90 mg bid 3. BP goals of 120-140 mm Hg Electronically Signed   By: Frazier Richards M.D.   On: 09/25/2021 14:14   DG Abd Portable 1V  Result Date: 09/25/2021 CLINICAL DATA:  Insertion of endotracheal tube and orogastric tube EXAM: PORTABLE ABDOMEN - 1 VIEW COMPARISON:  Portable exam 1106 hours without priors for comparison FINDINGS: Tip of orogastric tube projects over proximal stomach. Subsegmental atelectasis LEFT lung base. Visualized bowel gas pattern normal. No osseous abnormalities. IMPRESSION: Tip of orogastric tube projects over proximal stomach. LEFT basilar atelectasis. Electronically Signed   By: Lavonia Dana M.D.   On: 09/25/2021 11:16   DG CHEST PORT 1 VIEW  Result Date: 09/25/2021 CLINICAL DATA:  Ventilator dependence. EXAM: PORTABLE CHEST 1 VIEW COMPARISON:  12/01/2012 FINDINGS: 1105 hours. Endotracheal tube tip is 4.4 cm above the base of the carina. The NG tube passes into  the stomach although the distal tip position is not included on the film. Low volume film with vascular congestion. Patchy airspace disease noted right upper lobe, left mid lung and left base. No substantial pleural effusion. Cardiopericardial silhouette is at upper limits of normal for size. Telemetry leads overlie the chest. IMPRESSION: 1. Endotracheal tube tip is 4.4 cm above the base of the carina. 2. Patchy airspace disease bilaterally compatible with asymmetric edema or multifocal pneumonia. Electronically Signed   By: Misty Stanley M.D.   On: 09/25/2021 11:16   IR INTRAVSC STENT CERV CAROTID W/O EMB-PROT MOD SED  Result Date: 09/25/2021 Glori Bickers, MD     09/25/2021 10:55 AM INTERVENTIONAL NEURORADIOLOGY BRIEF POSTPROCEDURE NOTE  DIAGNOSTIC CEREBRAL ANGIOGRAM  Attending: Dr. Frazier Richards  Assistant: Dr. Ladean Raya and Dr. Luanne Bras  Diagnosis: Left cervical ICA and  M1/MCA occlusion  Access site: RCFA, 86F, Left CFA 6 F  Access closure: Angoioseal 8 Fr Rt and Angioseal 6 Fr left  Anesthesia: GETA  Medication used: refer to anesthesia documentation.  Complications: none.  Estimated blood loss: 70 mL  Specimen: None.  Findings: Occlusion of the left ICA, Lt M1/MCA. Mechanical thrombectomy performed with direct contact aspiration x1 with complete recanalization of the MCA(TICI 2b results,). There was occlusion of the left distal M2/MCA, superior division, thrombectomy with contact aspiration and stentriever techniques. Partially occluding thrombus in the distal A2 branches of left ACA, thrombectomy by the stentriever technique with TICI 2c results Left carotid stenosis ~70% followed by delayed occlusion, 2 stent were placed followed by PTA was performed. No intracranial bleed in the flat panel CTs x2 The patient tolerated the procedure well without incident and is transferred to ICU intubated in a stable condition.  PLAN: - Bed rest x6 hours post femoral puncture - SBP 120-140 mmHg -Cangrelor infusion per the protocol Aspirin 81 mg and Brilinta 90 mg   CT ANGIO HEAD NECK W WO CM W PERF (CODE STROKE)  Result Date: 09/25/2021 CLINICAL DATA:  Right-sided weakness EXAM: CT ANGIOGRAPHY HEAD AND NECK TECHNIQUE: Multidetector CT imaging of the head and neck was performed using the standard protocol during bolus administration of intravenous contrast. Multiplanar CT image reconstructions and MIPs were obtained to evaluate the vascular anatomy. Carotid stenosis measurements (when applicable) are obtained utilizing NASCET criteria, using the distal internal carotid diameter as the denominator. RADIATION DOSE REDUCTION: This exam was performed according to the departmental dose-optimization program which includes automated exposure control, adjustment of the mA and/or kV according to patient size and/or use of iterative reconstruction technique. CONTRAST:  38m OMNIPAQUE IOHEXOL 350 MG/ML  SOLN COMPARISON:  None Available. FINDINGS: CTA NECK FINDINGS Aortic arch: Normal arch.  Two vessel branching Right carotid system: Mixed density plaque mainly at the bifurcation without flow limiting stenosis or ulceration. Left carotid system: Mixed density plaque at the bifurcation. Proximal ICA occlusion. Vertebral arteries: No proximal subclavian stenosis. Suboptimal visualization of the vertebral arteries due to streak artifact and suboptimal bolus density. Suspect moderate narrowing at the right V1 segment. No beading or dissection seen. Skeleton: Ordinary cervical spine degeneration. Other neck: Subcutaneous varix in the left upper chest. Upper chest: Ground-glass opacity and interlobular septal thickening with airway cuffing in the upper lungs. Review of the MIP images confirms the above findings CTA HEAD FINDINGS Anterior circulation: Atheromatous calcification of the carotid siphons. Left ICA reconstitution beginning at the ophthalmic segment. There is subsequent left MCA occlusion with abrupt cut off. No additional major branch occlusion is seen.  Negative for aneurysm Posterior circulation: Dominant left vertebral artery. No major branch occlusion, beading, or aneurysm Venous sinuses: Patent. Small volume gas at the left cavernous sinus usually from IV access. Anatomic variants: None significant Review of the MIP images confirms the above findings Expected findings based on prior head CT which are relayed by chat. IMPRESSION: 1. Emergent large vessel occlusion at the left ICA origin. Short segment of intracranial ICA reconstitution with subsequent left MCA thrombosis. 2. Atherosclerosis. Suspect moderate narrowing at the right V1 segment. 3. Apical pulmonary opacity favoring edema Electronically Signed   By: Jorje Guild M.D.   On: 09/25/2021 05:24   CT HEAD CODE STROKE WO CONTRAST  Result Date: 09/25/2021 CLINICAL DATA:  Code stroke.  Right-sided weakness EXAM: CT HEAD WITHOUT CONTRAST TECHNIQUE:  Contiguous axial images were obtained from the base of the skull through the vertex without intravenous contrast. RADIATION DOSE REDUCTION: This exam was performed according to the departmental dose-optimization program which includes automated exposure control, adjustment of the mA and/or kV according to patient size and/or use of iterative reconstruction technique. COMPARISON:  None Available. FINDINGS: Brain: No evidence of acute infarction, hemorrhage, hydrocephalus, extra-axial collection or mass lesion/mass effect. Vascular: Dense left MCA. Skull: Normal. Negative for fracture or focal lesion. Sinuses/Orbits: Negative Other: These results were communicated to Dr. Lorrin Goodell at 5:17 am on 09/25/2021, who is already aware. ASPECTS [facility] Stroke Program Early CT Score) - Ganglionic level infarction (caudate, lentiform nuclei, internal capsule, insula, M1-M3 cortex): 7 - Supraganglionic infarction (M4-M6 cortex): 3 Total score (0-10 with 10 being normal): 10 IMPRESSION: 1. Dense left MCA consistent with thrombosis in the setting. 2. ASPECTS is 10.  No acute hemorrhage. Electronically Signed   By: Jorje Guild M.D.   On: 09/25/2021 05:18    Labs:  CBC: Recent Labs    09/25/21 0505 09/25/21 0510 09/25/21 1154 09/25/21 1806  WBC 14.2*  --   --   --   HGB 14.1 14.6 13.9 13.6  HCT 42.7 43.0 41.0 40.0  PLT 388  --   --   --     COAGS: Recent Labs    09/25/21 0505  INR 0.9  APTT 24    BMP: Recent Labs    09/25/21 0505 09/25/21 0510 09/25/21 1154 09/25/21 1806  NA 140 139 142 143  K 3.2* 3.6 3.4* 3.5  CL 105 102  --   --   CO2 24  --   --   --   GLUCOSE 135* 135*  --   --   BUN 10 13  --   --   CALCIUM 9.4  --   --   --   CREATININE 0.90 0.70  --   --   GFRNONAA >60  --   --   --     LIVER FUNCTION TESTS: Recent Labs    09/25/21 0505  BILITOT 0.3  AST 39  ALT 31  ALKPHOS 73  PROT 6.6  ALBUMIN 3.8    Assessment and Plan:  Code stroke =  Occlusion of the left  ICA, Lt M1/MCA.   Mechanical thrombectomy performed with direct contact aspiration x1 with complete recanalization of the MCA(TICI 2b results).  Occlusion of the left distal M2/MCA, superior division, thrombectomy with contact aspiration and stentriever techniques.  Partially occluding thrombus in the distal A2 branches of left ACA, thrombectomy by the stentriever technique with TICI 2c results   Left carotid stenosis ~70% followed by delayed occlusion, 2 stent were placed followed  by PTA was performed.  Care per neuro and extubate per CCM.  Electronically Signed: Murrell Redden, PA-C 09/26/2021, 12:27 PM    I spent a total of 15 Minutes at the the patient's bedside AND on the patient's hospital floor or unit, greater than 50% of which was counseling/coordinating care for cerebral intervention.

## 2021-09-26 NOTE — Progress Notes (Signed)
   Inpatient Rehab Admissions Coordinator :  Per therapy recommendations patient was screened for CIR candidacy by Serafina Topham RN MSN. Patient is not yet at a level to tolerate the intensity required to pursue a CIR admit . Patient may have the potential to progress to become a candidate. The CIR admissions team will follow and monitor for progress and place a Rehab Consult order if felt to be appropriate. Please contact me with any questions.  Shaquel Josephson RN MSN Admissions Coordinator [phone]  

## 2021-09-26 NOTE — Procedures (Signed)
Extubation Procedure Note  Patient Details:   Name: Erica Mcdaniel DOB: [birth year] MRN: [id number]   Airway Documentation:    Vent end date: 09/26/21 Vent end time: 0915   Evaluation  O2 sats: stable throughout Complications: No apparent complications Patient did tolerate procedure well. Bilateral Breath Sounds: Rhonchi, Diminished   Patient extubated per order to 4L [place] with no apparent complications. Positive cuff leak was noted prior to extubation. Patient is not able to speak at this time but has strong cough. Vitals are stable. RT will continue to monitor.   Khamron Gellert Clyda Greener 09/26/2021, 9:25 AM

## 2021-09-26 NOTE — Evaluation (Signed)
Physical Therapy Evaluation Patient Details Name: Erica Mcdaniel MRN: [id number] DOB: May 14, 1970 Today's Date: 09/26/2021  History of Present Illness  Pt is a 51 y.o. female who presented 09/25/21 with R-sided weakness, aphasia, L gaze deviation, and R hemianopsia. Pt outside window for TNKase. MRI revealed large acute ischemic left MCA distribution infarct, small volume acute ischemic right ACA/MCA watershed distribution infarct, and small soft tissue contusion at the right frontotemporal scalp. S/p cerebral angiogram 8/29. ETT 8/29-8/30. PMH: GERD, HTN, IBS, polycistic ovarian syndrome, arthritis, morbid obesity, cancer, fibromyalgia, anxiety   Clinical Impression  Pt presents with condition above and deficits mentioned below, see PT Problem List. PTA, she was IND without DME and living with her son and the son's girlfriend in a mobile home with 8 STE. Currently, pt is demonstrating deficits in expressive and receptive communication, cognition, balance, activity tolerance, R limbs distal tone, R limbs sensation, and R limbs strength. She demonstrates pushing syndrome to her R and may benefit from visual feedback from a mirror to help her find and maintain her midline. Currently, she is requiring TA x2 for all bed mobility aspects and mod-maxA for sitting balance. As pt has had a drastic functional decline and has good family support, recommending intensive therapy in the AIR setting. Will continue to follow acutely.     Recommendations for follow up therapy are one component of a multi-disciplinary discharge planning process, led by the attending physician.  Recommendations may be updated based on patient status, additional functional criteria and insurance authorization.  Follow Up Recommendations Acute inpatient rehab (3hours/day)      Assistance Recommended at Discharge Frequent or constant Supervision/Assistance  Patient can return home with the following  Two people to help with walking  and/or transfers;Two people to help with bathing/dressing/bathroom;Assistance with cooking/housework;Assistance with feeding;Direct supervision/assist for medications management;Direct supervision/assist for financial management;Assist for transportation;Help with stairs or ramp for entrance    Equipment Recommendations Rolling walker (2 wheels);BSC/3in1;Wheelchair (measurements PT);Wheelchair cushion (measurements PT);Hospital bed;Other (comment) (hoyer equipment; pending progress)  Recommendations for Other Services  Rehab consult    Functional Status Assessment Patient has had a recent decline in their functional status and demonstrates the ability to make significant improvements in function in a reasonable and predictable amount of time.     Precautions / Restrictions Precautions Precautions: Fall;Other (comment) Precaution Comments: watch SpO2; pushes to R Restrictions Weight Bearing Restrictions: No      Mobility  Bed Mobility Overal bed mobility: Needs Assistance Bed Mobility: Rolling, Supine to Sit, Sit to Supine Rolling: Total assist, +2 for physical assistance, +2 for safety/equipment   Supine to sit: Total assist, +2 for physical assistance, +2 for safety/equipment, HOB elevated Sit to supine: Total assist, +2 for physical assistance, +2 for safety/equipment   General bed mobility comments: Pt provided verbal and tactile cues to bring legs off bed and pull on therapist with L UE to sit up L EOB, but pt not following commands except automatically threw L leg off bed a couple times. Cues provided to roll, but again no initiation by pt. Pt requiring TAx2 for all bed mobility aspects.    Transfers                   General transfer comment: deferred for pt safety due to R weakness, pushing syndrome, and pt not following but a few commands today.    Ambulation/Gait               General Gait Details: deferred for  pt safety due to R weakness, pushing syndrome,  and pt not following but a few commands today.  Stairs            Wheelchair Mobility    Modified Rankin (Stroke Patients Only) Modified Rankin (Stroke Patients Only) Pre-Morbid Rankin Score: No symptoms Modified Rankin: Severe disability     Balance Overall balance assessment: Needs assistance Sitting-balance support: Single extremity supported, No upper extremity supported, Feet supported Sitting balance-Leahy Scale: Poor Sitting balance - Comments: Pt pushes self to R with her L UE and L foot, often needing L hand placed in her lap or cues to prop herself over on her L elbow to reduce pushing, mod-maxA for sitting balance. Postural control: Right lateral lean, Posterior lean     Standing balance comment: deferred for pt safety due to R weakness, pushing syndrome, and pt not following but a few commands today.                             Pertinent Vitals/Pain Pain Assessment Pain Assessment: Faces Faces Pain Scale: Hurts a little bit Pain Location: generalized Pain Descriptors / Indicators: Grimacing Pain Intervention(s): Limited activity within patient's tolerance, Monitored during session, Repositioned    Home Living Family/patient expects to be discharged to:: Private residence Living Arrangements: Children (son and son's girlfriend) Available Help at Discharge: Family;Available 24 hours/day (initially) Type of Home: Mobile home Home Access: Stairs to enter Entrance Stairs-Rails: Can reach both Entrance Stairs-Number of Steps: 8   Home Layout: One level Home Equipment: Conservation officer, nature (2 wheels)      Prior Function Prior Level of Function : Independent/Modified Independent;Driving             Mobility Comments: No AD. ADLs Comments: Not working, was on disability.     Hand Dominance   Dominant Hand: Right    Extremity/Trunk Assessment   Upper Extremity Assessment Upper Extremity Assessment: Defer to OT evaluation    Lower  Extremity Assessment Lower Extremity Assessment: RLE deficits/detail RLE Deficits / Details: increased tone noted in her gastrocs; no response to noxious/painful stimuli throughout leg; no active movement noted through her R leg RLE Sensation: decreased light touch RLE Coordination: decreased gross motor;decreased fine motor    Cervical / Trunk Assessment Cervical / Trunk Assessment: Other exceptions Cervical / Trunk Exceptions: increased body habitus  Communication   Communication: Receptive difficulties;Expressive difficulties  Cognition Arousal/Alertness: Awake/alert Behavior During Therapy: Flat affect Overall Cognitive Status: Impaired/Different from baseline Area of Impairment: Attention, Following commands, Safety/judgement, Awareness, Problem solving                   Current Attention Level: Focused   Following Commands: Follows one step commands inconsistently, Follows one step commands with increased time Safety/Judgement: Decreased awareness of safety, Decreased awareness of deficits Awareness: Intellectual Problem Solving: Slow processing, Decreased initiation, Difficulty sequencing, Requires verbal cues, Requires tactile cues General Comments: Pt maintaining a flat effect throughout and not verbalizing at all during session. Pt following only ~x4 simple, multi-modal commands to lift L leg 2x and to pull with L UE 2x. Pt tends to look far to her L, but improves to almost midline when L peripheral visual field is blocked by therapist. Pt does not sustain midline though. Pt pushes self to her R with her L UE, indicating poor awareness of her deficits and safety.        General Comments General comments (skin integrity, edema, etc.):  VSS on 4L O2    Exercises     Assessment/Plan    PT Assessment Patient needs continued PT services  PT Problem List Decreased strength;Decreased activity tolerance;Decreased balance;Decreased range of motion;Decreased  mobility;Decreased coordination;Decreased cognition;Decreased knowledge of use of DME;Decreased safety awareness;Cardiopulmonary status limiting activity;Impaired sensation       PT Treatment Interventions DME instruction;Gait training;Stair training;Functional mobility training;Therapeutic activities;Therapeutic exercise;Balance training;Neuromuscular re-education;Cognitive remediation;Patient/family education;Wheelchair mobility training    PT Goals (Current goals can be found in the Care Plan section)  Acute Rehab PT Goals Patient Stated Goal: did not state; family wants pt to improve PT Goal Formulation: With patient/family Time For Goal Achievement: 10/11/21 Potential to Achieve Goals: Fair    Frequency Min 4X/week     Co-evaluation PT/OT/SLP Co-Evaluation/Treatment: Yes Reason for Co-Treatment: Necessary to address cognition/behavior during functional activity;For patient/therapist safety;To address functional/ADL transfers PT goals addressed during session: Mobility/safety with mobility;Balance         AM-PAC PT "6 Clicks" Mobility  Outcome Measure Help needed turning from your back to your side while in a flat bed without using bedrails?: Total Help needed moving from lying on your back to sitting on the side of a flat bed without using bedrails?: Total Help needed moving to and from a bed to a chair (including a wheelchair)?: Total Help needed standing up from a chair using your arms (e.g., wheelchair or bedside chair)?: Total Help needed to walk in hospital room?: Total Help needed climbing 3-5 steps with a railing? : Total 6 Click Score: 6    End of Session Equipment Utilized During Treatment: Oxygen Activity Tolerance: Patient tolerated treatment well Patient left: in bed;with call bell/phone within reach;with bed alarm set;with family/visitor present   PT Visit Diagnosis: Difficulty in walking, not elsewhere classified (R26.2);Unsteadiness on feet (R26.81);Muscle  weakness (generalized) (M62.81);Other symptoms and signs involving the nervous system (R29.898);Hemiplegia and hemiparesis Hemiplegia - Right/Left: Right Hemiplegia - dominant/non-dominant: Dominant Hemiplegia - caused by: Cerebral infarction    Time: 1025-1052 PT Time Calculation (min) (ACUTE ONLY): 27 min   Charges:   PT Evaluation $PT Eval Moderate Complexity: 1 Mod          Moishe Spice, PT, DPT Acute Rehabilitation Services  Office: [phone]   Orvan Falconer 09/26/2021, 11:39 AM

## 2021-09-26 NOTE — Progress Notes (Signed)
Orthopedic Tech Progress Note Patient Details:  Erica Mcdaniel [birth year] [id number]  Ortho Devices Type of Ortho Device: Prafo boot/shoe Ortho Device/Splint Location: RLE Ortho Device/Splint Interventions: Ordered, Adjustment, Application   Post Interventions Patient Tolerated: Well Instructions Provided: Care of device  Arville Go 09/26/2021, 12:30 PM

## 2021-09-26 NOTE — Evaluation (Signed)
Occupational Therapy Evaluation Patient Details Name: Erica Mcdaniel MRN: [id number] DOB: [birth year] Today's Date: 09/26/2021   History of Present Illness Pt is a 51 y.o. female who presented 09/25/21 with R-sided weakness, aphasia, L gaze deviation, and R hemianopsia. Pt outside window for TNKase. MRI revealed large acute ischemic left MCA distribution infarct, small volume acute ischemic right ACA/MCA watershed distribution infarct, and small soft tissue contusion at the right frontotemporal scalp. S/p cerebral angiogram 8/29. ETT 8/29-8/30. PMH: GERD, HTN, IBS, polycistic ovarian syndrome, arthritis, morbid obesity, cancer, fibromyalgia, anxiety   Clinical Impression   Erica Mcdaniel was evaluated s/p the above CVA. Per her son, she is typically indep with ADLs and mobility. Upon evaluation pt was limited by dense R hemiplegia, L gaze, R inattention, impaired communication and cognition and poor midline awareness. She is total A +2 for all aspects of her care and mobility at bed level. Pt followed 2 multimodal cues. Family present and supportive. OT to continue to follow acutely. Recommend d/c to AIR in anticipation she will progress acutely to be able to tolerate intensity.      Recommendations for follow up therapy are one component of a multi-disciplinary discharge planning process, led by the attending physician.  Recommendations may be updated based on patient status, additional functional criteria and insurance authorization.   Follow Up Recommendations  Acute inpatient rehab (3hours/day)    Assistance Recommended at Discharge Frequent or constant Supervision/Assistance  Patient can return home with the following A lot of help with walking and/or transfers;Two people to help with walking and/or transfers;A lot of help with bathing/dressing/bathroom;Two people to help with bathing/dressing/bathroom;Assistance with cooking/housework;Assistance with feeding;Direct supervision/assist for  medications management;Assist for transportation;Direct supervision/assist for financial management;Help with stairs or ramp for entrance    Functional Status Assessment  Patient has had a recent decline in their functional status and demonstrates the ability to make significant improvements in function in a reasonable and predictable amount of time.  Equipment Recommendations  Other (comment) (defer)    Recommendations for Other Services Rehab consult     Precautions / Restrictions Precautions Precautions: Fall;Other (comment) Precaution Comments: watch SpO2; pushes to R Restrictions Weight Bearing Restrictions: No      Mobility Bed Mobility Overal bed mobility: Needs Assistance Bed Mobility: Rolling, Supine to Sit, Sit to Supine Rolling: Total assist, +2 for physical assistance, +2 for safety/equipment   Supine to sit: Total assist, +2 for physical assistance, +2 for safety/equipment, HOB elevated Sit to supine: Total assist, +2 for physical assistance, +2 for safety/equipment   General bed mobility comments: Pt provided verbal and tactile cues to bring legs off bed and pull on therapist with L UE to sit up L EOB, but pt not following commands except automatically threw L leg off bed a couple times. Cues provided to roll, but again no initiation by pt. Pt requiring TAx2 for all bed mobility aspects.    Transfers                   General transfer comment: deferred for pt safety due to R weakness, pushing syndrome, and pt not following but a few commands today.      Balance Overall balance assessment: Needs assistance Sitting-balance support: Single extremity supported, No upper extremity supported, Feet supported Sitting balance-Leahy Scale: Zero Sitting balance - Comments: Pt pushes self to R with her L UE and L foot, often needing L hand placed in her lap or cues to prop herself over on her L elbow to  reduce pushing, mod-maxA for sitting balance. Postural control:  Right lateral lean, Posterior lean     Standing balance comment: deferred for pt safety due to R weakness, pushing syndrome, and pt not following but a few commands today.                           ADL either performed or assessed with clinical judgement   ADL Overall ADL's : Needs assistance/impaired                                       General ADL Comments: total A for all aspects     Vision Baseline Vision/History: 0 No visual deficits Vision Assessment?: Vision impaired- to be further tested in functional context Additional Comments: R gaze, able to come to midline with blocking R periph adn max cues. did not cross. Would benefit from R occlusion glasses     Perception     Praxis      Pertinent Vitals/Pain Pain Assessment Pain Assessment: Faces Faces Pain Scale: Hurts a little bit Pain Location: generalized Pain Descriptors / Indicators: Grimacing Pain Intervention(s): Limited activity within patient's tolerance, Monitored during session     Hand Dominance Right   Extremity/Trunk Assessment Upper Extremity Assessment Upper Extremity Assessment: RUE deficits/detail;LUE deficits/detail RUE Deficits / Details: flaccid RUE Sensation: decreased light touch;decreased proprioception RUE Coordination: decreased fine motor;decreased gross motor LUE Deficits / Details: WFl, very strong pusher   Lower Extremity Assessment Lower Extremity Assessment: Defer to PT evaluation   Cervical / Trunk Assessment Cervical / Trunk Assessment: Other exceptions Cervical / Trunk Exceptions: increased body habitus   Communication Communication Communication: Receptive difficulties;Expressive difficulties   Cognition Arousal/Alertness: Awake/alert Behavior During Therapy: Flat affect Overall Cognitive Status: Impaired/Different from baseline Area of Impairment: Attention, Following commands, Safety/judgement, Awareness, Problem solving                    Current Attention Level: Focused   Following Commands: Follows one step commands inconsistently, Follows one step commands with increased time Safety/Judgement: Decreased awareness of safety, Decreased awareness of deficits Awareness: Intellectual Problem Solving: Slow processing, Decreased initiation, Difficulty sequencing, Requires verbal cues, Requires tactile cues General Comments: Pt maintaining a flat effect throughout and not verbalizing at all during session. Pt following only ~x4 simple, multi-modal commands to lift L leg 2x and to pull with L UE 2x. Pt tends to look far to her L, but improves to almost midline when L peripheral visual field is blocked by therapist. Pt does not sustain midline though. Pt pushes self to her R with her L UE, indicating poor awareness of her deficits and safety.     General Comments  VSS on 4L    Exercises     Shoulder Instructions      Home Living Family/patient expects to be discharged to:: Private residence Living Arrangements: Children Available Help at Discharge: Family;Available 24 hours/day Type of Home: Mobile home Home Access: Stairs to enter Entrance Stairs-Number of Steps: 8 Entrance Stairs-Rails: Can reach both Home Layout: One level     Bathroom Shower/Tub: Teacher, early years/pre: Standard Bathroom Accessibility: Yes   Home Equipment: Conservation officer, nature (2 wheels)   Additional Comments: supportive family      Prior Functioning/Environment Prior Level of Function : Independent/Modified Independent;Driving  Mobility Comments: No AD. ADLs Comments: Not working, was on disability.        OT Problem List: Decreased strength;Decreased range of motion;Decreased activity tolerance;Impaired balance (sitting and/or standing);Impaired vision/perception;Decreased coordination;Decreased cognition;Decreased knowledge of use of DME or AE;Decreased safety awareness;Decreased knowledge of  precautions;Impaired tone;Impaired sensation;Obesity;Impaired UE functional use      OT Treatment/Interventions: Therapeutic exercise;Self-care/ADL training;DME and/or AE instruction;Therapeutic activities;Patient/family education;Balance training;Visual/perceptual remediation/compensation    OT Goals(Current goals can be found in the care plan section) Acute Rehab OT Goals Patient Stated Goal: unable to state OT Goal Formulation: With patient/family Time For Goal Achievement: 10/10/21 Potential to Achieve Goals: Good ADL Goals Pt Will Perform Grooming: with mod assist;sitting Pt Will Perform Upper Body Dressing: with max assist;sitting Pt Will Transfer to Toilet: with max assist;with +2 assist;stand pivot transfer;bedside commode Additional ADL Goal #1: Pt will locate at least 3 grooming items place just R of midline with moderate cues Additional ADL Goal #2: Pt will sit EOB with min A for at least 5 minutes as a precursor to ADLs  OT Frequency: Min 2X/week    Co-evaluation PT/OT/SLP Co-Evaluation/Treatment: Yes Reason for Co-Treatment: Complexity of the patient's impairments (multi-system involvement);For patient/therapist safety;To address functional/ADL transfers   OT goals addressed during session: ADL's and self-care      AM-PAC OT "6 Clicks" Daily Activity     Outcome Measure Help from another person eating meals?: Total Help from another person taking care of personal grooming?: Total Help from another person toileting, which includes using toliet, bedpan, or urinal?: Total Help from another person bathing (including washing, rinsing, drying)?: Total Help from another person to put on and taking off regular upper body clothing?: Total Help from another person to put on and taking off regular lower body clothing?: Total 6 Click Score: 6   End of Session Equipment Utilized During Treatment: Oxygen Nurse Communication: Mobility status  Activity Tolerance: Patient tolerated  treatment well Patient left: in bed;with call bell/phone within reach;with bed alarm set;with family/visitor present  OT Visit Diagnosis: Unsteadiness on feet (R26.81);Other abnormalities of gait and mobility (R26.89);Repeated falls (R29.6);History of falling (Z91.81);Pain                Time: 6295-2841 OT Time Calculation (min): 29 min Charges:  OT General Charges $OT Visit: 1 Visit OT Evaluation $OT Eval Moderate Complexity: 1 Mod    Cleaster Shiffer A Dung Prien 09/26/2021, 4:40 PM

## 2021-09-26 NOTE — Progress Notes (Signed)
  Transition of Care [facility]) Screening Note   Patient Details  Name: Erica Mcdaniel Date of Birth: [birth year]   [facility]) CM/SW Contact:    Benard Halsted, LCSW Phone Number: 09/26/2021, 3:06 PM    Transition of Care Department [facility]) has reviewed patient. We will continue to monitor patient advancement through interdisciplinary progression rounds. If new patient transition needs arise, please place a TOC consult.

## 2021-09-26 NOTE — Progress Notes (Signed)
Per pharmacy, 100 mg Wellbutrin 12 hr tablet is not crushable and will need to be changed to a non long-acting to be given per tube. Per Sal, MD, not a concern for now, can hold off on Wellbutrin for tonight and will reevaluate in in the morning with dayshift MD.    Royal Piedra, RN

## 2021-09-26 NOTE — Progress Notes (Signed)
NAME:  Erica Mcdaniel, MRN:  [id number], DOB:  21-Jul-1970, LOS: 1 ADMISSION DATE:  09/25/2021 CONSULTATION DATE:  09/25/2021 REFERRING MD:  Lorrin Goodell - Neuro, CHIEF COMPLAINT:  L ICA occlusion   History of Present Illness:  51 year old woman who presented to [facility] ED via EMS 8/29 as a Code Stroke. LKN 2130 8/28. Not a TNK candidate. PMHx significant for HTN, tobacco abuse (2 PPD) + THC, GERD, tubular adenoma (colon), IBS, PCOS, skin CA (SCC), fibromyalgia, depression/anxiety.  Patient initially presented to ED as a Code Stroke after being found down at home by son. LKN 2130 8/28. Family heard a loud thud around 0350 8/29 and found patient on the floor. Presented with unilateral R-sided flaccidity and global aphasia. NIHSS 26. Out of the window for TNK. CT Head with dense L MCA c/w thrombosis. CTA Head/Neck demonstrated emergent LVO at the L ICA and subsequent L MCA thrombosis. Patient was taken emergently to [place]. Findings included L ICA and M1/MCA occlusion. Mechanical thrombectomy was performed with MCA TICI 2b recanalization; occlusion of L distal M2/MCA and partial occlusion of distal A2 branches of L ACA noted with TICI 2c recanalization. Stent x 2 to L carotid artery placed. Post-procedure CT negative for bleed. Transferred to 4N.  PCCM consulted for vent/BP management.  Pertinent Medical History:   Past Medical History:  Diagnosis Date   Anxiety    Arthritis    Cancer ([place])    skin squamous cell arm   Depression    Fibromyalgia    GERD (gastroesophageal reflux disease)    Hypertension    IBS (irritable bowel syndrome)    Pneumonia    hx   Polycystic ovarian disease    Tendonitis    Tubular adenoma of colon 2018   multiple   Significant Hospital Events: Including procedures, antibiotic start and stop dates in addition to other pertinent events   8/28 LKN 2130 8/29 Found down ~0350 by son with R-sided deficits, aphasic. Brought to [facility] ED via EMS. Code Stroke called. CT Head  with dense L MCA, c/f LVO. Not a TNK candidate. Taken to [facility] for thrombectomy/recanalization. Post-procedure CT Head negative for bleed.  Interim History / Subjective:  Ventilated overnight. Wheezing has improved. Family confirms that she is a smoker but denies any functional limitations.   Objective:  Blood pressure 97/63, pulse 89, temperature 98.5 F (36.9 C), temperature source Axillary, resp. rate (!) 23, height '5\' 8"'$  (1.727 m), weight 124 kg, SpO2 99 %.    Vent Mode: PSV;CPAP FiO2 (%):  [40 %-100 %] 40 % Set Rate:  [18 bmp] 18 bmp Vt Set:  [510 mL] 510 mL PEEP:  [8 cmH20] 8 cmH20 Pressure Support:  [8 cmH20] 8 cmH20 Plateau Pressure:  [13 cmH20-27 cmH20] 24 cmH20   Intake/Output Summary (Last 24 hours) at 09/26/2021 0911 Last data filed at 09/26/2021 0800 Gross per 24 hour  Intake 3017.03 ml  Output 4235 ml  Net -1217.97 ml    Filed Weights   09/25/21 1045  Weight: 124 kg    Physical Examination: General: Acutely ill-appearing middle-aged woman in NAD. Intubated, sedated. HEENT: [place]/AT, anicteric sclera, PERRL, moist mucous membranes. ETT/OGT in place. Neuro: On minimal sedation she is awake with left gaze preference. Moving left side with normal strength, minimal movement on the right. Not following commands. Appears uncomfortable from ETT>   CV: RRR, no m/g/r. PULM: Tolerating SBT with few wheezes GI: Obese, soft, nontender, nondistended. Normoactive bowel sounds. Extremities: Trace symmetric BLE edema noted. Skin:  Warm/dry, no rashes.  Ancillary tests personally reviewed:  MRI shows moderately sized right frontal MCA stroke without mass effect.  Artifactual left ICA stenosis on Doppler and MRA due to stent.  Type iii dyslipidemia, mildly elevated HBA1C   Assessment & Plan:  L ICA and M1/MCA occlusion and left A2/3 occlusion s/p thrombectomy and carotid stenting - maintain SBP 120-140  Acute hypoxemic respiratory failure, periprocedural on background of OSA,  tobacco abuse  No formal diagnosis of COPD - extubate today. - at risk for reintubation due to inability to protect airway from stroke - continue bronchodilators for now - counsel regarding tobacco, cessation - Zyban to prevent relapse post discharge. Will also help with weight loss. - CPAP while in hospital  - would benefit from PFT's but may not be able cooperate due to neurological deficits.    Hypertension Home regimen: HCTZ. - Continue Cleviprex as above, titrated to goal SBP - Hydralazine, Labetalol PRN - Restart home medications to start  Hyperlipidemia - F/u lipid panel - Start rosuvastatin  GERD IBS - PPI - Hold Bentyl for now - Bowel regimen with PAD protocol  Best Practice: (right click and "Reselect all SmartList Selections" daily)   Diet/type: NPO w/ meds via tube  - swallow evaluation today  DVT prophylaxis: SCDs Lovenox GI prophylaxis: PPI Lines: N/A Foley:  N/A - will disontinue.  Code Status:  full code Last date of multidisciplinary goals of care discussion [family  updated at bedside and advised regarding extubation, risk of reintubation, expectations for rehabilitation, secondary stroke prevention risk modification. ]   Critical care time: 45 minutes   The patient is critically ill with multiple organ system failure and requires high complexity decision making for assessment and support, frequent evaluation and titration of therapies, advanced monitoring, review of radiographic studies and interpretation of complex data.   Critical Care Time devoted to patient care services, exclusive of separately billable procedures, described in this note is 46 minutes.  Kipp Brood, MD [facility] 09/26/21 9:11 AM  Please see [email or web address] for pager details.  From 7A-7P if no response, please call [phone] After hours, please call ELink [phone]

## 2021-09-26 NOTE — Progress Notes (Signed)
Pt due for Brilinta at 2200. Discussed with stroke MD. RN to place NG tube overnight for administration of Brilinta, given pt failed swallow screen. Can discontinue once patient gets Cortrak in AM.   2144: NG tube placed at 69 cm, pt tolerated well. X ray showed tube was in stomach but proximal port lied in distal esophagus, tube needed to be advanced. This RN advanced tube, will await re-verification of placement before giving meds.  Royal Piedra, RN

## 2021-09-26 NOTE — Progress Notes (Signed)
NG tube being placed.  CPAP without at this time.

## 2021-09-26 NOTE — Telephone Encounter (Signed)
Pharmacy Patient Advocate Encounter  Insurance verification completed.    The patient is insured through [facility] Part D   The patient is currently admitted and ran test claims for the following: Brilinta .  Copays and coinsurance results were relayed to Inpatient clinical team.

## 2021-09-26 NOTE — TOC Benefit Eligibility Note (Signed)
Patient Teacher, English as a foreign language completed.    The patient is currently admitted and upon discharge could be taking Brilinta 90 mg.  The current 30 day co-pay is $10.35.   The patient is insured through [place], [place] Patient Advocate Specialist [place] Patient Advocate Team Direct Number: [phone]  Fax: [phone]

## 2021-09-27 ENCOUNTER — Inpatient Hospital Stay (HOSPITAL_COMMUNITY): Payer: Medicare HMO

## 2021-09-27 DIAGNOSIS — I63512 Cerebral infarction due to unspecified occlusion or stenosis of left middle cerebral artery: Secondary | ICD-10-CM | POA: Diagnosis not present

## 2021-09-27 LAB — GLUCOSE, CAPILLARY: Glucose-Capillary: 120 mg/dL — ABNORMAL HIGH (ref 70–99)

## 2021-09-27 LAB — PHOSPHORUS
Phosphorus: 2.5 mg/dL (ref 2.5–4.6)
Phosphorus: 2.3 mg/dL — ABNORMAL LOW (ref 2.5–4.6)

## 2021-09-27 LAB — LIPID PANEL
Cholesterol: 233 mg/dL — ABNORMAL HIGH (ref 0–200)
HDL: 45 mg/dL (ref >40)
LDL Cholesterol: 139 mg/dL — ABNORMAL HIGH (ref 0–99)
Total CHOL/HDL Ratio: 5.2 RATIO
Triglycerides: 243 mg/dL — ABNORMAL HIGH (ref <150)
VLDL: 49 mg/dL — ABNORMAL HIGH (ref 0–40)

## 2021-09-27 LAB — MAGNESIUM
Magnesium: 2.3 mg/dL (ref 1.7–2.4)
Magnesium: 2.3 mg/dL (ref 1.7–2.4)

## 2021-09-27 MED ORDER — VITAL HIGH PROTEIN PO LIQD
1000.0000 mL | ORAL | Status: DC
  Administered 2021-09-27: 1000 mL

## 2021-09-27 MED ORDER — OSMOLITE 1.5 CAL PO LIQD
1000.0000 mL | ORAL | Status: DC
Start: 2021-09-27 — End: 2021-10-04
  Administered 2021-09-28 – 2021-10-04 (×5): 1000 mL
  Filled 2021-09-27: qty 1000

## 2021-09-27 MED ORDER — VITAL HIGH PROTEIN PO LIQD: 1000.0000 mL | ORAL | Status: AC

## 2021-09-27 MED ORDER — PROSOURCE TF20 ENFIT COMPATIBL EN LIQD
60.0000 mL | Freq: Every day | ENTERAL | Status: DC
  Administered 2021-09-27: 60 mL
  Filled 2021-09-27: qty 60

## 2021-09-27 MED ORDER — VITAL HIGH PROTEIN PO LIQD: 1000.0000 mL | ORAL | Status: DC

## 2021-09-27 MED ORDER — QUETIAPINE FUMARATE 25 MG PO TABS
25.0000 mg | ORAL_TABLET | Freq: Two times a day (BID) | ORAL | Status: DC
  Administered 2021-09-27 – 2021-10-10 (×28): 25 mg
  Filled 2021-09-27 (×28): qty 1

## 2021-09-27 MED ORDER — VITAMIN B-12 100 MCG PO TABS
50.0000 ug | ORAL_TABLET | Freq: Every day | ORAL | Status: DC
  Administered 2021-09-27 – 2021-10-02 (×6): 50 ug
  Filled 2021-09-27 (×8): qty 1

## 2021-09-27 MED ORDER — LISINOPRIL 10 MG PO TABS
10.0000 mg | ORAL_TABLET | Freq: Every day | ORAL | Status: DC
Start: 2021-09-27 — End: 2021-10-02
  Administered 2021-09-27 – 2021-10-02 (×6): 10 mg
  Filled 2021-09-27 (×6): qty 1

## 2021-09-27 MED ORDER — SODIUM CHLORIDE 0.9 % IV SOLN: INTRAVENOUS | Status: DC

## 2021-09-27 NOTE — Progress Notes (Signed)
Pt unable to wear CPAP due to mental status. VSS, sats appropriate. RN aware. RT will cont to montior as needed.

## 2021-09-27 NOTE — Progress Notes (Signed)
SLP Cancellation Note  Patient Details Name: Erica Mcdaniel MRN: [id number] DOB: Sep 15, 1970   Cancelled treatment:       Reason Eval/Treat Not Completed: Fatigue/lethargy limiting ability to participate Gabriel Rainwater MA, [place] 09/27/2021, 9:04 AM

## 2021-09-27 NOTE — Progress Notes (Addendum)
STROKE TEAM PROGRESS NOTE   INTERVAL HISTORY Patient is seen in her room with no family at the bedside.  She has been hemodynamically stable and tolerating extubation well and her neurological exam remains stable.  Will plan to start tube feedings today, resume home meds and transfer out of the ICU. Vital signs stable.  Neurologically remains globally aphasic with dense right hemiplegia but is beginning to visually track. Vitals:   09/27/21 0600 09/27/21 0700 09/27/21 0748 09/27/21 0800  BP: 132/84 (!) 139/117  (!) 153/57  Pulse: 90 78 95 99  Resp: (!) 23 (!) 23 (!) 24 (!) 23  Temp:    98.3 F (36.8 C)  TempSrc:    Axillary  SpO2: 92% 99% 94% 92%  Weight:      Height:       CBC:  Recent Labs  Lab 09/25/21 0505 09/25/21 0510 09/25/21 1154 09/25/21 1806  WBC 14.2*  --   --   --   NEUTROABS 6.8  --   --   --   HGB 14.1   < > 13.9 13.6  HCT 42.7   < > 41.0 40.0  MCV 90.9  --   --   --   PLT 388  --   --   --    < > = values in this interval not displayed.    Basic Metabolic Panel:  Recent Labs  Lab 09/25/21 0505 09/25/21 0510 09/25/21 1154 09/25/21 1806 09/27/21 0543  NA 140 139 142 143  --   K 3.2* 3.6 3.4* 3.5  --   CL 105 102  --   --   --   CO2 24  --   --   --   --   GLUCOSE 135* 135*  --   --   --   BUN 10 13  --   --   --   CREATININE 0.90 0.70  --   --   --   CALCIUM 9.4  --   --   --   --   MG  --   --   --   --  2.3  PHOS  --   --   --   --  2.5    Lipid Panel:  Recent Labs  Lab 09/27/21 0543  CHOL 233*  TRIG 243*  HDL 45  CHOLHDL 5.2  VLDL 49*  LDLCALC 139*    HgbA1c:  Recent Labs  Lab 09/25/21 0505  HGBA1C 5.8*    Urine Drug Screen:  Recent Labs  Lab 09/25/21 1130  LABOPIA NONE DETECTED  COCAINSCRNUR NONE DETECTED  LABBENZ NONE DETECTED  AMPHETMU NONE DETECTED  THCU POSITIVE*  LABBARB NONE DETECTED     Alcohol Level  Recent Labs  Lab 09/25/21 0505  ETH <10     IMAGING past 24 hours DG Abd Portable 1V  Result Date:  09/26/2021 CLINICAL DATA:  OG tube EXAM: PORTABLE ABDOMEN - 1 VIEW COMPARISON:  Abdominal x-ray 09/26/2021 FINDINGS: The tip of the orogastric tube is at the level of the gastric antrum. No dilated bowel loops are seen. Lumbar fusion hardware is partially visualized. IMPRESSION: Orogastric tube tip at the level of the gastric antrum. Electronically Signed   By: Ronney Asters M.D.   On: 09/26/2021 23:18   DG Abd Portable 1V  Result Date: 09/26/2021 CLINICAL DATA:  Check gastric catheter placement EXAM: PORTABLE ABDOMEN - 1 VIEW COMPARISON:  09/25/2021 FINDINGS: Gastric catheter is noted within the stomach. Proximal side  port however lies in the distal esophagus. This should be advanced deeper into the stomach. IMPRESSION: Gastric catheter as described. This should be advanced deeper into the stomach. Electronically Signed   By: Inez Catalina M.D.   On: 09/26/2021 22:03    PHYSICAL EXAM  Physical Exam  Constitutional: Obese caucasian female in no acute distress Cardiovascular: Normal rate and regular rhythm.  Respiratory: Respirations regular and unlabored on supplemental O2  Neuro: Mental Status: Difficult to assess due to aphasia.  Will mimic occasionally. Receptive and expressive aphasia present Cranial Nerves: II: PERRL, blinks to threat bilaterally.  Slight left gaze preference but able to look to the right cross midline Motor/Sensory: Tone is normal. Bulk is normal.  Full movement of left upper and lower extremities with 5/5 strength Withdraws to pain minimally on right upper with 2/5 strength but not lower, 0/5 right lower extremity.  No spontaneous movement or effort against gravity    ASSESSMENT/PLAN Erica Mcdaniel is a 51 y.o. female with history of GERD, hypertension, IBS, polycystic ovarian syndrome, arthritis, morbid obesity presenting with right sided weakness, left gaze deviation, right hemianopsia and expressive aphasia after a fall.  CT showed dense left MCA and she  was taken for emergent mechanical thrombectomy. Remained intubated post procedure. Extubated 8/30. PT/OT/ST pending  Stroke:  Left hemispheric infarct due to proximal left carotid occlusion with embolization to left M1 occlusion and left A2/3 occlusion s/p thrombectomy and placement of rescue proximal left internal carotid stent  Etiology: Proximal left ICA atherosclerosis with thrombosis and distal embolization Code Stroke CT head Dense left MCA consistent with thrombosis. ASPECTS 10.    CTA head & neck Emergent large vessel occlusion at the left ICA origin. Short segment of intracranial ICA reconstitution with subsequent left MCA thrombosis. Atherosclerosis. Suspect moderate narrowing at the right V1 segment. MRI Large evolving acute ischemic left MCA distribution infarct as above. Additional small volume acute ischemic nonhemorrhagic right ACA and/or ACA/MCA watershed distribution infarct. Small area of restricted diffusion involving the right anterior temporal pole. While this finding may reflect an additional site of ischemia, a possible cortical contusion could also be considered given the presumed history of trauma. No associated hemorrhage. MRA  Interval revascularization of previously seen left ICA/MCA occlusion. Focal flow defect at the distal cervical left ICA Carotid Doppler  Left distal ICA stent consistent with 50-75% stenosis. 2D Echo EF 65-70% LDL 137 HgbA1c 5.8 VTE prophylaxis - Lovenox    Diet   Diet NPO time specified   No antithrombotic prior to admission, now on aspirin 81 mg daily and Brilinta (ticagrelor) 90 mg bid.  Therapy recommendations:  Pending Disposition:  pending  S/p Mechanical Thrombectomy with TICI2b of Lt ICA and M1 and TICI 2c revascularization of distal M2 and left A2 Mechanical thrombectomy of the left ICA and Left M1, Superior division of left distal M2 occluded and A2 with delayed occlusion of left carotid, now stented x2 MRI and MRA brain ordered for  today  Acute Respiratory Failure CTA head and neck shows apical pulmonary opacity, favoring edema CCM to manage vent settings and nebs COPD exacerbation? Extubated 8/30  Hypertension Home meds:  hydrochlorothiazide Unstable- cleviprex infusing PRN hydralazine and labetalol  BP goal 120-140 for 24 hours post IR Long-term BP goal normotensive  Hyperlipidemia Home meds:  None LDL 137, goal < 70 Now on Zetia '10mg'$ , Crestor '20mg'$   Other Stroke Risk Factors Cigarette smoker, advised to stop smoking ETOH use, alcohol level <10, advised to drink no  more than 1 drink(s) a day Obesity, Body mass index is 41.57 kg/m., BMI >/= 30 associated with increased stroke risk, recommend weight loss, diet and exercise as appropriate  Obstructive sleep apnea, on CPAP at home  Other Active Problems Chronic lower back pain with right sided sciatica, fibromyalgia Home meds: Tramadol, zanaflex, cyclobenzaprine, celebrex IBS- on bentyl- hold for now GERD- protonix IV  Hospital day # 2  Patient seen and examined by NP/APP with MD. MD to update note as needed.   [place] , MSN, AGACNP-BC [facility] See Amion for schedule and pager information 09/27/2021 10:58 AM   I have personally obtained history,examined this patient, reviewed notes, independently viewed imaging studies, participated in medical decision making and plan of care.ROS completed by me personally and pertinent positives fully documented  I have made any additions or clarifications directly to the above note. Agree with note above.  Continue mobilization out of bed.  Physical, occupational and speech therapy consults.  Change NG tube to Panda tube for tube feeding if unable to swallow.  Continue aspirin and Brilinta for carotid stent.  No family available at the bedside for discussion today.  Transfer to neurology floor bed if available.This patient is critically ill and at significant risk of neurological worsening,  death and care requires constant monitoring of vital signs, hemodynamics,respiratory and cardiac monitoring, extensive review of multiple databases, frequent neurological assessment, discussion with family, other specialists and medical decision making of high complexity.I have made any additions or clarifications directly to the above note.This critical care time does not reflect procedure time, or teaching time or supervisory time of PA/NP/Med Resident etc but could involve care discussion time.  I spent 30 minutes of neurocritical care time  in the care of  this patient.      Antony Contras, MD Medical Director [facility] Pager: [phone] 09/27/2021 1:26 PM    To contact Stroke Continuity provider, please refer to [email or web address] After hours, contact General Neurology

## 2021-09-27 NOTE — Progress Notes (Signed)
NAME:  Erica Mcdaniel, MRN:  [id number], DOB:  September 12, 1970, LOS: 2 ADMISSION DATE:  09/25/2021 CONSULTATION DATE:  09/25/2021 REFERRING MD:  Lorrin Goodell - Neuro, CHIEF COMPLAINT:  L ICA occlusion   History of Present Illness:  51 year old woman who presented to [facility] ED via EMS 8/29 as a Code Stroke. LKN 2130 8/28. Not a TNK candidate. PMHx significant for HTN, tobacco abuse (2 PPD) + THC, GERD, tubular adenoma (colon), IBS, PCOS, skin CA (SCC), fibromyalgia, depression/anxiety.  Patient initially presented to ED as a Code Stroke after being found down at home by son. LKN 2130 8/28. Family heard a loud thud around 0350 8/29 and found patient on the floor. Presented with unilateral R-sided flaccidity and global aphasia. NIHSS 26. Out of the window for TNK. CT Head with dense L MCA c/w thrombosis. CTA Head/Neck demonstrated emergent LVO at the L ICA and subsequent L MCA thrombosis. Patient was taken emergently to [place]. Findings included L ICA and M1/MCA occlusion. Mechanical thrombectomy was performed with MCA TICI 2b recanalization; occlusion of L distal M2/MCA and partial occlusion of distal A2 branches of L ACA noted with TICI 2c recanalization. Stent x 2 to L carotid artery placed. Post-procedure CT negative for bleed. Transferred to 4N.  PCCM consulted for vent/BP management.  Pertinent Medical History:   Past Medical History:  Diagnosis Date   Anxiety    Arthritis    Cancer ([place])    skin squamous cell arm   Depression    Fibromyalgia    GERD (gastroesophageal reflux disease)    Hypertension    IBS (irritable bowel syndrome)    Pneumonia    hx   Polycystic ovarian disease    Tendonitis    Tubular adenoma of colon 2018   multiple   Significant Hospital Events: Including procedures, antibiotic start and stop dates in addition to other pertinent events   8/28 LKN 2130 8/29 Found down ~0350 by son with R-sided deficits, aphasic. Brought to [facility] ED via EMS. Code Stroke called. CT Head  with dense L MCA, c/f LVO. Not a TNK candidate. Taken to [facility] for thrombectomy/recanalization. Post-procedure CT Head negative for bleed. 8/30 Extubated.   Interim History / Subjective:   Remains somnolent, not following commands and not able to use incentive spirometer. Still protecting airway.  Objective:  Blood pressure (!) 139/117, pulse 95, temperature 99.1 F (37.3 C), temperature source Axillary, resp. rate (!) 24, height '5\' 8"'$  (1.727 m), weight 124 kg, SpO2 94 %.    Vent Mode: PSV;CPAP FiO2 (%):  [32 %-40 %] 32 % PEEP:  [8 cmH20] 8 cmH20 Pressure Support:  [8 cmH20] 8 cmH20   Intake/Output Summary (Last 24 hours) at 09/27/2021 0810 Last data filed at 09/27/2021 0600 Gross per 24 hour  Intake 1671.09 ml  Output 1250 ml  Net 421.09 ml    Filed Weights   09/25/21 1045  Weight: 124 kg    Physical Examination: General: obese woman who appears stated age. Somnolent HEENT: [place]/AT, anicteric sclera, PERRL, moist mucous membranes. NGT in place. Neuro: awake, constantly moving left side. Remains densely hemiplegic with right hemineglect  PULM: sonorous breathing from upper airway with clear chest.  GI: Obese, soft, nontender, nondistended. Normoactive bowel sounds. Extremities: Trace symmetric BLE edema noted. Skin: Warm/dry, no rashes.  Ancillary tests personally reviewed:  MRI shows moderately sized right frontal MCA stroke without mass effect.  Artifactual left ICA stenosis on Doppler and MRA due to stent.  Type iii dyslipidemia, mildly elevated HBA1C  Assessment & Plan:  L ICA and M1/MCA occlusion and left A2/3 occlusion s/p thrombectomy and carotid stenting - maintain SBP 120-140, will restart home medications.   Acute hypoxemic respiratory failure, periprocedural on background of OSA, tobacco abuse  No formal diagnosis of COPD - at risk for reintubation due  - continue bronchodilators  - counsel regarding tobacco, cessation - Zyban to prevent relapse post discharge.  Will also help with weight loss. - CPAP while in hospital once more awake.  - would benefit from PFT's but may not be able cooperate due to neurological deficits.    Hypertension Home regimen: HCTZ. - Continue Cleviprex as above, titrated to goal SBP - Hydralazine, Labetalol PRN - Restart home medications   Hyperlipidemia - F/u lipid panel - Start rosuvastatin  GERD IBS - PPI - Hold Bentyl for now - Bowel regimen with PAD protocol  Best Practice: (right click and "Reselect all SmartList Selections" daily)   Diet/type: NPO w/ meds via tube  - swallow evaluation once more awake, Cortrak.  DVT prophylaxis: SCDs Lovenox GI prophylaxis: PPI Lines: N/A Foley:  N/A - will disontinue.  Code Status:  full code Last date of multidisciplinary goals of care discussion [family  updated at bedside and advised regarding extubation, risk of reintubation, expectations for rehabilitation, secondary stroke prevention risk modification. ]   Kipp Brood, MD [facility] 09/27/21 8:10 AM  Please see [email or web address] for pager details.  From 7A-7P if no response, please call [phone] After hours, please call ELink [phone]

## 2021-09-27 NOTE — TOC CAGE-AID Note (Signed)
Transition of Care [facility]) - CAGE-AID Screening   Patient Details  Name: Erica Mcdaniel MRN: [id number] Date of Birth: 16-Jun-1970  Transition of Care [facility]) CM/SW Contact:    Benard Halsted, [place] Phone Number: 09/27/2021, 9:47 AM   Clinical Narrative: Patient is disoriented and unable to participate in screening at this time.    CAGE-AID Screening: Substance Abuse Screening unable to be completed due to: : Patient unable to participate

## 2021-09-27 NOTE — Procedures (Signed)
Cortrak  Tube Type:  Cortrak - 43 inches Tube Location:  Left nare Initial Placement:  Stomach Secured by: Bridle Technique Used to Measure Tube Placement:  Marking at nare/corner of mouth Cortrak Secured At:  70 cm  Cortrak Tube Team Note:  Consult received to place a Cortrak feeding tube.   X-ray is required, abdominal x-ray has been ordered by the Cortrak team. Please confirm tube placement before using the Cortrak tube.   If the tube becomes dislodged please keep the tube and contact the Cortrak team at [email or web address] ([email or web address]) for replacement.  If after hours and replacement cannot be delayed, place a NG tube and confirm placement with an abdominal x-ray.    Marckus Hanover MS, RD, LDN Please refer to AMION for RD and/or RD on-call/weekend/after hours pager   

## 2021-09-27 NOTE — Progress Notes (Signed)
Physical Therapy Treatment Patient Details Name: Erica Mcdaniel MRN: [id number] DOB: 06-Dec-1970 Today's Date: 09/27/2021   History of Present Illness Pt is a 51 y.o. female who presented 09/25/21 with R-sided weakness, aphasia, L gaze deviation, and R hemianopsia. Pt outside window for TNKase. MRI revealed large acute ischemic left MCA distribution infarct, small volume acute ischemic right ACA/MCA watershed distribution infarct, and small soft tissue contusion at the right frontotemporal scalp. S/p cerebral angiogram 8/29. ETT 8/29-8/30. PMH: GERD, HTN, IBS, polycistic ovarian syndrome, arthritis, morbid obesity, cancer, fibromyalgia, anxiety    PT Comments    Pt is beginning to track more towards midline, but continues to display a L gaze preference and a tendency to push herself to her R. Attempted to provide visual feedback to promote awareness and midline alignment, but pt did not attend to self in the mirror despite max cues. Pt remains non-verbal and only followed a few simple cues with her L UE today. She was able to progress to rolling to the R with maxAx2 with multi-modal cues to guide her, but she was limited in further mobility progression by lethargy. Will continue to follow acutely. Current recommendations remain appropriate provided her ability to participate improves.     Recommendations for follow up therapy are one component of a multi-disciplinary discharge planning process, led by the attending physician.  Recommendations may be updated based on patient status, additional functional criteria and insurance authorization.  Follow Up Recommendations  Acute inpatient rehab (3hours/day)     Assistance Recommended at Discharge Frequent or constant Supervision/Assistance  Patient can return home with the following Two people to help with walking and/or transfers;Two people to help with bathing/dressing/bathroom;Assistance with cooking/housework;Assistance with feeding;Direct  supervision/assist for medications management;Direct supervision/assist for financial management;Assist for transportation;Help with stairs or ramp for entrance   Equipment Recommendations  Rolling walker (2 wheels);BSC/3in1;Wheelchair (measurements PT);Wheelchair cushion (measurements PT);Hospital bed;Other (comment) (hoyer equipment; pending progress)    Recommendations for Other Services       Precautions / Restrictions Precautions Precautions: Fall;Other (comment) Precaution Comments: watch SpO2; pushes to R; R leg extensor tone Restrictions Weight Bearing Restrictions: No     Mobility  Bed Mobility Overal bed mobility: Needs Assistance Bed Mobility: Rolling, Supine to Sit Rolling: +2 for physical assistance, +2 for safety/equipment, Max assist, Total assist   Supine to sit: Total assist, +2 for physical assistance, +2 for safety/equipment, HOB elevated     General bed mobility comments: Pt did flex L knee and begin to pull with L hand when provided verbal and tactile cues to do so to roll to R, maxAx2. TAx2 to roll to L though. TAx2 to pull trunk off elevated HOB with bed in egress position, trying to cue pt to pull with UEs on therapist or rails but no success    Transfers                   General transfer comment: deferred for safety due to poor command following and lethargy    Ambulation/Gait               General Gait Details: deferred for safety due to poor command following and lethargy   Stairs             Wheelchair Mobility    Modified Rankin (Stroke Patients Only) Modified Rankin (Stroke Patients Only) Pre-Morbid Rankin Score: No symptoms Modified Rankin: Severe disability     Balance Overall balance assessment: Needs assistance Sitting-balance support: Single extremity supported, No  upper extremity supported, Feet unsupported Sitting balance-Leahy Scale: Zero Sitting balance - Comments: TA to sit up with trunk unsupported from  bed in egress position. Pt pushing self to R often, despite attempts to get pt to attend to self in mirror and correct. Postural control: Right lateral lean, Posterior lean     Standing balance comment: deferred for safety due to poor command following and lethargy                            Cognition Arousal/Alertness: Lethargic Behavior During Therapy: Flat affect Overall Cognitive Status: Impaired/Different from baseline Area of Impairment: Attention, Following commands, Safety/judgement, Awareness, Problem solving                   Current Attention Level: Focused   Following Commands: Follows one step commands inconsistently, Follows one step commands with increased time Safety/Judgement: Decreased awareness of safety, Decreased awareness of deficits Awareness: Intellectual Problem Solving: Slow processing, Decreased initiation, Difficulty sequencing, Requires verbal cues, Requires tactile cues General Comments: Pt maintaining a flat effect throughout and not verbalizing at all during session. Pt following only a few simple, multi-modal commands to lift L arm and open eyes when stimulated. Pt closing eyes and falling asleep often. Pt is beginning to track more to her midline though. Pt pushes self to her R with her L UE, indicating poor awareness of her deficits and safety. Did not benefit from use of long mirror today.        Exercises      General Comments General comments (skin integrity, edema, etc.): VSS on supplemental O2 via       Pertinent Vitals/Pain Pain Assessment Pain Assessment: Faces Faces Pain Scale: Hurts a little bit Pain Location: generalized Pain Descriptors / Indicators: Grimacing Pain Intervention(s): Limited activity within patient's tolerance, Monitored during session, Repositioned    Home Living                          Prior Function            PT Goals (current goals can now be found in the care plan section)  Acute Rehab PT Goals Patient Stated Goal: did not state PT Goal Formulation: With patient Time For Goal Achievement: 10/11/21 Potential to Achieve Goals: Fair Progress towards PT goals: Progressing toward goals    Frequency    Min 4X/week      PT Plan Current plan remains appropriate    Co-evaluation              AM-PAC PT "6 Clicks" Mobility   Outcome Measure  Help needed turning from your back to your side while in a flat bed without using bedrails?: Total Help needed moving from lying on your back to sitting on the side of a flat bed without using bedrails?: Total Help needed moving to and from a bed to a chair (including a wheelchair)?: Total Help needed standing up from a chair using your arms (e.g., wheelchair or bedside chair)?: Total Help needed to walk in hospital room?: Total Help needed climbing 3-5 steps with a railing? : Total 6 Click Score: 6    End of Session Equipment Utilized During Treatment: Oxygen Activity Tolerance: Patient limited by lethargy Patient left: in bed;with call bell/phone within reach;with bed alarm set   PT Visit Diagnosis: Difficulty in walking, not elsewhere classified (R26.2);Unsteadiness on feet (R26.81);Muscle weakness (generalized) (M62.81);Other symptoms and  signs involving the nervous system (R29.898);Hemiplegia and hemiparesis Hemiplegia - Right/Left: Right Hemiplegia - dominant/non-dominant: Dominant Hemiplegia - caused by: Cerebral infarction     Time: 1452-1520 PT Time Calculation (min) (ACUTE ONLY): 28 min  Charges:  $Therapeutic Activity: 23-37 mins                     Moishe Spice, PT, DPT Acute Rehabilitation Services  Office: [place] 09/27/2021, 3:44 PM

## 2021-09-27 NOTE — Progress Notes (Addendum)
Referring Physician(s): Donnetta Simpers  Supervising Physician: Pedro Earls   Patient Status:  [facility] - In-pt  Chief Complaint:  Code stroke  Brief History:  Erica Mcdaniel is a 51 y.o. female with medical issues including GERD, hypertension, IBS, polycystic ovarian syndrome, arthritis, longstanding 2 ppd smoking history and morbid obesity.  Her son found her on the floor around 0350 on 09/25/2021. She was weak on her right side and not talking.   EMS noted her to have right-sided weakness, aphasia, left gaze deviation and right hemianopsia and was brought in as a code stroke, she is s/p Lt ACA, ICA, MCA thrombectomy all achieving TICI 2 and left carotid stent placement   by Dr. Gerhard Perches, Dr. Karenann Cai, and Dr. Estanislado Pandy on 8/29   Subjective: Patient extubated yesterday, she is not on sedation currently.  She is laying in bed sleeping, NAD.  RN reports minimal, non purposeful movent on right arm noted. Not able to follow commands.    Allergies: Clarithromycin  Medications: Prior to Admission medications   Medication Sig Start Date End Date Taking? Authorizing Provider  busPIRone (BUSPAR) 15 MG tablet Take 15 mg by mouth 2 (two) times daily as needed for anxiety. 08/06/21  Yes [provider]  celecoxib (CELEBREX) 200 MG capsule Take 200 mg by mouth 2 (two) times daily. 09/16/21  Yes [provider]  cetirizine (ZYRTEC) 10 MG tablet Take 10 mg by mouth daily.   Yes [provider]  Cyanocobalamin (VITAMIN B12 PO) Take 1 tablet by mouth daily.   Yes [provider]  cyclobenzaprine (FLEXERIL) 10 MG tablet Take 1 tablet (10 mg total) by mouth 3 (three) times daily as needed for muscle spasms. Patient taking differently: Take 10 mg by mouth daily as needed for muscle spasms. 08/08/21  Yes Persons, Bevely Palmer, PA  dicyclomine (BENTYL) 10 MG capsule Take 10 mg by mouth 2 (two) times daily.   Yes [provider]  DULoxetine (CYMBALTA) 60 MG capsule Take 120 mg by mouth daily.   Yes [provider]  esomeprazole (NEXIUM) 40 MG capsule Take 1 capsule (40 mg total) by mouth 2 (two) times daily before a meal. Patient taking differently: Take 40 mg by mouth daily. 04/15/16  Yes Pyrtle, Lajuan Lines, MD  ezetimibe (ZETIA) 10 MG tablet Take 10 mg by mouth daily. 05/10/21  Yes [provider]  hydrochlorothiazide (HYDRODIURIL) 25 MG tablet Take 25 mg by mouth daily.   Yes [provider]  lisinopril (ZESTRIL) 10 MG tablet Take 10 mg by mouth daily. 05/20/21  Yes [provider]  Multiple Vitamins-Minerals (MULTIVITAMIN WITH MINERALS) tablet Take 1 tablet by mouth daily.   Yes [provider]  simvastatin (ZOCOR) 10 MG tablet Take by mouth. 05/10/21  Yes [provider]  traMADol (ULTRAM) 50 MG tablet Take 1 tablet (50 mg total) by mouth 3 (three) times daily as needed (1-2 tabs PO TID PRN). 11/10/17  Yes Garald Balding, MD  Vitamin D, Ergocalciferol, (DRISDOL) 50000 UNITS CAPS Take 50,000 Units by mouth every 7 (seven) days. Once a week   Yes [provider]     Vital Signs: BP (!) 153/57 (BP Location: Right Arm)   Pulse 99   Temp 98.3 F (36.8 C) (Axillary)   Resp (!) 23   Ht '5\' 8"'  (1.727 m)   Wt 273 lb 5.9 oz (124 kg)   SpO2 92%   BMI 41.57 kg/m   Physical Exam  Vitals reviewed.  Constitutional:      Comments: Opens eyes to voice but not following commands  HENT:     Nose:     Comments: NGT Cardiovascular:     Rate and Rhythm: Normal rate.  Pulmonary:     Effort: Pulmonary effort is normal.     Comments: Intubated/vent Musculoskeletal:     Comments: Bilateral CFA stick sites are ok, no bleeding/hematoma/psa  Skin:    General: Skin is warm and dry.     Comments: Both R CFA and L CFA puncture sites c/d/I and tender, no appreciable pseudoaneurysm. No dressing noted.   DP 2+ bilaterally   Neurological:     Comments: Opens eyes when  called by first name.  Global aphasia  Not able to follow commands. No facial asymmetry  Spontaneous movement on LT UE/LE, 4/5 Spontaneous movement on Rt LE, 2/5  Minimal movement on Rt UE, 1/5     Imaging: DG Abd Portable 1V  Result Date: 09/27/2021 CLINICAL DATA:  Evaluate feeding tube placement. EXAM: PORTABLE ABDOMEN - 1 VIEW COMPARISON:  09/26/2021 FINDINGS: A feeding tube is identified with tip below the GE junction projecting over the expected location of the gastric antrum. No dilated bowel loops identified. Visualized lung bases appear clear. IMPRESSION: Feeding tube tip projects over the gastric antrum. Electronically Signed   By: Kerby Moors M.D.   On: 09/27/2021 11:13   DG Abd Portable 1V  Result Date: 09/26/2021 CLINICAL DATA:  OG tube EXAM: PORTABLE ABDOMEN - 1 VIEW COMPARISON:  Abdominal x-ray 09/26/2021 FINDINGS: The tip of the orogastric tube is at the level of the gastric antrum. No dilated bowel loops are seen. Lumbar fusion hardware is partially visualized. IMPRESSION: Orogastric tube tip at the level of the gastric antrum. Electronically Signed   By: Ronney Asters M.D.   On: 09/26/2021 23:18   DG Abd Portable 1V  Result Date: 09/26/2021 CLINICAL DATA:  Check gastric catheter placement EXAM: PORTABLE ABDOMEN - 1 VIEW COMPARISON:  09/25/2021 FINDINGS: Gastric catheter is noted within the stomach. Proximal side port however lies in the distal esophagus. This should be advanced deeper into the stomach. IMPRESSION: Gastric catheter as described. This should be advanced deeper into the stomach. Electronically Signed   By: Inez Catalina M.D.   On: 09/26/2021 22:03   MR BRAIN WO CONTRAST  Result Date: 09/26/2021 CLINICAL DATA:  Follow-up examination for acute stroke, left ICA occlusion, status post mechanical thrombectomy. EXAM: MRI HEAD WITHOUT CONTRAST MRA HEAD WITHOUT CONTRAST TECHNIQUE: Multiplanar, multi-echo pulse sequences of the brain and surrounding structures were  acquired without intravenous contrast. Angiographic images of the Circle of Willis were acquired using MRA technique without intravenous contrast. COMPARISON:  Prior studies from 09/25/2021. FINDINGS: MRI HEAD FINDINGS Brain: Cerebral volume within normal limits. No significant cerebral white matter disease for age. Fairly sizable area of restricted diffusion involving the left frontal, parietal, and occipital lobes, consistent with acute left MCA distribution infarct. Involvement is most pronounced about the cortical gray matter, although fairly confluent involvement of the left caudate and lentiform nuclei noted as well. Additionally, patchy small volume acute right ACA and/or ACA/MCA watershed distribution infarcts seen within the contralateral right frontal lobe (series 5, image 95). Additional area of restricted diffusion involving the anterior right temporal pole noted as well (series 5, image 68). While this could reflect an additional site of ischemia, possible cortical contusion could also be considered. No associated hemorrhage or significant regional mass effect about these infarcts. Otherwise,  gray-white matter differentiation maintained. No areas of underlying chronic cortical infarction. No convincing or visible acute or chronic intracranial blood products. No mass lesion or midline shift. Ventricles normal size without hydrocephalus. No convincing extra-axial collection. Note made of a somewhat linear density overlying the right parietal convexity on coronal DWI sequence (series 7, image 47), favored to correspond with a prominent cortical vein. Pituitary gland and suprasellar region within normal limits. Vascular: Major intracranial vascular flow voids are maintained. Skull and upper cervical spine: Craniocervical junction within normal limits. Bone marrow signal intensity somewhat diffusely decreased on T1 weighted imaging, nonspecific, but most commonly related to anemia, smoking, or obesity. No  focal marrow replacing lesion. Probable small soft tissue contusion at the right frontotemporal scalp (series 5, image 86). Sinuses/Orbits: Globes orbital soft tissues within normal limits. Moderate mucosal thickening present throughout the paranasal sinuses. Small bilateral mastoid effusions noted. Patient is intubated. Other: None. MRA HEAD FINDINGS Anterior circulation: Visualized distal cervical segments of the internal carotid arteries are patent with antegrade flow. Focal flow defect at the distal cervical left ICA noted (series 1, image 48). Finding could be artifactual as no discrete stenosis is seen at this location on prior arteriogram. Possible focus of residual and/or recurrent thrombus with associated moderate approximate 50% stenosis is difficult to exclude. Petrous segments patent bilaterally. Right ICA widely patent through the siphon without stenosis or other abnormality. Asymmetric irregularity seen throughout the left carotid siphon, most pronounced on 3D time-of-flight sequence (series 1, image 86). While this finding could be atherosclerotic in nature, a degree of vasospasm could be considered given the recent intervention. No high-grade stenosis. A1 segments patent bilaterally. Normal anterior communicating artery complex. Both ACAs are widely patent to their distal aspects. M1 segments are now both patent bilaterally. No proximal MCA branch occlusion. Distal MCA branches well perfused bilaterally. Increased prominence of the distal left MCA branches as compared to the right, likely reflecting a degree of luxury reperfusion given the underlying ischemic changes and revascularization. Posterior circulation: Left vertebral artery strongly dominant and widely patent to the vertebrobasilar junction. Left PICA patent. Right vertebral artery markedly hypoplastic but patent without visible stenosis. Partially visualized right PICA patent as well. Basilar patent to its distal aspect without stenosis.  Superior cerebral arteries patent bilaterally. Both PCAs primarily supplied via the basilar. PCAs widely patent to their distal aspects without proximal stenosis. Anatomic variants: As above.  No intracranial aneurysm. IMPRESSION: MRI HEAD IMPRESSION: 1. Large evolving acute ischemic left MCA distribution infarct as above. No associated hemorrhage or significant regional mass effect. 2. Additional small volume acute ischemic nonhemorrhagic right ACA and/or ACA/MCA watershed distribution infarct. 3. Small area of restricted diffusion involving the right anterior temporal pole. While this finding may reflect an additional site of ischemia, a possible cortical contusion could also be considered given the presumed history of trauma. No associated hemorrhage. 4. Small soft tissue contusion at the right frontotemporal scalp. MRA HEAD IMPRESSION: 1. Interval revascularization of previously seen left ICA/MCA occlusion. Asymmetric prominence of distal left MCA branches, consistent with a degree of luxury perfusion status post revascularization. 2. Asymmetric irregularity about the left carotid siphon, indeterminate. While this finding could be atherosclerotic in nature, a degree of vasospasm could also be present given the recent intervention. 3. Focal flow defect at the distal cervical left ICA as above. While this finding could be artifactual in nature as no discrete stenosis is seen at this location on prior arteriogram, a possible focus of residual and/or recurrent partially occlusive  thrombus is difficult to exclude. Associated moderate approximate 50% stenosis at this level. 4. Otherwise wide patency of the major intracranial arterial vasculature. Electronically Signed   By: Jeannine Boga M.D.   On: 09/26/2021 03:12   MR ANGIO HEAD WO CONTRAST  Result Date: 09/26/2021 CLINICAL DATA:  Follow-up examination for acute stroke, left ICA occlusion, status post mechanical thrombectomy. EXAM: MRI HEAD WITHOUT  CONTRAST MRA HEAD WITHOUT CONTRAST TECHNIQUE: Multiplanar, multi-echo pulse sequences of the brain and surrounding structures were acquired without intravenous contrast. Angiographic images of the Circle of Willis were acquired using MRA technique without intravenous contrast. COMPARISON:  Prior studies from 09/25/2021. FINDINGS: MRI HEAD FINDINGS Brain: Cerebral volume within normal limits. No significant cerebral white matter disease for age. Fairly sizable area of restricted diffusion involving the left frontal, parietal, and occipital lobes, consistent with acute left MCA distribution infarct. Involvement is most pronounced about the cortical gray matter, although fairly confluent involvement of the left caudate and lentiform nuclei noted as well. Additionally, patchy small volume acute right ACA and/or ACA/MCA watershed distribution infarcts seen within the contralateral right frontal lobe (series 5, image 95). Additional area of restricted diffusion involving the anterior right temporal pole noted as well (series 5, image 68). While this could reflect an additional site of ischemia, possible cortical contusion could also be considered. No associated hemorrhage or significant regional mass effect about these infarcts. Otherwise, gray-white matter differentiation maintained. No areas of underlying chronic cortical infarction. No convincing or visible acute or chronic intracranial blood products. No mass lesion or midline shift. Ventricles normal size without hydrocephalus. No convincing extra-axial collection. Note made of a somewhat linear density overlying the right parietal convexity on coronal DWI sequence (series 7, image 47), favored to correspond with a prominent cortical vein. Pituitary gland and suprasellar region within normal limits. Vascular: Major intracranial vascular flow voids are maintained. Skull and upper cervical spine: Craniocervical junction within normal limits. Bone marrow signal  intensity somewhat diffusely decreased on T1 weighted imaging, nonspecific, but most commonly related to anemia, smoking, or obesity. No focal marrow replacing lesion. Probable small soft tissue contusion at the right frontotemporal scalp (series 5, image 86). Sinuses/Orbits: Globes orbital soft tissues within normal limits. Moderate mucosal thickening present throughout the paranasal sinuses. Small bilateral mastoid effusions noted. Patient is intubated. Other: None. MRA HEAD FINDINGS Anterior circulation: Visualized distal cervical segments of the internal carotid arteries are patent with antegrade flow. Focal flow defect at the distal cervical left ICA noted (series 1, image 48). Finding could be artifactual as no discrete stenosis is seen at this location on prior arteriogram. Possible focus of residual and/or recurrent thrombus with associated moderate approximate 50% stenosis is difficult to exclude. Petrous segments patent bilaterally. Right ICA widely patent through the siphon without stenosis or other abnormality. Asymmetric irregularity seen throughout the left carotid siphon, most pronounced on 3D time-of-flight sequence (series 1, image 86). While this finding could be atherosclerotic in nature, a degree of vasospasm could be considered given the recent intervention. No high-grade stenosis. A1 segments patent bilaterally. Normal anterior communicating artery complex. Both ACAs are widely patent to their distal aspects. M1 segments are now both patent bilaterally. No proximal MCA branch occlusion. Distal MCA branches well perfused bilaterally. Increased prominence of the distal left MCA branches as compared to the right, likely reflecting a degree of luxury reperfusion given the underlying ischemic changes and revascularization. Posterior circulation: Left vertebral artery strongly dominant and widely patent to the vertebrobasilar junction. Left PICA patent.  Right vertebral artery markedly hypoplastic but  patent without visible stenosis. Partially visualized right PICA patent as well. Basilar patent to its distal aspect without stenosis. Superior cerebral arteries patent bilaterally. Both PCAs primarily supplied via the basilar. PCAs widely patent to their distal aspects without proximal stenosis. Anatomic variants: As above.  No intracranial aneurysm. IMPRESSION: MRI HEAD IMPRESSION: 1. Large evolving acute ischemic left MCA distribution infarct as above. No associated hemorrhage or significant regional mass effect. 2. Additional small volume acute ischemic nonhemorrhagic right ACA and/or ACA/MCA watershed distribution infarct. 3. Small area of restricted diffusion involving the right anterior temporal pole. While this finding may reflect an additional site of ischemia, a possible cortical contusion could also be considered given the presumed history of trauma. No associated hemorrhage. 4. Small soft tissue contusion at the right frontotemporal scalp. MRA HEAD IMPRESSION: 1. Interval revascularization of previously seen left ICA/MCA occlusion. Asymmetric prominence of distal left MCA branches, consistent with a degree of luxury perfusion status post revascularization. 2. Asymmetric irregularity about the left carotid siphon, indeterminate. While this finding could be atherosclerotic in nature, a degree of vasospasm could also be present given the recent intervention. 3. Focal flow defect at the distal cervical left ICA as above. While this finding could be artifactual in nature as no discrete stenosis is seen at this location on prior arteriogram, a possible focus of residual and/or recurrent partially occlusive thrombus is difficult to exclude. Associated moderate approximate 50% stenosis at this level. 4. Otherwise wide patency of the major intracranial arterial vasculature. Electronically Signed   By: Jeannine Boga M.D.   On: 09/26/2021 03:12   ECHOCARDIOGRAM COMPLETE  Result Date: 09/25/2021     ECHOCARDIOGRAM REPORT   Patient Name:   Erica Mcdaniel Date of Exam: 09/25/2021 Medical Rec #:  [id number]           Height:       68.0 in Accession #:    [id number]          Weight:       273.4 lb Date of Birth:  Feb 24, 1970           BSA:          2.334 m Patient Age:    52 years            BP:           122/6 mmHg Patient Gender: F                   HR:           70 bpm. Exam Location:  Inpatient Procedure: 2D Echo, Cardiac Doppler and Color Doppler Indications:    Stroke  History:        Patient has no prior history of Echocardiogram examinations.                 Risk Factors:Hypertension and Current Smoker. GERD.  Sonographer:    Clayton Lefort RDCS (AE) Referring Phys: [id number] [facility]  Sonographer Comments: Patient is obese and echo performed with patient supine and on artificial respirator. Image acquisition challenging due to patient body habitus. IMPRESSIONS  1. Left ventricular ejection fraction, by estimation, is 65 to 70%. The left ventricle has normal function. The left ventricle has no regional wall motion abnormalities. There is mild left ventricular hypertrophy. Left ventricular diastolic parameters were normal.  2. Right ventricular systolic function is hyperdynamic. The right ventricular size is normal. Tricuspid regurgitation signal is inadequate for assessing  PA pressure.  3. The mitral valve is normal in structure. No evidence of mitral valve regurgitation.  4. The aortic valve was not well visualized. There is mild calcification of the aortic valve. Aortic valve regurgitation is not visualized. Aortic valve sclerosis/calcification is present, without any evidence of aortic stenosis.  5. The inferior vena cava is normal in size with greater than 50% respiratory variability, suggesting right atrial pressure of 3 mmHg. Comparison(s): No prior Echocardiogram. FINDINGS  Left Ventricle: Left ventricular ejection fraction, by estimation, is 65 to 70%. The left ventricle has normal function.  The left ventricle has no regional wall motion abnormalities. The left ventricular internal cavity size was normal in size. There is  mild left ventricular hypertrophy. Left ventricular diastolic parameters were normal. Right Ventricle: The right ventricular size is normal. No increase in right ventricular wall thickness. Right ventricular systolic function is hyperdynamic. Tricuspid regurgitation signal is inadequate for assessing PA pressure. Left Atrium: Left atrial size was normal in size. Right Atrium: Right atrial size was normal in size. Pericardium: There is no evidence of pericardial effusion. Mitral Valve: The mitral valve is normal in structure. No evidence of mitral valve regurgitation. MV peak gradient, 6.2 mmHg. The mean mitral valve gradient is 3.0 mmHg. Tricuspid Valve: The tricuspid valve is normal in structure. Tricuspid valve regurgitation is not demonstrated. No evidence of tricuspid stenosis. Aortic Valve: The aortic valve was not well visualized. There is mild calcification of the aortic valve. Aortic valve regurgitation is not visualized. Aortic valve sclerosis/calcification is present, without any evidence of aortic stenosis. Aortic valve mean gradient measures 9.0 mmHg. Aortic valve peak gradient measures 16.5 mmHg. Aortic valve area, by VTI measures 2.33 cm. Pulmonic Valve: The pulmonic valve was normal in structure. Pulmonic valve regurgitation is not visualized. No evidence of pulmonic stenosis. Aorta: The aortic root, ascending aorta and aortic arch are all structurally normal, with no evidence of dilitation or obstruction. Venous: The inferior vena cava is normal in size with greater than 50% respiratory variability, suggesting right atrial pressure of 3 mmHg. IAS/Shunts: No atrial level shunt detected by color flow Doppler.  LEFT VENTRICLE PLAX 2D LVIDd:         4.50 cm   Diastology LVIDs:         3.00 cm   LV e' medial:    9.90 cm/s LV PW:         1.20 cm   LV E/e' medial:  11.8 LV  IVS:        0.80 cm   LV e' lateral:   9.90 cm/s LVOT diam:     2.00 cm   LV E/e' lateral: 11.8 LV SV:         88 LV SV Index:   38 LVOT Area:     3.14 cm  RIGHT VENTRICLE             IVC RV Basal diam:  3.00 cm     IVC diam: 1.90 cm RV S prime:     18.10 cm/s TAPSE (M-mode): 3.0 cm LEFT ATRIUM           Index        RIGHT ATRIUM           Index LA diam:      3.00 cm 1.29 cm/m   RA Area:     11.70 cm LA Vol (A2C): 31.4 ml 13.45 ml/m  RA Volume:   25.20 ml  10.80 ml/m LA Vol (A4C): 55.2  ml 23.65 ml/m  AORTIC VALVE AV Area (Vmax):    2.27 cm AV Area (Vmean):   2.11 cm AV Area (VTI):     2.33 cm AV Vmax:           203.00 cm/s AV Vmean:          140.000 cm/s AV VTI:            0.379 m AV Peak Grad:      16.5 mmHg AV Mean Grad:      9.0 mmHg LVOT Vmax:         147.00 cm/s LVOT Vmean:        94.200 cm/s LVOT VTI:          0.281 m LVOT/AV VTI ratio: 0.74  AORTA Ao Root diam: 2.90 cm Ao Asc diam:  3.00 cm MITRAL VALVE MV Area (PHT): 2.91 cm     SHUNTS MV Area VTI:   2.51 cm     Systemic VTI:  0.28 m MV Peak grad:  6.2 mmHg     Systemic Diam: 2.00 cm MV Mean grad:  3.0 mmHg MV Vmax:       1.25 m/s MV Vmean:      88.6 cm/s MV Decel Time: 261 msec MV E velocity: 117.00 cm/s MV A velocity: 115.00 cm/s MV E/A ratio:  1.02 Rudean Haskell MD Electronically signed by Rudean Haskell MD Signature Date/Time: 09/25/2021/6:15:04 PM    Final    VAS US CAROTID  Result Date: 09/25/2021 Carotid Arterial Duplex Study Patient Name:  Erica Mcdaniel  Date of Exam:   09/25/2021 Medical Rec #: [id number]            Accession #:    [id number] Date of Birth: 1970/08/03            Patient Gender: F Patient Age:   51 years Exam Location:  [facility] Procedure:      VAS US CAROTID Referring Phys: Janine Ores --------------------------------------------------------------------------------  Indications:       Left stent and Carotid stenosis. Risk Factors:      Hypertension. Other Factors:     Lt stent done 09/25/21.  Limitations        Today's exam was limited due to patient on a ventilator and                    patient positioning. Comparison Study:  no prior Performing Technologist: Archie Patten RVS  Examination Guidelines: A complete evaluation includes B-mode imaging, spectral Doppler, color Doppler, and power Doppler as needed of all accessible portions of each vessel. Bilateral testing is considered an integral part of a complete examination. Limited examinations for reoccurring indications may be performed as noted.  Right Carotid Findings: +----------+--------+--------+--------+------------------+--------+           PSV cm/sEDV cm/sStenosisPlaque DescriptionComments +----------+--------+--------+--------+------------------+--------+ CCA Prox  102     26              heterogenous               +----------+--------+--------+--------+------------------+--------+ CCA Distal71      22              heterogenous               +----------+--------+--------+--------+------------------+--------+ ICA Prox  85      27      1-39%   heterogenous               +----------+--------+--------+--------+------------------+--------+ ICA Distal98  38                                         +----------+--------+--------+--------+------------------+--------+ ECA       107     13                                         +----------+--------+--------+--------+------------------+--------+ +----------+--------+-------+--------+-------------------+           PSV cm/sEDV cmsDescribeArm Pressure (mmHG) +----------+--------+-------+--------+-------------------+ [id number]                                         +----------+--------+-------+--------+-------------------+ +---------+--------+--+--------+-+ VertebralPSV cm/s35EDV cm/s7 +---------+--------+--+--------+-+  Left Carotid Findings: +----------+--------+--------+--------+------------------+--------+           PSV cm/sEDV  cm/sStenosisPlaque DescriptionComments +----------+--------+--------+--------+------------------+--------+ CCA Prox  84      17              heterogenous               +----------+--------+--------+--------+------------------+--------+ CCA Distal86      24              heterogenous               +----------+--------+--------+--------+------------------+--------+ ICA Distal174     54                                         +----------+--------+--------+--------+------------------+--------+ ECA       107     9                                          +----------+--------+--------+--------+------------------+--------+ +----------+--------+--------+--------+-------------------+           PSV cm/sEDV cm/sDescribeArm Pressure (mmHG) +----------+--------+--------+--------+-------------------+ Subclavian118                                         +----------+--------+--------+--------+-------------------+ +---------+--------+--+--------+--+---------+ VertebralPSV cm/s42EDV cm/s11Antegrade +---------+--------+--+--------+--+---------+  Left Stent(s): +---------------+---+--+---------------+++ Prox to Stent  81 21                +---------------+---+--+---------------+++ Proximal Stent 78 26                +---------------+---+--+---------------+++ Mid Stent      63 26                +---------------+---+--+---------------+++ Distal Stent   [id number]% stenosis +---------------+---+--+---------------+++ Distal to [id number]                +---------------+---+--+---------------+++    Summary: Right Carotid: Velocities in the right ICA are consistent with a 1-39% stenosis. Left Carotid: Left distal ICA stent consistent with 50-75% stenosis. Vertebrals: Bilateral vertebral arteries demonstrate antegrade flow. *See table(s) above for measurements and observations.  Electronically signed by Antony Contras MD on 09/25/2021 at 4:36:13 PM.    Final     IR US Guide Vasc Access Left  Result Date: 09/25/2021 INDICATION: 51 year old female with past medical history significant  for GERD, hypertension, IBS, polycystic ovarian syndrome, arthritis, morbid obesity was last seen normal by her son at 2000 hours on 09/24/2021. Son reports that heard a loud third in the morning around 3:50 a.m. and went up and found the patient on the floor, she was weak in her right side and was aphasic. The patient was brought to the ER by EMS. Last known well: 2000 hours on 09/24/2021 Her NIHSS: 25. MRSA: EXAM: ULTRASOUND-GUIDED VASCULAR ACCESS DIAGNOSTIC CEREBRAL ANGIOGRAM MECHANICAL THROMBECTOMY OF THE LEFT ICA, LEFT M1/MCA, DISTAL M2/MCA OF THE SUPERIOR DIVISION, A3 SEGMENT OF LEFT ACA CAROTID STENT PLACEMENT FOR OCCLUSION OF THE LEFT CAROTID ARTERY FLAT PANEL HEAD CT X2 COMPARISON:  None Available. MEDICATIONS: Verapamil a total of 10 mg at 5 mg, and 2.5 mg increments Cangrelor 15 microgram/kg (patient weighs 114 kg) Aspirin 81 mg and Brilinta 180 mg through the orogastric tube Integrilin 3 mg ANESTHESIA/SEDATION: General anesthesia CONTRAST:  240 mL of Omnipaque 300 FLUOROSCOPY TIME:  Radiation Exposure Index (as provided by the fluoroscopic device): 4156 mGy Kerma Fluoro time: 98 minute and 12 seconds COMPLICATIONS: None immediate. TECHNIQUE: Following a full explanation of the procedure along with the potential associated complications, an informed witnessed consent was obtained. The risks of intracranial hemorrhage of 10%, worsening neurological deficit, ventilator dependency, death and inability to revascularize were all reviewed in detail with the patient's son and family. The patient was then put under general anesthesia by the Department of Anesthesiology at [facility]. The left groin was prepped and draped in the usual sterile fashion. Ultrasound examination of the left groin was performed demonstrating widely patent left common femoral artery. Images were  recorded and sent to PACs. Under ultrasound guidance and by using a micropuncture cyst, left common femoral artery was accessed 018 inch wire was advanced through the needle and the needle was exchanged for a 4 French microcatheter. Then, inner dilator was removed, 035 inch wire was introduced and 5 [place] sheath was placed. FINDINGS: Left common femoral artery is widely patent. Patent common femoral artery was recorded and sent to the PACS. PROCEDURE: Ultrasound-guided access into the left common femoral artery. IMPRESSION: Ultrasound-guided access into the left common femoral artery. Please refer to the detailed report in the previous dictation. PLAN: 1. Transfer to ICU. 2. Tab. Aspirin 81 mg and Tab. Brilinta 90 mg bid 3. BP goals of 120-140 mm Hg Electronically Signed   By: Frazier Richards M.D.   On: 09/25/2021 14:34   IR PERCUTANEOUS ART THROMBECTOMY/INFUSION INTRACRANIAL INC DIAG ANGIO  Result Date: 09/25/2021 INDICATION: 51 year old female with past medical history significant for GERD, hypertension, IBS, polycystic ovarian syndrome, arthritis, morbid obesity was last seen normal by her son at 2000 hours on 09/24/2021. Son reports that heard a loud third in the morning around 3:50 a.m. and went up and found the patient on the floor, she was weak in her right side and was aphasic. The patient was brought to the ER by EMS. Last known well: 2000 hours on 09/24/2021 Her NIHSS: 25. MRSA: 0 EXAM: ULTRASOUND-GUIDED VASCULAR ACCESS DIAGNOSTIC CEREBRAL ANGIOGRAM MECHANICAL THROMBECTOMY OF THE LEFT ICA, LEFT M1/MCA, DISTAL M2/MCA OF THE SUPERIOR DIVISION, A3 SEGMENT OF LEFT ACA CAROTID STENT PLACEMENT FOR OCCLUSION OF THE LEFT CAROTID ARTERY FLAT PANEL HEAD CT X2 COMPARISON:  None Available. MEDICATIONS: Verapamil a total of 10 mg at 5 mg, and 2.5 mg increments Cangrelor 15 microgram/kg (patient weighs 114 kg) Aspirin 81 mg and  Brilinta 180 mg through the orogastric tube Integrilin  3 mg Performing surgeon: Dr. Frazier Richards Assistants: Dr. Karenann Cai and Dr. Luanne Bras ANESTHESIA/SEDATION: The procedure was performed under general anesthesia. CONTRAST:  240 mL of Omnipaque 300 milligram/mL FLUOROSCOPY: Radiation Exposure Index (as provided by the fluoroscopic device): 4156 mGy Kerma Fluoro time: 98 minute and 12 seconds COMPLICATIONS: None immediate. TECHNIQUE: Informed written consent was obtained from the patient's son after a thorough discussion of the procedural risks, benefits and alternatives. All questions were addressed. Maximal Sterile Barrier Technique was utilized including caps, mask, sterile gowns, sterile gloves, sterile drape, hand hygiene and skin antiseptic. A timeout was performed prior to the initiation of the procedure. The right groin was prepped and draped in the usual sterile fashion. Using a micropuncture kit and the modified Seldinger technique, access was gained to the right common femoral artery and an 8 [place] by 25 cm sheath was placed. Real-time ultrasound guidance was utilized for vascular access including the acquisition of a permanent ultrasound image documenting patency of the accessed vessel. Under fluoroscopy, a Zoom 88 guide catheter was navigated over a 6 [place] neuron select Berenstein catheter and a 0.035" Terumo Glidewire into the aortic arch. The catheter was placed into the left common carotid artery and biplane DSA carotid angiography was performed. Then, under roadmap guidance, the Glidewire was advanced through the segment of occlusion of the cervical ICA and was advanced into the cavernous aspect of the ICA. The neuron select catheter was advanced over the glidewire into the mid cervical ICA and the guide catheter was advanced over the neuron select catheter to place its tip at the petrous aspect of the ICA and biplane DSA cerebral angiogram was obtained. FINDINGS: 1. Normal caliber of the right common femoral artery, adequate for vascular access. 2. Occlusion of  left cervical ICA at the origin. Biplane cerebral angiogram of petrous ICA injection demonstrated occlusion of proximal left M1-MCA. There are multiple non flow-limiting filling defects seen in the A2 and A3 segments of left ACA. 3. A-comm is patent with cross filling of the contralateral ACA. PROCEDURE: Using biplane roadmap, a zoom 071 aspiration catheter was navigated over an Aristotle 24 microguidewire into the cavernous segment of the left ICA. The Aristotle 24 micro guidewire and the aspiration catheter were then advanced to the level of occlusion at the left M1 MCA and connected to an aspiration pump. Continuous aspiration was performed for 2 minutes. The guide catheter was connected to a VacLok syringe. The aspiration catheter was subsequently removed under constant aspiration. The guide catheter was aspirated for debris. Then, biplane DSA cerebral angiogram was performed by injecting contrast through the guide catheter which demonstrated complete recanalization of the inferior division of the left MCA. However, there was an occlusion of the branch of the superior division of the left MCA seen (mid-distal M2/MCA) (TICI 2b). There was also occlusion of the distal M3 branch of the superior division of the left MCA seen as well. Then, recanalization of the M2 branch of the MCA was planned. Under roadmap guidance, and using a Zoom 55 aspiration catheter as well as the Aristotle 14 microguidewire, occluded M2 branch was selected the wire was removed and the aspiration catheter was connected to an aspiration pump. Continuous aspiration was performed for 2 minutes. The guide catheter was connected to a VacLock syringe. The aspiration catheter was subsequently removed under constant aspiration. The guide catheter was aspirated for debris. Then, biplane DSA cerebral angiogram was performed by injecting contrast through the guide catheter which demonstrated persistent occlusion  of the M2 division of the left MCA.  Next, zoom 55 aspiration catheter, phenom 21 microcatheter and Aristotle 14 micro guidewire were selected and under roadmap guidance, the occluded M2 branch of the superior division was selected the microwire and catheter were advanced into the M2 segment, wire was removed, Solitaire X 3 mm x 2 cm was selected and the solitaire stent was deployed spanning the M2 segment. The microcatheter was removed and the aspiration catheter was advanced to the level of the occlusion and was connected to the aspiration pump. The device was allowed to intercalated with the clot for 3 minutes. After 3 minutes, the thrombectomy device and aspiration catheter were removed under constant aspiration. The guide catheter was aspirated for debris. Then, biplane DSA cerebral angiogram was performed by injecting contrast through the guide catheter which demonstrated recanalization of the M2 division (TICI 2c results). However, there were clots seen in the A2 and A3 segments of left ACA. Next, mechanical thrombectomy of the left A2 and A3 segments was planned. A Zoom 55, phenom 21 microcatheter and Arisotle 14 micro guidewire were selected and under roadmap guidance, microwire and microcatheter were navigated into the pericallosal branch of the left ACA. Next, microcatheter was removed. A 3 mm by 20 mm solitaire X stent was selected and was deployed spanning the A3 segment and extending into the distal A2 segment intercalated with the clot for 3 minutes. The microcatheter was removed. The zoom 55 aspiration catheter was advanced over the wire to the level of occlusion and connected to the aspiration pump. The thrombectomy device and aspiration catheter were removed under constant aspiration. The guide catheter was aspirated for debris. Then biplane DSA cerebral angiogram was performed by injecting contrast through the guide catheter which demonstrated T2C results. Next, the guide catheter was withdrawn with its distal end in the distal  common carotid artery and biplane cerebral angiogram was performed demonstrating about 70% stenoses at the proximal aspect of the ICA with mild non flow-limiting dissection. At this point in time, carotid stent placement was planned. Flat panel CT of the head was performed which demonstrated no subarachnoid or intracranial bleed. There was some contrast staining seen at the head of the left caudate nucleus. At this point in time, orogastric tube was inserted and tablet aspirin 81 mg and Brilinta 180 mg was given. Cangrelor bolus infusion was given as well as per the protocol. Subsequently, a 021 Phenom microcatheter and Aristotle 14 micro guidewire were selected and were navigated into the cavernous ICA. The microwire was exchanged for an exchange length 014 zoom wire. Next, under roadmap guidance, XACT stent [id number] was selected and was deployed in the proximal-mid cervical ICA. Another XACT [id number] stent was selected and was deployed overlapping the previous stent and extending into the distal common carotid artery. In view of residual 50-60% in-stent stenosis, a Viatrac 5 mm x 20 mm balloon was selected and angioplasty of the in-stent stenosis was performed. Next, post angioplasty carotid and cerebral angiogram was performed. There was minimal in-stent thrombus formation seen. For this, Integrilin a total of 3 mg was injected through the guide sheath. In view of mild irregularity and spasm at the left A1 segment, right carotid and cerebral angiogram was planned. Under ultrasound guidance and using a micropuncture system, the left common femoral artery was accessed and 5 [place] by 10 cm sheath was placed. Five French vertebral artery catheter was advanced over a 035 inch guidewire and selective catheterization of the right common carotid artery and  in the internal carotid artery was performed and biplane cerebral angiogram was performed. Biplane DSA angiogram demonstrated intracranial ICA, anterior and  middle cerebral artery and their branches to be widely patent. There is patent A-comm seen with excellent cross filling of the contralateral ACA. Venous aspect of the cerebral angiogram is unremarkable Next, vert catheter and the guide catheter were removed. Flat panel CT of the head was performed demonstrating no intracranial bleed. Bilateral femoral angiogram was performed of the right femoral access site was closed using an 8 French Angio-Seal and left femoral access site was closed using 6 [place] Angio-Seal. As the COVID test was not performed and also because of the large body habitus and other comorbid issues, patient was kept intubated and was transferred to the ICU in stable condition. IMPRESSION: 1. Successful mechanical thrombectomy for treatment of an occlusion of the left ICA, left M1/MCA, M2/MCA (superior division), A2 and A3/left ACA with TICI 2C results. Multiple thrombectomy passes performed to achieve this results. 2. Left carotid stents placement. 3. Right carotid and cerebral angiogram demonstrated intracranial right ICA, right anterior and middle cerebral arteries to have a normal appearance. A-comm is widely patent with excellent cross-filling of the left ACA. PLAN: 1. Transfer to ICU. 2. Tab.  Aspirin 81 mg and Tab. Brilinta 90 mg bid 3. BP goals of 120-140 mm Hg Electronically Signed   By: Frazier Richards M.D.   On: 09/25/2021 14:14   IR US Guide Vasc Access Right  Result Date: 09/25/2021 INDICATION: 51 year old female with past medical history significant for GERD, hypertension, IBS, polycystic ovarian syndrome, arthritis, morbid obesity was last seen normal by her son at 2000 hours on 09/24/2021. Son reports that heard a loud third in the morning around 3:50 a.m. and went up and found the patient on the floor, she was weak in her right side and was aphasic. The patient was brought to the ER by EMS. Last known well: 2000 hours on 09/24/2021 Her NIHSS: 25. MRSA: 0 EXAM: ULTRASOUND-GUIDED  VASCULAR ACCESS DIAGNOSTIC CEREBRAL ANGIOGRAM MECHANICAL THROMBECTOMY OF THE LEFT ICA, LEFT M1/MCA, DISTAL M2/MCA OF THE SUPERIOR DIVISION, A3 SEGMENT OF LEFT ACA CAROTID STENT PLACEMENT FOR OCCLUSION OF THE LEFT CAROTID ARTERY FLAT PANEL HEAD CT X2 COMPARISON:  None Available. MEDICATIONS: Verapamil a total of 10 mg at 5 mg, and 2.5 mg increments Cangrelor 15 microgram/kg (patient weighs 114 kg) Aspirin 81 mg and  Brilinta 180 mg through the orogastric tube Integrilin 3 mg Performing surgeon: Dr. Frazier Richards Assistants: Dr. Karenann Cai and Dr. Luanne Bras ANESTHESIA/SEDATION: The procedure was performed under general anesthesia. CONTRAST:  240 mL of Omnipaque 300 milligram/mL FLUOROSCOPY: Radiation Exposure Index (as provided by the fluoroscopic device): 4156 mGy Kerma Fluoro time: 98 minute and 12 seconds COMPLICATIONS: None immediate. TECHNIQUE: Informed written consent was obtained from the patient's son after a thorough discussion of the procedural risks, benefits and alternatives. All questions were addressed. Maximal Sterile Barrier Technique was utilized including caps, mask, sterile gowns, sterile gloves, sterile drape, hand hygiene and skin antiseptic. A timeout was performed prior to the initiation of the procedure. The right groin was prepped and draped in the usual sterile fashion. Using a micropuncture kit and the modified Seldinger technique, access was gained to the right common femoral artery and an 8 [place] by 25 cm sheath was placed. Real-time ultrasound guidance was utilized for vascular access including the acquisition of a permanent ultrasound image documenting patency of the accessed vessel. Under fluoroscopy, a Zoom 88 guide  catheter was navigated over a 6 [place] neuron select Berenstein catheter and a 0.035" Terumo Glidewire into the aortic arch. The catheter was placed into the left common carotid artery and biplane DSA carotid angiography was performed. Then, under roadmap  guidance, the Glidewire was advanced through the segment of occlusion of the cervical ICA and was advanced into the cavernous aspect of the ICA. The neuron select catheter was advanced over the glidewire into the mid cervical ICA and the guide catheter was advanced over the neuron select catheter to place its tip at the petrous aspect of the ICA and biplane DSA cerebral angiogram was obtained. FINDINGS: 1. Normal caliber of the right common femoral artery, adequate for vascular access. 2. Occlusion of left cervical ICA at the origin. Biplane cerebral angiogram of petrous ICA injection demonstrated occlusion of proximal left M1-MCA. There are multiple non flow-limiting filling defects seen in the A2 and A3 segments of left ACA. 3. A-comm is patent with cross filling of the contralateral ACA. PROCEDURE: Using biplane roadmap, a zoom 071 aspiration catheter was navigated over an Aristotle 24 microguidewire into the cavernous segment of the left ICA. The Aristotle 24 micro guidewire and the aspiration catheter were then advanced to the level of occlusion at the left M1 MCA and connected to an aspiration pump. Continuous aspiration was performed for 2 minutes. The guide catheter was connected to a VacLok syringe. The aspiration catheter was subsequently removed under constant aspiration. The guide catheter was aspirated for debris. Then, biplane DSA cerebral angiogram was performed by injecting contrast through the guide catheter which demonstrated complete recanalization of the inferior division of the left MCA. However, there was an occlusion of the branch of the superior division of the left MCA seen (mid-distal M2/MCA) (TICI 2b). There was also occlusion of the distal M3 branch of the superior division of the left MCA seen as well. Then, recanalization of the M2 branch of the MCA was planned. Under roadmap guidance, and using a Zoom 55 aspiration catheter as well as the Aristotle 14 microguidewire, occluded M2 branch  was selected the wire was removed and the aspiration catheter was connected to an aspiration pump. Continuous aspiration was performed for 2 minutes. The guide catheter was connected to a VacLock syringe. The aspiration catheter was subsequently removed under constant aspiration. The guide catheter was aspirated for debris. Then, biplane DSA cerebral angiogram was performed by injecting contrast through the guide catheter which demonstrated persistent occlusion of the M2 division of the left MCA. Next, zoom 55 aspiration catheter, phenom 21 microcatheter and Aristotle 14 micro guidewire were selected and under roadmap guidance, the occluded M2 branch of the superior division was selected the microwire and catheter were advanced into the M2 segment, wire was removed, Solitaire X 3 mm x 2 cm was selected and the solitaire stent was deployed spanning the M2 segment. The microcatheter was removed and the aspiration catheter was advanced to the level of the occlusion and was connected to the aspiration pump. The device was allowed to intercalated with the clot for 3 minutes. After 3 minutes, the thrombectomy device and aspiration catheter were removed under constant aspiration. The guide catheter was aspirated for debris. Then, biplane DSA cerebral angiogram was performed by injecting contrast through the guide catheter which demonstrated recanalization of the M2 division (TICI 2c results). However, there were clots seen in the A2 and A3 segments of left ACA. Next, mechanical thrombectomy of the left A2 and A3 segments was planned. A Zoom 55, phenom  21 microcatheter and Arisotle 14 micro guidewire were selected and under roadmap guidance, microwire and microcatheter were navigated into the pericallosal branch of the left ACA. Next, microcatheter was removed. A 3 mm by 20 mm solitaire X stent was selected and was deployed spanning the A3 segment and extending into the distal A2 segment intercalated with the clot for 3  minutes. The microcatheter was removed. The zoom 55 aspiration catheter was advanced over the wire to the level of occlusion and connected to the aspiration pump. The thrombectomy device and aspiration catheter were removed under constant aspiration. The guide catheter was aspirated for debris. Then biplane DSA cerebral angiogram was performed by injecting contrast through the guide catheter which demonstrated T2C results. Next, the guide catheter was withdrawn with its distal end in the distal common carotid artery and biplane cerebral angiogram was performed demonstrating about 70% stenoses at the proximal aspect of the ICA with mild non flow-limiting dissection. At this point in time, carotid stent placement was planned. Flat panel CT of the head was performed which demonstrated no subarachnoid or intracranial bleed. There was some contrast staining seen at the head of the left caudate nucleus. At this point in time, orogastric tube was inserted and tablet aspirin 81 mg and Brilinta 180 mg was given. Cangrelor bolus infusion was given as well as per the protocol. Subsequently, a 021 Phenom microcatheter and Aristotle 14 micro guidewire were selected and were navigated into the cavernous ICA. The microwire was exchanged for an exchange length 014 zoom wire. Next, under roadmap guidance, XACT stent [id number] was selected and was deployed in the proximal-mid cervical ICA. Another XACT [id number] stent was selected and was deployed overlapping the previous stent and extending into the distal common carotid artery. In view of residual 50-60% in-stent stenosis, a Viatrac 5 mm x 20 mm balloon was selected and angioplasty of the in-stent stenosis was performed. Next, post angioplasty carotid and cerebral angiogram was performed. There was minimal in-stent thrombus formation seen. For this, Integrilin a total of 3 mg was injected through the guide sheath. In view of mild irregularity and spasm at the left A1 segment,  right carotid and cerebral angiogram was planned. Under ultrasound guidance and using a micropuncture system, the left common femoral artery was accessed and 5 [place] by 10 cm sheath was placed. Five French vertebral artery catheter was advanced over a 035 inch guidewire and selective catheterization of the right common carotid artery and in the internal carotid artery was performed and biplane cerebral angiogram was performed. Biplane DSA angiogram demonstrated intracranial ICA, anterior and middle cerebral artery and their branches to be widely patent. There is patent A-comm seen with excellent cross filling of the contralateral ACA. Venous aspect of the cerebral angiogram is unremarkable Next, vert catheter and the guide catheter were removed. Flat panel CT of the head was performed demonstrating no intracranial bleed. Bilateral femoral angiogram was performed of the right femoral access site was closed using an 8 French Angio-Seal and left femoral access site was closed using 6 [place] Angio-Seal. As the COVID test was not performed and also because of the large body habitus and other comorbid issues, patient was kept intubated and was transferred to the ICU in stable condition. IMPRESSION: 1. Successful mechanical thrombectomy for treatment of an occlusion of the left ICA, left M1/MCA, M2/MCA (superior division), A2 and A3/left ACA with TICI 2C results. Multiple thrombectomy passes performed to achieve this results. 2. Left carotid stents placement. 3. Right carotid and cerebral  angiogram demonstrated intracranial right ICA, right anterior and middle cerebral arteries to have a normal appearance. A-comm is widely patent with excellent cross-filling of the left ACA. PLAN: 1. Transfer to ICU. 2. Tab.  Aspirin 81 mg and Tab. Brilinta 90 mg bid 3. BP goals of 120-140 mm Hg Electronically Signed   By: Frazier Richards M.D.   On: 09/25/2021 14:14   IR CT Head Ltd  Result Date: 09/25/2021 INDICATION: 51 year old  female with past medical history significant for GERD, hypertension, IBS, polycystic ovarian syndrome, arthritis, morbid obesity was last seen normal by her son at 2000 hours on 09/24/2021. Son reports that heard a loud third in the morning around 3:50 a.m. and went up and found the patient on the floor, she was weak in her right side and was aphasic. The patient was brought to the ER by EMS. Last known well: 2000 hours on 09/24/2021 Her NIHSS: 25. MRSA: 0 EXAM: ULTRASOUND-GUIDED VASCULAR ACCESS DIAGNOSTIC CEREBRAL ANGIOGRAM MECHANICAL THROMBECTOMY OF THE LEFT ICA, LEFT M1/MCA, DISTAL M2/MCA OF THE SUPERIOR DIVISION, A3 SEGMENT OF LEFT ACA CAROTID STENT PLACEMENT FOR OCCLUSION OF THE LEFT CAROTID ARTERY FLAT PANEL HEAD CT X2 COMPARISON:  None Available. MEDICATIONS: Verapamil a total of 10 mg at 5 mg, and 2.5 mg increments Cangrelor 15 microgram/kg (patient weighs 114 kg) Aspirin 81 mg and  Brilinta 180 mg through the orogastric tube Integrilin 3 mg Performing surgeon: Dr. Frazier Richards Assistants: Dr. Karenann Cai and Dr. Luanne Bras ANESTHESIA/SEDATION: The procedure was performed under general anesthesia. CONTRAST:  240 mL of Omnipaque 300 milligram/mL FLUOROSCOPY: Radiation Exposure Index (as provided by the fluoroscopic device): 4156 mGy Kerma Fluoro time: 98 minute and 12 seconds COMPLICATIONS: None immediate. TECHNIQUE: Informed written consent was obtained from the patient's son after a thorough discussion of the procedural risks, benefits and alternatives. All questions were addressed. Maximal Sterile Barrier Technique was utilized including caps, mask, sterile gowns, sterile gloves, sterile drape, hand hygiene and skin antiseptic. A timeout was performed prior to the initiation of the procedure. The right groin was prepped and draped in the usual sterile fashion. Using a micropuncture kit and the modified Seldinger technique, access was gained to the right common femoral artery and an 8 [place]  by 25 cm sheath was placed. Real-time ultrasound guidance was utilized for vascular access including the acquisition of a permanent ultrasound image documenting patency of the accessed vessel. Under fluoroscopy, a Zoom 88 guide catheter was navigated over a 6 [place] neuron select Berenstein catheter and a 0.035" Terumo Glidewire into the aortic arch. The catheter was placed into the left common carotid artery and biplane DSA carotid angiography was performed. Then, under roadmap guidance, the Glidewire was advanced through the segment of occlusion of the cervical ICA and was advanced into the cavernous aspect of the ICA. The neuron select catheter was advanced over the glidewire into the mid cervical ICA and the guide catheter was advanced over the neuron select catheter to place its tip at the petrous aspect of the ICA and biplane DSA cerebral angiogram was obtained. FINDINGS: 1. Normal caliber of the right common femoral artery, adequate for vascular access. 2. Occlusion of left cervical ICA at the origin. Biplane cerebral angiogram of petrous ICA injection demonstrated occlusion of proximal left M1-MCA. There are multiple non flow-limiting filling defects seen in the A2 and A3 segments of left ACA. 3. A-comm is patent with cross filling of the contralateral ACA. PROCEDURE: Using biplane roadmap, a zoom 071 aspiration catheter was  navigated over an Aristotle 24 microguidewire into the cavernous segment of the left ICA. The Aristotle 24 micro guidewire and the aspiration catheter were then advanced to the level of occlusion at the left M1 MCA and connected to an aspiration pump. Continuous aspiration was performed for 2 minutes. The guide catheter was connected to a VacLok syringe. The aspiration catheter was subsequently removed under constant aspiration. The guide catheter was aspirated for debris. Then, biplane DSA cerebral angiogram was performed by injecting contrast through the guide catheter which  demonstrated complete recanalization of the inferior division of the left MCA. However, there was an occlusion of the branch of the superior division of the left MCA seen (mid-distal M2/MCA) (TICI 2b). There was also occlusion of the distal M3 branch of the superior division of the left MCA seen as well. Then, recanalization of the M2 branch of the MCA was planned. Under roadmap guidance, and using a Zoom 55 aspiration catheter as well as the Aristotle 14 microguidewire, occluded M2 branch was selected the wire was removed and the aspiration catheter was connected to an aspiration pump. Continuous aspiration was performed for 2 minutes. The guide catheter was connected to a VacLock syringe. The aspiration catheter was subsequently removed under constant aspiration. The guide catheter was aspirated for debris. Then, biplane DSA cerebral angiogram was performed by injecting contrast through the guide catheter which demonstrated persistent occlusion of the M2 division of the left MCA. Next, zoom 55 aspiration catheter, phenom 21 microcatheter and Aristotle 14 micro guidewire were selected and under roadmap guidance, the occluded M2 branch of the superior division was selected the microwire and catheter were advanced into the M2 segment, wire was removed, Solitaire X 3 mm x 2 cm was selected and the solitaire stent was deployed spanning the M2 segment. The microcatheter was removed and the aspiration catheter was advanced to the level of the occlusion and was connected to the aspiration pump. The device was allowed to intercalated with the clot for 3 minutes. After 3 minutes, the thrombectomy device and aspiration catheter were removed under constant aspiration. The guide catheter was aspirated for debris. Then, biplane DSA cerebral angiogram was performed by injecting contrast through the guide catheter which demonstrated recanalization of the M2 division (TICI 2c results). However, there were clots seen in the A2 and  A3 segments of left ACA. Next, mechanical thrombectomy of the left A2 and A3 segments was planned. A Zoom 55, phenom 21 microcatheter and Arisotle 14 micro guidewire were selected and under roadmap guidance, microwire and microcatheter were navigated into the pericallosal branch of the left ACA. Next, microcatheter was removed. A 3 mm by 20 mm solitaire X stent was selected and was deployed spanning the A3 segment and extending into the distal A2 segment intercalated with the clot for 3 minutes. The microcatheter was removed. The zoom 55 aspiration catheter was advanced over the wire to the level of occlusion and connected to the aspiration pump. The thrombectomy device and aspiration catheter were removed under constant aspiration. The guide catheter was aspirated for debris. Then biplane DSA cerebral angiogram was performed by injecting contrast through the guide catheter which demonstrated T2C results. Next, the guide catheter was withdrawn with its distal end in the distal common carotid artery and biplane cerebral angiogram was performed demonstrating about 70% stenoses at the proximal aspect of the ICA with mild non flow-limiting dissection. At this point in time, carotid stent placement was planned. Flat panel CT of the head was performed which demonstrated  no subarachnoid or intracranial bleed. There was some contrast staining seen at the head of the left caudate nucleus. At this point in time, orogastric tube was inserted and tablet aspirin 81 mg and Brilinta 180 mg was given. Cangrelor bolus infusion was given as well as per the protocol. Subsequently, a 021 Phenom microcatheter and Aristotle 14 micro guidewire were selected and were navigated into the cavernous ICA. The microwire was exchanged for an exchange length 014 zoom wire. Next, under roadmap guidance, XACT stent [id number] was selected and was deployed in the proximal-mid cervical ICA. Another XACT [id number] stent was selected and was deployed  overlapping the previous stent and extending into the distal common carotid artery. In view of residual 50-60% in-stent stenosis, a Viatrac 5 mm x 20 mm balloon was selected and angioplasty of the in-stent stenosis was performed. Next, post angioplasty carotid and cerebral angiogram was performed. There was minimal in-stent thrombus formation seen. For this, Integrilin a total of 3 mg was injected through the guide sheath. In view of mild irregularity and spasm at the left A1 segment, right carotid and cerebral angiogram was planned. Under ultrasound guidance and using a micropuncture system, the left common femoral artery was accessed and 5 [place] by 10 cm sheath was placed. Five French vertebral artery catheter was advanced over a 035 inch guidewire and selective catheterization of the right common carotid artery and in the internal carotid artery was performed and biplane cerebral angiogram was performed. Biplane DSA angiogram demonstrated intracranial ICA, anterior and middle cerebral artery and their branches to be widely patent. There is patent A-comm seen with excellent cross filling of the contralateral ACA. Venous aspect of the cerebral angiogram is unremarkable Next, vert catheter and the guide catheter were removed. Flat panel CT of the head was performed demonstrating no intracranial bleed. Bilateral femoral angiogram was performed of the right femoral access site was closed using an 8 French Angio-Seal and left femoral access site was closed using 6 [place] Angio-Seal. As the COVID test was not performed and also because of the large body habitus and other comorbid issues, patient was kept intubated and was transferred to the ICU in stable condition. IMPRESSION: 1. Successful mechanical thrombectomy for treatment of an occlusion of the left ICA, left M1/MCA, M2/MCA (superior division), A2 and A3/left ACA with TICI 2C results. Multiple thrombectomy passes performed to achieve this results. 2. Left carotid  stents placement. 3. Right carotid and cerebral angiogram demonstrated intracranial right ICA, right anterior and middle cerebral arteries to have a normal appearance. A-comm is widely patent with excellent cross-filling of the left ACA. PLAN: 1. Transfer to ICU. 2. Tab.  Aspirin 81 mg and Tab. Brilinta 90 mg bid 3. BP goals of 120-140 mm Hg Electronically Signed   By: Frazier Richards M.D.   On: 09/25/2021 14:14   IR CT Head Ltd  Result Date: 09/25/2021 INDICATION: 51 year old female with past medical history significant for GERD, hypertension, IBS, polycystic ovarian syndrome, arthritis, morbid obesity was last seen normal by her son at 2000 hours on 09/24/2021. Son reports that heard a loud third in the morning around 3:50 a.m. and went up and found the patient on the floor, she was weak in her right side and was aphasic. The patient was brought to the ER by EMS. Last known well: 2000 hours on 09/24/2021 Her NIHSS: 25. MRSA: 0 EXAM: ULTRASOUND-GUIDED VASCULAR ACCESS DIAGNOSTIC CEREBRAL ANGIOGRAM MECHANICAL THROMBECTOMY OF THE LEFT ICA, LEFT M1/MCA, DISTAL M2/MCA OF THE SUPERIOR DIVISION, A3  SEGMENT OF LEFT ACA CAROTID STENT PLACEMENT FOR OCCLUSION OF THE LEFT CAROTID ARTERY FLAT PANEL HEAD CT X2 COMPARISON:  None Available. MEDICATIONS: Verapamil a total of 10 mg at 5 mg, and 2.5 mg increments Cangrelor 15 microgram/kg (patient weighs 114 kg) Aspirin 81 mg and  Brilinta 180 mg through the orogastric tube Integrilin 3 mg Performing surgeon: Dr. Frazier Richards Assistants: Dr. Karenann Cai and Dr. Luanne Bras ANESTHESIA/SEDATION: The procedure was performed under general anesthesia. CONTRAST:  240 mL of Omnipaque 300 milligram/mL FLUOROSCOPY: Radiation Exposure Index (as provided by the fluoroscopic device): 4156 mGy Kerma Fluoro time: 98 minute and 12 seconds COMPLICATIONS: None immediate. TECHNIQUE: Informed written consent was obtained from the patient's son after a thorough discussion of the  procedural risks, benefits and alternatives. All questions were addressed. Maximal Sterile Barrier Technique was utilized including caps, mask, sterile gowns, sterile gloves, sterile drape, hand hygiene and skin antiseptic. A timeout was performed prior to the initiation of the procedure. The right groin was prepped and draped in the usual sterile fashion. Using a micropuncture kit and the modified Seldinger technique, access was gained to the right common femoral artery and an 8 [place] by 25 cm sheath was placed. Real-time ultrasound guidance was utilized for vascular access including the acquisition of a permanent ultrasound image documenting patency of the accessed vessel. Under fluoroscopy, a Zoom 88 guide catheter was navigated over a 6 [place] neuron select Berenstein catheter and a 0.035" Terumo Glidewire into the aortic arch. The catheter was placed into the left common carotid artery and biplane DSA carotid angiography was performed. Then, under roadmap guidance, the Glidewire was advanced through the segment of occlusion of the cervical ICA and was advanced into the cavernous aspect of the ICA. The neuron select catheter was advanced over the glidewire into the mid cervical ICA and the guide catheter was advanced over the neuron select catheter to place its tip at the petrous aspect of the ICA and biplane DSA cerebral angiogram was obtained. FINDINGS: 1. Normal caliber of the right common femoral artery, adequate for vascular access. 2. Occlusion of left cervical ICA at the origin. Biplane cerebral angiogram of petrous ICA injection demonstrated occlusion of proximal left M1-MCA. There are multiple non flow-limiting filling defects seen in the A2 and A3 segments of left ACA. 3. A-comm is patent with cross filling of the contralateral ACA. PROCEDURE: Using biplane roadmap, a zoom 071 aspiration catheter was navigated over an Aristotle 24 microguidewire into the cavernous segment of the left ICA. The  Aristotle 24 micro guidewire and the aspiration catheter were then advanced to the level of occlusion at the left M1 MCA and connected to an aspiration pump. Continuous aspiration was performed for 2 minutes. The guide catheter was connected to a VacLok syringe. The aspiration catheter was subsequently removed under constant aspiration. The guide catheter was aspirated for debris. Then, biplane DSA cerebral angiogram was performed by injecting contrast through the guide catheter which demonstrated complete recanalization of the inferior division of the left MCA. However, there was an occlusion of the branch of the superior division of the left MCA seen (mid-distal M2/MCA) (TICI 2b). There was also occlusion of the distal M3 branch of the superior division of the left MCA seen as well. Then, recanalization of the M2 branch of the MCA was planned. Under roadmap guidance, and using a Zoom 55 aspiration catheter as well as the Aristotle 14 microguidewire, occluded M2 branch was selected the wire was removed and the aspiration  catheter was connected to an aspiration pump. Continuous aspiration was performed for 2 minutes. The guide catheter was connected to a VacLock syringe. The aspiration catheter was subsequently removed under constant aspiration. The guide catheter was aspirated for debris. Then, biplane DSA cerebral angiogram was performed by injecting contrast through the guide catheter which demonstrated persistent occlusion of the M2 division of the left MCA. Next, zoom 55 aspiration catheter, phenom 21 microcatheter and Aristotle 14 micro guidewire were selected and under roadmap guidance, the occluded M2 branch of the superior division was selected the microwire and catheter were advanced into the M2 segment, wire was removed, Solitaire X 3 mm x 2 cm was selected and the solitaire stent was deployed spanning the M2 segment. The microcatheter was removed and the aspiration catheter was advanced to the level of  the occlusion and was connected to the aspiration pump. The device was allowed to intercalated with the clot for 3 minutes. After 3 minutes, the thrombectomy device and aspiration catheter were removed under constant aspiration. The guide catheter was aspirated for debris. Then, biplane DSA cerebral angiogram was performed by injecting contrast through the guide catheter which demonstrated recanalization of the M2 division (TICI 2c results). However, there were clots seen in the A2 and A3 segments of left ACA. Next, mechanical thrombectomy of the left A2 and A3 segments was planned. A Zoom 55, phenom 21 microcatheter and Arisotle 14 micro guidewire were selected and under roadmap guidance, microwire and microcatheter were navigated into the pericallosal branch of the left ACA. Next, microcatheter was removed. A 3 mm by 20 mm solitaire X stent was selected and was deployed spanning the A3 segment and extending into the distal A2 segment intercalated with the clot for 3 minutes. The microcatheter was removed. The zoom 55 aspiration catheter was advanced over the wire to the level of occlusion and connected to the aspiration pump. The thrombectomy device and aspiration catheter were removed under constant aspiration. The guide catheter was aspirated for debris. Then biplane DSA cerebral angiogram was performed by injecting contrast through the guide catheter which demonstrated T2C results. Next, the guide catheter was withdrawn with its distal end in the distal common carotid artery and biplane cerebral angiogram was performed demonstrating about 70% stenoses at the proximal aspect of the ICA with mild non flow-limiting dissection. At this point in time, carotid stent placement was planned. Flat panel CT of the head was performed which demonstrated no subarachnoid or intracranial bleed. There was some contrast staining seen at the head of the left caudate nucleus. At this point in time, orogastric tube was inserted and  tablet aspirin 81 mg and Brilinta 180 mg was given. Cangrelor bolus infusion was given as well as per the protocol. Subsequently, a 021 Phenom microcatheter and Aristotle 14 micro guidewire were selected and were navigated into the cavernous ICA. The microwire was exchanged for an exchange length 014 zoom wire. Next, under roadmap guidance, XACT stent [id number] was selected and was deployed in the proximal-mid cervical ICA. Another XACT [id number] stent was selected and was deployed overlapping the previous stent and extending into the distal common carotid artery. In view of residual 50-60% in-stent stenosis, a Viatrac 5 mm x 20 mm balloon was selected and angioplasty of the in-stent stenosis was performed. Next, post angioplasty carotid and cerebral angiogram was performed. There was minimal in-stent thrombus formation seen. For this, Integrilin a total of 3 mg was injected through the guide sheath. In view of mild irregularity and spasm at  the left A1 segment, right carotid and cerebral angiogram was planned. Under ultrasound guidance and using a micropuncture system, the left common femoral artery was accessed and 5 [place] by 10 cm sheath was placed. Five French vertebral artery catheter was advanced over a 035 inch guidewire and selective catheterization of the right common carotid artery and in the internal carotid artery was performed and biplane cerebral angiogram was performed. Biplane DSA angiogram demonstrated intracranial ICA, anterior and middle cerebral artery and their branches to be widely patent. There is patent A-comm seen with excellent cross filling of the contralateral ACA. Venous aspect of the cerebral angiogram is unremarkable Next, vert catheter and the guide catheter were removed. Flat panel CT of the head was performed demonstrating no intracranial bleed. Bilateral femoral angiogram was performed of the right femoral access site was closed using an 8 [place] Angio-Seal and left femoral  access site was closed using 6 [place] Angio-Seal. As the COVID test was not performed and also because of the large body habitus and other comorbid issues, patient was kept intubated and was transferred to the ICU in stable condition. IMPRESSION: 1. Successful mechanical thrombectomy for treatment of an occlusion of the left ICA, left M1/MCA, M2/MCA (superior division), A2 and A3/left ACA with TICI 2C results. Multiple thrombectomy passes performed to achieve this results. 2. Left carotid stents placement. 3. Right carotid and cerebral angiogram demonstrated intracranial right ICA, right anterior and middle cerebral arteries to have a normal appearance. A-comm is widely patent with excellent cross-filling of the left ACA. PLAN: 1. Transfer to ICU. 2. Tab.  Aspirin 81 mg and Tab. Brilinta 90 mg bid 3. BP goals of 120-140 mm Hg Electronically Signed   By: Frazier Richards M.D.   On: 09/25/2021 14:14   IR ANGIO INTRA EXTRACRAN SEL COM CAROTID INNOMINATE UNI R MOD SED  Result Date: 09/25/2021 INDICATION: 51 year old female with past medical history significant for GERD, hypertension, IBS, polycystic ovarian syndrome, arthritis, morbid obesity was last seen normal by her son at 2000 hours on 09/24/2021. Son reports that heard a loud third in the morning around 3:50 a.m. and went up and found the patient on the floor, she was weak in her right side and was aphasic. The patient was brought to the ER by EMS. Last known well: 2000 hours on 09/24/2021 Her NIHSS: 25. MRSA: 0 EXAM: ULTRASOUND-GUIDED VASCULAR ACCESS DIAGNOSTIC CEREBRAL ANGIOGRAM MECHANICAL THROMBECTOMY OF THE LEFT ICA, LEFT M1/MCA, DISTAL M2/MCA OF THE SUPERIOR DIVISION, A3 SEGMENT OF LEFT ACA CAROTID STENT PLACEMENT FOR OCCLUSION OF THE LEFT CAROTID ARTERY FLAT PANEL HEAD CT X2 COMPARISON:  None Available. MEDICATIONS: Verapamil a total of 10 mg at 5 mg, and 2.5 mg increments Cangrelor 15 microgram/kg (patient weighs 114 kg) Aspirin 81 mg and  Brilinta 180 mg  through the orogastric tube Integrilin 3 mg Performing surgeon: Dr. Frazier Richards Assistants: Dr. Karenann Cai and Dr. Luanne Bras ANESTHESIA/SEDATION: The procedure was performed under general anesthesia. CONTRAST:  240 mL of Omnipaque 300 milligram/mL FLUOROSCOPY: Radiation Exposure Index (as provided by the fluoroscopic device): 4156 mGy Kerma Fluoro time: 98 minute and 12 seconds COMPLICATIONS: None immediate. TECHNIQUE: Informed written consent was obtained from the patient's son after a thorough discussion of the procedural risks, benefits and alternatives. All questions were addressed. Maximal Sterile Barrier Technique was utilized including caps, mask, sterile gowns, sterile gloves, sterile drape, hand hygiene and skin antiseptic. A timeout was performed prior to the initiation of the procedure. The right groin was prepped  and draped in the usual sterile fashion. Using a micropuncture kit and the modified Seldinger technique, access was gained to the right common femoral artery and an 8 [place] by 25 cm sheath was placed. Real-time ultrasound guidance was utilized for vascular access including the acquisition of a permanent ultrasound image documenting patency of the accessed vessel. Under fluoroscopy, a Zoom 88 guide catheter was navigated over a 6 [place] neuron select Berenstein catheter and a 0.035" Terumo Glidewire into the aortic arch. The catheter was placed into the left common carotid artery and biplane DSA carotid angiography was performed. Then, under roadmap guidance, the Glidewire was advanced through the segment of occlusion of the cervical ICA and was advanced into the cavernous aspect of the ICA. The neuron select catheter was advanced over the glidewire into the mid cervical ICA and the guide catheter was advanced over the neuron select catheter to place its tip at the petrous aspect of the ICA and biplane DSA cerebral angiogram was obtained. FINDINGS: 1. Normal caliber of the  right common femoral artery, adequate for vascular access. 2. Occlusion of left cervical ICA at the origin. Biplane cerebral angiogram of petrous ICA injection demonstrated occlusion of proximal left M1-MCA. There are multiple non flow-limiting filling defects seen in the A2 and A3 segments of left ACA. 3. A-comm is patent with cross filling of the contralateral ACA. PROCEDURE: Using biplane roadmap, a zoom 071 aspiration catheter was navigated over an Aristotle 24 microguidewire into the cavernous segment of the left ICA. The Aristotle 24 micro guidewire and the aspiration catheter were then advanced to the level of occlusion at the left M1 MCA and connected to an aspiration pump. Continuous aspiration was performed for 2 minutes. The guide catheter was connected to a VacLok syringe. The aspiration catheter was subsequently removed under constant aspiration. The guide catheter was aspirated for debris. Then, biplane DSA cerebral angiogram was performed by injecting contrast through the guide catheter which demonstrated complete recanalization of the inferior division of the left MCA. However, there was an occlusion of the branch of the superior division of the left MCA seen (mid-distal M2/MCA) (TICI 2b). There was also occlusion of the distal M3 branch of the superior division of the left MCA seen as well. Then, recanalization of the M2 branch of the MCA was planned. Under roadmap guidance, and using a Zoom 55 aspiration catheter as well as the Aristotle 14 microguidewire, occluded M2 branch was selected the wire was removed and the aspiration catheter was connected to an aspiration pump. Continuous aspiration was performed for 2 minutes. The guide catheter was connected to a VacLock syringe. The aspiration catheter was subsequently removed under constant aspiration. The guide catheter was aspirated for debris. Then, biplane DSA cerebral angiogram was performed by injecting contrast through the guide catheter which  demonstrated persistent occlusion of the M2 division of the left MCA. Next, zoom 55 aspiration catheter, phenom 21 microcatheter and Aristotle 14 micro guidewire were selected and under roadmap guidance, the occluded M2 branch of the superior division was selected the microwire and catheter were advanced into the M2 segment, wire was removed, Solitaire X 3 mm x 2 cm was selected and the solitaire stent was deployed spanning the M2 segment. The microcatheter was removed and the aspiration catheter was advanced to the level of the occlusion and was connected to the aspiration pump. The device was allowed to intercalated with the clot for 3 minutes. After 3 minutes, the thrombectomy device and aspiration catheter were removed under  constant aspiration. The guide catheter was aspirated for debris. Then, biplane DSA cerebral angiogram was performed by injecting contrast through the guide catheter which demonstrated recanalization of the M2 division (TICI 2c results). However, there were clots seen in the A2 and A3 segments of left ACA. Next, mechanical thrombectomy of the left A2 and A3 segments was planned. A Zoom 55, phenom 21 microcatheter and Arisotle 14 micro guidewire were selected and under roadmap guidance, microwire and microcatheter were navigated into the pericallosal branch of the left ACA. Next, microcatheter was removed. A 3 mm by 20 mm solitaire X stent was selected and was deployed spanning the A3 segment and extending into the distal A2 segment intercalated with the clot for 3 minutes. The microcatheter was removed. The zoom 55 aspiration catheter was advanced over the wire to the level of occlusion and connected to the aspiration pump. The thrombectomy device and aspiration catheter were removed under constant aspiration. The guide catheter was aspirated for debris. Then biplane DSA cerebral angiogram was performed by injecting contrast through the guide catheter which demonstrated T2C results. Next,  the guide catheter was withdrawn with its distal end in the distal common carotid artery and biplane cerebral angiogram was performed demonstrating about 70% stenoses at the proximal aspect of the ICA with mild non flow-limiting dissection. At this point in time, carotid stent placement was planned. Flat panel CT of the head was performed which demonstrated no subarachnoid or intracranial bleed. There was some contrast staining seen at the head of the left caudate nucleus. At this point in time, orogastric tube was inserted and tablet aspirin 81 mg and Brilinta 180 mg was given. Cangrelor bolus infusion was given as well as per the protocol. Subsequently, a 021 Phenom microcatheter and Aristotle 14 micro guidewire were selected and were navigated into the cavernous ICA. The microwire was exchanged for an exchange length 014 zoom wire. Next, under roadmap guidance, XACT stent [id number] was selected and was deployed in the proximal-mid cervical ICA. Another XACT [id number] stent was selected and was deployed overlapping the previous stent and extending into the distal common carotid artery. In view of residual 50-60% in-stent stenosis, a Viatrac 5 mm x 20 mm balloon was selected and angioplasty of the in-stent stenosis was performed. Next, post angioplasty carotid and cerebral angiogram was performed. There was minimal in-stent thrombus formation seen. For this, Integrilin a total of 3 mg was injected through the guide sheath. In view of mild irregularity and spasm at the left A1 segment, right carotid and cerebral angiogram was planned. Under ultrasound guidance and using a micropuncture system, the left common femoral artery was accessed and 5 [place] by 10 cm sheath was placed. Five French vertebral artery catheter was advanced over a 035 inch guidewire and selective catheterization of the right common carotid artery and in the internal carotid artery was performed and biplane cerebral angiogram was performed.  Biplane DSA angiogram demonstrated intracranial ICA, anterior and middle cerebral artery and their branches to be widely patent. There is patent A-comm seen with excellent cross filling of the contralateral ACA. Venous aspect of the cerebral angiogram is unremarkable Next, vert catheter and the guide catheter were removed. Flat panel CT of the head was performed demonstrating no intracranial bleed. Bilateral femoral angiogram was performed of the right femoral access site was closed using an 8 French Angio-Seal and left femoral access site was closed using 6 [place] Angio-Seal. As the COVID test was not performed and also because of the large body  habitus and other comorbid issues, patient was kept intubated and was transferred to the ICU in stable condition. IMPRESSION: 1. Successful mechanical thrombectomy for treatment of an occlusion of the left ICA, left M1/MCA, M2/MCA (superior division), A2 and A3/left ACA with TICI 2C results. Multiple thrombectomy passes performed to achieve this results. 2. Left carotid stents placement. 3. Right carotid and cerebral angiogram demonstrated intracranial right ICA, right anterior and middle cerebral arteries to have a normal appearance. A-comm is widely patent with excellent cross-filling of the left ACA. PLAN: 1. Transfer to ICU. 2. Tab.  Aspirin 81 mg and Tab. Brilinta 90 mg bid 3. BP goals of 120-140 mm Hg Electronically Signed   By: Frazier Richards M.D.   On: 09/25/2021 14:14   DG Abd Portable 1V  Result Date: 09/25/2021 CLINICAL DATA:  Insertion of endotracheal tube and orogastric tube EXAM: PORTABLE ABDOMEN - 1 VIEW COMPARISON:  Portable exam 1106 hours without priors for comparison FINDINGS: Tip of orogastric tube projects over proximal stomach. Subsegmental atelectasis LEFT lung base. Visualized bowel gas pattern normal. No osseous abnormalities. IMPRESSION: Tip of orogastric tube projects over proximal stomach. LEFT basilar atelectasis. Electronically Signed   By:  Lavonia Dana M.D.   On: 09/25/2021 11:16   DG CHEST PORT 1 VIEW  Result Date: 09/25/2021 CLINICAL DATA:  Ventilator dependence. EXAM: PORTABLE CHEST 1 VIEW COMPARISON:  12/01/2012 FINDINGS: 1105 hours. Endotracheal tube tip is 4.4 cm above the base of the carina. The NG tube passes into the stomach although the distal tip position is not included on the film. Low volume film with vascular congestion. Patchy airspace disease noted right upper lobe, left mid lung and left base. No substantial pleural effusion. Cardiopericardial silhouette is at upper limits of normal for size. Telemetry leads overlie the chest. IMPRESSION: 1. Endotracheal tube tip is 4.4 cm above the base of the carina. 2. Patchy airspace disease bilaterally compatible with asymmetric edema or multifocal pneumonia. Electronically Signed   By: Misty Stanley M.D.   On: 09/25/2021 11:16   IR INTRAVSC STENT CERV CAROTID W/O EMB-PROT MOD SED  Result Date: 09/25/2021 Glori Bickers, MD     09/25/2021 10:55 AM INTERVENTIONAL NEURORADIOLOGY BRIEF POSTPROCEDURE NOTE  DIAGNOSTIC CEREBRAL ANGIOGRAM  Attending: Dr. Frazier Richards  Assistant: Dr. Ladean Raya and Dr. Luanne Bras  Diagnosis: Left cervical ICA and M1/MCA occlusion  Access site: RCFA, 51F, Left CFA 6 F  Access closure: Angoioseal 8 Fr Rt and Angioseal 6 Fr left  Anesthesia: GETA  Medication used: refer to anesthesia documentation.  Complications: none.  Estimated blood loss: 70 mL  Specimen: None.  Findings: Occlusion of the left ICA, Lt M1/MCA. Mechanical thrombectomy performed with direct contact aspiration x1 with complete recanalization of the MCA(TICI 2b results,). There was occlusion of the left distal M2/MCA, superior division, thrombectomy with contact aspiration and stentriever techniques. Partially occluding thrombus in the distal A2 branches of left ACA, thrombectomy by the stentriever technique with TICI 2c results Left carotid stenosis ~70% followed by delayed occlusion,  2 stent were placed followed by PTA was performed. No intracranial bleed in the flat panel CTs x2 The patient tolerated the procedure well without incident and is transferred to ICU intubated in a stable condition.  PLAN: - Bed rest x6 hours post femoral puncture - SBP 120-140 mmHg -Cangrelor infusion per the protocol Aspirin 81 mg and Brilinta 90 mg   CT ANGIO HEAD NECK W WO CM W PERF (CODE STROKE)  Result Date: 09/25/2021 CLINICAL  DATA:  Right-sided weakness EXAM: CT ANGIOGRAPHY HEAD AND NECK TECHNIQUE: Multidetector CT imaging of the head and neck was performed using the standard protocol during bolus administration of intravenous contrast. Multiplanar CT image reconstructions and MIPs were obtained to evaluate the vascular anatomy. Carotid stenosis measurements (when applicable) are obtained utilizing NASCET criteria, using the distal internal carotid diameter as the denominator. RADIATION DOSE REDUCTION: This exam was performed according to the departmental dose-optimization program which includes automated exposure control, adjustment of the mA and/or kV according to patient size and/or use of iterative reconstruction technique. CONTRAST:  67m OMNIPAQUE IOHEXOL 350 MG/ML SOLN COMPARISON:  None Available. FINDINGS: CTA NECK FINDINGS Aortic arch: Normal arch.  Two vessel branching Right carotid system: Mixed density plaque mainly at the bifurcation without flow limiting stenosis or ulceration. Left carotid system: Mixed density plaque at the bifurcation. Proximal ICA occlusion. Vertebral arteries: No proximal subclavian stenosis. Suboptimal visualization of the vertebral arteries due to streak artifact and suboptimal bolus density. Suspect moderate narrowing at the right V1 segment. No beading or dissection seen. Skeleton: Ordinary cervical spine degeneration. Other neck: Subcutaneous varix in the left upper chest. Upper chest: Ground-glass opacity and interlobular septal thickening with airway cuffing in  the upper lungs. Review of the MIP images confirms the above findings CTA HEAD FINDINGS Anterior circulation: Atheromatous calcification of the carotid siphons. Left ICA reconstitution beginning at the ophthalmic segment. There is subsequent left MCA occlusion with abrupt cut off. No additional major branch occlusion is seen. Negative for aneurysm Posterior circulation: Dominant left vertebral artery. No major branch occlusion, beading, or aneurysm Venous sinuses: Patent. Small volume gas at the left cavernous sinus usually from IV access. Anatomic variants: None significant Review of the MIP images confirms the above findings Expected findings based on prior head CT which are relayed by chat. IMPRESSION: 1. Emergent large vessel occlusion at the left ICA origin. Short segment of intracranial ICA reconstitution with subsequent left MCA thrombosis. 2. Atherosclerosis. Suspect moderate narrowing at the right V1 segment. 3. Apical pulmonary opacity favoring edema Electronically Signed   By: JJorje GuildM.D.   On: 09/25/2021 05:24   CT HEAD CODE STROKE WO CONTRAST  Result Date: 09/25/2021 CLINICAL DATA:  Code stroke.  Right-sided weakness EXAM: CT HEAD WITHOUT CONTRAST TECHNIQUE: Contiguous axial images were obtained from the base of the skull through the vertex without intravenous contrast. RADIATION DOSE REDUCTION: This exam was performed according to the departmental dose-optimization program which includes automated exposure control, adjustment of the mA and/or kV according to patient size and/or use of iterative reconstruction technique. COMPARISON:  None Available. FINDINGS: Brain: No evidence of acute infarction, hemorrhage, hydrocephalus, extra-axial collection or mass lesion/mass effect. Vascular: Dense left MCA. Skull: Normal. Negative for fracture or focal lesion. Sinuses/Orbits: Negative Other: These results were communicated to Dr. KLorrin Goodellat 5:17 am on 09/25/2021, who is already aware. ASPECTS  ([facility] - Ganglionic level infarction (caudate, lentiform nuclei, internal capsule, insula, M1-M3 cortex): 7 - Supraganglionic infarction (M4-M6 cortex): 3 Total score (0-10 with 10 being normal): 10 IMPRESSION: 1. Dense left MCA consistent with thrombosis in the setting. 2. ASPECTS is 10.  No acute hemorrhage. Electronically Signed   By: JJorje GuildM.D.   On: 09/25/2021 05:18    Labs:  CBC: Recent Labs    09/25/21 0505 09/25/21 0510 09/25/21 1154 09/25/21 1806  WBC 14.2*  --   --   --   HGB 14.1 14.6 13.9 13.6  HCT 42.7  43.0 41.0 40.0  PLT 388  --   --   --      COAGS: Recent Labs    09/25/21 0505  INR 0.9  APTT 24     BMP: Recent Labs    09/25/21 0505 09/25/21 0510 09/25/21 1154 09/25/21 1806  NA 140 139 142 143  K 3.2* 3.6 3.4* 3.5  CL 105 102  --   --   CO2 24  --   --   --   GLUCOSE 135* 135*  --   --   BUN 10 13  --   --   CALCIUM 9.4  --   --   --   CREATININE 0.90 0.70  --   --   GFRNONAA >60  --   --   --      LIVER FUNCTION TESTS: Recent Labs    09/25/21 0505  BILITOT 0.3  AST 39  ALT 31  ALKPHOS 73  PROT 6.6  ALBUMIN 3.8     Assessment and Plan:  Code stroke =  Occlusion of the left ICA, Lt M1/MCA.   Mechanical thrombectomy performed with direct contact aspiration x1 with complete recanalization of the MCA(TICI 2b results).  Occlusion of the left distal M2/MCA, superior division, thrombectomy with contact aspiration and stentriever techniques.  Partially occluding thrombus in the distal A2 branches of left ACA, thrombectomy by the stentriever technique with TICI 2c results   Left carotid stenosis ~70% followed by delayed occlusion, 2 stent were placed followed by PTA was performed.   Patient extubated yesterday, not on sedation.  Not able to follow commands, minimal movement seen on right arm.  Cont ASA and Brilinta, further plan per neurology.    Electronically Signed: Tera Mater,  PA-C 09/27/2021, 11:38 AM    I spent a total of 15 Minutes at the the patient's bedside AND on the patient's hospital floor or unit, greater than 50% of which was counseling/coordinating care for cerebral intervention.

## 2021-09-27 NOTE — Progress Notes (Signed)
Initial Nutrition Assessment  DOCUMENTATION CODES:   Not applicable  INTERVENTION:   Initiate tube feeding via NG/Cortrak tube: Osmolite 1.5 at 50 ml/h (1200 ml per day)  Provides 1800 kcal, 90 gm protein, 912 ml free water daily   NUTRITION DIAGNOSIS:   Inadequate oral intake related to inability to eat as evidenced by NPO status.  GOAL:   Patient will meet greater than or equal to 90% of their needs  MONITOR:   TF tolerance  REASON FOR ASSESSMENT:   Consult Enteral/tube feeding initiation and management  ASSESSMENT:   Pt with PMH of HTN, tobacco abuse (2PPD), + THC, GERD, colon cancer, IBS, PCOS, skin ca, fibromyalgia, depression and anxiety admitted with dense L MCA stroke s/p thrombectomy.   Pt discussed during ICU rounds and with RN and MD.  Unable to participate in swallow eval today. Plan for cortrak Unable to answer any questions during exam.   8/30 extubated  Medications reviewed and include: colace, MVI with minerals, protonix, miralax NS @ 75 ml/hr    Labs reviewed:  TG: 243  UOP: 1250 ml   14 F NG; tip gastric antrum   NFPE  Flowsheet Row Most Recent Value  Orbital Region No depletion  Upper Arm Region No depletion  Thoracic and Lumbar Region No depletion  Buccal Region No depletion  Temple Region No depletion  Clavicle Bone Region No depletion  Clavicle and Acromion Bone Region No depletion  Scapular Bone Region No depletion  Dorsal Hand No depletion  Patellar Region No depletion  Anterior Thigh Region No depletion  Posterior Calf Region No depletion  Edema (RD Assessment) Mild  Hair Reviewed  Eyes Reviewed  Mouth Reviewed  Skin Reviewed  Nails Reviewed         Diet Order:   Diet Order             Diet NPO time specified  Diet effective now                   EDUCATION NEEDS:   No education needs have been identified at this time  Skin:  Skin Assessment: Reviewed RN Assessment  Last BM:  unknown  Height:    Ht Readings from Last 1 Encounters:  09/25/21 '5\' 8"'$  (1.727 m)    Weight:   Wt Readings from Last 1 Encounters:  09/25/21 124 kg   BMI:  Body mass index is 41.57 kg/m.  Estimated Nutritional Needs:   Kcal:  1800-2000  Protein:  90-105 grams  Fluid:  >1.8 L/day  Lockie Pares., RD, LDN, CNSC See AMiON for contact information

## 2021-09-28 ENCOUNTER — Other Ambulatory Visit (HOSPITAL_COMMUNITY): Payer: Self-pay | Admitting: Radiology

## 2021-09-28 DIAGNOSIS — I63512 Cerebral infarction due to unspecified occlusion or stenosis of left middle cerebral artery: Secondary | ICD-10-CM

## 2021-09-28 LAB — MAGNESIUM
Magnesium: 2.5 mg/dL — ABNORMAL HIGH (ref 1.7–2.4)
Magnesium: 2.5 mg/dL — ABNORMAL HIGH (ref 1.7–2.4)

## 2021-09-28 LAB — PHOSPHORUS
Phosphorus: 3.2 mg/dL (ref 2.5–4.6)
Phosphorus: 3.2 mg/dL (ref 2.5–4.6)

## 2021-09-28 LAB — GLUCOSE, CAPILLARY
Glucose-Capillary: 101 mg/dL — ABNORMAL HIGH (ref 70–99)
Glucose-Capillary: 110 mg/dL — ABNORMAL HIGH (ref 70–99)
Glucose-Capillary: 116 mg/dL — ABNORMAL HIGH (ref 70–99)
Glucose-Capillary: 120 mg/dL — ABNORMAL HIGH (ref 70–99)
Glucose-Capillary: 126 mg/dL — ABNORMAL HIGH (ref 70–99)
Glucose-Capillary: 127 mg/dL — ABNORMAL HIGH (ref 70–99)

## 2021-09-28 NOTE — Progress Notes (Signed)
Physical Therapy Treatment Patient Details Name: Erica Mcdaniel MRN: [id number] DOB: 1971-01-19 Today's Date: 09/28/2021   History of Present Illness Pt is a 51 y.o. female who presented 09/25/21 with R-sided weakness, aphasia, L gaze deviation, and R hemianopsia. Pt outside window for TNKase. MRI revealed large acute ischemic left MCA distribution infarct, small volume acute ischemic right ACA/MCA watershed distribution infarct, and small soft tissue contusion at the right frontotemporal scalp. S/p cerebral angiogram 8/29. ETT 8/29-8/30. PMH: GERD, HTN, IBS, polycistic ovarian syndrome, arthritis, morbid obesity, cancer, fibromyalgia, anxiety    PT Comments    Pt is demonstrating further improvement in visual tracking, maintaining midline gaze for longer periods of time and tracking a fair way to the R 1x. She continues to be non-verbal, pushes to her R, and demonstrates R lower extremity extensor tone. She was able to follow increased multi-modal commands with her L side today, rolling to the R with minA. However, she does not follow simple verbal commands, likely due to communication impairments. Focused session on flexing her R leg, trying to reduce her pushing syndrome to promote midline alignment sitting EOB, and sequencing functional mobility. Will continue to follow acutely. Current recommendations remain appropriate.     Recommendations for follow up therapy are one component of a multi-disciplinary discharge planning process, led by the attending physician.  Recommendations may be updated based on patient status, additional functional criteria and insurance authorization.  Follow Up Recommendations  Acute inpatient rehab (3hours/day)     Assistance Recommended at Discharge Frequent or constant Supervision/Assistance  Patient can return home with the following Two people to help with walking and/or transfers;Two people to help with bathing/dressing/bathroom;Assistance with  cooking/housework;Assistance with feeding;Direct supervision/assist for medications management;Direct supervision/assist for financial management;Assist for transportation;Help with stairs or ramp for entrance   Equipment Recommendations  Rolling walker (2 wheels);BSC/3in1;Wheelchair (measurements PT);Wheelchair cushion (measurements PT);Hospital bed;Other (comment) (hoyer equipment; pending progress)    Recommendations for Other Services       Precautions / Restrictions Precautions Precautions: Fall;Other (comment) Precaution Comments: watch SpO2; pushes to R; R leg extensor tone; L mitten; cortrak Restrictions Weight Bearing Restrictions: No     Mobility  Bed Mobility Overal bed mobility: Needs Assistance Bed Mobility: Rolling, Sidelying to Sit, Sit to Sidelying Rolling: Min assist, +2 for physical assistance, +2 for safety/equipment (to R) Sidelying to sit: Total assist, +2 for physical assistance, +2 for safety/equipment     Sit to sidelying: Total assist, +2 for physical assistance, +2 for safety/equipment General bed mobility comments: multimodal cueing to reach/push with L side, rolling onto R shoulder with min assist; total assist +2 for transition to/from EOB    Transfers                   General transfer comment: deferred for safety due to poor command following and lethargy    Ambulation/Gait               General Gait Details: deferred for safety due to poor command following and lethargy   Stairs             Wheelchair Mobility    Modified Rankin (Stroke Patients Only) Modified Rankin (Stroke Patients Only) Pre-Morbid Rankin Score: No symptoms Modified Rankin: Severe disability     Balance Overall balance assessment: Needs assistance Sitting-balance support: Single extremity supported, No upper extremity supported, Feet supported Sitting balance-Leahy Scale: Zero Sitting balance - Comments: EOB with max-total assist, pt with  preference to push with L hand  giving posterior and R lean; when on L elbow decreased assist required.  unable to correct with out physical assist. Postural control: Right lateral lean, Posterior lean     Standing balance comment: deferred for safety due to poor command following and lethargy                            Cognition Arousal/Alertness: Lethargic Behavior During Therapy: Flat affect Overall Cognitive Status: Difficult to assess Area of Impairment: Attention, Following commands, Safety/judgement, Awareness, Problem solving                   Current Attention Level: Focused   Following Commands: Follows one step commands inconsistently, Follows one step commands with increased time Safety/Judgement: Decreased awareness of safety, Decreased awareness of deficits Awareness: Intellectual Problem Solving: Slow processing, Decreased initiation, Difficulty sequencing, Requires verbal cues, Requires tactile cues General Comments: pt lethargic, opens eyes to commands but preference to keep eyes closed.  Able to engage using L side with multimodal cueing for automatic tasks but not following simple verbal commands.        Exercises Other Exercises Other Exercises: PROM to R ankle into dorsiflexion, R knee into flexion, and R hip into flexion; noted extensor tone; re-donned PRAFO end of session    General Comments General comments (skin integrity, edema, etc.): VSS on RA      Pertinent Vitals/Pain Pain Assessment Pain Assessment: Faces Faces Pain Scale: Hurts a little bit Pain Location: generalized Pain Descriptors / Indicators: Grimacing Pain Intervention(s): Limited activity within patient's tolerance, Monitored during session, Repositioned    Home Living                          Prior Function            PT Goals (current goals can now be found in the care plan section) Acute Rehab PT Goals Patient Stated Goal: did not state PT Goal  Formulation: Patient unable to participate in goal setting Time For Goal Achievement: 10/11/21 Potential to Achieve Goals: Fair Progress towards PT goals: Progressing toward goals    Frequency    Min 4X/week      PT Plan Current plan remains appropriate    Co-evaluation PT/OT/SLP Co-Evaluation/Treatment: Yes Reason for Co-Treatment: Complexity of the patient's impairments (multi-system involvement);Necessary to address cognition/behavior during functional activity;To address functional/ADL transfers;For patient/therapist safety PT goals addressed during session: Mobility/safety with mobility;Balance;Strengthening/ROM OT goals addressed during session: ADL's and self-care      AM-PAC PT "6 Clicks" Mobility   Outcome Measure  Help needed turning from your back to your side while in a flat bed without using bedrails?: A Little Help needed moving from lying on your back to sitting on the side of a flat bed without using bedrails?: Total Help needed moving to and from a bed to a chair (including a wheelchair)?: Total Help needed standing up from a chair using your arms (e.g., wheelchair or bedside chair)?: Total Help needed to walk in hospital room?: Total Help needed climbing 3-5 steps with a railing? : Total 6 Click Score: 8    End of Session Equipment Utilized During Treatment: Oxygen Activity Tolerance: Patient limited by lethargy Patient left: in bed;with call bell/phone within reach;with bed alarm set;with restraints reapplied Nurse Communication: Mobility status PT Visit Diagnosis: Difficulty in walking, not elsewhere classified (R26.2);Unsteadiness on feet (R26.81);Muscle weakness (generalized) (M62.81);Other symptoms and signs involving the nervous  system (R29.898);Hemiplegia and hemiparesis Hemiplegia - Right/Left: Right Hemiplegia - dominant/non-dominant: Dominant Hemiplegia - caused by: Cerebral infarction     Time: 0815-0839 PT Time Calculation (min) (ACUTE  ONLY): 24 min  Charges:  $Neuromuscular Re-education: 8-22 mins                     Moishe Spice, PT, DPT Acute Rehabilitation Services  Office: [phone]    Orvan Falconer 09/28/2021, 10:50 AM

## 2021-09-28 NOTE — Evaluation (Signed)
Clinical/Bedside Swallow Evaluation Patient Details  Name: Erica Mcdaniel MRN: [id number] Date of Birth: 1970/11/05  Today's Date: 09/28/2021 Time: SLP Start Time (ACUTE ONLY): 65 SLP Stop Time (ACUTE ONLY): 1138 SLP Time Calculation (min) (ACUTE ONLY): 8 min  Past Medical History:  Past Medical History:  Diagnosis Date   Anxiety    Arthritis    Cancer ([place])    skin squamous cell arm   Depression    Fibromyalgia    GERD (gastroesophageal reflux disease)    Hypertension    IBS (irritable bowel syndrome)    Pneumonia    hx   Polycystic ovarian disease    Tendonitis    Tubular adenoma of colon 2018   multiple   Past Surgical History:  Past Surgical History:  Procedure Laterality Date   APPENDECTOMY     BACK SURGERY     BACK SURGERY  2010   rods put in   IR ANGIO INTRA EXTRACRAN SEL COM CAROTID INNOMINATE UNI R MOD SED  09/25/2021   IR CT HEAD LTD  09/25/2021   IR CT HEAD LTD  09/25/2021   IR INTRAVSC STENT CERV CAROTID W/O EMB-PROT MOD SED INC ANGIO  09/25/2021   IR PERCUTANEOUS ART THROMBECTOMY/INFUSION INTRACRANIAL INC DIAG ANGIO  09/25/2021   IR US GUIDE VASC ACCESS LEFT  09/25/2021   IR US GUIDE VASC ACCESS RIGHT  09/25/2021   KNEE ARTHROSCOPY Left 13   NEUROPLASTY / TRANSPOSITION MEDIAN NERVE AT CARPAL TUNNEL BILATERAL     OVARIAN CYST SURGERY  1983   RADIOLOGY WITH ANESTHESIA N/A 09/25/2021   Procedure: IR WITH ANESTHESIA;  Surgeon: Luanne Bras, MD;  Location: [facility];  Service: Radiology;  Laterality: N/A;   ROTATOR CUFF REPAIR  2003,04   right, and elbow,left   TENDON RECONSTRUCTION Right 12/08/2012   Procedure: ELBOW TENDON RECONSTRUCTION/Right Lateral Epicondylar Debridement with Repair.;  Surgeon: Garald Balding, MD;  Location: [facility];  Service: Orthopedics;  Laterality: Right;  Right Lateral Epicondylar Debridement with Repair.   TRIGGER FINGER RELEASE Right 08   HPI:  Pt is a 51 y.o. female who presented 09/25/21 with R-sided weakness, aphasia, L gaze  deviation, and R hemianopsia. MRI brain revealed large acute ischemic left MCA distribution infarct, small volume acute ischemic right ACA/MCA watershed distribution infarct, and small soft tissue contusion at the right frontotemporal scalp. Mechanical thrombectomy was performed with MCA TICI 2b recanalization; occlusion of L distal M2/MCA and partial occlusion of distal A2 branches of L ACA noted with TICI 2c recanalization. Stent x 2 to L carotid artery placed. ETT 8/29-8/30. PMH: GERD, HTN, IBS, polycistic ovarian syndrome, arthritis, morbid obesity, cancer, fibromyalgia, anxiety    Assessment / Plan / Recommendation  Clinical Impression  Pt was seen for bedside swallow evaluation with her son present who denied the pt having a history of oropharyngeal dysphagia. The evaluation was limited due to pt's lethargy and she was unable to participate in a complete oral mechanism exam. Dentition was natural, but reduced. Pt exhibited impaired bolus awareness and bolus manipulation of ice chips was minimal and inconsistent. Ice chips ultimately either fell out of the pt's mouth or were removed due to the severity of pt's awareness. A single instance of swallowing trace liquids was noted without s/s of aspiration. It is recommended that the pt's NPO status be maintained; SLP will follow pt to assess improvement as alertness improves. SLP Visit Diagnosis: Dysphagia, unspecified (R13.10)    Aspiration Risk  Moderate aspiration risk  Diet Recommendation NPO;Alternative means - temporary   Medication Administration: Via alternative means    Other  Recommendations Oral Care Recommendations: Oral care QID    Recommendations for follow up therapy are one component of a multi-disciplinary discharge planning process, led by the attending physician.  Recommendations may be updated based on patient status, additional functional criteria and insurance authorization.  Follow up Recommendations Acute inpatient rehab  (3hours/day)      Assistance Recommended at Discharge Frequent or constant Supervision/Assistance  Functional Status Assessment Patient has had a recent decline in their functional status and demonstrates the ability to make significant improvements in function in a reasonable and predictable amount of time.  Frequency and Duration min 2x/week  2 weeks       Prognosis Prognosis for Safe Diet Advancement: Good Barriers to Reach Goals: Severity of deficits;Language deficits      Swallow Study   General Date of Onset: 09/26/21 HPI: Pt is a 51 y.o. female who presented 09/25/21 with R-sided weakness, aphasia, L gaze deviation, and R hemianopsia. MRI brain revealed large acute ischemic left MCA distribution infarct, small volume acute ischemic right ACA/MCA watershed distribution infarct, and small soft tissue contusion at the right frontotemporal scalp. Mechanical thrombectomy was performed with MCA TICI 2b recanalization; occlusion of L distal M2/MCA and partial occlusion of distal A2 branches of L ACA noted with TICI 2c recanalization. Stent x 2 to L carotid artery placed. ETT 8/29-8/30. PMH: GERD, HTN, IBS, polycistic ovarian syndrome, arthritis, morbid obesity, cancer, fibromyalgia, anxiety Type of Study: Bedside Swallow Evaluation Previous Swallow Assessment: none Diet Prior to this Study: NPO;NG Tube Temperature Spikes Noted: No Respiratory Status: Room air History of Recent Intubation: No Behavior/Cognition: Alert;Cooperative;Pleasant mood Oral Cavity Assessment: Within Functional Limits Oral Care Completed by SLP: No Oral Cavity - Dentition: Missing dentition Self-Feeding Abilities: Total assist Patient Positioning: Upright in bed;Postural control adequate for testing Baseline Vocal Quality: Not observed Volitional Cough: Cognitively unable to elicit Volitional Swallow: Unable to elicit    Oral/Motor/Sensory Function Overall Oral Motor/Sensory Function:  (UTA)   Ice Chips Ice  chips: Impaired Presentation: Spoon Oral Phase Impairments: Poor awareness of bolus Oral Phase Functional Implications:  (anterior loss)   Thin Liquid Thin Liquid: Not tested    Nectar Thick Nectar Thick Liquid: Not tested   Honey Thick Honey Thick Liquid: Not tested   Puree Puree: Not tested   Solid     Solid: Not tested     Rody Keadle I. Hardin Negus, [place], [place] Office number [phone]  Horton Marshall 09/28/2021,12:07 PM

## 2021-09-28 NOTE — Progress Notes (Signed)
Occupational Therapy Treatment Patient Details Name: Erica Mcdaniel MRN: [id number] DOB: 03-30-1970 Today's Date: 09/28/2021   History of present illness Pt is a 51 y.o. female who presented 09/25/21 with R-sided weakness, aphasia, L gaze deviation, and R hemianopsia. Pt outside window for TNKase. MRI revealed large acute ischemic left MCA distribution infarct, small volume acute ischemic right ACA/MCA watershed distribution infarct, and small soft tissue contusion at the right frontotemporal scalp. S/p cerebral angiogram 8/29. ETT 8/29-8/30. PMH: GERD, HTN, IBS, polycistic ovarian syndrome, arthritis, morbid obesity, cancer, fibromyalgia, anxiety   OT comments  Patient supine bed, engaged in OT/PT session.  Pt completing bed mobility with min assist +2 safety to roll towards R side given multimodal cueing to use L UE/LE to assist; total assist to fully ascend to EOB.  Sitting EOB continues to demonstrate pushing with L arm, max-total assist to maintain midline with no awareness of posterior/R push/lean.  Patient anxious EOB, limiting tolerance.  She keeps eyes closed as preference, opens to name- scans towards R side with cueing (midline multiple time, 80% to R x 1).  R arm remains flaccid, tone increasing in shoulder.  Will follow acutely, continue to recommend AIR.    Recommendations for follow up therapy are one component of a multi-disciplinary discharge planning process, led by the attending physician.  Recommendations may be updated based on patient status, additional functional criteria and insurance authorization.    Follow Up Recommendations  Acute inpatient rehab (3hours/day)    Assistance Recommended at Discharge Frequent or constant Supervision/Assistance  Patient can return home with the following  A lot of help with walking and/or transfers;Two people to help with walking and/or transfers;A lot of help with bathing/dressing/bathroom;Two people to help with  bathing/dressing/bathroom;Assistance with cooking/housework;Assistance with feeding;Direct supervision/assist for medications management;Assist for transportation;Direct supervision/assist for financial management;Help with stairs or ramp for entrance   Equipment Recommendations  Other (comment) (defer)    Recommendations for Other Services Rehab consult    Precautions / Restrictions Precautions Precautions: Fall;Other (comment) Precaution Comments: watch SpO2; pushes to R; R leg extensor tone Restrictions Weight Bearing Restrictions: No       Mobility Bed Mobility Overal bed mobility: Needs Assistance Bed Mobility: Rolling, Sidelying to Sit, Sit to Sidelying Rolling: Min assist, +2 for physical assistance, +2 for safety/equipment (to R) Sidelying to sit: Total assist, +2 for physical assistance, +2 for safety/equipment     Sit to sidelying: Total assist, +2 for physical assistance, +2 for safety/equipment General bed mobility comments: multimodal cueing to reach/push with L side, rolling onto R shoulder with min assist; total assist +2 for transition to/from EOB    Transfers                   General transfer comment: deferred for safety due to poor command following and lethargy     Balance Overall balance assessment: Needs assistance Sitting-balance support: Single extremity supported, No upper extremity supported, Feet supported Sitting balance-Leahy Scale: Zero Sitting balance - Comments: EOB with max-total assist, pt with preference to push with L hand giving posterior and R lean; when on L elbow decreased assist required.  unable to correct with out physical assist. Postural control: Right lateral lean, Posterior lean                                 ADL either performed or assessed with clinical judgement   ADL Overall ADL's : Needs assistance/impaired  Grooming: Oral care;Wash/dry face;Wash/dry hands;Bed level;Total assistance Grooming  Details (indicate cue type and reason): total assist for washing face with L hand (hand over hand) and oral care with both hands (hand over hand L and full assist R)                               General ADL Comments: total A for all aspects    Extremity/Trunk Assessment              Vision   Vision Assessment?: No apparent visual deficits Additional Comments: L gaze, able to come to midline frequently with max cueing and scan 80% to R with max cueing x 1.   Perception     Praxis      Cognition Arousal/Alertness: Lethargic Behavior During Therapy: Flat affect Overall Cognitive Status: Difficult to assess Area of Impairment: Attention, Following commands, Safety/judgement, Awareness, Problem solving                   Current Attention Level: Focused   Following Commands: Follows one step commands inconsistently, Follows one step commands with increased time Safety/Judgement: Decreased awareness of safety, Decreased awareness of deficits Awareness: Intellectual Problem Solving: Slow processing, Decreased initiation, Difficulty sequencing, Requires verbal cues, Requires tactile cues General Comments: pt lethargic, opens eyes to commands but preference to keep eyes closed.  Able to engage using L side with multimodal cueing for automatic tasks but not following simple verbal commands.        Exercises Exercises: Other exercises Other Exercises Other Exercises: PROM to R UE from shoulder to hand; flaccid with mild tone noted in shoulder    Shoulder Instructions       General Comments VSS on RA    Pertinent Vitals/ Pain       Pain Assessment Pain Assessment: Faces Faces Pain Scale: Hurts a little bit Pain Location: generalized Pain Descriptors / Indicators: Grimacing Pain Intervention(s): Limited activity within patient's tolerance, Monitored during session, Repositioned  Home Living                                           Prior Functioning/Environment              Frequency  Min 2X/week        Progress Toward Goals  OT Goals(current goals can now be found in the care plan section)  Progress towards OT goals: Progressing toward goals  Acute Rehab OT Goals Patient Stated Goal: unable to state OT Goal Formulation: Patient unable to participate in goal setting Time For Goal Achievement: 10/10/21 Potential to Achieve Goals: Good  Plan Discharge plan remains appropriate;Frequency remains appropriate    Co-evaluation    PT/OT/SLP Co-Evaluation/Treatment: Yes Reason for Co-Treatment: Complexity of the patient's impairments (multi-system involvement)   OT goals addressed during session: ADL's and self-care      AM-PAC OT "6 Clicks" Daily Activity     Outcome Measure   Help from another person eating meals?: Total Help from another person taking care of personal grooming?: Total Help from another person toileting, which includes using toliet, bedpan, or urinal?: Total Help from another person bathing (including washing, rinsing, drying)?: Total Help from another person to put on and taking off regular upper body clothing?: Total Help from another person to put on and taking off regular lower  body clothing?: Total 6 Click Score: 6    End of Session    OT Visit Diagnosis: Unsteadiness on feet (R26.81);Other abnormalities of gait and mobility (R26.89);Repeated falls (R29.6);History of falling (Z91.81);Pain   Activity Tolerance Patient tolerated treatment well   Patient Left in bed;with call bell/phone within reach;with bed alarm set   Nurse Communication Mobility status        Time: 1587-2761 OT Time Calculation (min): 24 min  Charges: OT General Charges $OT Visit: 1 Visit OT Treatments $Self Care/Home Management : 8-22 mins  Jolaine Artist, OT Acute Rehabilitation Services Office [phone]   Delight Stare 09/28/2021, 9:33 AM

## 2021-09-28 NOTE — Progress Notes (Signed)
RT NOTE:  Pt unable to wear CPAP tonight due to mental status. Pt is stable.

## 2021-09-28 NOTE — Progress Notes (Signed)
Referring Physician(s): Dr. Nira Retort  Supervising Physician: Katherina Right Lyla Glassing  Patient Status:  [facility] - In-pt  Chief Complaint:  left ICA occlusion. S/p NIR mechanical thrombectomy of left ICA, left M1/MCA (superior division) and A 3/left ACA and TICI 2C.  Subjective:  Unable to assess due to patient condition  Allergies: Clarithromycin  Medications: Prior to Admission medications   Medication Sig Start Date End Date Taking? Authorizing Provider  busPIRone (BUSPAR) 15 MG tablet Take 15 mg by mouth 2 (two) times daily as needed for anxiety. 08/06/21  Yes [provider]  celecoxib (CELEBREX) 200 MG capsule Take 200 mg by mouth 2 (two) times daily. 09/16/21  Yes [provider]  cetirizine (ZYRTEC) 10 MG tablet Take 10 mg by mouth daily.   Yes [provider]  Cyanocobalamin (VITAMIN B12 PO) Take 1 tablet by mouth daily.   Yes [provider]  cyclobenzaprine (FLEXERIL) 10 MG tablet Take 1 tablet (10 mg total) by mouth 3 (three) times daily as needed for muscle spasms. Patient taking differently: Take 10 mg by mouth daily as needed for muscle spasms. 08/08/21  Yes Persons, Bevely Palmer, PA  dicyclomine (BENTYL) 10 MG capsule Take 10 mg by mouth 2 (two) times daily.   Yes [provider]  DULoxetine (CYMBALTA) 60 MG capsule Take 120 mg by mouth daily.   Yes [provider]  esomeprazole (NEXIUM) 40 MG capsule Take 1 capsule (40 mg total) by mouth 2 (two) times daily before a meal. Patient taking differently: Take 40 mg by mouth daily. 04/15/16  Yes Pyrtle, Lajuan Lines, MD  ezetimibe (ZETIA) 10 MG tablet Take 10 mg by mouth daily. 05/10/21  Yes [provider]  hydrochlorothiazide (HYDRODIURIL) 25 MG tablet Take 25 mg by mouth daily.   Yes [provider]  lisinopril (ZESTRIL) 10 MG tablet Take 10 mg by mouth daily. 05/20/21  Yes [provider]  Multiple Vitamins-Minerals (MULTIVITAMIN WITH MINERALS)  tablet Take 1 tablet by mouth daily.   Yes [provider]  simvastatin (ZOCOR) 10 MG tablet Take by mouth. 05/10/21  Yes [provider]  traMADol (ULTRAM) 50 MG tablet Take 1 tablet (50 mg total) by mouth 3 (three) times daily as needed (1-2 tabs PO TID PRN). 11/10/17  Yes Garald Balding, MD  Vitamin D, Ergocalciferol, (DRISDOL) 50000 UNITS CAPS Take 50,000 Units by mouth every 7 (seven) days. Once a week   Yes [provider]     Vital Signs: BP 132/83   Pulse 83   Temp 99.6 F (37.6 C) (Axillary)   Resp 20   Ht '5\' 8"'  (1.727 m)   Wt 273 lb 5.9 oz (124 kg)   SpO2 99%   BMI 41.57 kg/m   Physical Exam Vitals and nursing note reviewed.  Constitutional:      Appearance: She is well-developed.  HENT:     Head: Normocephalic and atraumatic.  Eyes:     Conjunctiva/sclera: Conjunctivae normal.  Cardiovascular:     Comments: Right groin access site is soft with no active bleeding and no appreciable pseudoaneurysm. Dressing is C/D/I  Pulmonary:     Effort: Pulmonary effort is normal.  Musculoskeletal:        General: Signs of injury (right knee bruise) present.     Cervical back: Normal range of motion.  Neurological:     Mental Status: She is alert.     Comments: Alert No speech  Unable to follow commands Right side  neglect.          Imaging: DG Abd Portable 1V  Result Date: 09/27/2021 CLINICAL DATA:  Evaluate feeding tube placement. EXAM: PORTABLE ABDOMEN - 1 VIEW COMPARISON:  09/26/2021 FINDINGS: A feeding tube is identified with tip below the GE junction projecting over the expected location of the gastric antrum. No dilated bowel loops identified. Visualized lung bases appear clear. IMPRESSION: Feeding tube tip projects over the gastric antrum. Electronically Signed   By: Kerby Moors M.D.   On: 09/27/2021 11:13   DG Abd Portable 1V  Result Date: 09/26/2021 CLINICAL DATA:  OG tube EXAM: PORTABLE ABDOMEN - 1 VIEW COMPARISON:   Abdominal x-ray 09/26/2021 FINDINGS: The tip of the orogastric tube is at the level of the gastric antrum. No dilated bowel loops are seen. Lumbar fusion hardware is partially visualized. IMPRESSION: Orogastric tube tip at the level of the gastric antrum. Electronically Signed   By: Ronney Asters M.D.   On: 09/26/2021 23:18   DG Abd Portable 1V  Result Date: 09/26/2021 CLINICAL DATA:  Check gastric catheter placement EXAM: PORTABLE ABDOMEN - 1 VIEW COMPARISON:  09/25/2021 FINDINGS: Gastric catheter is noted within the stomach. Proximal side port however lies in the distal esophagus. This should be advanced deeper into the stomach. IMPRESSION: Gastric catheter as described. This should be advanced deeper into the stomach. Electronically Signed   By: Inez Catalina M.D.   On: 09/26/2021 22:03   MR BRAIN WO CONTRAST  Result Date: 09/26/2021 CLINICAL DATA:  Follow-up examination for acute stroke, left ICA occlusion, status post mechanical thrombectomy. EXAM: MRI HEAD WITHOUT CONTRAST MRA HEAD WITHOUT CONTRAST TECHNIQUE: Multiplanar, multi-echo pulse sequences of the brain and surrounding structures were acquired without intravenous contrast. Angiographic images of the Circle of Willis were acquired using MRA technique without intravenous contrast. COMPARISON:  Prior studies from 09/25/2021. FINDINGS: MRI HEAD FINDINGS Brain: Cerebral volume within normal limits. No significant cerebral white matter disease for age. Fairly sizable area of restricted diffusion involving the left frontal, parietal, and occipital lobes, consistent with acute left MCA distribution infarct. Involvement is most pronounced about the cortical gray matter, although fairly confluent involvement of the left caudate and lentiform nuclei noted as well. Additionally, patchy small volume acute right ACA and/or ACA/MCA watershed distribution infarcts seen within the contralateral right frontal lobe (series 5, image 95). Additional area of  restricted diffusion involving the anterior right temporal pole noted as well (series 5, image 68). While this could reflect an additional site of ischemia, possible cortical contusion could also be considered. No associated hemorrhage or significant regional mass effect about these infarcts. Otherwise, gray-white matter differentiation maintained. No areas of underlying chronic cortical infarction. No convincing or visible acute or chronic intracranial blood products. No mass lesion or midline shift. Ventricles normal size without hydrocephalus. No convincing extra-axial collection. Note made of a somewhat linear density overlying the right parietal convexity on coronal DWI sequence (series 7, image 47), favored to correspond with a prominent cortical vein. Pituitary gland and suprasellar region within normal limits. Vascular: Major intracranial vascular flow voids are maintained. Skull and upper cervical spine: Craniocervical junction within normal limits. Bone marrow signal intensity somewhat diffusely decreased on T1 weighted imaging, nonspecific, but most commonly related to anemia, smoking, or obesity. No focal marrow replacing lesion. Probable small soft tissue contusion at the right frontotemporal scalp (series 5, image 86). Sinuses/Orbits: Globes orbital soft tissues within normal limits. Moderate mucosal thickening present throughout the paranasal sinuses. Small bilateral mastoid effusions noted.  Patient is intubated. Other: None. MRA HEAD FINDINGS Anterior circulation: Visualized distal cervical segments of the internal carotid arteries are patent with antegrade flow. Focal flow defect at the distal cervical left ICA noted (series 1, image 48). Finding could be artifactual as no discrete stenosis is seen at this location on prior arteriogram. Possible focus of residual and/or recurrent thrombus with associated moderate approximate 50% stenosis is difficult to exclude. Petrous segments patent bilaterally.  Right ICA widely patent through the siphon without stenosis or other abnormality. Asymmetric irregularity seen throughout the left carotid siphon, most pronounced on 3D time-of-flight sequence (series 1, image 86). While this finding could be atherosclerotic in nature, a degree of vasospasm could be considered given the recent intervention. No high-grade stenosis. A1 segments patent bilaterally. Normal anterior communicating artery complex. Both ACAs are widely patent to their distal aspects. M1 segments are now both patent bilaterally. No proximal MCA branch occlusion. Distal MCA branches well perfused bilaterally. Increased prominence of the distal left MCA branches as compared to the right, likely reflecting a degree of luxury reperfusion given the underlying ischemic changes and revascularization. Posterior circulation: Left vertebral artery strongly dominant and widely patent to the vertebrobasilar junction. Left PICA patent. Right vertebral artery markedly hypoplastic but patent without visible stenosis. Partially visualized right PICA patent as well. Basilar patent to its distal aspect without stenosis. Superior cerebral arteries patent bilaterally. Both PCAs primarily supplied via the basilar. PCAs widely patent to their distal aspects without proximal stenosis. Anatomic variants: As above.  No intracranial aneurysm. IMPRESSION: MRI HEAD IMPRESSION: 1. Large evolving acute ischemic left MCA distribution infarct as above. No associated hemorrhage or significant regional mass effect. 2. Additional small volume acute ischemic nonhemorrhagic right ACA and/or ACA/MCA watershed distribution infarct. 3. Small area of restricted diffusion involving the right anterior temporal pole. While this finding may reflect an additional site of ischemia, a possible cortical contusion could also be considered given the presumed history of trauma. No associated hemorrhage. 4. Small soft tissue contusion at the right  frontotemporal scalp. MRA HEAD IMPRESSION: 1. Interval revascularization of previously seen left ICA/MCA occlusion. Asymmetric prominence of distal left MCA branches, consistent with a degree of luxury perfusion status post revascularization. 2. Asymmetric irregularity about the left carotid siphon, indeterminate. While this finding could be atherosclerotic in nature, a degree of vasospasm could also be present given the recent intervention. 3. Focal flow defect at the distal cervical left ICA as above. While this finding could be artifactual in nature as no discrete stenosis is seen at this location on prior arteriogram, a possible focus of residual and/or recurrent partially occlusive thrombus is difficult to exclude. Associated moderate approximate 50% stenosis at this level. 4. Otherwise wide patency of the major intracranial arterial vasculature. Electronically Signed   By: Jeannine Boga M.D.   On: 09/26/2021 03:12   MR ANGIO HEAD WO CONTRAST  Result Date: 09/26/2021 CLINICAL DATA:  Follow-up examination for acute stroke, left ICA occlusion, status post mechanical thrombectomy. EXAM: MRI HEAD WITHOUT CONTRAST MRA HEAD WITHOUT CONTRAST TECHNIQUE: Multiplanar, multi-echo pulse sequences of the brain and surrounding structures were acquired without intravenous contrast. Angiographic images of the Circle of Willis were acquired using MRA technique without intravenous contrast. COMPARISON:  Prior studies from 09/25/2021. FINDINGS: MRI HEAD FINDINGS Brain: Cerebral volume within normal limits. No significant cerebral white matter disease for age. Fairly sizable area of restricted diffusion involving the left frontal, parietal, and occipital lobes, consistent with acute left MCA distribution infarct. Involvement is most  pronounced about the cortical gray matter, although fairly confluent involvement of the left caudate and lentiform nuclei noted as well. Additionally, patchy small volume acute right ACA  and/or ACA/MCA watershed distribution infarcts seen within the contralateral right frontal lobe (series 5, image 95). Additional area of restricted diffusion involving the anterior right temporal pole noted as well (series 5, image 68). While this could reflect an additional site of ischemia, possible cortical contusion could also be considered. No associated hemorrhage or significant regional mass effect about these infarcts. Otherwise, gray-white matter differentiation maintained. No areas of underlying chronic cortical infarction. No convincing or visible acute or chronic intracranial blood products. No mass lesion or midline shift. Ventricles normal size without hydrocephalus. No convincing extra-axial collection. Note made of a somewhat linear density overlying the right parietal convexity on coronal DWI sequence (series 7, image 47), favored to correspond with a prominent cortical vein. Pituitary gland and suprasellar region within normal limits. Vascular: Major intracranial vascular flow voids are maintained. Skull and upper cervical spine: Craniocervical junction within normal limits. Bone marrow signal intensity somewhat diffusely decreased on T1 weighted imaging, nonspecific, but most commonly related to anemia, smoking, or obesity. No focal marrow replacing lesion. Probable small soft tissue contusion at the right frontotemporal scalp (series 5, image 86). Sinuses/Orbits: Globes orbital soft tissues within normal limits. Moderate mucosal thickening present throughout the paranasal sinuses. Small bilateral mastoid effusions noted. Patient is intubated. Other: None. MRA HEAD FINDINGS Anterior circulation: Visualized distal cervical segments of the internal carotid arteries are patent with antegrade flow. Focal flow defect at the distal cervical left ICA noted (series 1, image 48). Finding could be artifactual as no discrete stenosis is seen at this location on prior arteriogram. Possible focus of residual  and/or recurrent thrombus with associated moderate approximate 50% stenosis is difficult to exclude. Petrous segments patent bilaterally. Right ICA widely patent through the siphon without stenosis or other abnormality. Asymmetric irregularity seen throughout the left carotid siphon, most pronounced on 3D time-of-flight sequence (series 1, image 86). While this finding could be atherosclerotic in nature, a degree of vasospasm could be considered given the recent intervention. No high-grade stenosis. A1 segments patent bilaterally. Normal anterior communicating artery complex. Both ACAs are widely patent to their distal aspects. M1 segments are now both patent bilaterally. No proximal MCA branch occlusion. Distal MCA branches well perfused bilaterally. Increased prominence of the distal left MCA branches as compared to the right, likely reflecting a degree of luxury reperfusion given the underlying ischemic changes and revascularization. Posterior circulation: Left vertebral artery strongly dominant and widely patent to the vertebrobasilar junction. Left PICA patent. Right vertebral artery markedly hypoplastic but patent without visible stenosis. Partially visualized right PICA patent as well. Basilar patent to its distal aspect without stenosis. Superior cerebral arteries patent bilaterally. Both PCAs primarily supplied via the basilar. PCAs widely patent to their distal aspects without proximal stenosis. Anatomic variants: As above.  No intracranial aneurysm. IMPRESSION: MRI HEAD IMPRESSION: 1. Large evolving acute ischemic left MCA distribution infarct as above. No associated hemorrhage or significant regional mass effect. 2. Additional small volume acute ischemic nonhemorrhagic right ACA and/or ACA/MCA watershed distribution infarct. 3. Small area of restricted diffusion involving the right anterior temporal pole. While this finding may reflect an additional site of ischemia, a possible cortical contusion could  also be considered given the presumed history of trauma. No associated hemorrhage. 4. Small soft tissue contusion at the right frontotemporal scalp. MRA HEAD IMPRESSION: 1. Interval revascularization of previously seen  left ICA/MCA occlusion. Asymmetric prominence of distal left MCA branches, consistent with a degree of luxury perfusion status post revascularization. 2. Asymmetric irregularity about the left carotid siphon, indeterminate. While this finding could be atherosclerotic in nature, a degree of vasospasm could also be present given the recent intervention. 3. Focal flow defect at the distal cervical left ICA as above. While this finding could be artifactual in nature as no discrete stenosis is seen at this location on prior arteriogram, a possible focus of residual and/or recurrent partially occlusive thrombus is difficult to exclude. Associated moderate approximate 50% stenosis at this level. 4. Otherwise wide patency of the major intracranial arterial vasculature. Electronically Signed   By: Jeannine Boga M.D.   On: 09/26/2021 03:12   ECHOCARDIOGRAM COMPLETE  Result Date: 09/25/2021    ECHOCARDIOGRAM REPORT   Patient Name:   SHARI NATT Date of Exam: 09/25/2021 Medical Rec #:  [id number]           Height:       68.0 in Accession #:    [id number]          Weight:       273.4 lb Date of Birth:  Jun 03, 1970           BSA:          2.334 m Patient Age:    45 years            BP:           122/6 mmHg Patient Gender: F                   HR:           70 bpm. Exam Location:  Inpatient Procedure: 2D Echo, Cardiac Doppler and Color Doppler Indications:    Stroke  History:        Patient has no prior history of Echocardiogram examinations.                 Risk Factors:Hypertension and Current Smoker. GERD.  Sonographer:    Clayton Lefort RDCS (AE) Referring Phys: [id number] [facility]  Sonographer Comments: Patient is obese and echo performed with patient supine and on artificial respirator. Image  acquisition challenging due to patient body habitus. IMPRESSIONS  1. Left ventricular ejection fraction, by estimation, is 65 to 70%. The left ventricle has normal function. The left ventricle has no regional wall motion abnormalities. There is mild left ventricular hypertrophy. Left ventricular diastolic parameters were normal.  2. Right ventricular systolic function is hyperdynamic. The right ventricular size is normal. Tricuspid regurgitation signal is inadequate for assessing PA pressure.  3. The mitral valve is normal in structure. No evidence of mitral valve regurgitation.  4. The aortic valve was not well visualized. There is mild calcification of the aortic valve. Aortic valve regurgitation is not visualized. Aortic valve sclerosis/calcification is present, without any evidence of aortic stenosis.  5. The inferior vena cava is normal in size with greater than 50% respiratory variability, suggesting right atrial pressure of 3 mmHg. Comparison(s): No prior Echocardiogram. FINDINGS  Left Ventricle: Left ventricular ejection fraction, by estimation, is 65 to 70%. The left ventricle has normal function. The left ventricle has no regional wall motion abnormalities. The left ventricular internal cavity size was normal in size. There is  mild left ventricular hypertrophy. Left ventricular diastolic parameters were normal. Right Ventricle: The right ventricular size is normal. No increase in right ventricular wall thickness. Right ventricular systolic function is hyperdynamic.  Tricuspid regurgitation signal is inadequate for assessing PA pressure. Left Atrium: Left atrial size was normal in size. Right Atrium: Right atrial size was normal in size. Pericardium: There is no evidence of pericardial effusion. Mitral Valve: The mitral valve is normal in structure. No evidence of mitral valve regurgitation. MV peak gradient, 6.2 mmHg. The mean mitral valve gradient is 3.0 mmHg. Tricuspid Valve: The tricuspid valve is  normal in structure. Tricuspid valve regurgitation is not demonstrated. No evidence of tricuspid stenosis. Aortic Valve: The aortic valve was not well visualized. There is mild calcification of the aortic valve. Aortic valve regurgitation is not visualized. Aortic valve sclerosis/calcification is present, without any evidence of aortic stenosis. Aortic valve mean gradient measures 9.0 mmHg. Aortic valve peak gradient measures 16.5 mmHg. Aortic valve area, by VTI measures 2.33 cm. Pulmonic Valve: The pulmonic valve was normal in structure. Pulmonic valve regurgitation is not visualized. No evidence of pulmonic stenosis. Aorta: The aortic root, ascending aorta and aortic arch are all structurally normal, with no evidence of dilitation or obstruction. Venous: The inferior vena cava is normal in size with greater than 50% respiratory variability, suggesting right atrial pressure of 3 mmHg. IAS/Shunts: No atrial level shunt detected by color flow Doppler.  LEFT VENTRICLE PLAX 2D LVIDd:         4.50 cm   Diastology LVIDs:         3.00 cm   LV e' medial:    9.90 cm/s LV PW:         1.20 cm   LV E/e' medial:  11.8 LV IVS:        0.80 cm   LV e' lateral:   9.90 cm/s LVOT diam:     2.00 cm   LV E/e' lateral: 11.8 LV SV:         88 LV SV Index:   38 LVOT Area:     3.14 cm  RIGHT VENTRICLE             IVC RV Basal diam:  3.00 cm     IVC diam: 1.90 cm RV S prime:     18.10 cm/s TAPSE (M-mode): 3.0 cm LEFT ATRIUM           Index        RIGHT ATRIUM           Index LA diam:      3.00 cm 1.29 cm/m   RA Area:     11.70 cm LA Vol (A2C): 31.4 ml 13.45 ml/m  RA Volume:   25.20 ml  10.80 ml/m LA Vol (A4C): 55.2 ml 23.65 ml/m  AORTIC VALVE AV Area (Vmax):    2.27 cm AV Area (Vmean):   2.11 cm AV Area (VTI):     2.33 cm AV Vmax:           203.00 cm/s AV Vmean:          140.000 cm/s AV VTI:            0.379 m AV Peak Grad:      16.5 mmHg AV Mean Grad:      9.0 mmHg LVOT Vmax:         147.00 cm/s LVOT Vmean:        94.200 cm/s  LVOT VTI:          0.281 m LVOT/AV VTI ratio: 0.74  AORTA Ao Root diam: 2.90 cm Ao Asc diam:  3.00 cm MITRAL VALVE MV Area (PHT): 2.91  cm     SHUNTS MV Area VTI:   2.51 cm     Systemic VTI:  0.28 m MV Peak grad:  6.2 mmHg     Systemic Diam: 2.00 cm MV Mean grad:  3.0 mmHg MV Vmax:       1.25 m/s MV Vmean:      88.6 cm/s MV Decel Time: 261 msec MV E velocity: 117.00 cm/s MV A velocity: 115.00 cm/s MV E/A ratio:  1.02 Rudean Haskell MD Electronically signed by Rudean Haskell MD Signature Date/Time: 09/25/2021/6:15:04 PM    Final    VAS US CAROTID  Result Date: 09/25/2021 Carotid Arterial Duplex Study Patient Name:  DORREEN VALIENTE  Date of Exam:   09/25/2021 Medical Rec #: [id number]            Accession #:    [id number] Date of Birth: 06-28-1970            Patient Gender: F Patient Age:   69 years Exam Location:  [facility] Procedure:      VAS US CAROTID Referring Phys: Janine Ores --------------------------------------------------------------------------------  Indications:       Left stent and Carotid stenosis. Risk Factors:      Hypertension. Other Factors:     Lt stent done 09/25/21. Limitations        Today's exam was limited due to patient on a ventilator and                    patient positioning. Comparison Study:  no prior Performing Technologist: Archie Patten RVS  Examination Guidelines: A complete evaluation includes B-mode imaging, spectral Doppler, color Doppler, and power Doppler as needed of all accessible portions of each vessel. Bilateral testing is considered an integral part of a complete examination. Limited examinations for reoccurring indications may be performed as noted.  Right Carotid Findings: +----------+--------+--------+--------+------------------+--------+           PSV cm/sEDV cm/sStenosisPlaque DescriptionComments +----------+--------+--------+--------+------------------+--------+ CCA Prox  102     26              heterogenous                +----------+--------+--------+--------+------------------+--------+ CCA Distal71      22              heterogenous               +----------+--------+--------+--------+------------------+--------+ ICA Prox  85      27      1-39%   heterogenous               +----------+--------+--------+--------+------------------+--------+ ICA Distal98      38                                         +----------+--------+--------+--------+------------------+--------+ ECA       107     13                                         +----------+--------+--------+--------+------------------+--------+ +----------+--------+-------+--------+-------------------+           PSV cm/sEDV cmsDescribeArm Pressure (mmHG) +----------+--------+-------+--------+-------------------+ [id number]                                         +----------+--------+-------+--------+-------------------+ +---------+--------+--+--------+-+  VertebralPSV cm/s35EDV cm/s7 +---------+--------+--+--------+-+  Left Carotid Findings: +----------+--------+--------+--------+------------------+--------+           PSV cm/sEDV cm/sStenosisPlaque DescriptionComments +----------+--------+--------+--------+------------------+--------+ CCA Prox  84      17              heterogenous               +----------+--------+--------+--------+------------------+--------+ CCA Distal86      24              heterogenous               +----------+--------+--------+--------+------------------+--------+ ICA Distal174     54                                         +----------+--------+--------+--------+------------------+--------+ ECA       107     9                                          +----------+--------+--------+--------+------------------+--------+ +----------+--------+--------+--------+-------------------+           PSV cm/sEDV cm/sDescribeArm Pressure (mmHG)  +----------+--------+--------+--------+-------------------+ Subclavian118                                         +----------+--------+--------+--------+-------------------+ +---------+--------+--+--------+--+---------+ VertebralPSV cm/s42EDV cm/s11Antegrade +---------+--------+--+--------+--+---------+  Left Stent(s): +---------------+---+--+---------------+++ Prox to Stent  81 21                +---------------+---+--+---------------+++ Proximal Stent 78 26                +---------------+---+--+---------------+++ Mid Stent      63 26                +---------------+---+--+---------------+++ Distal Stent   [id number]% stenosis +---------------+---+--+---------------+++ Distal to [id number]                +---------------+---+--+---------------+++    Summary: Right Carotid: Velocities in the right ICA are consistent with a 1-39% stenosis. Left Carotid: Left distal ICA stent consistent with 50-75% stenosis. Vertebrals: Bilateral vertebral arteries demonstrate antegrade flow. *See table(s) above for measurements and observations.  Electronically signed by Antony Contras MD on 09/25/2021 at 4:36:13 PM.    Final    IR US Guide Vasc Access Left  Result Date: 09/25/2021 INDICATION: 51 year old female with past medical history significant for GERD, hypertension, IBS, polycystic ovarian syndrome, arthritis, morbid obesity was last seen normal by her son at 2000 hours on 09/24/2021. Son reports that heard a loud third in the morning around 3:50 a.m. and went up and found the patient on the floor, she was weak in her right side and was aphasic. The patient was brought to the ER by EMS. Last known well: 2000 hours on 09/24/2021 Her NIHSS: 25. MRSA: EXAM: ULTRASOUND-GUIDED VASCULAR ACCESS DIAGNOSTIC CEREBRAL ANGIOGRAM MECHANICAL THROMBECTOMY OF THE LEFT ICA, LEFT M1/MCA, DISTAL M2/MCA OF THE SUPERIOR DIVISION, A3 SEGMENT OF LEFT ACA CAROTID STENT PLACEMENT FOR OCCLUSION OF THE  LEFT CAROTID ARTERY FLAT PANEL HEAD CT X2 COMPARISON:  None Available. MEDICATIONS: Verapamil a total of 10 mg at 5 mg, and 2.5 mg increments Cangrelor 15 microgram/kg (patient weighs 114 kg) Aspirin 81 mg and Brilinta 180 mg through the orogastric tube Integrilin 3 mg ANESTHESIA/SEDATION:  General anesthesia CONTRAST:  240 mL of Omnipaque 300 FLUOROSCOPY TIME:  Radiation Exposure Index (as provided by the fluoroscopic device): 4156 mGy Kerma Fluoro time: 98 minute and 12 seconds COMPLICATIONS: None immediate. TECHNIQUE: Following a full explanation of the procedure along with the potential associated complications, an informed witnessed consent was obtained. The risks of intracranial hemorrhage of 10%, worsening neurological deficit, ventilator dependency, death and inability to revascularize were all reviewed in detail with the patient's son and family. The patient was then put under general anesthesia by the Department of Anesthesiology at [facility]. The left groin was prepped and draped in the usual sterile fashion. Ultrasound examination of the left groin was performed demonstrating widely patent left common femoral artery. Images were recorded and sent to PACs. Under ultrasound guidance and by using a micropuncture cyst, left common femoral artery was accessed 018 inch wire was advanced through the needle and the needle was exchanged for a 4 French microcatheter. Then, inner dilator was removed, 035 inch wire was introduced and 5 [place] sheath was placed. FINDINGS: Left common femoral artery is widely patent. Patent common femoral artery was recorded and sent to the PACS. PROCEDURE: Ultrasound-guided access into the left common femoral artery. IMPRESSION: Ultrasound-guided access into the left common femoral artery. Please refer to the detailed report in the previous dictation. PLAN: 1. Transfer to ICU. 2. Tab. Aspirin 81 mg and Tab. Brilinta 90 mg bid 3. BP goals of 120-140 mm Hg Electronically  Signed   By: Frazier Richards M.D.   On: 09/25/2021 14:34   IR PERCUTANEOUS ART THROMBECTOMY/INFUSION INTRACRANIAL INC DIAG ANGIO  Result Date: 09/25/2021 INDICATION: 51 year old female with past medical history significant for GERD, hypertension, IBS, polycystic ovarian syndrome, arthritis, morbid obesity was last seen normal by her son at 2000 hours on 09/24/2021. Son reports that heard a loud third in the morning around 3:50 a.m. and went up and found the patient on the floor, she was weak in her right side and was aphasic. The patient was brought to the ER by EMS. Last known well: 2000 hours on 09/24/2021 Her NIHSS: 25. MRSA: 0 EXAM: ULTRASOUND-GUIDED VASCULAR ACCESS DIAGNOSTIC CEREBRAL ANGIOGRAM MECHANICAL THROMBECTOMY OF THE LEFT ICA, LEFT M1/MCA, DISTAL M2/MCA OF THE SUPERIOR DIVISION, A3 SEGMENT OF LEFT ACA CAROTID STENT PLACEMENT FOR OCCLUSION OF THE LEFT CAROTID ARTERY FLAT PANEL HEAD CT X2 COMPARISON:  None Available. MEDICATIONS: Verapamil a total of 10 mg at 5 mg, and 2.5 mg increments Cangrelor 15 microgram/kg (patient weighs 114 kg) Aspirin 81 mg and  Brilinta 180 mg through the orogastric tube Integrilin 3 mg Performing surgeon: Dr. Frazier Richards Assistants: Dr. Karenann Cai and Dr. Luanne Bras ANESTHESIA/SEDATION: The procedure was performed under general anesthesia. CONTRAST:  240 mL of Omnipaque 300 milligram/mL FLUOROSCOPY: Radiation Exposure Index (as provided by the fluoroscopic device): 4156 mGy Kerma Fluoro time: 98 minute and 12 seconds COMPLICATIONS: None immediate. TECHNIQUE: Informed written consent was obtained from the patient's son after a thorough discussion of the procedural risks, benefits and alternatives. All questions were addressed. Maximal Sterile Barrier Technique was utilized including caps, mask, sterile gowns, sterile gloves, sterile drape, hand hygiene and skin antiseptic. A timeout was performed prior to the initiation of the procedure. The right groin was  prepped and draped in the usual sterile fashion. Using a micropuncture kit and the modified Seldinger technique, access was gained to the right common femoral artery and an 8 [place] by 25 cm sheath was placed. Real-time ultrasound guidance was  utilized for vascular access including the acquisition of a permanent ultrasound image documenting patency of the accessed vessel. Under fluoroscopy, a Zoom 88 guide catheter was navigated over a 6 [place] neuron select Berenstein catheter and a 0.035" Terumo Glidewire into the aortic arch. The catheter was placed into the left common carotid artery and biplane DSA carotid angiography was performed. Then, under roadmap guidance, the Glidewire was advanced through the segment of occlusion of the cervical ICA and was advanced into the cavernous aspect of the ICA. The neuron select catheter was advanced over the glidewire into the mid cervical ICA and the guide catheter was advanced over the neuron select catheter to place its tip at the petrous aspect of the ICA and biplane DSA cerebral angiogram was obtained. FINDINGS: 1. Normal caliber of the right common femoral artery, adequate for vascular access. 2. Occlusion of left cervical ICA at the origin. Biplane cerebral angiogram of petrous ICA injection demonstrated occlusion of proximal left M1-MCA. There are multiple non flow-limiting filling defects seen in the A2 and A3 segments of left ACA. 3. A-comm is patent with cross filling of the contralateral ACA. PROCEDURE: Using biplane roadmap, a zoom 071 aspiration catheter was navigated over an Aristotle 24 microguidewire into the cavernous segment of the left ICA. The Aristotle 24 micro guidewire and the aspiration catheter were then advanced to the level of occlusion at the left M1 MCA and connected to an aspiration pump. Continuous aspiration was performed for 2 minutes. The guide catheter was connected to a VacLok syringe. The aspiration catheter was subsequently removed under  constant aspiration. The guide catheter was aspirated for debris. Then, biplane DSA cerebral angiogram was performed by injecting contrast through the guide catheter which demonstrated complete recanalization of the inferior division of the left MCA. However, there was an occlusion of the branch of the superior division of the left MCA seen (mid-distal M2/MCA) (TICI 2b). There was also occlusion of the distal M3 branch of the superior division of the left MCA seen as well. Then, recanalization of the M2 branch of the MCA was planned. Under roadmap guidance, and using a Zoom 55 aspiration catheter as well as the Aristotle 14 microguidewire, occluded M2 branch was selected the wire was removed and the aspiration catheter was connected to an aspiration pump. Continuous aspiration was performed for 2 minutes. The guide catheter was connected to a VacLock syringe. The aspiration catheter was subsequently removed under constant aspiration. The guide catheter was aspirated for debris. Then, biplane DSA cerebral angiogram was performed by injecting contrast through the guide catheter which demonstrated persistent occlusion of the M2 division of the left MCA. Next, zoom 55 aspiration catheter, phenom 21 microcatheter and Aristotle 14 micro guidewire were selected and under roadmap guidance, the occluded M2 branch of the superior division was selected the microwire and catheter were advanced into the M2 segment, wire was removed, Solitaire X 3 mm x 2 cm was selected and the solitaire stent was deployed spanning the M2 segment. The microcatheter was removed and the aspiration catheter was advanced to the level of the occlusion and was connected to the aspiration pump. The device was allowed to intercalated with the clot for 3 minutes. After 3 minutes, the thrombectomy device and aspiration catheter were removed under constant aspiration. The guide catheter was aspirated for debris. Then, biplane DSA cerebral angiogram was  performed by injecting contrast through the guide catheter which demonstrated recanalization of the M2 division (TICI 2c results). However, there were clots seen in  the A2 and A3 segments of left ACA. Next, mechanical thrombectomy of the left A2 and A3 segments was planned. A Zoom 55, phenom 21 microcatheter and Arisotle 14 micro guidewire were selected and under roadmap guidance, microwire and microcatheter were navigated into the pericallosal branch of the left ACA. Next, microcatheter was removed. A 3 mm by 20 mm solitaire X stent was selected and was deployed spanning the A3 segment and extending into the distal A2 segment intercalated with the clot for 3 minutes. The microcatheter was removed. The zoom 55 aspiration catheter was advanced over the wire to the level of occlusion and connected to the aspiration pump. The thrombectomy device and aspiration catheter were removed under constant aspiration. The guide catheter was aspirated for debris. Then biplane DSA cerebral angiogram was performed by injecting contrast through the guide catheter which demonstrated T2C results. Next, the guide catheter was withdrawn with its distal end in the distal common carotid artery and biplane cerebral angiogram was performed demonstrating about 70% stenoses at the proximal aspect of the ICA with mild non flow-limiting dissection. At this point in time, carotid stent placement was planned. Flat panel CT of the head was performed which demonstrated no subarachnoid or intracranial bleed. There was some contrast staining seen at the head of the left caudate nucleus. At this point in time, orogastric tube was inserted and tablet aspirin 81 mg and Brilinta 180 mg was given. Cangrelor bolus infusion was given as well as per the protocol. Subsequently, a 021 Phenom microcatheter and Aristotle 14 micro guidewire were selected and were navigated into the cavernous ICA. The microwire was exchanged for an exchange length 014 zoom wire.  Next, under roadmap guidance, XACT stent [id number] was selected and was deployed in the proximal-mid cervical ICA. Another XACT [id number] stent was selected and was deployed overlapping the previous stent and extending into the distal common carotid artery. In view of residual 50-60% in-stent stenosis, a Viatrac 5 mm x 20 mm balloon was selected and angioplasty of the in-stent stenosis was performed. Next, post angioplasty carotid and cerebral angiogram was performed. There was minimal in-stent thrombus formation seen. For this, Integrilin a total of 3 mg was injected through the guide sheath. In view of mild irregularity and spasm at the left A1 segment, right carotid and cerebral angiogram was planned. Under ultrasound guidance and using a micropuncture system, the left common femoral artery was accessed and 5 [place] by 10 cm sheath was placed. Five French vertebral artery catheter was advanced over a 035 inch guidewire and selective catheterization of the right common carotid artery and in the internal carotid artery was performed and biplane cerebral angiogram was performed. Biplane DSA angiogram demonstrated intracranial ICA, anterior and middle cerebral artery and their branches to be widely patent. There is patent A-comm seen with excellent cross filling of the contralateral ACA. Venous aspect of the cerebral angiogram is unremarkable Next, vert catheter and the guide catheter were removed. Flat panel CT of the head was performed demonstrating no intracranial bleed. Bilateral femoral angiogram was performed of the right femoral access site was closed using an 8 French Angio-Seal and left femoral access site was closed using 6 [place] Angio-Seal. As the COVID test was not performed and also because of the large body habitus and other comorbid issues, patient was kept intubated and was transferred to the ICU in stable condition. IMPRESSION: 1. Successful mechanical thrombectomy for treatment of an occlusion of  the left ICA, left M1/MCA, M2/MCA (superior division), A2 and  A3/left ACA with TICI 2C results. Multiple thrombectomy passes performed to achieve this results. 2. Left carotid stents placement. 3. Right carotid and cerebral angiogram demonstrated intracranial right ICA, right anterior and middle cerebral arteries to have a normal appearance. A-comm is widely patent with excellent cross-filling of the left ACA. PLAN: 1. Transfer to ICU. 2. Tab.  Aspirin 81 mg and Tab. Brilinta 90 mg bid 3. BP goals of 120-140 mm Hg Electronically Signed   By: Frazier Richards M.D.   On: 09/25/2021 14:14   IR US Guide Vasc Access Right  Result Date: 09/25/2021 INDICATION: 51 year old female with past medical history significant for GERD, hypertension, IBS, polycystic ovarian syndrome, arthritis, morbid obesity was last seen normal by her son at 2000 hours on 09/24/2021. Son reports that heard a loud third in the morning around 3:50 a.m. and went up and found the patient on the floor, she was weak in her right side and was aphasic. The patient was brought to the ER by EMS. Last known well: 2000 hours on 09/24/2021 Her NIHSS: 25. MRSA: 0 EXAM: ULTRASOUND-GUIDED VASCULAR ACCESS DIAGNOSTIC CEREBRAL ANGIOGRAM MECHANICAL THROMBECTOMY OF THE LEFT ICA, LEFT M1/MCA, DISTAL M2/MCA OF THE SUPERIOR DIVISION, A3 SEGMENT OF LEFT ACA CAROTID STENT PLACEMENT FOR OCCLUSION OF THE LEFT CAROTID ARTERY FLAT PANEL HEAD CT X2 COMPARISON:  None Available. MEDICATIONS: Verapamil a total of 10 mg at 5 mg, and 2.5 mg increments Cangrelor 15 microgram/kg (patient weighs 114 kg) Aspirin 81 mg and  Brilinta 180 mg through the orogastric tube Integrilin 3 mg Performing surgeon: Dr. Frazier Richards Assistants: Dr. Karenann Cai and Dr. Luanne Bras ANESTHESIA/SEDATION: The procedure was performed under general anesthesia. CONTRAST:  240 mL of Omnipaque 300 milligram/mL FLUOROSCOPY: Radiation Exposure Index (as provided by the fluoroscopic device): 4156 mGy  Kerma Fluoro time: 98 minute and 12 seconds COMPLICATIONS: None immediate. TECHNIQUE: Informed written consent was obtained from the patient's son after a thorough discussion of the procedural risks, benefits and alternatives. All questions were addressed. Maximal Sterile Barrier Technique was utilized including caps, mask, sterile gowns, sterile gloves, sterile drape, hand hygiene and skin antiseptic. A timeout was performed prior to the initiation of the procedure. The right groin was prepped and draped in the usual sterile fashion. Using a micropuncture kit and the modified Seldinger technique, access was gained to the right common femoral artery and an 8 [place] by 25 cm sheath was placed. Real-time ultrasound guidance was utilized for vascular access including the acquisition of a permanent ultrasound image documenting patency of the accessed vessel. Under fluoroscopy, a Zoom 88 guide catheter was navigated over a 6 [place] neuron select Berenstein catheter and a 0.035" Terumo Glidewire into the aortic arch. The catheter was placed into the left common carotid artery and biplane DSA carotid angiography was performed. Then, under roadmap guidance, the Glidewire was advanced through the segment of occlusion of the cervical ICA and was advanced into the cavernous aspect of the ICA. The neuron select catheter was advanced over the glidewire into the mid cervical ICA and the guide catheter was advanced over the neuron select catheter to place its tip at the petrous aspect of the ICA and biplane DSA cerebral angiogram was obtained. FINDINGS: 1. Normal caliber of the right common femoral artery, adequate for vascular access. 2. Occlusion of left cervical ICA at the origin. Biplane cerebral angiogram of petrous ICA injection demonstrated occlusion of proximal left M1-MCA. There are multiple non flow-limiting filling defects seen in the A2 and A3  segments of left ACA. 3. A-comm is patent with cross filling of the  contralateral ACA. PROCEDURE: Using biplane roadmap, a zoom 071 aspiration catheter was navigated over an Aristotle 24 microguidewire into the cavernous segment of the left ICA. The Aristotle 24 micro guidewire and the aspiration catheter were then advanced to the level of occlusion at the left M1 MCA and connected to an aspiration pump. Continuous aspiration was performed for 2 minutes. The guide catheter was connected to a VacLok syringe. The aspiration catheter was subsequently removed under constant aspiration. The guide catheter was aspirated for debris. Then, biplane DSA cerebral angiogram was performed by injecting contrast through the guide catheter which demonstrated complete recanalization of the inferior division of the left MCA. However, there was an occlusion of the branch of the superior division of the left MCA seen (mid-distal M2/MCA) (TICI 2b). There was also occlusion of the distal M3 branch of the superior division of the left MCA seen as well. Then, recanalization of the M2 branch of the MCA was planned. Under roadmap guidance, and using a Zoom 55 aspiration catheter as well as the Aristotle 14 microguidewire, occluded M2 branch was selected the wire was removed and the aspiration catheter was connected to an aspiration pump. Continuous aspiration was performed for 2 minutes. The guide catheter was connected to a VacLock syringe. The aspiration catheter was subsequently removed under constant aspiration. The guide catheter was aspirated for debris. Then, biplane DSA cerebral angiogram was performed by injecting contrast through the guide catheter which demonstrated persistent occlusion of the M2 division of the left MCA. Next, zoom 55 aspiration catheter, phenom 21 microcatheter and Aristotle 14 micro guidewire were selected and under roadmap guidance, the occluded M2 branch of the superior division was selected the microwire and catheter were advanced into the M2 segment, wire was removed,  Solitaire X 3 mm x 2 cm was selected and the solitaire stent was deployed spanning the M2 segment. The microcatheter was removed and the aspiration catheter was advanced to the level of the occlusion and was connected to the aspiration pump. The device was allowed to intercalated with the clot for 3 minutes. After 3 minutes, the thrombectomy device and aspiration catheter were removed under constant aspiration. The guide catheter was aspirated for debris. Then, biplane DSA cerebral angiogram was performed by injecting contrast through the guide catheter which demonstrated recanalization of the M2 division (TICI 2c results). However, there were clots seen in the A2 and A3 segments of left ACA. Next, mechanical thrombectomy of the left A2 and A3 segments was planned. A Zoom 55, phenom 21 microcatheter and Arisotle 14 micro guidewire were selected and under roadmap guidance, microwire and microcatheter were navigated into the pericallosal branch of the left ACA. Next, microcatheter was removed. A 3 mm by 20 mm solitaire X stent was selected and was deployed spanning the A3 segment and extending into the distal A2 segment intercalated with the clot for 3 minutes. The microcatheter was removed. The zoom 55 aspiration catheter was advanced over the wire to the level of occlusion and connected to the aspiration pump. The thrombectomy device and aspiration catheter were removed under constant aspiration. The guide catheter was aspirated for debris. Then biplane DSA cerebral angiogram was performed by injecting contrast through the guide catheter which demonstrated T2C results. Next, the guide catheter was withdrawn with its distal end in the distal common carotid artery and biplane cerebral angiogram was performed demonstrating about 70% stenoses at the proximal aspect of the ICA  with mild non flow-limiting dissection. At this point in time, carotid stent placement was planned. Flat panel CT of the head was performed which  demonstrated no subarachnoid or intracranial bleed. There was some contrast staining seen at the head of the left caudate nucleus. At this point in time, orogastric tube was inserted and tablet aspirin 81 mg and Brilinta 180 mg was given. Cangrelor bolus infusion was given as well as per the protocol. Subsequently, a 021 Phenom microcatheter and Aristotle 14 micro guidewire were selected and were navigated into the cavernous ICA. The microwire was exchanged for an exchange length 014 zoom wire. Next, under roadmap guidance, XACT stent [id number] was selected and was deployed in the proximal-mid cervical ICA. Another XACT [id number] stent was selected and was deployed overlapping the previous stent and extending into the distal common carotid artery. In view of residual 50-60% in-stent stenosis, a Viatrac 5 mm x 20 mm balloon was selected and angioplasty of the in-stent stenosis was performed. Next, post angioplasty carotid and cerebral angiogram was performed. There was minimal in-stent thrombus formation seen. For this, Integrilin a total of 3 mg was injected through the guide sheath. In view of mild irregularity and spasm at the left A1 segment, right carotid and cerebral angiogram was planned. Under ultrasound guidance and using a micropuncture system, the left common femoral artery was accessed and 5 [place] by 10 cm sheath was placed. Five French vertebral artery catheter was advanced over a 035 inch guidewire and selective catheterization of the right common carotid artery and in the internal carotid artery was performed and biplane cerebral angiogram was performed. Biplane DSA angiogram demonstrated intracranial ICA, anterior and middle cerebral artery and their branches to be widely patent. There is patent A-comm seen with excellent cross filling of the contralateral ACA. Venous aspect of the cerebral angiogram is unremarkable Next, vert catheter and the guide catheter were removed. Flat panel CT of the head  was performed demonstrating no intracranial bleed. Bilateral femoral angiogram was performed of the right femoral access site was closed using an 8 French Angio-Seal and left femoral access site was closed using 6 [place] Angio-Seal. As the COVID test was not performed and also because of the large body habitus and other comorbid issues, patient was kept intubated and was transferred to the ICU in stable condition. IMPRESSION: 1. Successful mechanical thrombectomy for treatment of an occlusion of the left ICA, left M1/MCA, M2/MCA (superior division), A2 and A3/left ACA with TICI 2C results. Multiple thrombectomy passes performed to achieve this results. 2. Left carotid stents placement. 3. Right carotid and cerebral angiogram demonstrated intracranial right ICA, right anterior and middle cerebral arteries to have a normal appearance. A-comm is widely patent with excellent cross-filling of the left ACA. PLAN: 1. Transfer to ICU. 2. Tab.  Aspirin 81 mg and Tab. Brilinta 90 mg bid 3. BP goals of 120-140 mm Hg Electronically Signed   By: Frazier Richards M.D.   On: 09/25/2021 14:14   IR CT Head Ltd  Result Date: 09/25/2021 INDICATION: 51 year old female with past medical history significant for GERD, hypertension, IBS, polycystic ovarian syndrome, arthritis, morbid obesity was last seen normal by her son at 2000 hours on 09/24/2021. Son reports that heard a loud third in the morning around 3:50 a.m. and went up and found the patient on the floor, she was weak in her right side and was aphasic. The patient was brought to the ER by EMS. Last known well: 2000 hours on 09/24/2021 Her NIHSS:  25. MRSA: 0 EXAM: ULTRASOUND-GUIDED VASCULAR ACCESS DIAGNOSTIC CEREBRAL ANGIOGRAM MECHANICAL THROMBECTOMY OF THE LEFT ICA, LEFT M1/MCA, DISTAL M2/MCA OF THE SUPERIOR DIVISION, A3 SEGMENT OF LEFT ACA CAROTID STENT PLACEMENT FOR OCCLUSION OF THE LEFT CAROTID ARTERY FLAT PANEL HEAD CT X2 COMPARISON:  None Available. MEDICATIONS: Verapamil a  total of 10 mg at 5 mg, and 2.5 mg increments Cangrelor 15 microgram/kg (patient weighs 114 kg) Aspirin 81 mg and  Brilinta 180 mg through the orogastric tube Integrilin 3 mg Performing surgeon: Dr. Frazier Richards Assistants: Dr. Karenann Cai and Dr. Luanne Bras ANESTHESIA/SEDATION: The procedure was performed under general anesthesia. CONTRAST:  240 mL of Omnipaque 300 milligram/mL FLUOROSCOPY: Radiation Exposure Index (as provided by the fluoroscopic device): 4156 mGy Kerma Fluoro time: 98 minute and 12 seconds COMPLICATIONS: None immediate. TECHNIQUE: Informed written consent was obtained from the patient's son after a thorough discussion of the procedural risks, benefits and alternatives. All questions were addressed. Maximal Sterile Barrier Technique was utilized including caps, mask, sterile gowns, sterile gloves, sterile drape, hand hygiene and skin antiseptic. A timeout was performed prior to the initiation of the procedure. The right groin was prepped and draped in the usual sterile fashion. Using a micropuncture kit and the modified Seldinger technique, access was gained to the right common femoral artery and an 8 [place] by 25 cm sheath was placed. Real-time ultrasound guidance was utilized for vascular access including the acquisition of a permanent ultrasound image documenting patency of the accessed vessel. Under fluoroscopy, a Zoom 88 guide catheter was navigated over a 6 [place] neuron select Berenstein catheter and a 0.035" Terumo Glidewire into the aortic arch. The catheter was placed into the left common carotid artery and biplane DSA carotid angiography was performed. Then, under roadmap guidance, the Glidewire was advanced through the segment of occlusion of the cervical ICA and was advanced into the cavernous aspect of the ICA. The neuron select catheter was advanced over the glidewire into the mid cervical ICA and the guide catheter was advanced over the neuron select catheter to  place its tip at the petrous aspect of the ICA and biplane DSA cerebral angiogram was obtained. FINDINGS: 1. Normal caliber of the right common femoral artery, adequate for vascular access. 2. Occlusion of left cervical ICA at the origin. Biplane cerebral angiogram of petrous ICA injection demonstrated occlusion of proximal left M1-MCA. There are multiple non flow-limiting filling defects seen in the A2 and A3 segments of left ACA. 3. A-comm is patent with cross filling of the contralateral ACA. PROCEDURE: Using biplane roadmap, a zoom 071 aspiration catheter was navigated over an Aristotle 24 microguidewire into the cavernous segment of the left ICA. The Aristotle 24 micro guidewire and the aspiration catheter were then advanced to the level of occlusion at the left M1 MCA and connected to an aspiration pump. Continuous aspiration was performed for 2 minutes. The guide catheter was connected to a VacLok syringe. The aspiration catheter was subsequently removed under constant aspiration. The guide catheter was aspirated for debris. Then, biplane DSA cerebral angiogram was performed by injecting contrast through the guide catheter which demonstrated complete recanalization of the inferior division of the left MCA. However, there was an occlusion of the branch of the superior division of the left MCA seen (mid-distal M2/MCA) (TICI 2b). There was also occlusion of the distal M3 branch of the superior division of the left MCA seen as well. Then, recanalization of the M2 branch of the MCA was planned. Under roadmap guidance, and  using a Zoom 55 aspiration catheter as well as the Aristotle 14 microguidewire, occluded M2 branch was selected the wire was removed and the aspiration catheter was connected to an aspiration pump. Continuous aspiration was performed for 2 minutes. The guide catheter was connected to a VacLock syringe. The aspiration catheter was subsequently removed under constant aspiration. The guide catheter  was aspirated for debris. Then, biplane DSA cerebral angiogram was performed by injecting contrast through the guide catheter which demonstrated persistent occlusion of the M2 division of the left MCA. Next, zoom 55 aspiration catheter, phenom 21 microcatheter and Aristotle 14 micro guidewire were selected and under roadmap guidance, the occluded M2 branch of the superior division was selected the microwire and catheter were advanced into the M2 segment, wire was removed, Solitaire X 3 mm x 2 cm was selected and the solitaire stent was deployed spanning the M2 segment. The microcatheter was removed and the aspiration catheter was advanced to the level of the occlusion and was connected to the aspiration pump. The device was allowed to intercalated with the clot for 3 minutes. After 3 minutes, the thrombectomy device and aspiration catheter were removed under constant aspiration. The guide catheter was aspirated for debris. Then, biplane DSA cerebral angiogram was performed by injecting contrast through the guide catheter which demonstrated recanalization of the M2 division (TICI 2c results). However, there were clots seen in the A2 and A3 segments of left ACA. Next, mechanical thrombectomy of the left A2 and A3 segments was planned. A Zoom 55, phenom 21 microcatheter and Arisotle 14 micro guidewire were selected and under roadmap guidance, microwire and microcatheter were navigated into the pericallosal branch of the left ACA. Next, microcatheter was removed. A 3 mm by 20 mm solitaire X stent was selected and was deployed spanning the A3 segment and extending into the distal A2 segment intercalated with the clot for 3 minutes. The microcatheter was removed. The zoom 55 aspiration catheter was advanced over the wire to the level of occlusion and connected to the aspiration pump. The thrombectomy device and aspiration catheter were removed under constant aspiration. The guide catheter was aspirated for debris. Then  biplane DSA cerebral angiogram was performed by injecting contrast through the guide catheter which demonstrated T2C results. Next, the guide catheter was withdrawn with its distal end in the distal common carotid artery and biplane cerebral angiogram was performed demonstrating about 70% stenoses at the proximal aspect of the ICA with mild non flow-limiting dissection. At this point in time, carotid stent placement was planned. Flat panel CT of the head was performed which demonstrated no subarachnoid or intracranial bleed. There was some contrast staining seen at the head of the left caudate nucleus. At this point in time, orogastric tube was inserted and tablet aspirin 81 mg and Brilinta 180 mg was given. Cangrelor bolus infusion was given as well as per the protocol. Subsequently, a 021 Phenom microcatheter and Aristotle 14 micro guidewire were selected and were navigated into the cavernous ICA. The microwire was exchanged for an exchange length 014 zoom wire. Next, under roadmap guidance, XACT stent [id number] was selected and was deployed in the proximal-mid cervical ICA. Another XACT [id number] stent was selected and was deployed overlapping the previous stent and extending into the distal common carotid artery. In view of residual 50-60% in-stent stenosis, a Viatrac 5 mm x 20 mm balloon was selected and angioplasty of the in-stent stenosis was performed. Next, post angioplasty carotid and cerebral angiogram was performed. There was minimal in-stent  thrombus formation seen. For this, Integrilin a total of 3 mg was injected through the guide sheath. In view of mild irregularity and spasm at the left A1 segment, right carotid and cerebral angiogram was planned. Under ultrasound guidance and using a micropuncture system, the left common femoral artery was accessed and 5 [place] by 10 cm sheath was placed. Five French vertebral artery catheter was advanced over a 035 inch guidewire and selective catheterization of  the right common carotid artery and in the internal carotid artery was performed and biplane cerebral angiogram was performed. Biplane DSA angiogram demonstrated intracranial ICA, anterior and middle cerebral artery and their branches to be widely patent. There is patent A-comm seen with excellent cross filling of the contralateral ACA. Venous aspect of the cerebral angiogram is unremarkable Next, vert catheter and the guide catheter were removed. Flat panel CT of the head was performed demonstrating no intracranial bleed. Bilateral femoral angiogram was performed of the right femoral access site was closed using an 8 French Angio-Seal and left femoral access site was closed using 6 [place] Angio-Seal. As the COVID test was not performed and also because of the large body habitus and other comorbid issues, patient was kept intubated and was transferred to the ICU in stable condition. IMPRESSION: 1. Successful mechanical thrombectomy for treatment of an occlusion of the left ICA, left M1/MCA, M2/MCA (superior division), A2 and A3/left ACA with TICI 2C results. Multiple thrombectomy passes performed to achieve this results. 2. Left carotid stents placement. 3. Right carotid and cerebral angiogram demonstrated intracranial right ICA, right anterior and middle cerebral arteries to have a normal appearance. A-comm is widely patent with excellent cross-filling of the left ACA. PLAN: 1. Transfer to ICU. 2. Tab.  Aspirin 81 mg and Tab. Brilinta 90 mg bid 3. BP goals of 120-140 mm Hg Electronically Signed   By: Frazier Richards M.D.   On: 09/25/2021 14:14   IR CT Head Ltd  Result Date: 09/25/2021 INDICATION: 51 year old female with past medical history significant for GERD, hypertension, IBS, polycystic ovarian syndrome, arthritis, morbid obesity was last seen normal by her son at 2000 hours on 09/24/2021. Son reports that heard a loud third in the morning around 3:50 a.m. and went up and found the patient on the floor, she  was weak in her right side and was aphasic. The patient was brought to the ER by EMS. Last known well: 2000 hours on 09/24/2021 Her NIHSS: 25. MRSA: 0 EXAM: ULTRASOUND-GUIDED VASCULAR ACCESS DIAGNOSTIC CEREBRAL ANGIOGRAM MECHANICAL THROMBECTOMY OF THE LEFT ICA, LEFT M1/MCA, DISTAL M2/MCA OF THE SUPERIOR DIVISION, A3 SEGMENT OF LEFT ACA CAROTID STENT PLACEMENT FOR OCCLUSION OF THE LEFT CAROTID ARTERY FLAT PANEL HEAD CT X2 COMPARISON:  None Available. MEDICATIONS: Verapamil a total of 10 mg at 5 mg, and 2.5 mg increments Cangrelor 15 microgram/kg (patient weighs 114 kg) Aspirin 81 mg and  Brilinta 180 mg through the orogastric tube Integrilin 3 mg Performing surgeon: Dr. Frazier Richards Assistants: Dr. Karenann Cai and Dr. Luanne Bras ANESTHESIA/SEDATION: The procedure was performed under general anesthesia. CONTRAST:  240 mL of Omnipaque 300 milligram/mL FLUOROSCOPY: Radiation Exposure Index (as provided by the fluoroscopic device): 4156 mGy Kerma Fluoro time: 98 minute and 12 seconds COMPLICATIONS: None immediate. TECHNIQUE: Informed written consent was obtained from the patient's son after a thorough discussion of the procedural risks, benefits and alternatives. All questions were addressed. Maximal Sterile Barrier Technique was utilized including caps, mask, sterile gowns, sterile gloves, sterile drape, hand hygiene and skin  antiseptic. A timeout was performed prior to the initiation of the procedure. The right groin was prepped and draped in the usual sterile fashion. Using a micropuncture kit and the modified Seldinger technique, access was gained to the right common femoral artery and an 8 [place] by 25 cm sheath was placed. Real-time ultrasound guidance was utilized for vascular access including the acquisition of a permanent ultrasound image documenting patency of the accessed vessel. Under fluoroscopy, a Zoom 88 guide catheter was navigated over a 6 [place] neuron select Berenstein catheter and a  0.035" Terumo Glidewire into the aortic arch. The catheter was placed into the left common carotid artery and biplane DSA carotid angiography was performed. Then, under roadmap guidance, the Glidewire was advanced through the segment of occlusion of the cervical ICA and was advanced into the cavernous aspect of the ICA. The neuron select catheter was advanced over the glidewire into the mid cervical ICA and the guide catheter was advanced over the neuron select catheter to place its tip at the petrous aspect of the ICA and biplane DSA cerebral angiogram was obtained. FINDINGS: 1. Normal caliber of the right common femoral artery, adequate for vascular access. 2. Occlusion of left cervical ICA at the origin. Biplane cerebral angiogram of petrous ICA injection demonstrated occlusion of proximal left M1-MCA. There are multiple non flow-limiting filling defects seen in the A2 and A3 segments of left ACA. 3. A-comm is patent with cross filling of the contralateral ACA. PROCEDURE: Using biplane roadmap, a zoom 071 aspiration catheter was navigated over an Aristotle 24 microguidewire into the cavernous segment of the left ICA. The Aristotle 24 micro guidewire and the aspiration catheter were then advanced to the level of occlusion at the left M1 MCA and connected to an aspiration pump. Continuous aspiration was performed for 2 minutes. The guide catheter was connected to a VacLok syringe. The aspiration catheter was subsequently removed under constant aspiration. The guide catheter was aspirated for debris. Then, biplane DSA cerebral angiogram was performed by injecting contrast through the guide catheter which demonstrated complete recanalization of the inferior division of the left MCA. However, there was an occlusion of the branch of the superior division of the left MCA seen (mid-distal M2/MCA) (TICI 2b). There was also occlusion of the distal M3 branch of the superior division of the left MCA seen as well. Then,  recanalization of the M2 branch of the MCA was planned. Under roadmap guidance, and using a Zoom 55 aspiration catheter as well as the Aristotle 14 microguidewire, occluded M2 branch was selected the wire was removed and the aspiration catheter was connected to an aspiration pump. Continuous aspiration was performed for 2 minutes. The guide catheter was connected to a VacLock syringe. The aspiration catheter was subsequently removed under constant aspiration. The guide catheter was aspirated for debris. Then, biplane DSA cerebral angiogram was performed by injecting contrast through the guide catheter which demonstrated persistent occlusion of the M2 division of the left MCA. Next, zoom 55 aspiration catheter, phenom 21 microcatheter and Aristotle 14 micro guidewire were selected and under roadmap guidance, the occluded M2 branch of the superior division was selected the microwire and catheter were advanced into the M2 segment, wire was removed, Solitaire X 3 mm x 2 cm was selected and the solitaire stent was deployed spanning the M2 segment. The microcatheter was removed and the aspiration catheter was advanced to the level of the occlusion and was connected to the aspiration pump. The device was allowed to intercalated with  the clot for 3 minutes. After 3 minutes, the thrombectomy device and aspiration catheter were removed under constant aspiration. The guide catheter was aspirated for debris. Then, biplane DSA cerebral angiogram was performed by injecting contrast through the guide catheter which demonstrated recanalization of the M2 division (TICI 2c results). However, there were clots seen in the A2 and A3 segments of left ACA. Next, mechanical thrombectomy of the left A2 and A3 segments was planned. A Zoom 55, phenom 21 microcatheter and Arisotle 14 micro guidewire were selected and under roadmap guidance, microwire and microcatheter were navigated into the pericallosal branch of the left ACA. Next,  microcatheter was removed. A 3 mm by 20 mm solitaire X stent was selected and was deployed spanning the A3 segment and extending into the distal A2 segment intercalated with the clot for 3 minutes. The microcatheter was removed. The zoom 55 aspiration catheter was advanced over the wire to the level of occlusion and connected to the aspiration pump. The thrombectomy device and aspiration catheter were removed under constant aspiration. The guide catheter was aspirated for debris. Then biplane DSA cerebral angiogram was performed by injecting contrast through the guide catheter which demonstrated T2C results. Next, the guide catheter was withdrawn with its distal end in the distal common carotid artery and biplane cerebral angiogram was performed demonstrating about 70% stenoses at the proximal aspect of the ICA with mild non flow-limiting dissection. At this point in time, carotid stent placement was planned. Flat panel CT of the head was performed which demonstrated no subarachnoid or intracranial bleed. There was some contrast staining seen at the head of the left caudate nucleus. At this point in time, orogastric tube was inserted and tablet aspirin 81 mg and Brilinta 180 mg was given. Cangrelor bolus infusion was given as well as per the protocol. Subsequently, a 021 Phenom microcatheter and Aristotle 14 micro guidewire were selected and were navigated into the cavernous ICA. The microwire was exchanged for an exchange length 014 zoom wire. Next, under roadmap guidance, XACT stent [id number] was selected and was deployed in the proximal-mid cervical ICA. Another XACT [id number] stent was selected and was deployed overlapping the previous stent and extending into the distal common carotid artery. In view of residual 50-60% in-stent stenosis, a Viatrac 5 mm x 20 mm balloon was selected and angioplasty of the in-stent stenosis was performed. Next, post angioplasty carotid and cerebral angiogram was performed. There  was minimal in-stent thrombus formation seen. For this, Integrilin a total of 3 mg was injected through the guide sheath. In view of mild irregularity and spasm at the left A1 segment, right carotid and cerebral angiogram was planned. Under ultrasound guidance and using a micropuncture system, the left common femoral artery was accessed and 5 [place] by 10 cm sheath was placed. Five French vertebral artery catheter was advanced over a 035 inch guidewire and selective catheterization of the right common carotid artery and in the internal carotid artery was performed and biplane cerebral angiogram was performed. Biplane DSA angiogram demonstrated intracranial ICA, anterior and middle cerebral artery and their branches to be widely patent. There is patent A-comm seen with excellent cross filling of the contralateral ACA. Venous aspect of the cerebral angiogram is unremarkable Next, vert catheter and the guide catheter were removed. Flat panel CT of the head was performed demonstrating no intracranial bleed. Bilateral femoral angiogram was performed of the right femoral access site was closed using an 8 [place] Angio-Seal and left femoral access site was closed using  6 [place] Angio-Seal. As the COVID test was not performed and also because of the large body habitus and other comorbid issues, patient was kept intubated and was transferred to the ICU in stable condition. IMPRESSION: 1. Successful mechanical thrombectomy for treatment of an occlusion of the left ICA, left M1/MCA, M2/MCA (superior division), A2 and A3/left ACA with TICI 2C results. Multiple thrombectomy passes performed to achieve this results. 2. Left carotid stents placement. 3. Right carotid and cerebral angiogram demonstrated intracranial right ICA, right anterior and middle cerebral arteries to have a normal appearance. A-comm is widely patent with excellent cross-filling of the left ACA. PLAN: 1. Transfer to ICU. 2. Tab.  Aspirin 81 mg and Tab. Brilinta  90 mg bid 3. BP goals of 120-140 mm Hg Electronically Signed   By: Frazier Richards M.D.   On: 09/25/2021 14:14   IR ANGIO INTRA EXTRACRAN SEL COM CAROTID INNOMINATE UNI R MOD SED  Result Date: 09/25/2021 INDICATION: 51 year old female with past medical history significant for GERD, hypertension, IBS, polycystic ovarian syndrome, arthritis, morbid obesity was last seen normal by her son at 2000 hours on 09/24/2021. Son reports that heard a loud third in the morning around 3:50 a.m. and went up and found the patient on the floor, she was weak in her right side and was aphasic. The patient was brought to the ER by EMS. Last known well: 2000 hours on 09/24/2021 Her NIHSS: 25. MRSA: 0 EXAM: ULTRASOUND-GUIDED VASCULAR ACCESS DIAGNOSTIC CEREBRAL ANGIOGRAM MECHANICAL THROMBECTOMY OF THE LEFT ICA, LEFT M1/MCA, DISTAL M2/MCA OF THE SUPERIOR DIVISION, A3 SEGMENT OF LEFT ACA CAROTID STENT PLACEMENT FOR OCCLUSION OF THE LEFT CAROTID ARTERY FLAT PANEL HEAD CT X2 COMPARISON:  None Available. MEDICATIONS: Verapamil a total of 10 mg at 5 mg, and 2.5 mg increments Cangrelor 15 microgram/kg (patient weighs 114 kg) Aspirin 81 mg and  Brilinta 180 mg through the orogastric tube Integrilin 3 mg Performing surgeon: Dr. Frazier Richards Assistants: Dr. Karenann Cai and Dr. Luanne Bras ANESTHESIA/SEDATION: The procedure was performed under general anesthesia. CONTRAST:  240 mL of Omnipaque 300 milligram/mL FLUOROSCOPY: Radiation Exposure Index (as provided by the fluoroscopic device): 4156 mGy Kerma Fluoro time: 98 minute and 12 seconds COMPLICATIONS: None immediate. TECHNIQUE: Informed written consent was obtained from the patient's son after a thorough discussion of the procedural risks, benefits and alternatives. All questions were addressed. Maximal Sterile Barrier Technique was utilized including caps, mask, sterile gowns, sterile gloves, sterile drape, hand hygiene and skin antiseptic. A timeout was performed prior to the  initiation of the procedure. The right groin was prepped and draped in the usual sterile fashion. Using a micropuncture kit and the modified Seldinger technique, access was gained to the right common femoral artery and an 8 [place] by 25 cm sheath was placed. Real-time ultrasound guidance was utilized for vascular access including the acquisition of a permanent ultrasound image documenting patency of the accessed vessel. Under fluoroscopy, a Zoom 88 guide catheter was navigated over a 6 [place] neuron select Berenstein catheter and a 0.035" Terumo Glidewire into the aortic arch. The catheter was placed into the left common carotid artery and biplane DSA carotid angiography was performed. Then, under roadmap guidance, the Glidewire was advanced through the segment of occlusion of the cervical ICA and was advanced into the cavernous aspect of the ICA. The neuron select catheter was advanced over the glidewire into the mid cervical ICA and the guide catheter was advanced over the neuron select catheter to place its tip  at the petrous aspect of the ICA and biplane DSA cerebral angiogram was obtained. FINDINGS: 1. Normal caliber of the right common femoral artery, adequate for vascular access. 2. Occlusion of left cervical ICA at the origin. Biplane cerebral angiogram of petrous ICA injection demonstrated occlusion of proximal left M1-MCA. There are multiple non flow-limiting filling defects seen in the A2 and A3 segments of left ACA. 3. A-comm is patent with cross filling of the contralateral ACA. PROCEDURE: Using biplane roadmap, a zoom 071 aspiration catheter was navigated over an Aristotle 24 microguidewire into the cavernous segment of the left ICA. The Aristotle 24 micro guidewire and the aspiration catheter were then advanced to the level of occlusion at the left M1 MCA and connected to an aspiration pump. Continuous aspiration was performed for 2 minutes. The guide catheter was connected to a VacLok syringe. The  aspiration catheter was subsequently removed under constant aspiration. The guide catheter was aspirated for debris. Then, biplane DSA cerebral angiogram was performed by injecting contrast through the guide catheter which demonstrated complete recanalization of the inferior division of the left MCA. However, there was an occlusion of the branch of the superior division of the left MCA seen (mid-distal M2/MCA) (TICI 2b). There was also occlusion of the distal M3 branch of the superior division of the left MCA seen as well. Then, recanalization of the M2 branch of the MCA was planned. Under roadmap guidance, and using a Zoom 55 aspiration catheter as well as the Aristotle 14 microguidewire, occluded M2 branch was selected the wire was removed and the aspiration catheter was connected to an aspiration pump. Continuous aspiration was performed for 2 minutes. The guide catheter was connected to a VacLock syringe. The aspiration catheter was subsequently removed under constant aspiration. The guide catheter was aspirated for debris. Then, biplane DSA cerebral angiogram was performed by injecting contrast through the guide catheter which demonstrated persistent occlusion of the M2 division of the left MCA. Next, zoom 55 aspiration catheter, phenom 21 microcatheter and Aristotle 14 micro guidewire were selected and under roadmap guidance, the occluded M2 branch of the superior division was selected the microwire and catheter were advanced into the M2 segment, wire was removed, Solitaire X 3 mm x 2 cm was selected and the solitaire stent was deployed spanning the M2 segment. The microcatheter was removed and the aspiration catheter was advanced to the level of the occlusion and was connected to the aspiration pump. The device was allowed to intercalated with the clot for 3 minutes. After 3 minutes, the thrombectomy device and aspiration catheter were removed under constant aspiration. The guide catheter was aspirated for  debris. Then, biplane DSA cerebral angiogram was performed by injecting contrast through the guide catheter which demonstrated recanalization of the M2 division (TICI 2c results). However, there were clots seen in the A2 and A3 segments of left ACA. Next, mechanical thrombectomy of the left A2 and A3 segments was planned. A Zoom 55, phenom 21 microcatheter and Arisotle 14 micro guidewire were selected and under roadmap guidance, microwire and microcatheter were navigated into the pericallosal branch of the left ACA. Next, microcatheter was removed. A 3 mm by 20 mm solitaire X stent was selected and was deployed spanning the A3 segment and extending into the distal A2 segment intercalated with the clot for 3 minutes. The microcatheter was removed. The zoom 55 aspiration catheter was advanced over the wire to the level of occlusion and connected to the aspiration pump. The thrombectomy device and aspiration catheter  were removed under constant aspiration. The guide catheter was aspirated for debris. Then biplane DSA cerebral angiogram was performed by injecting contrast through the guide catheter which demonstrated T2C results. Next, the guide catheter was withdrawn with its distal end in the distal common carotid artery and biplane cerebral angiogram was performed demonstrating about 70% stenoses at the proximal aspect of the ICA with mild non flow-limiting dissection. At this point in time, carotid stent placement was planned. Flat panel CT of the head was performed which demonstrated no subarachnoid or intracranial bleed. There was some contrast staining seen at the head of the left caudate nucleus. At this point in time, orogastric tube was inserted and tablet aspirin 81 mg and Brilinta 180 mg was given. Cangrelor bolus infusion was given as well as per the protocol. Subsequently, a 021 Phenom microcatheter and Aristotle 14 micro guidewire were selected and were navigated into the cavernous ICA. The microwire was  exchanged for an exchange length 014 zoom wire. Next, under roadmap guidance, XACT stent [id number] was selected and was deployed in the proximal-mid cervical ICA. Another XACT [id number] stent was selected and was deployed overlapping the previous stent and extending into the distal common carotid artery. In view of residual 50-60% in-stent stenosis, a Viatrac 5 mm x 20 mm balloon was selected and angioplasty of the in-stent stenosis was performed. Next, post angioplasty carotid and cerebral angiogram was performed. There was minimal in-stent thrombus formation seen. For this, Integrilin a total of 3 mg was injected through the guide sheath. In view of mild irregularity and spasm at the left A1 segment, right carotid and cerebral angiogram was planned. Under ultrasound guidance and using a micropuncture system, the left common femoral artery was accessed and 5 [place] by 10 cm sheath was placed. Five French vertebral artery catheter was advanced over a 035 inch guidewire and selective catheterization of the right common carotid artery and in the internal carotid artery was performed and biplane cerebral angiogram was performed. Biplane DSA angiogram demonstrated intracranial ICA, anterior and middle cerebral artery and their branches to be widely patent. There is patent A-comm seen with excellent cross filling of the contralateral ACA. Venous aspect of the cerebral angiogram is unremarkable Next, vert catheter and the guide catheter were removed. Flat panel CT of the head was performed demonstrating no intracranial bleed. Bilateral femoral angiogram was performed of the right femoral access site was closed using an 8 French Angio-Seal and left femoral access site was closed using 6 [place] Angio-Seal. As the COVID test was not performed and also because of the large body habitus and other comorbid issues, patient was kept intubated and was transferred to the ICU in stable condition. IMPRESSION: 1. Successful  mechanical thrombectomy for treatment of an occlusion of the left ICA, left M1/MCA, M2/MCA (superior division), A2 and A3/left ACA with TICI 2C results. Multiple thrombectomy passes performed to achieve this results. 2. Left carotid stents placement. 3. Right carotid and cerebral angiogram demonstrated intracranial right ICA, right anterior and middle cerebral arteries to have a normal appearance. A-comm is widely patent with excellent cross-filling of the left ACA. PLAN: 1. Transfer to ICU. 2. Tab.  Aspirin 81 mg and Tab. Brilinta 90 mg bid 3. BP goals of 120-140 mm Hg Electronically Signed   By: Frazier Richards M.D.   On: 09/25/2021 14:14   IR INTRAVSC STENT CERV CAROTID W/O EMB-PROT MOD SED  Result Date: 09/25/2021 INDICATION: 51 year old female with past medical history significant for GERD, hypertension, IBS, polycystic  ovarian syndrome, arthritis, morbid obesity was last seen normal by her son at 2000 hours on 09/24/2021. Son reports that heard a loud third in the morning around 3:50 a.m. and went up and found the patient on the floor, she was weak in her right side and was aphasic. The patient was brought to the ER by EMS. Last known well: 2000 hours on 09/24/2021 Her NIHSS: 25. MRSA: 0 EXAM: ULTRASOUND-GUIDED VASCULAR ACCESS DIAGNOSTIC CEREBRAL ANGIOGRAM MECHANICAL THROMBECTOMY OF THE LEFT ICA, LEFT M1/MCA, DISTAL M2/MCA OF THE SUPERIOR DIVISION, A3 SEGMENT OF LEFT ACA CAROTID STENT PLACEMENT FOR OCCLUSION OF THE LEFT CAROTID ARTERY FLAT PANEL HEAD CT X2 COMPARISON:  None Available. MEDICATIONS: Verapamil a total of 10 mg at 5 mg, and 2.5 mg increments Cangrelor 15 microgram/kg (patient weighs 114 kg) Aspirin 81 mg and  Brilinta 180 mg through the orogastric tube Integrilin 3 mg Performing surgeon: Dr. Frazier Richards Assistants: Dr. Karenann Cai and Dr. Luanne Bras ANESTHESIA/SEDATION: The procedure was performed under general anesthesia. CONTRAST:  240 mL of Omnipaque 300 milligram/mL  FLUOROSCOPY: Radiation Exposure Index (as provided by the fluoroscopic device): 4156 mGy Kerma Fluoro time: 98 minute and 12 seconds COMPLICATIONS: None immediate. TECHNIQUE: Informed written consent was obtained from the patient's son after a thorough discussion of the procedural risks, benefits and alternatives. All questions were addressed. Maximal Sterile Barrier Technique was utilized including caps, mask, sterile gowns, sterile gloves, sterile drape, hand hygiene and skin antiseptic. A timeout was performed prior to the initiation of the procedure. The right groin was prepped and draped in the usual sterile fashion. Using a micropuncture kit and the modified Seldinger technique, access was gained to the right common femoral artery and an 8 [place] by 25 cm sheath was placed. Real-time ultrasound guidance was utilized for vascular access including the acquisition of a permanent ultrasound image documenting patency of the accessed vessel. Under fluoroscopy, a Zoom 88 guide catheter was navigated over a 6 [place] neuron select Berenstein catheter and a 0.035" Terumo Glidewire into the aortic arch. The catheter was placed into the left common carotid artery and biplane DSA carotid angiography was performed. Then, under roadmap guidance, the Glidewire was advanced through the segment of occlusion of the cervical ICA and was advanced into the cavernous aspect of the ICA. The neuron select catheter was advanced over the glidewire into the mid cervical ICA and the guide catheter was advanced over the neuron select catheter to place its tip at the petrous aspect of the ICA and biplane DSA cerebral angiogram was obtained. FINDINGS: 1. Normal caliber of the right common femoral artery, adequate for vascular access. 2. Occlusion of left cervical ICA at the origin. Biplane cerebral angiogram of petrous ICA injection demonstrated occlusion of proximal left M1-MCA. There are multiple non flow-limiting filling defects seen in  the A2 and A3 segments of left ACA. 3. A-comm is patent with cross filling of the contralateral ACA. PROCEDURE: Using biplane roadmap, a zoom 071 aspiration catheter was navigated over an Aristotle 24 microguidewire into the cavernous segment of the left ICA. The Aristotle 24 micro guidewire and the aspiration catheter were then advanced to the level of occlusion at the left M1 MCA and connected to an aspiration pump. Continuous aspiration was performed for 2 minutes. The guide catheter was connected to a VacLok syringe. The aspiration catheter was subsequently removed under constant aspiration. The guide catheter was aspirated for debris. Then, biplane DSA cerebral angiogram was performed by injecting contrast through the guide catheter  which demonstrated complete recanalization of the inferior division of the left MCA. However, there was an occlusion of the branch of the superior division of the left MCA seen (mid-distal M2/MCA) (TICI 2b). There was also occlusion of the distal M3 branch of the superior division of the left MCA seen as well. Then, recanalization of the M2 branch of the MCA was planned. Under roadmap guidance, and using a Zoom 55 aspiration catheter as well as the Aristotle 14 microguidewire, occluded M2 branch was selected the wire was removed and the aspiration catheter was connected to an aspiration pump. Continuous aspiration was performed for 2 minutes. The guide catheter was connected to a VacLock syringe. The aspiration catheter was subsequently removed under constant aspiration. The guide catheter was aspirated for debris. Then, biplane DSA cerebral angiogram was performed by injecting contrast through the guide catheter which demonstrated persistent occlusion of the M2 division of the left MCA. Next, zoom 55 aspiration catheter, phenom 21 microcatheter and Aristotle 14 micro guidewire were selected and under roadmap guidance, the occluded M2 branch of the superior division was selected the  microwire and catheter were advanced into the M2 segment, wire was removed, Solitaire X 3 mm x 2 cm was selected and the solitaire stent was deployed spanning the M2 segment. The microcatheter was removed and the aspiration catheter was advanced to the level of the occlusion and was connected to the aspiration pump. The device was allowed to intercalated with the clot for 3 minutes. After 3 minutes, the thrombectomy device and aspiration catheter were removed under constant aspiration. The guide catheter was aspirated for debris. Then, biplane DSA cerebral angiogram was performed by injecting contrast through the guide catheter which demonstrated recanalization of the M2 division (TICI 2c results). However, there were clots seen in the A2 and A3 segments of left ACA. Next, mechanical thrombectomy of the left A2 and A3 segments was planned. A Zoom 55, phenom 21 microcatheter and Arisotle 14 micro guidewire were selected and under roadmap guidance, microwire and microcatheter were navigated into the pericallosal branch of the left ACA. Next, microcatheter was removed. A 3 mm by 20 mm solitaire X stent was selected and was deployed spanning the A3 segment and extending into the distal A2 segment intercalated with the clot for 3 minutes. The microcatheter was removed. The zoom 55 aspiration catheter was advanced over the wire to the level of occlusion and connected to the aspiration pump. The thrombectomy device and aspiration catheter were removed under constant aspiration. The guide catheter was aspirated for debris. Then biplane DSA cerebral angiogram was performed by injecting contrast through the guide catheter which demonstrated T2C results. Next, the guide catheter was withdrawn with its distal end in the distal common carotid artery and biplane cerebral angiogram was performed demonstrating about 70% stenoses at the proximal aspect of the ICA with mild non flow-limiting dissection. At this point in time, carotid  stent placement was planned. Flat panel CT of the head was performed which demonstrated no subarachnoid or intracranial bleed. There was some contrast staining seen at the head of the left caudate nucleus. At this point in time, orogastric tube was inserted and tablet aspirin 81 mg and Brilinta 180 mg was given. Cangrelor bolus infusion was given as well as per the protocol. Subsequently, a 021 Phenom microcatheter and Aristotle 14 micro guidewire were selected and were navigated into the cavernous ICA. The microwire was exchanged for an exchange length 014 zoom wire. Next, under roadmap guidance, XACT stent [id number] was  selected and was deployed in the proximal-mid cervical ICA. Another XACT [id number] stent was selected and was deployed overlapping the previous stent and extending into the distal common carotid artery. In view of residual 50-60% in-stent stenosis, a Viatrac 5 mm x 20 mm balloon was selected and angioplasty of the in-stent stenosis was performed. Next, post angioplasty carotid and cerebral angiogram was performed. There was minimal in-stent thrombus formation seen. For this, Integrilin a total of 3 mg was injected through the guide sheath. In view of mild irregularity and spasm at the left A1 segment, right carotid and cerebral angiogram was planned. Under ultrasound guidance and using a micropuncture system, the left common femoral artery was accessed and 5 [place] by 10 cm sheath was placed. Five French vertebral artery catheter was advanced over a 035 inch guidewire and selective catheterization of the right common carotid artery and in the internal carotid artery was performed and biplane cerebral angiogram was performed. Biplane DSA angiogram demonstrated intracranial ICA, anterior and middle cerebral artery and their branches to be widely patent. There is patent A-comm seen with excellent cross filling of the contralateral ACA. Venous aspect of the cerebral angiogram is unremarkable Next,  vert catheter and the guide catheter were removed. Flat panel CT of the head was performed demonstrating no intracranial bleed. Bilateral femoral angiogram was performed of the right femoral access site was closed using an 8 [place] Angio-Seal and left femoral access site was closed using 6 [place] Angio-Seal. As the COVID test was not performed and also because of the large body habitus and other comorbid issues, patient was kept intubated and was transferred to the ICU in stable condition. IMPRESSION: 1. Successful mechanical thrombectomy for treatment of an occlusion of the left ICA, left M1/MCA, M2/MCA (superior division), A2 and A3/left ACA with TICI 2C results. Multiple thrombectomy passes performed to achieve this results. 2. Left carotid stents placement. 3. Right carotid and cerebral angiogram demonstrated intracranial right ICA, right anterior and middle cerebral arteries to have a normal appearance. A-comm is widely patent with excellent cross-filling of the left ACA. PLAN: 1. Transfer to ICU. 2. Tab.  Aspirin 81 mg and Tab. Brilinta 90 mg bid 3. BP goals of 120-140 mm Hg Electronically Signed   By: Frazier Richards M.D.   On: 09/25/2021 14:14   DG Abd Portable 1V  Result Date: 09/25/2021 CLINICAL DATA:  Insertion of endotracheal tube and orogastric tube EXAM: PORTABLE ABDOMEN - 1 VIEW COMPARISON:  Portable exam 1106 hours without priors for comparison FINDINGS: Tip of orogastric tube projects over proximal stomach. Subsegmental atelectasis LEFT lung base. Visualized bowel gas pattern normal. No osseous abnormalities. IMPRESSION: Tip of orogastric tube projects over proximal stomach. LEFT basilar atelectasis. Electronically Signed   By: Lavonia Dana M.D.   On: 09/25/2021 11:16   DG CHEST PORT 1 VIEW  Result Date: 09/25/2021 CLINICAL DATA:  Ventilator dependence. EXAM: PORTABLE CHEST 1 VIEW COMPARISON:  12/01/2012 FINDINGS: 1105 hours. Endotracheal tube tip is 4.4 cm above the base of the carina. The NG  tube passes into the stomach although the distal tip position is not included on the film. Low volume film with vascular congestion. Patchy airspace disease noted right upper lobe, left mid lung and left base. No substantial pleural effusion. Cardiopericardial silhouette is at upper limits of normal for size. Telemetry leads overlie the chest. IMPRESSION: 1. Endotracheal tube tip is 4.4 cm above the base of the carina. 2. Patchy airspace disease bilaterally compatible with asymmetric edema or multifocal  pneumonia. Electronically Signed   By: Misty Stanley M.D.   On: 09/25/2021 11:16   CT ANGIO HEAD NECK W WO CM W PERF (CODE STROKE)  Result Date: 09/25/2021 CLINICAL DATA:  Right-sided weakness EXAM: CT ANGIOGRAPHY HEAD AND NECK TECHNIQUE: Multidetector CT imaging of the head and neck was performed using the standard protocol during bolus administration of intravenous contrast. Multiplanar CT image reconstructions and MIPs were obtained to evaluate the vascular anatomy. Carotid stenosis measurements (when applicable) are obtained utilizing NASCET criteria, using the distal internal carotid diameter as the denominator. RADIATION DOSE REDUCTION: This exam was performed according to the departmental dose-optimization program which includes automated exposure control, adjustment of the mA and/or kV according to patient size and/or use of iterative reconstruction technique. CONTRAST:  40m OMNIPAQUE IOHEXOL 350 MG/ML SOLN COMPARISON:  None Available. FINDINGS: CTA NECK FINDINGS Aortic arch: Normal arch.  Two vessel branching Right carotid system: Mixed density plaque mainly at the bifurcation without flow limiting stenosis or ulceration. Left carotid system: Mixed density plaque at the bifurcation. Proximal ICA occlusion. Vertebral arteries: No proximal subclavian stenosis. Suboptimal visualization of the vertebral arteries due to streak artifact and suboptimal bolus density. Suspect moderate narrowing at the right V1  segment. No beading or dissection seen. Skeleton: Ordinary cervical spine degeneration. Other neck: Subcutaneous varix in the left upper chest. Upper chest: Ground-glass opacity and interlobular septal thickening with airway cuffing in the upper lungs. Review of the MIP images confirms the above findings CTA HEAD FINDINGS Anterior circulation: Atheromatous calcification of the carotid siphons. Left ICA reconstitution beginning at the ophthalmic segment. There is subsequent left MCA occlusion with abrupt cut off. No additional major branch occlusion is seen. Negative for aneurysm Posterior circulation: Dominant left vertebral artery. No major branch occlusion, beading, or aneurysm Venous sinuses: Patent. Small volume gas at the left cavernous sinus usually from IV access. Anatomic variants: None significant Review of the MIP images confirms the above findings Expected findings based on prior head CT which are relayed by chat. IMPRESSION: 1. Emergent large vessel occlusion at the left ICA origin. Short segment of intracranial ICA reconstitution with subsequent left MCA thrombosis. 2. Atherosclerosis. Suspect moderate narrowing at the right V1 segment. 3. Apical pulmonary opacity favoring edema Electronically Signed   By: JJorje GuildM.D.   On: 09/25/2021 05:24   CT HEAD CODE STROKE WO CONTRAST  Result Date: 09/25/2021 CLINICAL DATA:  Code stroke.  Right-sided weakness EXAM: CT HEAD WITHOUT CONTRAST TECHNIQUE: Contiguous axial images were obtained from the base of the skull through the vertex without intravenous contrast. RADIATION DOSE REDUCTION: This exam was performed according to the departmental dose-optimization program which includes automated exposure control, adjustment of the mA and/or kV according to patient size and/or use of iterative reconstruction technique. COMPARISON:  None Available. FINDINGS: Brain: No evidence of acute infarction, hemorrhage, hydrocephalus, extra-axial collection or mass  lesion/mass effect. Vascular: Dense left MCA. Skull: Normal. Negative for fracture or focal lesion. Sinuses/Orbits: Negative Other: These results were communicated to Dr. KLorrin Goodellat 5:17 am on 09/25/2021, who is already aware. ASPECTS ([facility] - Ganglionic level infarction (caudate, lentiform nuclei, internal capsule, insula, M1-M3 cortex): 7 - Supraganglionic infarction (M4-M6 cortex): 3 Total score (0-10 with 10 being normal): 10 IMPRESSION: 1. Dense left MCA consistent with thrombosis in the setting. 2. ASPECTS is 10.  No acute hemorrhage. Electronically Signed   By: JJorje GuildM.D.   On: 09/25/2021 05:18    Labs:  CBC: Recent  Labs    09/25/21 0505 09/25/21 0510 09/25/21 1154 09/25/21 1806  WBC 14.2*  --   --   --   HGB 14.1 14.6 13.9 13.6  HCT 42.7 43.0 41.0 40.0  PLT 388  --   --   --     COAGS: Recent Labs    09/25/21 0505  INR 0.9  APTT 24    BMP: Recent Labs    09/25/21 0505 09/25/21 0510 09/25/21 1154 09/25/21 1806  NA 140 139 142 143  K 3.2* 3.6 3.4* 3.5  CL 105 102  --   --   CO2 24  --   --   --   GLUCOSE 135* 135*  --   --   BUN 10 13  --   --   CALCIUM 9.4  --   --   --   CREATININE 0.90 0.70  --   --   GFRNONAA >60  --   --   --     LIVER FUNCTION TESTS: Recent Labs    09/25/21 0505  BILITOT 0.3  AST 39  ALT 31  ALKPHOS 73  PROT 6.6  ALBUMIN 3.8    Assessment and Plan:  51 y.o. female inpatient. Smoker. History of HTN, anxiety, fibromyalgia, GERD, IBS, SCC of the arm, polycystic ovarian disease, morbid obesity.  Presented to the ED at [facility] as Code Stroke after being found on the floor at 3:50am in the morning about walking the halls. Found to have a right sided weakness and aphasia. Found to have a left ICA occlusion. S/p NIR Intervention with Dr. Gerhard Perches on 8.29.21 with mechanical thrombectomy of left ICA, left M1/MCA (superior division) and A 3/left ACA and TICI 2C.  Per note from Dr. Leonie Man dated 8.31.23  Patient is seen in her room with no family at the bedside.  She has been hemodynamically stable and tolerating extubation well and her neurological exam remains stable.  Will plan to start tube feedings today, resume home meds and transfer out of the ICU.   Patient seen at Dr. Raliegh Ip. Karenann Cai. Right sided neglect with no speech noted. Patient unable to follow commands.   Orders placed for US carotid for 3 months time. NIR will continue to follow along - plans per Neurology   Electronically Signed: Jacqualine Mau, NP 09/28/2021, 1:58 PM   I spent a total of 15 Minutes at the the patient's bedside AND on the patient's hospital floor or unit, greater than 50% of which was counseling/coordinating care for left ICA occlusion. S/p NIR mechanical thrombectomy of left ICA, left M1/MCA (superior division) and A 3/left ACA and TICI 2C.

## 2021-09-28 NOTE — Evaluation (Signed)
Speech Language Pathology Evaluation Patient Details Name: IDALYS KONECNY MRN: [id number] DOB: 1970/09/03 Today's Date: 09/28/2021 Time: 3419-3790 SLP Time Calculation (min) (ACUTE ONLY): 10 min  Problem List:  Patient Active Problem List   Diagnosis Date Noted   Acute ischemic left MCA stroke ([place]) 09/25/2021   S/P TKR (total knee replacement) 08/30/2020   Pain in left knee 07/28/2018   Unilateral primary osteoarthritis, left knee 07/28/2018   Lateral epicondylitis of right elbow 12/08/2012   Ventral hernia 11/02/2010   Abnormal intestinal absorption  11/02/2010   Abdominal pain 11/02/2010   Fatigue 11/02/2010   Personal history of colonic polyps 11/02/2010   GERD (gastroesophageal reflux disease) 11/02/2010   GERD 08/06/2007   IBS 08/06/2007   ABDOMINAL PAIN-RUQ 08/06/2007   OBESITY 08/05/2007   ANXIETY 08/05/2007   SOMATIZATION DISORDER 08/05/2007   OVARIAN CYST 08/05/2007   PMS 08/05/2007   DYSFUNCTIONAL UTERINE BLEEDING 08/05/2007   FEMALE INFERTILITY 08/05/2007   DEGENERATIVE JOINT DISEASE 08/05/2007   OSTEOARTHRITIS, KNEES, BILATERAL 08/05/2007   KNEE PAIN, BILATERAL 08/05/2007   DEGENERATIVE DISC DISEASE 08/05/2007   LOW BACK PAIN, CHRONIC 08/05/2007   FIBROMYALGIA 08/05/2007   INSOMNIA 08/05/2007   PAP SMEAR, ABNORMAL 08/05/2007   Past Medical History:  Past Medical History:  Diagnosis Date   Anxiety    Arthritis    Cancer ([place])    skin squamous cell arm   Depression    Fibromyalgia    GERD (gastroesophageal reflux disease)    Hypertension    IBS (irritable bowel syndrome)    Pneumonia    hx   Polycystic ovarian disease    Tendonitis    Tubular adenoma of colon 2018   multiple   Past Surgical History:  Past Surgical History:  Procedure Laterality Date   APPENDECTOMY     BACK SURGERY     BACK SURGERY  2010   rods put in   IR ANGIO INTRA EXTRACRAN SEL COM CAROTID INNOMINATE UNI R MOD SED  09/25/2021   IR CT HEAD LTD  09/25/2021   IR CT HEAD  LTD  09/25/2021   IR INTRAVSC STENT CERV CAROTID W/O EMB-PROT MOD SED INC ANGIO  09/25/2021   IR PERCUTANEOUS ART THROMBECTOMY/INFUSION INTRACRANIAL INC DIAG ANGIO  09/25/2021   IR US GUIDE VASC ACCESS LEFT  09/25/2021   IR US GUIDE VASC ACCESS RIGHT  09/25/2021   KNEE ARTHROSCOPY Left 13   NEUROPLASTY / TRANSPOSITION MEDIAN NERVE AT CARPAL TUNNEL BILATERAL     OVARIAN CYST SURGERY  1983   RADIOLOGY WITH ANESTHESIA N/A 09/25/2021   Procedure: IR WITH ANESTHESIA;  Surgeon: Luanne Bras, MD;  Location: [place];  Service: Radiology;  Laterality: N/A;   ROTATOR CUFF REPAIR  2003,04   right, and elbow,left   TENDON RECONSTRUCTION Right 12/08/2012   Procedure: ELBOW TENDON RECONSTRUCTION/Right Lateral Epicondylar Debridement with Repair.;  Surgeon: Garald Balding, MD;  Location: [place];  Service: Orthopedics;  Laterality: Right;  Right Lateral Epicondylar Debridement with Repair.   TRIGGER FINGER RELEASE Right 08   HPI:  Pt is a 51 y.o. female who presented 09/25/21 with R-sided weakness, aphasia, L gaze deviation, and R hemianopsia. MRI brain revealed large acute ischemic left MCA distribution infarct, small volume acute ischemic right ACA/MCA watershed distribution infarct, and small soft tissue contusion at the right frontotemporal scalp. Mechanical thrombectomy was performed with MCA TICI 2b recanalization; occlusion of L distal M2/MCA and partial occlusion of distal A2 branches of L ACA noted with TICI 2c recanalization.  Stent x 2 to L carotid artery placed. ETT 8/29-8/30. PMH: GERD, HTN, IBS, polycistic ovarian syndrome, arthritis, morbid obesity, cancer, fibromyalgia, anxiety   Assessment / Plan / Recommendation Clinical Impression  Pt participated in speech-language evaluation which was limited due to pt's lethargy which progressed as the evaluation continued. Pt roused with verbal and tactile stimulation, but demonstrated significantly difficulty maintaining an adequately level of alertness.  Pt vocalized once during the evaluation, but no verbalization was noted despite prompts and cues. Pt inconsistently followed 1-step commands when verbal and tactile cues were given, but she did not accurately respond to even simple yes/no questions. SLP will follow for more thorough assessment of pt's speech and language skills, and for intervention.    SLP Assessment  SLP Recommendation/Assessment: Patient needs continued Speech [place] [facility] SLP Visit Diagnosis: Aphasia (R47.01)    Recommendations for follow up therapy are one component of a multi-disciplinary discharge planning process, led by the attending physician.  Recommendations may be updated based on patient status, additional functional criteria and insurance authorization.    Follow Up Recommendations  Acute inpatient rehab (3hours/day)    Assistance Recommended at Discharge  Frequent or constant Supervision/Assistance  Functional Status Assessment Patient has had a recent decline in their functional status and demonstrates the ability to make significant improvements in function in a reasonable and predictable amount of time.  Frequency and Duration min 2x/week  2 weeks      SLP Evaluation Cognition  Overall Cognitive Status: Difficult to assess Arousal/Alertness: Lethargic       Comprehension  Auditory Comprehension Overall Auditory Comprehension: Impaired Yes/No Questions: Impaired Basic Immediate Environment Questions:  (0/3) Complex Questions: Not tested Commands: Impaired One Step Basic Commands:  (0/4)    Expression Expression Primary Mode of Expression: Verbal Verbal Expression Overall Verbal Expression: Impaired Initiation: Impaired Automatic Speech:  (no initiation) Level of Generative/Spontaneous Verbalization:  (vocalization)   Oral / Motor  Oral Motor/Sensory Function Overall Oral Motor/Sensory Function:  (UTA) Motor Speech Overall Motor Speech:  (Inadequate verbal output for  assessment of motor speech)           Derrel Moore I. Hardin Negus, [place], [place] Office number [phone]  Horton Marshall 09/28/2021, 12:21 PM

## 2021-09-28 NOTE — Progress Notes (Signed)
STROKE TEAM PROGRESS NOTE   INTERVAL HISTORY Patient is seen in her room with her son at the bedside.  She has been hemodynamically stable and tolerating   tube feedings anticipate transfer out of the ICU.  Soon Vital signs stable.  Neurologically remains globally aphasic with dense right hemiplegia but is beginning to visually track. Vitals:   09/28/21 1400 09/28/21 1422 09/28/21 1500 09/28/21 1600  BP:  123/64 135/74 130/78  Pulse: 90 94 89 87  Resp:      Temp:      TempSrc:      SpO2: 99% 97% 95% 96%  Weight:      Height:       CBC:  Recent Labs  Lab 09/25/21 0505 09/25/21 0510 09/25/21 1154 09/25/21 1806  WBC 14.2*  --   --   --   NEUTROABS 6.8  --   --   --   HGB 14.1   < > 13.9 13.6  HCT 42.7   < > 41.0 40.0  MCV 90.9  --   --   --   PLT 388  --   --   --    < > = values in this interval not displayed.   Basic Metabolic Panel:  Recent Labs  Lab 09/25/21 0505 09/25/21 0510 09/25/21 1154 09/25/21 1806 09/27/21 0543 09/27/21 1711 09/28/21 0600  NA 140 139 142 143  --   --   --   K 3.2* 3.6 3.4* 3.5  --   --   --   CL 105 102  --   --   --   --   --   CO2 24  --   --   --   --   --   --   GLUCOSE 135* 135*  --   --   --   --   --   BUN 10 13  --   --   --   --   --   CREATININE 0.90 0.70  --   --   --   --   --   CALCIUM 9.4  --   --   --   --   --   --   MG  --   --   --   --    < > 2.3 2.5*  PHOS  --   --   --   --    < > 2.3* 3.2   < > = values in this interval not displayed.   Lipid Panel:  Recent Labs  Lab 09/27/21 0543  CHOL 233*  TRIG 243*  HDL 45  CHOLHDL 5.2  VLDL 49*  LDLCALC 139*   HgbA1c:  Recent Labs  Lab 09/25/21 0505  HGBA1C 5.8*   Urine Drug Screen:  Recent Labs  Lab 09/25/21 1130  LABOPIA NONE DETECTED  COCAINSCRNUR NONE DETECTED  LABBENZ NONE DETECTED  AMPHETMU NONE DETECTED  THCU POSITIVE*  LABBARB NONE DETECTED    Alcohol Level  Recent Labs  Lab 09/25/21 0505  ETH <10    IMAGING past 24 hours No results  found.  PHYSICAL EXAM  Physical Exam  Constitutional: Obese caucasian female in no acute distress Cardiovascular: Normal rate and regular rhythm.  Respiratory: Respirations regular and unlabored on supplemental O2  Neuro: Mental Status: Difficult to assess due to aphasia.  Will mimic occasionally. Receptive and expressive aphasia present Cranial Nerves: II: PERRL, blinks to threat bilaterally.  Slight left gaze preference but able  to look to the right cross midline Motor/Sensory: Tone is normal. Bulk is normal.  Full movement of left upper and lower extremities with 5/5 strength Withdraws to pain minimally on right upper with 2/5 strength but not lower, 0/5 right lower extremity.  No spontaneous movement or effort against gravity    ASSESSMENT/PLAN Ms. EMMI WERTHEIM is a 51 y.o. female with history of GERD, hypertension, IBS, polycystic ovarian syndrome, arthritis, morbid obesity presenting with right sided weakness, left gaze deviation, right hemianopsia and expressive aphasia after a fall.  CT showed dense left MCA and she was taken for emergent mechanical thrombectomy. Remained intubated post procedure. Extubated 8/30. PT/OT/ST pending  Stroke:  Left hemispheric infarct due to proximal left carotid occlusion with embolization to left M1 occlusion and left A2/3 occlusion s/p thrombectomy and placement of rescue proximal left internal carotid stent  Etiology: Proximal left ICA atherosclerosis with thrombosis and distal embolization Code Stroke CT head Dense left MCA consistent with thrombosis. ASPECTS 10.    CTA head & neck Emergent large vessel occlusion at the left ICA origin. Short segment of intracranial ICA reconstitution with subsequent left MCA thrombosis. Atherosclerosis. Suspect moderate narrowing at the right V1 segment. MRI Large evolving acute ischemic left MCA distribution infarct as above. Additional small volume acute ischemic nonhemorrhagic right ACA and/or  ACA/MCA watershed distribution infarct. Small area of restricted diffusion involving the right anterior temporal pole. While this finding may reflect an additional site of ischemia, a possible cortical contusion could also be considered given the presumed history of trauma. No associated hemorrhage. MRA  Interval revascularization of previously seen left ICA/MCA occlusion. Focal flow defect at the distal cervical left ICA Carotid Doppler  Left distal ICA stent consistent with 50-75% stenosis. 2D Echo EF 65-70% LDL 137 HgbA1c 5.8 VTE prophylaxis - Lovenox    Diet   Diet NPO time specified   No antithrombotic prior to admission, now on aspirin 81 mg daily and Brilinta (ticagrelor) 90 mg bid.  Therapy recommendations:  Pending Disposition:  pending  S/p Mechanical Thrombectomy with TICI2b of Lt ICA and M1 and TICI 2c revascularization of distal M2 and left A2 Mechanical thrombectomy of the left ICA and Left M1, Superior division of left distal M2 occluded and A2 with delayed occlusion of left carotid, now stented x2 MRI and MRA brain ordered for today  Acute Respiratory Failure CTA head and neck shows apical pulmonary opacity, favoring edema CCM to manage vent settings and nebs COPD exacerbation? Extubated 8/30  Hypertension Home meds:  hydrochlorothiazide Unstable- cleviprex infusing PRN hydralazine and labetalol  BP goal 120-140 for 24 hours post IR Long-term BP goal normotensive  Hyperlipidemia Home meds:  None LDL 137, goal < 70 Now on Zetia '10mg'$ , Crestor '20mg'$   Other Stroke Risk Factors Cigarette smoker, advised to stop smoking ETOH use, alcohol level <10, advised to drink no more than 1 drink(s) a day Obesity, Body mass index is 41.57 kg/m., BMI >/= 30 associated with increased stroke risk, recommend weight loss, diet and exercise as appropriate  Obstructive sleep apnea, on CPAP at home  Other Active Problems Chronic lower back pain with right sided sciatica,  fibromyalgia Home meds: Tramadol, zanaflex, cyclobenzaprine, celebrex IBS- on bentyl- hold for now GERD- protonix IV  Hospital day # 3  Continue mobilization out of bed.  Physical, occupational and speech therapy consults.  Continue Panda tube  tube feeding as she is unable to swallow.  Continue aspirin and Brilinta for carotid stent.  Long discussion with  son at the bedside and answered questions about her care.  He is open to PEG tube if necessary and would like to continue full support.  Transfer to neurology floor bed if available.This patient is critically ill and at significant risk of neurological worsening, death and care requires constant monitoring of vital signs, hemodynamics,respiratory and cardiac monitoring, extensive review of multiple databases, frequent neurological assessment, discussion with family, other specialists and medical decision making of high complexity.I have made any additions or clarifications directly to the above note.This critical care time does not reflect procedure time, or teaching time or supervisory time of PA/NP/Med Resident etc but could involve care discussion time.  I spent 30 minutes of neurocritical care time  in the care of  this patient.      Antony Contras, MD Medical Director [facility] Pager: [phone] 09/28/2021 4:18 PM    To contact Stroke Continuity provider, please refer to [email or web address] After hours, contact General Neurology

## 2021-09-29 DIAGNOSIS — E782 Mixed hyperlipidemia: Secondary | ICD-10-CM | POA: Diagnosis not present

## 2021-09-29 DIAGNOSIS — R1312 Dysphagia, oropharyngeal phase: Secondary | ICD-10-CM

## 2021-09-29 DIAGNOSIS — I63512 Cerebral infarction due to unspecified occlusion or stenosis of left middle cerebral artery: Secondary | ICD-10-CM | POA: Diagnosis not present

## 2021-09-29 LAB — BASIC METABOLIC PANEL
Anion gap: 6 (ref 5–15)
BUN: 16 mg/dL (ref 6–20)
CO2: 22 mmol/L (ref 22–32)
Calcium: 8.9 mg/dL (ref 8.9–10.3)
Chloride: 114 mmol/L — ABNORMAL HIGH (ref 98–111)
Creatinine, Ser: 0.64 mg/dL (ref 0.44–1.00)
GFR, Estimated: >60 mL/min (ref >60)
Glucose, Bld: 113 mg/dL — ABNORMAL HIGH (ref 70–99)
Potassium: 3.5 mmol/L (ref 3.5–5.1)
Sodium: 142 mmol/L (ref 135–145)

## 2021-09-29 LAB — CBC
HCT: 38.7 % (ref 36.0–46.0)
Hemoglobin: 12.8 g/dL (ref 12.0–15.0)
MCH: 30 pg (ref 26.0–34.0)
MCHC: 33.1 g/dL (ref 30.0–36.0)
MCV: 90.6 fL (ref 80.0–100.0)
Platelets: 316 10*3/uL (ref 150–400)
RBC: 4.27 MIL/uL (ref 3.87–5.11)
RDW: 14.2 % (ref 11.5–15.5)
WBC: 15.1 10*3/uL — ABNORMAL HIGH (ref 4.0–10.5)
nRBC: 0 % (ref 0.0–0.2)

## 2021-09-29 LAB — GLUCOSE, CAPILLARY
Glucose-Capillary: 103 mg/dL — ABNORMAL HIGH (ref 70–99)
Glucose-Capillary: 132 mg/dL — ABNORMAL HIGH (ref 70–99)
Glucose-Capillary: 133 mg/dL — ABNORMAL HIGH (ref 70–99)
Glucose-Capillary: 145 mg/dL — ABNORMAL HIGH (ref 70–99)
Glucose-Capillary: 147 mg/dL — ABNORMAL HIGH (ref 70–99)
Glucose-Capillary: 152 mg/dL — ABNORMAL HIGH (ref 70–99)
Glucose-Capillary: 163 mg/dL — ABNORMAL HIGH (ref 70–99)

## 2021-09-29 NOTE — Progress Notes (Signed)
Patient is not appropriate for CPAP due to mental status and NG tube.

## 2021-09-29 NOTE — Progress Notes (Signed)
Inpatient Rehab Admissions Coordinator:    CIR consult received. Pt. Is currently total A with bed mobility and not a level where I believe she could tolerate the intensity of CIR. I will follow for progress and participation with therapies, but at this time, recommend TOC look for lower intensity rehab venues.   Clemens Catholic, [place], [place] Admissions Coordinator  [phone] ([place]) [phone] (office)

## 2021-09-29 NOTE — Progress Notes (Addendum)
STROKE TEAM PROGRESS NOTE   INTERVAL HISTORY Patient is seen in her room with no family at the bedside.  She has been hemodynamically stable and her neurological exam remains stable. Plan to send her to CIR soon and place 30 day cardiac monitor.  Vitals:   09/29/21 0603 09/29/21 0815 09/29/21 0854 09/29/21 1133  BP:  (!) 149/77  131/71  Pulse:  87  85  Resp: '19 14  20  '$ Temp:  98 F (36.7 C)  98.7 F (37.1 C)  TempSrc:  Oral  Oral  SpO2:  96% 97% 96%  Weight:      Height:       CBC:  Recent Labs  Lab 09/25/21 0505 09/25/21 0510 09/25/21 1806 09/29/21 0442  WBC 14.2*  --   --  15.1*  NEUTROABS 6.8  --   --   --   HGB 14.1   < > 13.6 12.8  HCT 42.7   < > 40.0 38.7  MCV 90.9  --   --  90.6  PLT 388  --   --  316   < > = values in this interval not displayed.    Basic Metabolic Panel:  Recent Labs  Lab 09/25/21 0505 09/25/21 0510 09/25/21 1154 09/25/21 1806 09/27/21 0543 09/28/21 0600 09/28/21 1643 09/29/21 0442  NA 140 139   < > 143  --   --   --  142  K 3.2* 3.6   < > 3.5  --   --   --  3.5  CL 105 102  --   --   --   --   --  114*  CO2 24  --   --   --   --   --   --  22  GLUCOSE 135* 135*  --   --   --   --   --  113*  BUN 10 13  --   --   --   --   --  16  CREATININE 0.90 0.70  --   --   --   --   --  0.64  CALCIUM 9.4  --   --   --   --   --   --  8.9  MG  --   --   --   --    < > 2.5* 2.5*  --   PHOS  --   --   --   --    < > 3.2 3.2  --    < > = values in this interval not displayed.    Lipid Panel:  Recent Labs  Lab 09/27/21 0543  CHOL 233*  TRIG 243*  HDL 45  CHOLHDL 5.2  VLDL 49*  LDLCALC 139*    HgbA1c:  Recent Labs  Lab 09/25/21 0505  HGBA1C 5.8*    Urine Drug Screen:  Recent Labs  Lab 09/25/21 1130  LABOPIA NONE DETECTED  COCAINSCRNUR NONE DETECTED  LABBENZ NONE DETECTED  AMPHETMU NONE DETECTED  THCU POSITIVE*  LABBARB NONE DETECTED     Alcohol Level  Recent Labs  Lab 09/25/21 0505  ETH <10     IMAGING past 24  hours No results found.  PHYSICAL EXAM  Physical Exam  Constitutional: Obese caucasian female in no acute distress Respiratory: Respirations regular and unlabored on supplemental O2  Neuro: Mental Status: Difficult to assess due to aphasia.  Will mimic occasionally. Receptive and expressive aphasia present Cranial Nerves: II: PERRL, blinks  to threat bilaterally.  Slight left gaze preference but able to look to the right cross midline Motor/Sensory: Tone is normal. Bulk is normal.  Full movement of left upper and lower extremities with 5/5 strength Withdraws to pain minimally on right upper with 2/5 strength but not lower, 0/5 right lower extremity.  No spontaneous movement or effort against gravity    ASSESSMENT/PLAN Ms. DUSTEE BOTTENFIELD is a 51 y.o. female with history of GERD, hypertension, IBS, polycystic ovarian syndrome, arthritis, morbid obesity presenting with right sided weakness, left gaze deviation, right hemianopsia and expressive aphasia after a fall.  CT showed dense left MCA and she was taken for emergent mechanical thrombectomy. Remained intubated post procedure. Extubated 8/30. PT/OT/ST pending  Stroke:  Left MCA and b/l ACA infarcts due to L ICA, M1 and A2/3 occlusion s/p IR with rescue proximal L ICA stent, etiology likely large vessel disease form left ICA atherosclerosis, but ddx including cardioembolic source Code Stroke CT head hyperdense left MCA consistent with thrombosis. ASPECTS 10.    CTA head & neck Emergent large vessel occlusion at the left ICA origin. Short segment of intracranial ICA reconstitution with subsequent left MCA thrombosis. Atherosclerosis. Suspect moderate narrowing at the right V1 segment. S/p IR with TICI2b on left M2 and TICI2c on left A2 MRI Large evolving acute ischemic left MCA distribution infarct. Additional small bilateral ACA and/or ACA/MCA watershed distribution infarct.  MRA  Interval revascularization of previously seen left  ICA/MCA occlusion. Focal flow defect at the distal cervical left ICA Carotid Doppler  Left distal ICA stent consistent with 50-75% stenosis. 2D Echo EF 65-70% Recommend 30 day monitoring as outpt to rule out afib LDL 137 HgbA1c 5.8 UDS positive for THC VTE prophylaxis - Lovenox No antithrombotic prior to admission, now on aspirin 81 mg daily and Brilinta (ticagrelor) 90 mg bid post ICA stenting Therapy recommendations:  CIR vs. SNF Disposition:  pending  Acute Respiratory Failure CTA head and neck shows apical pulmonary opacity, favoring edema CCM to manage vent settings and nebs COPD exacerbation? Extubated 8/30, out of ICU now  Hypertension Home meds:  hydrochlorothiazide Stable  On lisinopril 10 Long-term BP goal normotensive  Hyperlipidemia Home meds: Zocor 10 and zetia LDL 137, goal < 70 Now on Zetia '10mg'$ , Crestor '20mg'$  Continue statin on discharge  Dysphagia Did not pass swallow on TF @ 48 May need PEG if going to SNF  Tobacco abuse Current smoker Smoking cessation counseling will be provided  Other Stroke Risk Factors THC use, cessation education will be provided Obesity, Body mass index is 41.57 kg/m., BMI >/= 30 associated with increased stroke risk, recommend weight loss, diet and exercise as appropriate   Other Active Problems Chronic lower back pain with right sided sciatica, fibromyalgia Home meds: Tramadol, zanaflex, cyclobenzaprine, celebrex IBS- on bentyl- hold for now GERD- protonix  Leukocytosis WBC 14.2->15.1  Hospital day # Arp , MSN, AGACNP-BC [facility] See Amion for schedule and pager information 09/29/2021 11:55 AM   ATTENDING NOTE: I reviewed above note and agree with the assessment and plan. Pt was seen and examined.   52 year old female with history of hypertension, obesity admitted for right-sided weakness and aphasia.  CT showed left MCA hyperdense sign.  CT head and neck left ICA, left M1 and  left A2/3 occlusion.  Status post IR with TICI2b at L M2 and TICI2c at left A2 as well as left ICA stenting.  Patient was intubated a second day, MRI  showed left MCA large infarct in the bilateral ACA punctate infarcts.  Carotid Doppler left stent 50 to 75% stenosis.  EF 55 to 70%.  LDL 139, A1c 5.8, UDS positive for THC.  WBC 14.2-15.1.  Creatinine 0.70.  On exam, no family is at the bedside, pt lying in bed, initially sleeping but arousable, with eyes open she has left gaze preference but able to track bilaterally, more consistently blinking to visual threat on the left but not blinking to the right. Global aphasia, nonverbal but seems to follow limited simple commands, not able to name and repeat. Right facial droop. Tongue protrusion not cooperative. Moving LUE and LLE spontaneously and against gravity. Right hemiplegia, decreased muscle tone. Sensation, coordination and gait not tested.   Etiology for patient stroke not quite clear, could be due to large vessel disease from left ICA given uncontrolled risk factors.  However cardioembolic source cannot be occluded.  Recommend 30-day CardioNet monitoring as outpatient.  Now on aspirin 81 and Brilinta post stenting.  Put on Crestor 20, continue Zetia.  Patient dysphagia, did not pass a swallow, currently n.p.o. on tube feeding.  PT/OT recommend CIR versus SNF.  If patient goes to SNF, then PEG tube will be needed.  For detailed assessment and plan, please refer to above/below as I have made changes wherever appropriate.   Rosalin Hawking, MD PhD Stroke Neurology 09/29/2021 2:01 PM    To contact Stroke Continuity provider, please refer to [email or web address] After hours, contact General Neurology

## 2021-09-30 ENCOUNTER — Inpatient Hospital Stay (HOSPITAL_COMMUNITY): Payer: Medicare HMO

## 2021-09-30 DIAGNOSIS — I63512 Cerebral infarction due to unspecified occlusion or stenosis of left middle cerebral artery: Secondary | ICD-10-CM | POA: Diagnosis not present

## 2021-09-30 LAB — BASIC METABOLIC PANEL
Anion gap: 7 (ref 5–15)
BUN: 17 mg/dL (ref 6–20)
CO2: 22 mmol/L (ref 22–32)
Calcium: 9.1 mg/dL (ref 8.9–10.3)
Chloride: 114 mmol/L — ABNORMAL HIGH (ref 98–111)
Creatinine, Ser: 0.69 mg/dL (ref 0.44–1.00)
GFR, Estimated: >60 mL/min (ref >60)
Glucose, Bld: 121 mg/dL — ABNORMAL HIGH (ref 70–99)
Potassium: 3.5 mmol/L (ref 3.5–5.1)
Sodium: 143 mmol/L (ref 135–145)

## 2021-09-30 LAB — URINALYSIS, ROUTINE W REFLEX MICROSCOPIC
Bilirubin Urine: NEGATIVE
Glucose, UA: NEGATIVE mg/dL
Hgb urine dipstick: NEGATIVE
Ketones, ur: NEGATIVE mg/dL
Leukocytes,Ua: NEGATIVE
Nitrite: POSITIVE — AB
Protein, ur: NEGATIVE mg/dL
Specific Gravity, Urine: 1.014 (ref 1.005–1.030)
pH: 7 (ref 5.0–8.0)

## 2021-09-30 LAB — CBC
HCT: 37.6 % (ref 36.0–46.0)
Hemoglobin: 12.7 g/dL (ref 12.0–15.0)
MCH: 30.4 pg (ref 26.0–34.0)
MCHC: 33.8 g/dL (ref 30.0–36.0)
MCV: 90 fL (ref 80.0–100.0)
Platelets: 319 10*3/uL (ref 150–400)
RBC: 4.18 MIL/uL (ref 3.87–5.11)
RDW: 14.4 % (ref 11.5–15.5)
WBC: 15.9 10*3/uL — ABNORMAL HIGH (ref 4.0–10.5)
nRBC: 0 % (ref 0.0–0.2)

## 2021-09-30 LAB — GLUCOSE, CAPILLARY
Glucose-Capillary: 148 mg/dL — ABNORMAL HIGH (ref 70–99)
Glucose-Capillary: 117 mg/dL — ABNORMAL HIGH (ref 70–99)
Glucose-Capillary: 121 mg/dL — ABNORMAL HIGH (ref 70–99)
Glucose-Capillary: 109 mg/dL — ABNORMAL HIGH (ref 70–99)
Glucose-Capillary: 106 mg/dL — ABNORMAL HIGH (ref 70–99)

## 2021-09-30 NOTE — Progress Notes (Addendum)
STROKE TEAM PROGRESS NOTE   INTERVAL HISTORY Patient is seen in her room with no family at the bedside.  She has been hemodynamically stable and her neurological exam remains stable although WBC remains elevated.  CXR shows mild pulmonary edema.  Will obtain UA to rule out UTI.  Vitals:   09/30/21 0526 09/30/21 0601 09/30/21 0854 09/30/21 1001  BP: (!) 149/74  133/67   Pulse: 81  78   Resp:  15 17   Temp: 99.1 F (37.3 C)  97.9 F (36.6 C)   TempSrc: Oral  Oral   SpO2: 97%  100% 99%  Weight:      Height:       CBC:  Recent Labs  Lab 09/25/21 0505 09/25/21 0510 09/29/21 0442 09/30/21 0406  WBC 14.2*  --  15.1* 15.9*  NEUTROABS 6.8  --   --   --   HGB 14.1   < > 12.8 12.7  HCT 42.7   < > 38.7 37.6  MCV 90.9  --  90.6 90.0  PLT 388  --  316 319   < > = values in this interval not displayed.    Basic Metabolic Panel:  Recent Labs  Lab 09/28/21 0600 09/28/21 1643 09/29/21 0442 09/30/21 0406  NA  --   --  142 143  K  --   --  3.5 3.5  CL  --   --  114* 114*  CO2  --   --  22 22  GLUCOSE  --   --  113* 121*  BUN  --   --  16 17  CREATININE  --   --  0.64 0.69  CALCIUM  --   --  8.9 9.1  MG 2.5* 2.5*  --   --   PHOS 3.2 3.2  --   --     Lipid Panel:  Recent Labs  Lab 09/27/21 0543  CHOL 233*  TRIG 243*  HDL 45  CHOLHDL 5.2  VLDL 49*  LDLCALC 139*    HgbA1c:  Recent Labs  Lab 09/25/21 0505  HGBA1C 5.8*    Urine Drug Screen:  Recent Labs  Lab 09/25/21 1130  LABOPIA NONE DETECTED  COCAINSCRNUR NONE DETECTED  LABBENZ NONE DETECTED  AMPHETMU NONE DETECTED  THCU POSITIVE*  LABBARB NONE DETECTED     Alcohol Level  Recent Labs  Lab 09/25/21 0505  ETH <10     IMAGING past 24 hours DG CHEST PORT 1 VIEW  Result Date: 09/30/2021 CLINICAL DATA:  Leukocytosis EXAM: PORTABLE CHEST 1 VIEW COMPARISON:  September 25, 2021 FINDINGS: The feeding tube terminates below today's film. No pneumothorax. Diffuse bilateral interstitial opacities. The  cardiomediastinal silhouette is stable. IMPRESSION: Diffuse interstitial opacities suggest pulmonary edema. An atypical infection could have the same appearance. Recommend clinical correlation. No focal infiltrate. Electronically Signed   By: Dorise Bullion III M.D.   On: 09/30/2021 10:29    PHYSICAL EXAM  Physical Exam  Constitutional: Obese caucasian female in no acute distress Respiratory: Respirations regular and unlabored on room air  Neuro: Mental Status: Difficult to assess due to aphasia.  Will mimic occasionally. Receptive and expressive aphasia present Cranial Nerves: II: PERRL, blinks to threat bilaterally.  Slight left gaze preference but able to look to the right cross midline Motor/Sensory: Tone is normal. Bulk is normal.  Full movement of left upper and lower extremities with 5/5 strength Withdraws to pain minimally on right upper with 2/5 strength but not lower, 0/5 right  lower extremity.  No spontaneous movement or effort against gravity    ASSESSMENT/PLAN Erica Mcdaniel is a 51 y.o. female with history of GERD, hypertension, IBS, polycystic ovarian syndrome, arthritis, morbid obesity presenting with right sided weakness, left gaze deviation, right hemianopsia and expressive aphasia after a fall.  CT showed dense left MCA and she was taken for emergent mechanical thrombectomy. Remained intubated post procedure. Extubated 8/30. PT/OT/ST pending  Stroke:  Left MCA and b/l ACA infarcts due to L ICA, M1 and A2/3 occlusion s/p IR with rescue proximal L ICA stent, etiology likely large vessel disease form left ICA atherosclerosis, but ddx including cardioembolic source Code Stroke CT head hyperdense left MCA consistent with thrombosis. ASPECTS 10.    CTA head & neck Emergent large vessel occlusion at the left ICA origin. Short segment of intracranial ICA reconstitution with subsequent left MCA thrombosis. Atherosclerosis. Suspect moderate narrowing at the right V1  segment. S/p IR with TICI2b on left M2 and TICI2c on left A2 MRI Large evolving acute ischemic left MCA distribution infarct. Additional small bilateral ACA and/or ACA/MCA watershed distribution infarct.  MRA  Interval revascularization of previously seen left ICA/MCA occlusion. Focal flow defect at the distal cervical left ICA Carotid Doppler  Left distal ICA stent consistent with 50-75% stenosis. 2D Echo EF 65-70% Recommend 30 day monitoring as outpt to rule out afib LDL 137 HgbA1c 5.8 UDS positive for THC VTE prophylaxis - Lovenox No antithrombotic prior to admission, now on aspirin 81 mg daily and Brilinta (ticagrelor) 90 mg bid post ICA stenting Therapy recommendations:  CIR vs. SNF Disposition:  pending  Acute Respiratory Failure CTA head and neck shows apical pulmonary opacity, favoring edema CCM to manage vent settings and nebs COPD exacerbation? Extubated 8/30, out of ICU now  Hypertension Home meds:  hydrochlorothiazide Stable  On lisinopril 10 Long-term BP goal normotensive  Hyperlipidemia Home meds: Zocor 10 and zetia LDL 137, goal < 70 Now on Zetia '10mg'$ , Crestor '20mg'$  Continue statin on discharge  Dysphagia Did not pass swallow on TF @ 50 May need PEG if going to SNF  Leukocytosis  WBC 14.2->15.1->15.9 UA pending CXR Diffuse interstitial opacities Afebrile  Continue monitoring  Tobacco abuse Current smoker Smoking cessation counseling will be provided  Other Stroke Risk Factors THC use, cessation education will be provided Obesity, Body mass index is 39.52 kg/m., BMI >/= 30 associated with increased stroke risk, recommend weight loss, diet and exercise as appropriate   Other Active Problems Chronic lower back pain with right sided sciatica, fibromyalgia Home meds: Tramadol, zanaflex, cyclobenzaprine, celebrex IBS- on bentyl- hold for now GERD- protonix   Hospital day # [place] , MSN, AGACNP-BC [facility] See  Amion for schedule and pager information 09/30/2021 10:57 AM   ATTENDING NOTE: I reviewed above note and agree with the assessment and plan. Pt was seen and examined.   No family at bedside.  Patient still has lethargy and mild sleepiness, however open eyes easily on voice, tracking on the left side but not on the right, however able to have right gaze.  Still has right hemiplegia.  Continue on tube feeding.  Leukocytosis worsened today, CXR showed diffuse interstitial opacities.  UA pending, afebrile.  Continue close monitoring.  For detailed assessment and plan, please refer to above/below as I have made changes wherever appropriate.   Rosalin Hawking, MD PhD Stroke Neurology 09/30/2021 1:56 PM    To contact Stroke Continuity provider, please refer to  [email or web address] After hours, contact General Neurology

## 2021-09-30 NOTE — Progress Notes (Signed)
Pt unable to wear CPAP at this time due to mental status, requiring mitts, and tube in nose.

## 2021-10-01 DIAGNOSIS — D72829 Elevated white blood cell count, unspecified: Secondary | ICD-10-CM | POA: Diagnosis not present

## 2021-10-01 DIAGNOSIS — R1312 Dysphagia, oropharyngeal phase: Secondary | ICD-10-CM | POA: Diagnosis not present

## 2021-10-01 DIAGNOSIS — F17200 Nicotine dependence, unspecified, uncomplicated: Secondary | ICD-10-CM | POA: Diagnosis not present

## 2021-10-01 DIAGNOSIS — I63512 Cerebral infarction due to unspecified occlusion or stenosis of left middle cerebral artery: Secondary | ICD-10-CM | POA: Diagnosis not present

## 2021-10-01 LAB — BASIC METABOLIC PANEL
Anion gap: 9 (ref 5–15)
BUN: 20 mg/dL (ref 6–20)
CO2: 23 mmol/L (ref 22–32)
Calcium: 9.3 mg/dL (ref 8.9–10.3)
Chloride: 114 mmol/L — ABNORMAL HIGH (ref 98–111)
Creatinine, Ser: 0.66 mg/dL (ref 0.44–1.00)
GFR, Estimated: >60 mL/min (ref >60)
Glucose, Bld: 164 mg/dL — ABNORMAL HIGH (ref 70–99)
Potassium: 3.5 mmol/L (ref 3.5–5.1)
Sodium: 146 mmol/L — ABNORMAL HIGH (ref 135–145)

## 2021-10-01 LAB — GLUCOSE, CAPILLARY
Glucose-Capillary: 112 mg/dL — ABNORMAL HIGH (ref 70–99)
Glucose-Capillary: 129 mg/dL — ABNORMAL HIGH (ref 70–99)
Glucose-Capillary: 131 mg/dL — ABNORMAL HIGH (ref 70–99)
Glucose-Capillary: 141 mg/dL — ABNORMAL HIGH (ref 70–99)
Glucose-Capillary: 143 mg/dL — ABNORMAL HIGH (ref 70–99)
Glucose-Capillary: 146 mg/dL — ABNORMAL HIGH (ref 70–99)
Glucose-Capillary: 173 mg/dL — ABNORMAL HIGH (ref 70–99)

## 2021-10-01 LAB — CBC
HCT: 38.4 % (ref 36.0–46.0)
Hemoglobin: 13 g/dL (ref 12.0–15.0)
MCH: 30.7 pg (ref 26.0–34.0)
MCHC: 33.9 g/dL (ref 30.0–36.0)
MCV: 90.6 fL (ref 80.0–100.0)
Platelets: 338 10*3/uL (ref 150–400)
RBC: 4.24 MIL/uL (ref 3.87–5.11)
RDW: 14.4 % (ref 11.5–15.5)
WBC: 15.1 10*3/uL — ABNORMAL HIGH (ref 4.0–10.5)
nRBC: 0 % (ref 0.0–0.2)

## 2021-10-01 NOTE — Progress Notes (Signed)
Pt unable to wear CPAP at this time due to mental status, requiring mitts.

## 2021-10-01 NOTE — Progress Notes (Addendum)
STROKE TEAM PROGRESS NOTE   INTERVAL HISTORY Patient is asleep in bed in NAD. No family at the bedside. She awakens easily to loud voice. She has aphasia and does not follow commands or speak. Right side is flaccid. Grimaces to pain in all 4 extremities. VS stable   Vitals:   10/01/21 0425 10/01/21 0500 10/01/21 0728 10/01/21 0819  BP: (!) 141/65  (!) 153/70   Pulse: 81  81 80  Resp: '18  20 16  '$ Temp: 98.2 F (36.8 C)  98.1 F (36.7 C)   TempSrc: Oral  Oral   SpO2: 96%  97% 97%  Weight:  112.9 kg    Height:       CBC:  Recent Labs  Lab 09/25/21 0505 09/25/21 0510 09/30/21 0406 10/01/21 0345  WBC 14.2*   < > 15.9* 15.1*  NEUTROABS 6.8  --   --   --   HGB 14.1   < > 12.7 13.0  HCT 42.7   < > 37.6 38.4  MCV 90.9   < > 90.0 90.6  PLT 388   < > 319 338   < > = values in this interval not displayed.    Basic Metabolic Panel:  Recent Labs  Lab 09/28/21 0600 09/28/21 1643 09/29/21 0442 09/30/21 0406 10/01/21 0345  NA  --   --    < > 143 146*  K  --   --    < > 3.5 3.5  CL  --   --    < > 114* 114*  CO2  --   --    < > 22 23  GLUCOSE  --   --    < > 121* 164*  BUN  --   --    < > 17 20  CREATININE  --   --    < > 0.69 0.66  CALCIUM  --   --    < > 9.1 9.3  MG 2.5* 2.5*  --   --   --   PHOS 3.2 3.2  --   --   --    < > = values in this interval not displayed.    Lipid Panel:  Recent Labs  Lab 09/27/21 0543  CHOL 233*  TRIG 243*  HDL 45  CHOLHDL 5.2  VLDL 49*  LDLCALC 139*    HgbA1c:  Recent Labs  Lab 09/25/21 0505  HGBA1C 5.8*    Urine Drug Screen:  Recent Labs  Lab 09/25/21 1130  LABOPIA NONE DETECTED  COCAINSCRNUR NONE DETECTED  LABBENZ NONE DETECTED  AMPHETMU NONE DETECTED  THCU POSITIVE*  LABBARB NONE DETECTED     Alcohol Level  Recent Labs  Lab 09/25/21 0505  ETH <10     IMAGING past 24 hours DG CHEST PORT 1 VIEW  Result Date: 09/30/2021 CLINICAL DATA:  Leukocytosis EXAM: PORTABLE CHEST 1 VIEW COMPARISON:  September 25, 2021  FINDINGS: The feeding tube terminates below today's film. No pneumothorax. Diffuse bilateral interstitial opacities. The cardiomediastinal silhouette is stable. IMPRESSION: Diffuse interstitial opacities suggest pulmonary edema. An atypical infection could have the same appearance. Recommend clinical correlation. No focal infiltrate. Electronically Signed   By: Dorise Bullion III M.D.   On: 09/30/2021 10:29    PHYSICAL EXAM  Physical Exam  Constitutional: Obese caucasian female in no acute distress Respiratory: Respirations regular and unlabored on room air  Neuro: Mental Status: Difficult to assess due to aphasia.  Does not follow commands Receptive and expressive aphasia  present Cranial Nerves: II: PERRL, blinks to threat bilaterally.  Slight left gaze preference but able to look to the right cross midline Motor/Sensory: Tone is normal. Bulk is normal.  Full movement of left upper and lower extremities with 5/5 strength Withdraws to pain minimally on right upper flaccid 0/5 right lower flaccid extremity.  No spontaneous movement or effort against gravity    ASSESSMENT/PLAN Erica Mcdaniel is a 51 y.o. female with history of GERD, hypertension, IBS, polycystic ovarian syndrome, arthritis, morbid obesity presenting with right sided weakness, left gaze deviation, right hemianopsia and expressive aphasia after a fall.  CT showed dense left MCA and she was taken for emergent mechanical thrombectomy. Remained intubated post procedure. Extubated 8/30. PT/OT/ST pending  Stroke:  Left MCA and b/l ACA infarcts due to L ICA, M1 and A2/3 occlusion s/p IR with rescue proximal L ICA stent, etiology likely large vessel disease form left ICA atherosclerosis, but ddx including cardioembolic source Code Stroke CT head hyperdense left MCA consistent with thrombosis. ASPECTS 10.    CTA head & neck Emergent large vessel occlusion at the left ICA origin. Short segment of intracranial ICA  reconstitution with subsequent left MCA thrombosis. Atherosclerosis. Suspect moderate narrowing at the right V1 segment. S/p IR with TICI2b on left M2 and TICI2c on left A2 MRI Large evolving acute ischemic left MCA distribution infarct. Additional small bilateral ACA and/or ACA/MCA watershed distribution infarct.  MRA  Interval revascularization of previously seen left ICA/MCA occlusion. Focal flow defect at the distal cervical left ICA Carotid Doppler  Left distal ICA stent consistent with 50-75% stenosis. 2D Echo EF 65-70% Recommend 30 day monitoring as outpt to rule out afib LDL 137 HgbA1c 5.8 UDS positive for THC Hypercoagulable work up pending VTE prophylaxis - Lovenox No antithrombotic prior to admission, now on aspirin 81 mg daily and Brilinta (ticagrelor) 90 mg bid post ICA stenting Therapy recommendations:  CIR vs. SNF Disposition:  pending  Hypertension Home meds:  hydrochlorothiazide Stable  On lisinopril 10 Long-term BP goal normotensive  Hyperlipidemia Home meds: Zocor 10 and zetia LDL 137, goal < 70 Now on Zetia '10mg'$ , Crestor '20mg'$  Continue statin on discharge  Dysphagia Did not pass swallow on TF @ 48 May need PEG if going to SNF  Leukocytosis  WBC 14.2->15.1->15.9->15.1 UA negative CXR Diffuse interstitial opacities Afebrile  Continue monitoring  Tobacco abuse Current smoker Smoking cessation counseling will be provided  Other Stroke Risk Factors THC use, cessation education will be provided Obesity, Body mass index is 37.85 kg/m., BMI >/= 30 associated with increased stroke risk, recommend weight loss, diet and exercise as appropriate   Other Active Problems Chronic lower back pain with right sided sciatica, fibromyalgia Home meds: Tramadol, zanaflex, cyclobenzaprine, celebrex IBS- on bentyl- hold for now GERD- protonix   Hospital day # 6  August Albino  DNP, ACNPC-AG [facility] See Amion for schedule and pager  information 10/01/2021 9:40 AM    ATTENDING NOTE: I reviewed above note and agree with the assessment and plan. Pt was seen and examined.   No family at the bedside.  Patient lying in bed, eyes open, awake, alert, tracking bilaterally.  More interactive and less sleepiness than yesterday.  However, still has global aphasia and right hemiplegia.  Speech on board, continues to be n.p.o.  On tube feeding.  On DAPT and statin, hypercoagulable work-up pending, recommend 30-day CardioNet monitoring as outpatient to rule out A-fib.  PT/OT recommend CIR, however CIR coordinator felt patient not  able to tolerating 3 hours aggressive therapy.  May need SNF at the end, if still, she needs PEG tube for placement.  Leukocytosis slightly improved, no source found, continue monitoring.  For detailed assessment and plan, please refer to above/below as I have made changes wherever appropriate.   Rosalin Hawking, MD PhD Stroke Neurology 10/01/2021 1:11 PM    To contact Stroke Continuity provider, please refer to [email or web address] After hours, contact General Neurology

## 2021-10-01 NOTE — Progress Notes (Signed)
Physical Therapy Treatment Patient Details Name: Erica Mcdaniel MRN: [id number] DOB: 10-Jun-1970 Today's Date: 10/01/2021   History of Present Illness Pt is a 51 y.o. female who presented 09/25/21 with R-sided weakness, aphasia, L gaze deviation, and R hemianopsia. Pt outside window for TNKase. MRI revealed large acute ischemic left MCA distribution infarct, small volume acute ischemic right ACA/MCA watershed distribution infarct, and small soft tissue contusion at the right frontotemporal scalp. S/p cerebral angiogram 8/29. ETT 8/29-8/30. PMH: GERD, HTN, IBS, polycistic ovarian syndrome, arthritis, morbid obesity, cancer, fibromyalgia, anxiety    PT Comments    Patient progressing towards physical therapy. Although patient continues to require totalA+2, patient demonstrates improved initiation of trunk assist but inadequate strength hinders ability to provide enough assist. Once in sitting, patient with no pushing noted and able to statically sit with min guard without LOB. With use of mirror, patient able to visualize herself and maintain midline. Addition of dynamic task, patient does require min-modA to maintain due to R lateral LOB but does demonstrate reactive balance with reaching for therapist or bed with L UE. Continue to recommend acute inpatient rehab (AIR) for post-acute therapy needs.     Recommendations for follow up therapy are one component of a multi-disciplinary discharge planning process, led by the attending physician.  Recommendations may be updated based on patient status, additional functional criteria and insurance authorization.  Follow Up Recommendations  Acute inpatient rehab (3hours/day)     Assistance Recommended at Discharge Frequent or constant Supervision/Assistance  Patient can return home with the following Two people to help with walking and/or transfers;Two people to help with bathing/dressing/bathroom;Assistance with cooking/housework;Assistance with  feeding;Direct supervision/assist for medications management;Direct supervision/assist for financial management;Assist for transportation;Help with stairs or ramp for entrance   Equipment Recommendations  Rolling Denaja Verhoeven (2 wheels);BSC/3in1;Wheelchair (measurements PT);Wheelchair cushion (measurements PT);Hospital bed;Other (comment) (hoyer lift; pending progress)    Recommendations for Other Services Rehab consult     Precautions / Restrictions Precautions Precautions: Fall;Other (comment) Precaution Comments: watch SpO2; L mitten; cortrak Restrictions Weight Bearing Restrictions: No     Mobility  Bed Mobility Overal bed mobility: Needs Assistance Bed Mobility: Rolling, Sidelying to Sit, Sit to Sidelying     Supine to sit: Total assist, +2 for physical assistance, +2 for safety/equipment Sit to supine: Total assist, +2 for physical assistance, +2 for safety/equipment   General bed mobility comments: better initiation by patient for trunk elevation but inadequate strength at this time to assist. TotalA+2 for all aspects of bed mobility. Mutlimodal cueing for reaching with L UE and moving L LE towards EOB    Transfers                        Ambulation/Gait                   Stairs             Wheelchair Mobility    Modified Rankin (Stroke Patients Only) Modified Rankin (Stroke Patients Only) Pre-Morbid Rankin Score: No symptoms Modified Rankin: Severe disability     Balance Overall balance assessment: Needs assistance Sitting-balance support: Feet supported, Single extremity supported Sitting balance-Leahy Scale: Poor Sitting balance - Comments: dynamically poor but static fair this session. Patient with no pushing this session. Able to sit EOB with min guard after repositioning hips and placing R UE on bed. Able to utilize L UE to wash face with max cueing. With addition of L LE LAQ, patient with R lateral  LOB requiring min-modA to recover.  Patient does demonstrate reactive balance as she reaches for therapist hand or bed on L side when falling towards R                                    Cognition Arousal/Alertness: Awake/alert Behavior During Therapy: Flat affect Overall Cognitive Status: Difficult to assess Area of Impairment: Attention, Following commands, Safety/judgement, Awareness, Problem solving                   Current Attention Level: Focused   Following Commands: Follows one step commands inconsistently, Follows one step commands with increased time Safety/Judgement: Decreased awareness of safety, Decreased awareness of deficits Awareness: Intellectual Problem Solving: Slow processing, Decreased initiation, Difficulty sequencing, Requires verbal cues, Requires tactile cues General Comments: keeping eyes open throughout session. Requires max multimodal cueing and has 5-10 second delay for processing. Responds better with visual or tactile cueing.        Exercises      General Comments        Pertinent Vitals/Pain Pain Assessment Pain Assessment: Faces Faces Pain Scale: No hurt Pain Intervention(s): Monitored during session    Home Living                          Prior Function            PT Goals (current goals can now be found in the care plan section) Acute Rehab PT Goals PT Goal Formulation: With patient Time For Goal Achievement: 10/11/21 Potential to Achieve Goals: Fair Progress towards PT goals: Progressing toward goals    Frequency    Min 4X/week      PT Plan Current plan remains appropriate    Co-evaluation PT/OT/SLP Co-Evaluation/Treatment: Yes Reason for Co-Treatment: To address functional/ADL transfers;Necessary to address cognition/behavior during functional activity;For patient/therapist safety PT goals addressed during session: Mobility/safety with mobility;Balance        AM-PAC PT "6 Clicks" Mobility   Outcome Measure  Help  needed turning from your back to your side while in a flat bed without using bedrails?: A Little Help needed moving from lying on your back to sitting on the side of a flat bed without using bedrails?: Total Help needed moving to and from a bed to a chair (including a wheelchair)?: Total Help needed standing up from a chair using your arms (e.g., wheelchair or bedside chair)?: Total Help needed to walk in hospital room?: Total Help needed climbing 3-5 steps with a railing? : Total 6 Click Score: 8    End of Session   Activity Tolerance: Patient tolerated treatment well Patient left: in bed;with call bell/phone within reach;with bed alarm set;with restraints reapplied Nurse Communication: Mobility status PT Visit Diagnosis: Difficulty in walking, not elsewhere classified (R26.2);Unsteadiness on feet (R26.81);Muscle weakness (generalized) (M62.81);Other symptoms and signs involving the nervous system (R29.898);Hemiplegia and hemiparesis Hemiplegia - Right/Left: Right Hemiplegia - dominant/non-dominant: Dominant Hemiplegia - caused by: Cerebral infarction     Time: 1217-1249 PT Time Calculation (min) (ACUTE ONLY): 32 min  Charges:  $Therapeutic Activity: 8-22 mins                     Terryann Verbeek A. Gilford Rile PT, DPT Acute Rehabilitation Services Office [phone]    Linna Hoff 10/01/2021, 2:46 PM

## 2021-10-01 NOTE — Care Management Important Message (Signed)
Important Message  Patient Details  Name: Erica Mcdaniel MRN: [id number] Date of Birth: December 04, 1970   Medicare Important Message Given:  Yes     Orbie Pyo 10/01/2021, 12:19 PM

## 2021-10-01 NOTE — Evaluation (Deleted)
Occupational Therapy Evaluation Patient Details Name: Erica Mcdaniel MRN: [id number] DOB: [birth year] Today's Date: 10/01/2021   History of Present Illness Pt is a 51 y.o. female who presented 09/25/21 with R-sided weakness, aphasia, L gaze deviation, and R hemianopsia. Pt outside window for TNKase. MRI revealed large acute ischemic left MCA distribution infarct, small volume acute ischemic right ACA/MCA watershed distribution infarct, and small soft tissue contusion at the right frontotemporal scalp. S/p cerebral angiogram 8/29. ETT 8/29-8/30. PMH: GERD, HTN, IBS, polycistic ovarian syndrome, arthritis, morbid obesity, cancer, fibromyalgia, anxiety   Clinical Impression   Patient continues to make incremental progress towards goals in skilled OT session. Patient's session encompassed co-treat with PT in order to use two sets of skilled hands due to need for significant positioning and handling due to neurological deficits. Patient remains total A for transfers, however significant improvement noted when using mirror this session. Patient able to correct balance and sit EOB with almost full independence for 15 plus minutes in session with no pushing noted. Patient also able to demonstrate minimal reactive balance when R lateral LOB balance occurred (min-mod A to correct). Patient able to wipe L side of face, could not wipe R side of face despite tactile cues and OT initiating movement, successfully combed hair x1, however kept figeting with comb in hand and could not cognitve piece the comb with the action together. Continue to endorse AIR placement; OT will continue to follow.        Recommendations for follow up therapy are one component of a multi-disciplinary discharge planning process, led by the attending physician.  Recommendations may be updated based on patient status, additional functional criteria and insurance authorization.   Follow Up Recommendations  Acute inpatient rehab  (3hours/day)    Assistance Recommended at Discharge Frequent or constant Supervision/Assistance  Patient can return home with the following A lot of help with walking and/or transfers;Two people to help with walking and/or transfers;A lot of help with bathing/dressing/bathroom;Two people to help with bathing/dressing/bathroom;Assistance with cooking/housework;Assistance with feeding;Direct supervision/assist for medications management;Assist for transportation;Direct supervision/assist for financial management;Help with stairs or ramp for entrance    Functional Status Assessment     Equipment Recommendations  Other (comment) (Defer to next venue of care)    Recommendations for Other Services       Precautions / Restrictions Precautions Precautions: Fall;Other (comment) Precaution Comments: watch SpO2; L mitten; cortrak Restrictions Weight Bearing Restrictions: No      Mobility Bed Mobility Overal bed mobility: Needs Assistance Bed Mobility: Rolling, Sidelying to Sit, Sit to Sidelying     Supine to sit: Total assist, +2 for physical assistance, +2 for safety/equipment Sit to supine: Total assist, +2 for physical assistance, +2 for safety/equipment   General bed mobility comments: better initiation by patient for trunk elevation but inadequate strength at this time to assist. TotalA+2 for all aspects of bed mobility. Mutlimodal cueing for reaching with L UE and moving L LE towards EOB    Transfers                          Balance Overall balance assessment: Needs assistance Sitting-balance support: Feet supported, Single extremity supported Sitting balance-Leahy Scale: Poor Sitting balance - Comments: dynamically poor but static fair this session. Patient with no pushing this session. Able to sit EOB with min guard after repositioning hips and placing R UE on bed. Able to utilize L UE to wash face with max cueing. With addition of  L LE LAQ, patient with R lateral LOB  requiring min-modA to recover. Patient does demonstrate reactive balance as she reaches for therapist hand or bed on L side when falling towards R                                   ADL either performed or assessed with clinical judgement   ADL Overall ADL's : Needs assistance/impaired Eating/Feeding: NPO   Grooming: Wash/dry hands;Wash/dry face;Maximal assistance;Cueing for safety;Cueing for sequencing;Brushing hair Grooming Details (indicate cue type and reason): patient able to wipe L side of face, could not wipe R side of face despite tactile cues and OT initiating movement, successfully combed hair x1, however kept figeting with comb in hand and could not cognitve piece the comb with the action together                               General ADL Comments: Max A for grooming, otherwise remains total A for remainder of ADLs     Vision         Perception     Praxis      Pertinent Vitals/Pain Pain Assessment Pain Assessment: Faces Faces Pain Scale: No hurt Pain Intervention(s): Monitored during session     Hand Dominance     Extremity/Trunk Assessment             Communication     Cognition Arousal/Alertness: Awake/alert Behavior During Therapy: Flat affect Overall Cognitive Status: Difficult to assess Area of Impairment: Attention, Following commands, Safety/judgement, Awareness, Problem solving                   Current Attention Level: Focused   Following Commands: Follows one step commands inconsistently, Follows one step commands with increased time Safety/Judgement: Decreased awareness of safety, Decreased awareness of deficits Awareness: Intellectual Problem Solving: Slow processing, Decreased initiation, Difficulty sequencing, Requires verbal cues, Requires tactile cues General Comments: keeping eyes open throughout session. Requires max multimodal cueing and has 5-10 second delay for processing. Responds better with  visual or tactile cueing.     General Comments       Exercises     Shoulder Instructions      Home Living                                          Prior Functioning/Environment                          OT Problem List:        OT Treatment/Interventions:      OT Goals(Current goals can be found in the care plan section) Acute Rehab OT Goals Patient Stated Goal: unable to state OT Goal Formulation: Patient unable to participate in goal setting Time For Goal Achievement: 10/10/21 Potential to Achieve Goals: Good  OT Frequency: Min 2X/week    Co-evaluation   Reason for Co-Treatment: Complexity of the patient's impairments (multi-system involvement);Necessary to address cognition/behavior during functional activity;For patient/therapist safety;To address functional/ADL transfers PT goals addressed during session: Mobility/safety with mobility;Balance OT goals addressed during session: ADL's and self-care      AM-PAC OT "6 Clicks" Daily Activity     Outcome Measure Help from another person eating meals?: Total  Help from another person taking care of personal grooming?: Total Help from another person toileting, which includes using toliet, bedpan, or urinal?: Total Help from another person bathing (including washing, rinsing, drying)?: Total Help from another person to put on and taking off regular upper body clothing?: Total Help from another person to put on and taking off regular lower body clothing?: Total 6 Click Score: 6   End of Session Equipment Utilized During Treatment: Other (comment) Insurance risk surveyor) Nurse Communication: Mobility status  Activity Tolerance: Patient tolerated treatment well Patient left: in bed;with call bell/phone within reach;with bed alarm set  OT Visit Diagnosis: Unsteadiness on feet (R26.81);Other abnormalities of gait and mobility (R26.89);Repeated falls (R29.6);History of falling (Z91.81);Pain                Time:  6045-4098 OT Time Calculation (min): 31 min Charges:  OT General Charges $OT Visit: 1 Visit OT Treatments $Self Care/Home Management : 8-22 mins  Corinne Ports E. Jerric Oyen, OTR/L Acute Rehabilitation Services [place] 10/01/2021, 2:57 PM

## 2021-10-01 NOTE — Progress Notes (Signed)
Inpatient Rehab Admissions Coordinator:    Pt. Is currently not at a level where I believe she could tolerate the intensity of CIR, but will follow for progress and participation with therapies.   Clemens Catholic, [place], [place] Admissions Coordinator  [phone] ([place]) [phone] (office)

## 2021-10-01 NOTE — Progress Notes (Signed)
Occupational Therapy Treatment Patient Details Name: Erica Mcdaniel MRN: [id number] DOB: 22-Dec-1970 Today's Date: 10/01/2021   History of present illness Pt is a 51 y.o. female who presented 09/25/21 with R-sided weakness, aphasia, L gaze deviation, and R hemianopsia. Pt outside window for TNKase. MRI revealed large acute ischemic left MCA distribution infarct, small volume acute ischemic right ACA/MCA watershed distribution infarct, and small soft tissue contusion at the right frontotemporal scalp. S/p cerebral angiogram 8/29. ETT 8/29-8/30. PMH: GERD, HTN, IBS, polycistic ovarian syndrome, arthritis, morbid obesity, cancer, fibromyalgia, anxiety   OT comments  Patient continues to make incremental progress towards goals in skilled OT session. Patient's session encompassed co-treat with PT in order to use two sets of skilled hands due to need for significant positioning and handling due to neurological deficits. Patient remains total A for transfers, however significant improvement noted when using mirror this session. Patient able to correct balance and sit EOB with almost full independence for 15 plus minutes in session with no pushing noted. Patient also able to demonstrate minimal reactive balance when R lateral LOB balance occurred (min-mod A to correct). Patient able to wipe L side of face, could not wipe R side of face despite tactile cues and OT initiating movement, successfully combed hair x1, however kept figeting with comb in hand and could not cognitve piece the comb with the action together. Continue to endorse AIR placement; OT will continue to follow.    Recommendations for follow up therapy are one component of a multi-disciplinary discharge planning process, led by the attending physician.  Recommendations may be updated based on patient status, additional functional criteria and insurance authorization.    Follow Up Recommendations  Acute inpatient rehab (3hours/day)     Assistance Recommended at Discharge Frequent or constant Supervision/Assistance  Patient can return home with the following  A lot of help with walking and/or transfers;Two people to help with walking and/or transfers;A lot of help with bathing/dressing/bathroom;Two people to help with bathing/dressing/bathroom;Assistance with cooking/housework;Assistance with feeding;Direct supervision/assist for medications management;Assist for transportation;Direct supervision/assist for financial management;Help with stairs or ramp for entrance   Equipment Recommendations  Other (comment) (Defer to next venue of care)    Recommendations for Other Services      Precautions / Restrictions Precautions Precautions: Fall;Other (comment) Precaution Comments: watch SpO2; L mitten; cortrak Restrictions Weight Bearing Restrictions: No       Mobility Bed Mobility Overal bed mobility: Needs Assistance Bed Mobility: Rolling, Sidelying to Sit, Sit to Sidelying     Supine to sit: Total assist, +2 for physical assistance, +2 for safety/equipment Sit to supine: Total assist, +2 for physical assistance, +2 for safety/equipment   General bed mobility comments: better initiation by patient for trunk elevation but inadequate strength at this time to assist. TotalA+2 for all aspects of bed mobility. Mutlimodal cueing for reaching with L UE and moving L LE towards EOB    Transfers                         Balance Overall balance assessment: Needs assistance Sitting-balance support: Feet supported, Single extremity supported Sitting balance-Leahy Scale: Poor Sitting balance - Comments: dynamically poor but static fair this session. Patient with no pushing this session. Able to sit EOB with min guard after repositioning hips and placing R UE on bed. Able to utilize L UE to wash face with max cueing. With addition of L LE LAQ, patient with R lateral LOB requiring min-modA to recover.  Patient does  demonstrate reactive balance as she reaches for therapist hand or bed on L side when falling towards R                                   ADL either performed or assessed with clinical judgement   ADL Overall ADL's : Needs assistance/impaired Eating/Feeding: NPO   Grooming: Wash/dry hands;Wash/dry face;Maximal assistance;Cueing for safety;Cueing for sequencing;Brushing hair Grooming Details (indicate cue type and reason): patient able to wipe L side of face, could not wipe R side of face despite tactile cues and OT initiating movement, successfully combed hair x1, however kept figeting with comb in hand and could not cognitve piece the comb with the action together                               General ADL Comments: Max A for grooming, otherwise remains total A for remainder of ADLs    Extremity/Trunk Assessment              Vision       Perception     Praxis      Cognition Arousal/Alertness: Awake/alert Behavior During Therapy: Flat affect Overall Cognitive Status: Difficult to assess Area of Impairment: Attention, Following commands, Safety/judgement, Awareness, Problem solving                   Current Attention Level: Focused   Following Commands: Follows one step commands inconsistently, Follows one step commands with increased time Safety/Judgement: Decreased awareness of safety, Decreased awareness of deficits Awareness: Intellectual Problem Solving: Slow processing, Decreased initiation, Difficulty sequencing, Requires verbal cues, Requires tactile cues General Comments: keeping eyes open throughout session. Requires max multimodal cueing and has 5-10 second delay for processing. Responds better with visual or tactile cueing.        Exercises      Shoulder Instructions       General Comments      Pertinent Vitals/ Pain       Pain Assessment Pain Assessment: Faces Faces Pain Scale: No hurt Pain Intervention(s):  Monitored during session  Home Living                                          Prior Functioning/Environment              Frequency  Min 2X/week        Progress Toward Goals  OT Goals(current goals can now be found in the care plan section)  Progress towards OT goals: Progressing toward goals  Acute Rehab OT Goals Patient Stated Goal: unable to state OT Goal Formulation: Patient unable to participate in goal setting Time For Goal Achievement: 10/10/21 Potential to Achieve Goals: Good  Plan Discharge plan remains appropriate;Frequency remains appropriate    Co-evaluation      Reason for Co-Treatment: Complexity of the patient's impairments (multi-system involvement);Necessary to address cognition/behavior during functional activity;For patient/therapist safety;To address functional/ADL transfers PT goals addressed during session: Mobility/safety with mobility;Balance OT goals addressed during session: ADL's and self-care      AM-PAC OT "6 Clicks" Daily Activity     Outcome Measure   Help from another person eating meals?: Total Help from another person taking care of personal grooming?: Total Help from another  person toileting, which includes using toliet, bedpan, or urinal?: Total Help from another person bathing (including washing, rinsing, drying)?: Total Help from another person to put on and taking off regular upper body clothing?: Total Help from another person to put on and taking off regular lower body clothing?: Total 6 Click Score: 6    End of Session Equipment Utilized During Treatment: Other (comment) Insurance risk surveyor)  OT Visit Diagnosis: Unsteadiness on feet (R26.81);Other abnormalities of gait and mobility (R26.89);Repeated falls (R29.6);History of falling (Z91.81);Pain   Activity Tolerance Patient tolerated treatment well   Patient Left in bed;with call bell/phone within reach;with bed alarm set   Nurse Communication Mobility  status        Time: 3845-3646 OT Time Calculation (min): 31 min  Charges: OT General Charges $OT Visit: 1 Visit OT Treatments $Self Care/Home Management : 8-22 mins  Corinne Ports E. Mandy Peeks, OTR/L Acute Rehabilitation Services [phone]   Ascencion Dike 10/01/2021, 3:05 PM

## 2021-10-01 NOTE — Progress Notes (Signed)
Speech Language Pathology Treatment: Dysphagia;Cognitive-Linquistic  Patient Details Name: Erica Mcdaniel MRN: [id number] DOB: 11-02-1970 Today's Date: 10/01/2021 Time: 1194-1740 SLP Time Calculation (min) (ACUTE ONLY): 18 min  Assessment / Plan / Recommendation Clinical Impression  Pt was alert, attentive to both left and right sides.  Oral care provided; pt able to hold toothbrush with LUE and assist with brushing.  Ice chips and sips of water provided.  Inconsistent coughing demonstrated with liquids.  There was good attention to straw and spoon, with improved awareness and mastication of ice chips. Observable swallow was present for each bolus. Pt will be ready for MBS next 24-48 hours.  Recommend that she be provided with ice chips throughout the day to encourage swallowing and improve condition of oral cavity (in conjunction with oral care.)  Ms. Simoneau demonstrated occasional ability to mimic commands (opening mouth, sticking out tongue) with max visual/verbal/tactile cues.  There was no volitional nor spontaneous voicing noted today.  She used some facial expressions, raising her eyebrows and smiling.  Global aphasia remains present.    SLP will continue to follow for communication and swallowing.    HPI HPI: Pt is a 51 y.o. female who presented 09/25/21 with R-sided weakness, aphasia, L gaze deviation, and R hemianopsia. MRI brain revealed large acute ischemic left MCA distribution infarct, small volume acute ischemic right ACA/MCA watershed distribution infarct, and small soft tissue contusion at the right frontotemporal scalp. Mechanical thrombectomy was performed with MCA TICI 2b recanalization; occlusion of L distal M2/MCA and partial occlusion of distal A2 branches of L ACA noted with TICI 2c recanalization. Stent x 2 to L carotid artery placed. ETT 8/29-8/30. PMH: GERD, HTN, IBS, polycistic ovarian syndrome, arthritis, morbid obesity, cancer, fibromyalgia, anxiety      SLP  Plan  Continue with current plan of care      Recommendations for follow up therapy are one component of a multi-disciplinary discharge planning process, led by the attending physician.  Recommendations may be updated based on patient status, additional functional criteria and insurance authorization.    Recommendations  Diet recommendations: NPO Medication Administration: Via alternative means    Allow ice chips             Oral Care Recommendations: Oral care QID Follow Up Recommendations: Acute inpatient rehab (3hours/day) Assistance recommended at discharge: Frequent or constant Supervision/Assistance SLP Visit Diagnosis: Aphasia (R47.01);Dysphagia, oropharyngeal phase (R13.12) Plan: Continue with current plan of care         Kenesha Moshier L. Tivis Ringer, MA CCC/SLP Clinical Specialist - Acute Care SLP Acute Rehabilitation Services Office number [phone]   Juan Quam Laurice  10/01/2021, 11:48 AM

## 2021-10-02 ENCOUNTER — Ambulatory Visit: Payer: Medicare HMO | Admitting: Orthopaedic Surgery

## 2021-10-02 ENCOUNTER — Encounter (HOSPITAL_COMMUNITY): Payer: Medicare HMO

## 2021-10-02 DIAGNOSIS — I63512 Cerebral infarction due to unspecified occlusion or stenosis of left middle cerebral artery: Secondary | ICD-10-CM | POA: Diagnosis not present

## 2021-10-02 DIAGNOSIS — F17200 Nicotine dependence, unspecified, uncomplicated: Secondary | ICD-10-CM | POA: Diagnosis not present

## 2021-10-02 DIAGNOSIS — E538 Deficiency of other specified B group vitamins: Secondary | ICD-10-CM | POA: Diagnosis not present

## 2021-10-02 DIAGNOSIS — D72829 Elevated white blood cell count, unspecified: Secondary | ICD-10-CM | POA: Diagnosis not present

## 2021-10-02 LAB — CBC
HCT: 39.1 % (ref 36.0–46.0)
Hemoglobin: 13.1 g/dL (ref 12.0–15.0)
MCH: 30.4 pg (ref 26.0–34.0)
MCHC: 33.5 g/dL (ref 30.0–36.0)
MCV: 90.7 fL (ref 80.0–100.0)
Platelets: 375 10*3/uL (ref 150–400)
RBC: 4.31 MIL/uL (ref 3.87–5.11)
RDW: 14.4 % (ref 11.5–15.5)
WBC: 18 10*3/uL — ABNORMAL HIGH (ref 4.0–10.5)
nRBC: 0 % (ref 0.0–0.2)

## 2021-10-02 LAB — GLUCOSE, CAPILLARY
Glucose-Capillary: 197 mg/dL — ABNORMAL HIGH (ref 70–99)
Glucose-Capillary: 161 mg/dL — ABNORMAL HIGH (ref 70–99)
Glucose-Capillary: 128 mg/dL — ABNORMAL HIGH (ref 70–99)
Glucose-Capillary: 134 mg/dL — ABNORMAL HIGH (ref 70–99)
Glucose-Capillary: 126 mg/dL — ABNORMAL HIGH (ref 70–99)

## 2021-10-02 LAB — BASIC METABOLIC PANEL
Anion gap: 10 (ref 5–15)
BUN: 23 mg/dL — ABNORMAL HIGH (ref 6–20)
CO2: 22 mmol/L (ref 22–32)
Calcium: 9.6 mg/dL (ref 8.9–10.3)
Chloride: 113 mmol/L — ABNORMAL HIGH (ref 98–111)
Creatinine, Ser: 0.69 mg/dL (ref 0.44–1.00)
GFR, Estimated: >60 mL/min (ref >60)
Glucose, Bld: 137 mg/dL — ABNORMAL HIGH (ref 70–99)
Potassium: 3.7 mmol/L (ref 3.5–5.1)
Sodium: 145 mmol/L (ref 135–145)

## 2021-10-02 LAB — TSH: TSH: 3.056 u[IU]/mL (ref 0.350–4.500)

## 2021-10-02 LAB — RPR: RPR Ser Ql: NONREACTIVE

## 2021-10-02 LAB — VITAMIN B12: Vitamin B-12: 161 pg/mL — ABNORMAL LOW (ref 180–914)

## 2021-10-02 MED ORDER — LISINOPRIL 20 MG PO TABS
20.0000 mg | ORAL_TABLET | Freq: Every day | ORAL | Status: DC
Start: 2021-10-02 — End: 2021-10-11
  Administered 2021-10-02 – 2021-10-10 (×9): 20 mg
  Filled 2021-10-02 (×9): qty 1

## 2021-10-02 MED ORDER — CYANOCOBALAMIN 1000 MCG/ML IJ SOLN
1000.0000 ug | Freq: Once | INTRAMUSCULAR | Status: AC
  Administered 2021-10-02: 1000 ug via INTRAMUSCULAR
  Filled 2021-10-02: qty 1

## 2021-10-02 MED ORDER — VITAMIN B-12 100 MCG PO TABS
500.0000 ug | ORAL_TABLET | Freq: Every day | ORAL | Status: DC
  Administered 2021-10-03 – 2021-10-10 (×8): 500 ug
  Filled 2021-10-02 (×8): qty 5

## 2021-10-02 MED ORDER — AMANTADINE HCL 50 MG/5ML PO SOLN
200.0000 mg | Freq: Two times a day (BID) | ORAL | Status: DC
  Administered 2021-10-02 – 2021-10-08 (×13): 200 mg
  Filled 2021-10-02 (×14): qty 20

## 2021-10-02 NOTE — Progress Notes (Signed)
STROKE TEAM PROGRESS NOTE   INTERVAL HISTORY No family is at the bedside. Pt lying in bed, lethargic, drowsy and sleepy, able to briefly open eyes on voice, but can not keep eyes open without constant stimulation. Still has left gaze preference and right hemiplegia with global aphasia. Afebrile, but leukocytosis worsened. Will check LE venous doppler.   Vitals:   10/01/21 2332 10/02/21 0426 10/02/21 0842 10/02/21 1115  BP: (!) 154/77 (!) 145/79 (!) 148/76 (!) 155/72  Pulse: 84 80 79 80  Resp: '20 20 20 20  '$ Temp: 98.4 F (36.9 C) 98.2 F (36.8 C) 98.2 F (36.8 C) 98.2 F (36.8 C)  TempSrc: Oral Oral Oral Oral  SpO2: 97% 98%  97%  Weight:      Height:       CBC:  Recent Labs  Lab 10/01/21 0345 10/02/21 0608  WBC 15.1* 18.0*  HGB 13.0 13.1  HCT 38.4 39.1  MCV 90.6 90.7  PLT 338 938   Basic Metabolic Panel:  Recent Labs  Lab 09/28/21 0600 09/28/21 1643 09/29/21 0442 10/01/21 0345 10/02/21 0608  NA  --   --    < > 146* 145  K  --   --    < > 3.5 3.7  CL  --   --    < > 114* 113*  CO2  --   --    < > 23 22  GLUCOSE  --   --    < > 164* 137*  BUN  --   --    < > 20 23*  CREATININE  --   --    < > 0.66 0.69  CALCIUM  --   --    < > 9.3 9.6  MG 2.5* 2.5*  --   --   --   PHOS 3.2 3.2  --   --   --    < > = values in this interval not displayed.   Lipid Panel:  Recent Labs  Lab 09/27/21 0543  CHOL 233*  TRIG 243*  HDL 45  CHOLHDL 5.2  VLDL 49*  LDLCALC 139*   HgbA1c:  No results for input(s): "HGBA1C" in the last 168 hours. Urine Drug Screen:  No results for input(s): "LABOPIA", "COCAINSCRNUR", "LABBENZ", "AMPHETMU", "THCU", "LABBARB" in the last 168 hours.  Alcohol Level  No results for input(s): "ETH" in the last 168 hours.  IMAGING past 24 hours No results found.  PHYSICAL EXAM  Physical Exam  Constitutional: Obese caucasian female in no acute distress Respiratory: Respirations regular and unlabored on room air  Neuro: Lethargic, initially  sleeping but arousable briefly with voice. Global aphasia and nonverbal, not following commands. Left gaze preference but able to track bilaterally, more consistently blinking to visual threat on the left but not blinking to the right. Right facial droop. Tongue protrusion not cooperative. Moving LUE and LLE spontaneously and against gravity. Right hemiplegia, decreased muscle tone. Sensation, coordination and gait not tested    ASSESSMENT/PLAN Erica Mcdaniel is a 51 y.o. female with history of GERD, hypertension, IBS, polycystic ovarian syndrome, arthritis, morbid obesity presenting with right sided weakness, left gaze deviation, right hemianopsia and expressive aphasia after a fall.  CT showed dense left MCA and she was taken for emergent mechanical thrombectomy. Remained intubated post procedure. Extubated 8/30. PT/OT/ST pending  Stroke:  Left MCA and b/l ACA infarcts due to L ICA, M1 and A2/3 occlusion s/p IR with rescue proximal L ICA stent, etiology likely  large vessel disease form left ICA atherosclerosis, but ddx including cardioembolic source Code Stroke CT head hyperdense left MCA consistent with thrombosis. ASPECTS 10.    CTA head & neck Emergent large vessel occlusion at the left ICA origin. Short segment of intracranial ICA reconstitution with subsequent left MCA thrombosis. Atherosclerosis. Suspect moderate narrowing at the right V1 segment. S/p IR with TICI2b on left M2 and TICI2c on left A2 MRI Large evolving acute ischemic left MCA distribution infarct. Additional small bilateral ACA and/or ACA/MCA watershed distribution infarct.  MRA  Interval revascularization of previously seen left ICA/MCA occlusion. Focal flow defect at the distal cervical left ICA Carotid Doppler  Left distal ICA stent consistent with 50-75% stenosis. 2D Echo EF 65-70% Recommend 30 day monitoring as outpt to rule out afib LDL 137 HgbA1c 5.8 UDS positive for THC Hypercoagulable work up pending VTE  prophylaxis - Lovenox No antithrombotic prior to admission, now on aspirin 81 mg daily and Brilinta (ticagrelor) 90 mg bid post ICA stenting Therapy recommendations:  CIR vs. SNF Disposition:  pending  Hypertension Home meds:  hydrochlorothiazide Stable on the high end On lisinopril 10->20 Long-term BP goal normotensive  Hyperlipidemia Home meds: Zocor 10 and zetia LDL 137, goal < 70 Now on Zetia '10mg'$ , Crestor '20mg'$  Continue statin on discharge  Dysphagia Did not pass swallow on TF @ 50 May need PEG if going to SNF  Leukocytosis  WBC 14.2->15.1->15.9->15.1->18 UA negative CXR Diffuse interstitial opacities Afebrile  Check LE venous doppler  Continue monitoring  Tobacco abuse Current smoker Smoking cessation counseling will be provided  B12 deficiency B12 = 161 B12 supplement with B12 1065mg IM x 1 Continue B12 10062m po daily  Other Stroke Risk Factors THC use, cessation education will be provided Obesity, Body mass index is 37.85 kg/m., BMI >/= 30 associated with increased stroke risk, recommend weight loss, diet and exercise as appropriate   Other Active Problems Chronic lower back pain with right sided sciatica, fibromyalgia Home meds: Tramadol, zanaflex, cyclobenzaprine, celebrex IBS- on bentyl- hold for now GERD- protonix  Lethargy - will add amantadine '200mg'$  bid for arousal .   Hospital day # 7  Patient continues to have worsening leukocytosis and drowsy sleepy. I spent  extensive total face-to-face time with the patient, more than 50% of which was spent in counseling and coordination of care, reviewing test results, images and medication, and discussing the diagnosis, treatment plan and potential prognosis. This patient's care requiresreview of multiple databases, neurological assessment, discussion with family, other specialists and medical decision making of high complexity.  JiRosalin HawkingMD PhD Stroke Neurology 10/02/2021 1:40 PM    To contact  Stroke Continuity provider, please refer to [email or web address] hours, contact General Neurology

## 2021-10-02 NOTE — Progress Notes (Signed)
Inpatient Rehab Admissions Coordinator:   Pt continues to demonstrate slow progress and low tolerance for CIR.  Will sign off at this time.  If pt shows significant progress prior to discharge, please feel free to contact me and I will reassess.   Shann Medal, PT, DPT Admissions Coordinator [phone] 10/02/21  2:13 PM

## 2021-10-02 NOTE — Progress Notes (Signed)
Speech Language Pathology Treatment: Dysphagia  Patient Details Name: Erica Mcdaniel MRN: [id number] DOB: 23-Jan-1971 Today's Date: 10/02/2021 Time: 1017-1029 SLP Time Calculation (min) (ACUTE ONLY): 12 min  Assessment / Plan / Recommendation Clinical Impression  SLP provided oral care with removal of thick secretions that were adhered to pt's hard palate. Although pt does not follow commands, she spontaneously opens her mouth in anticipation of a toothbrush and POs. She initiates oral preparation but seems to lose attention to mastication of ice chips at times. There is no overt coughing noted today with ice chips or water, as was intermittently observed on previous date. She would still benefit from MBS to better evaluate oropharyngeal swallow prior to initiating PO diet. Will complete as can be scheduled, still allowing ice chips with full supervision after oral care to provide more moisture to her oral cavity/pharynx.    HPI HPI: Pt is a 51 y.o. female who presented 09/25/21 with R-sided weakness, aphasia, L gaze deviation, and R hemianopsia. MRI brain revealed large acute ischemic left MCA distribution infarct, small volume acute ischemic right ACA/MCA watershed distribution infarct, and small soft tissue contusion at the right frontotemporal scalp. Mechanical thrombectomy was performed with MCA TICI 2b recanalization; occlusion of L distal M2/MCA and partial occlusion of distal A2 branches of L ACA noted with TICI 2c recanalization. Stent x 2 to L carotid artery placed. ETT 8/29-8/30. PMH: GERD, HTN, IBS, polycistic ovarian syndrome, arthritis, morbid obesity, cancer, fibromyalgia, anxiety      SLP Plan  MBS      Recommendations for follow up therapy are one component of a multi-disciplinary discharge planning process, led by the attending physician.  Recommendations may be updated based on patient status, additional functional criteria and insurance authorization.    Recommendations   Diet recommendations: NPO;Other(comment) (few ice chips after oral care) Medication Administration: Via alternative means                Oral Care Recommendations: Oral care QID Follow Up Recommendations: Acute inpatient rehab (3hours/day) Assistance recommended at discharge: Frequent or constant Supervision/Assistance SLP Visit Diagnosis: Dysphagia, oropharyngeal phase (R13.12) Plan: MBS           Osie Bond., M.A. [place] Office [phone]  Secure chat preferred   10/02/2021, 10:32 AM

## 2021-10-02 NOTE — Progress Notes (Signed)
Physical Therapy Treatment Patient Details Name: Erica Mcdaniel MRN: [id number] DOB: 1971-01-20 Today's Date: 10/02/2021   History of Present Illness Pt is a 51 y.o. female who presented 09/25/21 with R-sided weakness, aphasia, L gaze deviation, and R hemianopsia. Pt outside window for TNKase. MRI revealed large acute ischemic left MCA distribution infarct, small volume acute ischemic right ACA/MCA watershed distribution infarct, and small soft tissue contusion at the right frontotemporal scalp. S/p cerebral angiogram 8/29. ETT 8/29-8/30. PMH: GERD, HTN, IBS, polycistic ovarian syndrome, arthritis, morbid obesity, cancer, fibromyalgia, anxiety    PT Comments    Spoke with MD prior to entering who reported increase in pt's WBC count as well as increased lethargy and plan for ultrasound of LEs this afternoon to rule out DVT. Upon entering, pt sleeping hard and difficult to arouse, only able to open eyes briefly to therapist's voice, then immediately closed them. Pt unable to follow commands to roll L/R and assist with repositioning in bed - therefore provided total A to position hemiparetic UE and get LEs in middle of bed. Deferred sitting EOB or transfers today due to safety concerns associated with pt's level of arousal. Acute PT to cont to follow.    Recommendations for follow up therapy are one component of a multi-disciplinary discharge planning process, led by the attending physician.  Recommendations may be updated based on patient status, additional functional criteria and insurance authorization.  Follow Up Recommendations  Acute inpatient rehab (3hours/day)     Assistance Recommended at Discharge Frequent or constant Supervision/Assistance  Patient can return home with the following Two people to help with walking and/or transfers;Two people to help with bathing/dressing/bathroom;Assistance with cooking/housework;Assistance with feeding;Direct supervision/assist for medications  management;Direct supervision/assist for financial management;Assist for transportation;Help with stairs or ramp for entrance   Equipment Recommendations  Rolling walker (2 wheels);BSC/3in1;Wheelchair (measurements PT);Wheelchair cushion (measurements PT);Hospital bed;Other (comment) (hoyer lift pending progress)    Recommendations for Other Services Rehab consult     Precautions / Restrictions Precautions Precautions: Fall;Other (comment) Precaution Comments: watch SpO2; L mitten; cortrak, R hemiparesis Restrictions Weight Bearing Restrictions: No     Mobility  Bed Mobility               General bed mobility comments: Deferred due to level of arousal/lethargy. Pt's legs hanging off L side of bed and hemiparetic arm tucked under pt - therefore assisted with repositoning pt in middle of bed with legs in midline and placed pillow under heels for pressure relief and pillows under hemiparetic UE for edema and support. Pt unable to follow any cues to assist therapist with repsotioniong, only opening eyes briefly then falling back asleep.    Transfers                   General transfer comment: deferred due to level of arousal/lethargy    Ambulation/Gait                   Stairs             Wheelchair Mobility    Modified Rankin (Stroke Patients Only) Modified Rankin (Stroke Patients Only) Pre-Morbid Rankin Score: No symptoms Modified Rankin: Severe disability     Balance       Sitting balance - Comments: deferred due to level of arousal/lethargy  Cognition Arousal/Alertness: Lethargic   Overall Cognitive Status: Difficult to assess                                 General Comments: MD leaving upon therapist's entry stating pt's WBC count is high and she has been extremly lethargic. Plan for ultrasound this afternoon to rule out DVT. Pt opened eyes briefly to therapist's voice,  but then immedetely closed them and drifted off back to sleep. Pt with labored breathing - per MD due to COPD        Exercises      General Comments        Pertinent Vitals/Pain Pain Assessment Pain Assessment: Faces Faces Pain Scale: No hurt    Home Living                          Prior Function            PT Goals (current goals can now be found in the care plan section) Acute Rehab PT Goals Patient Stated Goal: did not state PT Goal Formulation: With patient Time For Goal Achievement: 10/11/21 Potential to Achieve Goals: Fair Progress towards PT goals: Progressing toward goals    Frequency    Min 4X/week      PT Plan Current plan remains appropriate    Co-evaluation              AM-PAC PT "6 Clicks" Mobility   Outcome Measure                   End of Session   Activity Tolerance: Patient limited by lethargy;Patient limited by fatigue Patient left: in bed;with call bell/phone within reach;with bed alarm set Nurse Communication: Mobility status PT Visit Diagnosis: Difficulty in walking, not elsewhere classified (R26.2);Unsteadiness on feet (R26.81);Muscle weakness (generalized) (M62.81);Other symptoms and signs involving the nervous system (R29.898);Hemiplegia and hemiparesis Hemiplegia - Right/Left: Right Hemiplegia - dominant/non-dominant: Dominant Hemiplegia - caused by: Cerebral infarction     Time: 1301-1310 PT Time Calculation (min) (ACUTE ONLY): 9 min  Charges:  $Therapeutic Activity: 8-22 mins                     Becky Sax PT, DPT  Blenda Nicely 10/02/2021, 1:21 PM

## 2021-10-03 ENCOUNTER — Inpatient Hospital Stay (HOSPITAL_COMMUNITY): Payer: Medicare HMO

## 2021-10-03 DIAGNOSIS — I10 Essential (primary) hypertension: Secondary | ICD-10-CM | POA: Diagnosis not present

## 2021-10-03 DIAGNOSIS — D72829 Elevated white blood cell count, unspecified: Secondary | ICD-10-CM | POA: Diagnosis not present

## 2021-10-03 DIAGNOSIS — F17200 Nicotine dependence, unspecified, uncomplicated: Secondary | ICD-10-CM | POA: Diagnosis not present

## 2021-10-03 DIAGNOSIS — I63512 Cerebral infarction due to unspecified occlusion or stenosis of left middle cerebral artery: Secondary | ICD-10-CM | POA: Diagnosis not present

## 2021-10-03 LAB — GLUCOSE, CAPILLARY
Glucose-Capillary: 110 mg/dL — ABNORMAL HIGH (ref 70–99)
Glucose-Capillary: 115 mg/dL — ABNORMAL HIGH (ref 70–99)
Glucose-Capillary: 134 mg/dL — ABNORMAL HIGH (ref 70–99)
Glucose-Capillary: 135 mg/dL — ABNORMAL HIGH (ref 70–99)
Glucose-Capillary: 146 mg/dL — ABNORMAL HIGH (ref 70–99)
Glucose-Capillary: 149 mg/dL — ABNORMAL HIGH (ref 70–99)
Glucose-Capillary: 195 mg/dL — ABNORMAL HIGH (ref 70–99)

## 2021-10-03 LAB — BASIC METABOLIC PANEL
Anion gap: 12 (ref 5–15)
BUN: 24 mg/dL — ABNORMAL HIGH (ref 6–20)
CO2: 23 mmol/L (ref 22–32)
Calcium: 9.8 mg/dL (ref 8.9–10.3)
Chloride: 113 mmol/L — ABNORMAL HIGH (ref 98–111)
Creatinine, Ser: 0.68 mg/dL (ref 0.44–1.00)
GFR, Estimated: >60 mL/min (ref >60)
Glucose, Bld: 129 mg/dL — ABNORMAL HIGH (ref 70–99)
Potassium: 3.6 mmol/L (ref 3.5–5.1)
Sodium: 148 mmol/L — ABNORMAL HIGH (ref 135–145)

## 2021-10-03 LAB — CBC
HCT: 41.3 % (ref 36.0–46.0)
Hemoglobin: 13.4 g/dL (ref 12.0–15.0)
MCH: 30 pg (ref 26.0–34.0)
MCHC: 32.4 g/dL (ref 30.0–36.0)
MCV: 92.4 fL (ref 80.0–100.0)
Platelets: 421 10*3/uL — ABNORMAL HIGH (ref 150–400)
RBC: 4.47 MIL/uL (ref 3.87–5.11)
RDW: 14.3 % (ref 11.5–15.5)
WBC: 15.6 10*3/uL — ABNORMAL HIGH (ref 4.0–10.5)
nRBC: 0 % (ref 0.0–0.2)

## 2021-10-03 LAB — CARDIOLIPIN ANTIBODIES, IGG, IGM, IGA
Anticardiolipin IgA: <9 APL U/mL (ref 0–11)
Anticardiolipin IgG: <9 GPL U/mL (ref 0–14)
Anticardiolipin IgM: <9 MPL U/mL (ref 0–12)

## 2021-10-03 LAB — HOMOCYSTEINE: Homocysteine: 14.5 umol/L (ref 0.0–14.5)

## 2021-10-03 LAB — LUPUS ANTICOAGULANT PANEL
DRVVT: 45.3 s (ref 0.0–47.0)
PTT Lupus Anticoagulant: 34.5 s (ref 0.0–43.5)

## 2021-10-03 LAB — BETA-2-GLYCOPROTEIN I ABS, IGG/M/A
Beta-2 Glyco I IgG: <9 GPI IgG units (ref 0–20)
Beta-2-Glycoprotein I IgA: <9 GPI IgA units (ref 0–25)
Beta-2-Glycoprotein I IgM: <9 GPI IgM units (ref 0–32)

## 2021-10-03 LAB — ANA W/REFLEX IF POSITIVE: Anti Nuclear Antibody (ANA): NEGATIVE

## 2021-10-03 NOTE — Progress Notes (Signed)
STROKE TEAM PROGRESS NOTE   INTERVAL HISTORY No family is at the bedside. Pt lying in bed, lethargic, but more awake alert than yesterday, able to keep eyes open. Still has left gaze preference and right hemiplegia with global aphasia. Afebrile, but leukocytosis improved from yesterday.  No DVT on LE venous doppler.   Vitals:   10/03/21 0600 10/03/21 0824 10/03/21 1138 10/03/21 1628  BP:  135/79 121/74 (!) 159/77  Pulse:  83 85 86  Resp:  '18 18 18  '$ Temp:  98.2 F (36.8 C) 98.6 F (37 C) 98.3 F (36.8 C)  TempSrc:  Oral Axillary   SpO2:  97% 94% 93%  Weight: 114.4 kg     Height:       CBC:  Recent Labs  Lab 10/02/21 0608 10/03/21 0448  WBC 18.0* 15.6*  HGB 13.1 13.4  HCT 39.1 41.3  MCV 90.7 92.4  PLT 375 893*   Basic Metabolic Panel:  Recent Labs  Lab 09/28/21 0600 09/28/21 1643 09/29/21 0442 10/02/21 0608 10/03/21 0448  NA  --   --    < > 145 148*  K  --   --    < > 3.7 3.6  CL  --   --    < > 113* 113*  CO2  --   --    < > 22 23  GLUCOSE  --   --    < > 137* 129*  BUN  --   --    < > 23* 24*  CREATININE  --   --    < > 0.69 0.68  CALCIUM  --   --    < > 9.6 9.8  MG 2.5* 2.5*  --   --   --   PHOS 3.2 3.2  --   --   --    < > = values in this interval not displayed.   Lipid Panel:  Recent Labs  Lab 09/27/21 0543  CHOL 233*  TRIG 243*  HDL 45  CHOLHDL 5.2  VLDL 49*  LDLCALC 139*   HgbA1c:  No results for input(s): "HGBA1C" in the last 168 hours. Urine Drug Screen:  No results for input(s): "LABOPIA", "COCAINSCRNUR", "LABBENZ", "AMPHETMU", "THCU", "LABBARB" in the last 168 hours.  Alcohol Level  No results for input(s): "ETH" in the last 168 hours.  IMAGING past 24 hours VAS [place] LOWER EXTREMITY VENOUS (DVT)  Result Date: 10/03/2021  Lower Venous DVT Study Patient Name:  JAIDIN UGARTE  Date of Exam:   10/03/2021 Medical Rec #: [id number]            Accession #:    [id number] Date of Birth: 11/09/1970            Patient Gender: F Patient Age:   51  years Exam Location:  [facility] Procedure:      VAS [place] LOWER EXTREMITY VENOUS (DVT) Referring Phys: Cornelius Moras Martell Mcfadyen --------------------------------------------------------------------------------  Indications: Leukocytosis.  Risk Factors: None identified. Comparison Study: No prior studies. Performing Technologist: Oliver Hum RVT  Examination Guidelines: A complete evaluation includes B-mode imaging, spectral Doppler, color Doppler, and power Doppler as needed of all accessible portions of each vessel. Bilateral testing is considered an integral part of a complete examination. Limited examinations for reoccurring indications may be performed as noted. The reflux portion of the exam is performed with the patient in reverse Trendelenburg.  +---------+---------------+---------+-----------+----------+--------------+ RIGHT    CompressibilityPhasicitySpontaneityPropertiesThrombus Aging +---------+---------------+---------+-----------+----------+--------------+ CFV      Full  Yes      Yes                                 +---------+---------------+---------+-----------+----------+--------------+ SFJ      Full                                                        +---------+---------------+---------+-----------+----------+--------------+ FV Prox  Full                                                        +---------+---------------+---------+-----------+----------+--------------+ FV Mid   Full                                                        +---------+---------------+---------+-----------+----------+--------------+ FV DistalFull                                                        +---------+---------------+---------+-----------+----------+--------------+ PFV      Full                                                        +---------+---------------+---------+-----------+----------+--------------+ POP      Full           Yes      Yes                                  +---------+---------------+---------+-----------+----------+--------------+ PTV      Full                                                        +---------+---------------+---------+-----------+----------+--------------+ PERO     Full                                                        +---------+---------------+---------+-----------+----------+--------------+   +---------+---------------+---------+-----------+----------+--------------+ LEFT     CompressibilityPhasicitySpontaneityPropertiesThrombus Aging +---------+---------------+---------+-----------+----------+--------------+ CFV      Full           Yes      Yes                                 +---------+---------------+---------+-----------+----------+--------------+ SFJ  Full                                                        +---------+---------------+---------+-----------+----------+--------------+ FV Prox  Full                                                        +---------+---------------+---------+-----------+----------+--------------+ FV Mid   Full                                                        +---------+---------------+---------+-----------+----------+--------------+ FV DistalFull                                                        +---------+---------------+---------+-----------+----------+--------------+ PFV      Full                                                        +---------+---------------+---------+-----------+----------+--------------+ POP      Full           Yes      Yes                                 +---------+---------------+---------+-----------+----------+--------------+ PTV      Full                                                        +---------+---------------+---------+-----------+----------+--------------+ PERO     Full                                                         +---------+---------------+---------+-----------+----------+--------------+     Summary: RIGHT: - There is no evidence of deep vein thrombosis in the lower extremity.  - No cystic structure found in the popliteal fossa.  LEFT: - There is no evidence of deep vein thrombosis in the lower extremity.  - No cystic structure found in the popliteal fossa.  *See table(s) above for measurements and observations.    Preliminary    DG Swallowing Func-Speech Pathology  Result Date: 10/03/2021 Table formatting from the original result was not included. Objective Swallowing Evaluation: Type of Study: MBS-Modified Barium Swallow Study  Patient Details Name: DANIQUA CAMPOY MRN: [id number] Date of Birth: 07-Sep-1970 Today's Date: 10/03/2021 Time: SLP Start Time (ACUTE ONLY):  1302 -SLP Stop Time (ACUTE ONLY): 1330 SLP Time Calculation (min) (ACUTE ONLY): 28 min Past Medical History: Past Medical History: Diagnosis Date  Anxiety   Arthritis   Cancer ([place])   skin squamous cell arm  Depression   Fibromyalgia   GERD (gastroesophageal reflux disease)   Hypertension   IBS (irritable bowel syndrome)   Pneumonia   hx  Polycystic ovarian disease   Tendonitis   Tubular adenoma of colon 2018  multiple Past Surgical History: Past Surgical History: Procedure Laterality Date  APPENDECTOMY    BACK SURGERY    BACK SURGERY  2010  rods put in  IR ANGIO INTRA EXTRACRAN SEL COM CAROTID INNOMINATE UNI R MOD SED  09/25/2021  IR CT HEAD LTD  09/25/2021  IR CT HEAD LTD  09/25/2021  IR INTRAVSC STENT CERV CAROTID W/O EMB-PROT MOD SED INC ANGIO  09/25/2021  IR PERCUTANEOUS ART THROMBECTOMY/INFUSION INTRACRANIAL INC DIAG ANGIO  09/25/2021  IR US GUIDE VASC ACCESS LEFT  09/25/2021  IR US GUIDE VASC ACCESS RIGHT  09/25/2021  KNEE ARTHROSCOPY Left 13  NEUROPLASTY / TRANSPOSITION MEDIAN NERVE AT CARPAL TUNNEL BILATERAL    OVARIAN CYST SURGERY  1983  RADIOLOGY WITH ANESTHESIA N/A 09/25/2021  Procedure: IR WITH ANESTHESIA;  Surgeon: Luanne Bras, MD;  Location: [place];  Service: Radiology;  Laterality: N/A;  ROTATOR CUFF REPAIR  2003,04  right, and elbow,left  TENDON RECONSTRUCTION Right 12/08/2012  Procedure: ELBOW TENDON RECONSTRUCTION/Right Lateral Epicondylar Debridement with Repair.;  Surgeon: Garald Balding, MD;  Location: [place];  Service: Orthopedics;  Laterality: Right;  Right Lateral Epicondylar Debridement with Repair.  TRIGGER FINGER RELEASE Right 08 HPI: Pt is a 51 y.o. female who presented 09/25/21 with R-sided weakness, aphasia, L gaze deviation, and R hemianopsia. MRI brain revealed large acute ischemic left MCA distribution infarct, small volume acute ischemic right ACA/MCA watershed distribution infarct, and small soft tissue contusion at the right frontotemporal scalp. Mechanical thrombectomy was performed with MCA TICI 2b recanalization; occlusion of L distal M2/MCA and partial occlusion of distal A2 branches of L ACA noted with TICI 2c recanalization. Stent x 2 to L carotid artery placed. ETT 8/29-8/30. PMH: GERD, HTN, IBS, polycistic ovarian syndrome, arthritis, morbid obesity, cancer, fibromyalgia, anxiety  No data recorded  Recommendations for follow up therapy are one component of a multi-disciplinary discharge planning process, led by the attending physician.  Recommendations may be updated based on patient status, additional functional criteria and insurance authorization. Assessment / Plan / Recommendation   10/03/2021   1:56 PM Clinical Impressions Clinical Impression Despite the attempts of the radiology tech and SLP, the view of the pt's larynx and airway was partially obscured by her left shoulder. She presented with oropharyngeal dysphagia characterized by impaired bolus cohesion, weak lingual manipulation, and a pharyngeal delay. She demonstrated premature spillage to the valleculae and pyriform sinuses with liquids. Penetration (PAS 3, 5) and trace silent aspiration (PAS 8) were noted with thin liquids. Penetration (PAS 2,3,5) was also  observed with nectar thick liquids and a single instance of silent aspiration (PAS 8) was observed with a large bolus of nectar thick liquids. Subtle throat clearing was noted following aspiration of nectar thick liquids; the effectiveness of this could not be confirmed due to the view, but SLP, considering its weakness, suspects that it was ineffective. Laryngeal invasion was improved to PAS 2,3 when a chin tuck was used with nectar thick liquids and it was inconsistently eliminated when a reduced bolus size was used. Subsequent  aspiration of penetrated nectar thick liquids could not be ruled out due to the reduced view of the larynx. No instances of penetration/aspiration were noted with thin liquids via spoon or with honey thick liquids. Considering the suboptimal visualization during the study and pt's reduced laryngeal sensation, a more conservative diet of dysphagia 2 solids and honey thick liquids will be initiated at this time with potential for advancement of liquids if pt is able to observe precautions. SLP will continue to follow for treatment. SLP Visit Diagnosis Dysphagia, oropharyngeal phase (R13.12) Impact on safety and function Mild aspiration risk;Moderate aspiration risk     10/03/2021   1:56 PM Treatment Recommendations Treatment Recommendations Therapy as outlined in treatment plan below     10/03/2021   1:56 PM Prognosis Prognosis for Safe Diet Advancement Good Barriers to Reach Goals Severity of deficits;Language deficits   10/03/2021   1:56 PM Diet Recommendations SLP Diet Recommendations Dysphagia 2 (Fine chop) solids;Honey thick liquids Liquid Administration via Cup;Straw Medication Administration Crushed with puree Compensations Slow rate;Small sips/bites;Follow solids with liquid Postural Changes Seated upright at 90 degrees     10/03/2021   1:56 PM Other Recommendations Oral Care Recommendations Oral care BID Follow Up Recommendations Acute inpatient rehab (3hours/day) Assistance recommended at  discharge Frequent or constant Supervision/Assistance Functional Status Assessment Patient has had a recent decline in their functional status and demonstrates the ability to make significant improvements in function in a reasonable and predictable amount of time.   10/03/2021   1:56 PM Frequency and Duration  Speech Therapy Frequency (ACUTE ONLY) min 2x/week Treatment Duration 2 weeks     10/03/2021   1:56 PM Oral Phase Oral Phase Impaired Oral - Honey Cup Lingual pumping;Weak lingual manipulation;Decreased bolus cohesion;Premature spillage Oral - Nectar Cup Lingual pumping;Weak lingual manipulation;Decreased bolus cohesion;Premature spillage Oral - Thin Teaspoon Lingual pumping;Weak lingual manipulation;Decreased bolus cohesion;Premature spillage Oral - Thin Cup Lingual pumping;Weak lingual manipulation;Decreased bolus cohesion;Premature spillage Oral - Puree Weak lingual manipulation Oral - Mech Soft Weak lingual manipulation Oral - Pill Lingual pumping;Weak lingual manipulation;Decreased bolus cohesion;Premature spillage    10/03/2021   1:56 PM Pharyngeal Phase Pharyngeal Phase Impaired Pharyngeal- Honey Cup Delayed swallow initiation-vallecula Pharyngeal- Nectar Cup Delayed swallow initiation-vallecula;Delayed swallow initiation-pyriform sinuses;Penetration/Aspiration during swallow;Penetration/Aspiration before swallow Pharyngeal Material enters airway, remains ABOVE vocal cords and not ejected out;Material enters airway, CONTACTS cords and not ejected out Pharyngeal- Nectar Straw Delayed swallow initiation-vallecula;Delayed swallow initiation-pyriform sinuses;Penetration/Aspiration during swallow;Penetration/Aspiration before swallow Pharyngeal Material enters airway, remains ABOVE vocal cords and not ejected out;Material enters airway, CONTACTS cords and not ejected out Pharyngeal- Thin Teaspoon Delayed swallow initiation-vallecula;Delayed swallow initiation-pyriform sinuses Pharyngeal Material does not enter  airway Pharyngeal- Thin Cup Delayed swallow initiation-vallecula;Delayed swallow initiation-pyriform sinuses;Penetration/Aspiration during swallow Pharyngeal Material enters airway, passes BELOW cords without attempt by patient to eject out (silent aspiration);Material enters airway, remains ABOVE vocal cords and not ejected out;Material enters airway, CONTACTS cords and not ejected out Pharyngeal- Puree Delayed swallow initiation-vallecula Pharyngeal- Mechanical Soft Delayed swallow initiation-vallecula    10/03/2021   1:56 PM Cervical Esophageal Phase  Cervical Esophageal Phase [facility] Shanika I. Hardin Negus, [place], [place] Office number [phone] Horton Marshall 10/03/2021, 2:18 PM                      PHYSICAL EXAM  Physical Exam  Constitutional: Obese caucasian female in no acute distress Respiratory: Respirations regular and unlabored on room air  Neuro: Lethargic, initially sleeping but arousable briefly with voice. Global aphasia  and nonverbal, not following commands. Left gaze preference but able to track bilaterally, more consistently blinking to visual threat on the left but not blinking to the right. Right facial droop. Tongue protrusion not cooperative. Moving LUE and LLE spontaneously and against gravity. Right hemiplegia, decreased muscle tone. Sensation, coordination and gait not tested    ASSESSMENT/PLAN [name] is a 51 y.o. female with history of GERD, hypertension, IBS, polycystic ovarian syndrome, arthritis, morbid obesity presenting with right sided weakness, left gaze deviation, right hemianopsia and expressive aphasia after a fall.  CT showed dense left MCA and she was taken for emergent mechanical thrombectomy. Remained intubated post procedure. Extubated 8/30. PT/OT/ST pending  Stroke:  Left MCA and b/l ACA infarcts due to L ICA, M1 and A2/3 occlusion s/p IR with rescue proximal L ICA stent, etiology likely large vessel disease form left  ICA atherosclerosis, but ddx including cardioembolic source Code Stroke CT head hyperdense left MCA consistent with thrombosis. ASPECTS 10.    CTA head & neck Emergent large vessel occlusion at the left ICA origin. Short segment of intracranial ICA reconstitution with subsequent left MCA thrombosis. Atherosclerosis. Suspect moderate narrowing at the right V1 segment. S/p IR with TICI2b on left M2 and TICI2c on left A2 MRI Large evolving acute ischemic left MCA distribution infarct. Additional small bilateral ACA and/or ACA/MCA watershed distribution infarct.  MRA  Interval revascularization of previously seen left ICA/MCA occlusion. Focal flow defect at the distal cervical left ICA Carotid Doppler  Left distal ICA stent consistent with 50-75% stenosis. 2D Echo EF 65-70% Recommend 30 day monitoring as outpt to rule out afib LDL 137 HgbA1c 5.8 UDS positive for THC Hypercoagulable work up negative VTE prophylaxis - Lovenox No antithrombotic prior to admission, now on aspirin 81 mg daily and Brilinta (ticagrelor) 90 mg bid post ICA stenting Therapy recommendations:  CIR vs. SNF Disposition:  pending  Hypertension Home meds:  hydrochlorothiazide Stable on the high end On lisinopril 10->20 Long-term BP goal normotensive  Hyperlipidemia Home meds: Zocor 10 and zetia LDL 137, goal < 70 Now on Zetia '10mg'$ , Crestor '20mg'$  Continue statin on discharge  Dysphagia Passed swallow on dysphagia 2 and honey thick liquid Not sure if p.o. can be adequate Continue TF @ 61 for now May need PEG if going to SNF  Leukocytosis  WBC 14.2->15.1->15.9->15.1->18-> 15.6 UA negative CXR Diffuse interstitial opacities Afebrile  LE venous doppler no DVT Continue monitoring  Tobacco abuse Current smoker Smoking cessation counseling will be provided  B12 deficiency B12 = 161 B12 supplement with B12 1070mg IM x 1 Continue B12 100105m po daily  Other Stroke Risk Factors THC use, cessation education  will be provided Obesity, Body mass index is 38.35 kg/m., BMI >/= 30 associated with increased stroke risk, recommend weight loss, diet and exercise as appropriate   Other Active Problems Chronic lower back pain with right sided sciatica, fibromyalgia Home meds: Tramadol, zanaflex, cyclobenzaprine, celebrex IBS- on bentyl- hold for now GERD- protonix  Lethargy - added amantadine '200mg'$  bid for arousal .   Hospital day # 8   JiRosalin HawkingMD PhD Stroke Neurology 10/03/2021 6:01 PM    To contact Stroke Continuity provider, please refer to [email or web address] hours, contact General Neurology

## 2021-10-03 NOTE — Progress Notes (Signed)
Physical Therapy Treatment Patient Details Name: Erica Mcdaniel MRN: [id number] DOB: 08/17/1970 Today's Date: 10/03/2021   History of Present Illness Pt is a 51 y.o. female who presented 09/25/21 with R-sided weakness, aphasia, L gaze deviation, and R hemianopsia. Pt outside window for TNKase. MRI revealed large acute ischemic left MCA distribution infarct, small volume acute ischemic right ACA/MCA watershed distribution infarct, and small soft tissue contusion at the right frontotemporal scalp. S/p cerebral angiogram 8/29. ETT 8/29-8/30. PMH: GERD, HTN, IBS, polycistic ovarian syndrome, arthritis, morbid obesity, cancer, fibromyalgia, anxiety    PT Comments    Pt progressing slowly towards all goals. Pt remains to have dense R hemiparesis, R inattention, minimal ability to follow commands, impaired balance, impaired sequencing, impaired problem solving, and is non-verbal. Pt requiring max/totalAx2 for all mobility at this time. Acute PT to cont to follow.    Recommendations for follow up therapy are one component of a multi-disciplinary discharge planning process, led by the attending physician.  Recommendations may be updated based on patient status, additional functional criteria and insurance authorization.  Follow Up Recommendations  Acute inpatient rehab (3hours/day)     Assistance Recommended at Discharge Frequent or constant Supervision/Assistance  Patient can return home with the following Two people to help with walking and/or transfers;Two people to help with bathing/dressing/bathroom;Assistance with cooking/housework;Assistance with feeding;Direct supervision/assist for medications management;Direct supervision/assist for financial management;Assist for transportation;Help with stairs or ramp for entrance   Equipment Recommendations  Rolling walker (2 wheels);BSC/3in1;Wheelchair (measurements PT);Wheelchair cushion (measurements PT);Hospital bed;Other (comment) (hoyer lift  pending progress)    Recommendations for Other Services Rehab consult     Precautions / Restrictions Precautions Precautions: Fall;Other (comment) Precaution Comments: watch SpO2; L mitten; cortrak, R hemiparesis Restrictions Weight Bearing Restrictions: No     Mobility  Bed Mobility Overal bed mobility: Needs Assistance Bed Mobility: Rolling, Sidelying to Sit, Sit to Sidelying Rolling: Mod assist Sidelying to sit: Total assist, +2 for physical assistance, +2 for safety/equipment     Sit to sidelying: Total assist, +2 for physical assistance, +2 for safety/equipment General bed mobility comments: able to use LUE to hold rail and uses LLE to achieve sidelying position, totalA for trunk elevation, maxA for LE management of bed    Transfers                   General transfer comment: deferred    Ambulation/Gait               General Gait Details: deferred for safety due to poor command following and lethargy   Stairs             Wheelchair Mobility    Modified Rankin (Stroke Patients Only) Modified Rankin (Stroke Patients Only) Pre-Morbid Rankin Score: No symptoms Modified Rankin: Severe disability     Balance Overall balance assessment: Needs assistance Sitting-balance support: Feet supported, Single extremity supported Sitting balance-Leahy Scale: Poor Sitting balance - Comments: pushes with LUE, maxA to maintain EOB balance, max verbal and tactile cues with use of mirror for biofeedback to obtain midline posture however unable to maintain, pt sat EOB x5mn Postural control: Right lateral lean, Posterior lean     Standing balance comment: deferred for safety due to poor command following and lethargy                            Cognition Arousal/Alertness: Lethargic Behavior During Therapy: Flat affect Overall Cognitive Status: Impaired/Different from baseline Area of  Impairment: Attention, Following commands,  Safety/judgement, Awareness, Problem solving                   Current Attention Level: Focused   Following Commands: Follows one step commands with increased time, Follows one step commands inconsistently Safety/Judgement: Decreased awareness of safety, Decreased awareness of deficits Awareness: Intellectual Problem Solving: Slow processing, Decreased initiation, Difficulty sequencing, Requires verbal cues, Requires tactile cues General Comments: pt severe R inattention requiring max verbal and tactile cues to tend to R side, pt with apraxia and inability to follow commands to use comb however pt was able to use chapstick properly, pt remains non-verbal but did shake her head no appriopriately on 1 account        Exercises      General Comments General comments (skin integrity, edema, etc.): VSS on RA      Pertinent Vitals/Pain Pain Assessment Pain Assessment: Faces Faces Pain Scale: No hurt    Home Living                          Prior Function            PT Goals (current goals can now be found in the care plan section) Acute Rehab PT Goals PT Goal Formulation: With patient Time For Goal Achievement: 10/11/21 Potential to Achieve Goals: Fair Progress towards PT goals: Progressing toward goals    Frequency    Min 4X/week      PT Plan Current plan remains appropriate    Co-evaluation PT/OT/SLP Co-Evaluation/Treatment: Yes Reason for Co-Treatment: Complexity of the patient's impairments (multi-system involvement) PT goals addressed during session: Mobility/safety with mobility OT goals addressed during session: ADL's and self-care      AM-PAC PT "6 Clicks" Mobility   Outcome Measure  Help needed turning from your back to your side while in a flat bed without using bedrails?: A Lot Help needed moving from lying on your back to sitting on the side of a flat bed without using bedrails?: Total Help needed moving to and from a bed to a chair  (including a wheelchair)?: Total Help needed standing up from a chair using your arms (e.g., wheelchair or bedside chair)?: Total Help needed to walk in hospital room?: Total Help needed climbing 3-5 steps with a railing? : Total 6 Click Score: 7    End of Session   Activity Tolerance: Patient limited by lethargy;Patient limited by fatigue Patient left: in bed;with call bell/phone within reach;with bed alarm set Nurse Communication: Mobility status PT Visit Diagnosis: Difficulty in walking, not elsewhere classified (R26.2);Unsteadiness on feet (R26.81);Muscle weakness (generalized) (M62.81);Other symptoms and signs involving the nervous system (R29.898);Hemiplegia and hemiparesis Hemiplegia - Right/Left: Right Hemiplegia - dominant/non-dominant: Dominant Hemiplegia - caused by: Cerebral infarction     Time: 1100-1128 PT Time Calculation (min) (ACUTE ONLY): 28 min  Charges:  $Neuromuscular Re-education: 8-22 mins                     Kittie Plater, PT, DPT Acute Rehabilitation Services Secure chat preferred Office #: [phone]    Berline Lopes 10/03/2021, 1:13 PM

## 2021-10-03 NOTE — Progress Notes (Signed)
Pt continues to require mittens due to mental status.  Can not wear CPAP with restricted hands.  No distress noted.  RN aware.

## 2021-10-03 NOTE — Progress Notes (Addendum)
Occupational Therapy Treatment Patient Details Name: Erica Mcdaniel MRN: [id number] DOB: 1970/07/31 Today's Date: 10/03/2021   History of present illness Pt is a 51 y.o. female who presented 09/25/21 with R-sided weakness, aphasia, L gaze deviation, and R hemianopsia. Pt outside window for TNKase. MRI revealed large acute ischemic left MCA distribution infarct, small volume acute ischemic right ACA/MCA watershed distribution infarct, and small soft tissue contusion at the right frontotemporal scalp. S/p cerebral angiogram 8/29. ETT 8/29-8/30. PMH: GERD, HTN, IBS, polycistic ovarian syndrome, arthritis, morbid obesity, cancer, fibromyalgia, anxiety   OT comments  Pt progressing towards goals, increased command follow this session, needing mod-total A +2 for bed mobility, able to assist with rolling by bringing LUE to bedrail. Pt pushing with LUE while sitting EOB needing max A to remain upright, benefits from use of mirror for balance with seated grooming tasks for ~15 mins. When asked if pt wanted to continue oral care with suction brush, pt shaking head "no". Pt presenting with impairments listed below, will follow acutely. Continue to recommend AIR at d/c.   Recommendations for follow up therapy are one component of a multi-disciplinary discharge planning process, led by the attending physician.  Recommendations may be updated based on patient status, additional functional criteria and insurance authorization.    Follow Up Recommendations  Acute inpatient rehab (3hours/day)    Assistance Recommended at Discharge Frequent or constant Supervision/Assistance  Patient can return home with the following  A lot of help with walking and/or transfers;Two people to help with walking and/or transfers;A lot of help with bathing/dressing/bathroom;Two people to help with bathing/dressing/bathroom;Assistance with cooking/housework;Assistance with feeding;Direct supervision/assist for medications  management;Assist for transportation;Direct supervision/assist for financial management;Help with stairs or ramp for entrance   Equipment Recommendations  Other (comment) (defer)    Recommendations for Other Services Rehab consult    Precautions / Restrictions Precautions Precautions: Fall;Other (comment) Precaution Comments: watch SpO2; L mitten; cortrak, R hemiparesis Restrictions Weight Bearing Restrictions: No       Mobility Bed Mobility Overal bed mobility: Needs Assistance   Rolling: Mod assist Sidelying to sit: Total assist, +2 for physical assistance, +2 for safety/equipment     Sit to sidelying: Total assist, +2 for physical assistance, +2 for safety/equipment General bed mobility comments: able to use LUE to hold rail and uses LLE to achieve sidelying position    Transfers                   General transfer comment: deferred     Balance Overall balance assessment: Needs assistance Sitting-balance support: Feet supported, Single extremity supported Sitting balance-Leahy Scale: Poor Sitting balance - Comments: pushes with LUE Postural control: Right lateral lean, Posterior lean                                 ADL either performed or assessed with clinical judgement   ADL Overall ADL's : Needs assistance/impaired Eating/Feeding: NPO Eating/Feeding Details (indicate cue type and reason): pt with cortrak Grooming: Brushing hair;Oral care;Sitting;Maximal assistance Grooming Details (indicate cue type and reason): cues to comb R side of hair, opens mouth for use of toothbrush on suction                                    Extremity/Trunk Assessment Upper Extremity Assessment Upper Extremity Assessment: RUE deficits/detail RUE Deficits / Details: flaccid, PROM  WFL RUE Sensation: decreased light touch;decreased proprioception RUE Coordination: decreased fine motor;decreased gross motor LUE Deficits / Details: pushes strongly  with LUE   Lower Extremity Assessment Lower Extremity Assessment: Defer to PT evaluation        Vision   Vision Assessment?: No apparent visual deficits Additional Comments: L gaze preference, looks to R upon therapist entry to room and intermittently at EOB with mirror   Perception     Praxis Praxis Praxis: Impaired Praxis Impairment Details: Ideation Praxis-Other Comments: needing hand over hand assistance mulitiple times to perform task of combing hair, when given comb looks at it and rotates comb in hand    Cognition Arousal/Alertness: Lethargic Behavior During Therapy: Flat affect Overall Cognitive Status: Difficult to assess Area of Impairment: Attention, Following commands, Safety/judgement, Awareness, Problem solving                   Current Attention Level: Focused   Following Commands: Follows one step commands with increased time, Follows one step commands consistently Safety/Judgement: Decreased awareness of safety, Decreased awareness of deficits Awareness: Intellectual Problem Solving: Slow processing, Decreased initiation, Difficulty sequencing, Requires verbal cues, Requires tactile cues          Exercises Other Exercises Other Exercises: PROM R shoulder flex x5 Other Exercises: PROM R elbow flex/ext x5 Other Exercises: PROM R wrist flex/ext Other Exercises: PROM R digit composite flex/ext x5    Shoulder Instructions       General Comments VSS on RA    Pertinent Vitals/ Pain       Pain Assessment Pain Assessment: Faces Pain Intervention(s): Monitored during session, Repositioned  Home Living                                          Prior Functioning/Environment              Frequency  Min 2X/week        Progress Toward Goals  OT Goals(current goals can now be found in the care plan section)  Progress towards OT goals: Progressing toward goals  Acute Rehab OT Goals Patient Stated Goal: pt unable to  state OT Goal Formulation: Patient unable to participate in goal setting Time For Goal Achievement: 10/10/21 Potential to Achieve Goals: Good ADL Goals Pt Will Perform Grooming: with mod assist;sitting Pt Will Perform Upper Body Dressing: with max assist;sitting Pt Will Transfer to Toilet: with max assist;with +2 assist;stand pivot transfer;bedside commode Additional ADL Goal #1: Pt will locate at least 3 grooming items place just R of midline with moderate cues Additional ADL Goal #2: Pt will sit EOB with min A for at least 5 minutes as a precursor to ADLs  Plan Discharge plan remains appropriate;Frequency remains appropriate    Co-evaluation    PT/OT/SLP Co-Evaluation/Treatment: Yes Reason for Co-Treatment: Complexity of the patient's impairments (multi-system involvement);To address functional/ADL transfers;For patient/therapist safety   OT goals addressed during session: ADL's and self-care      AM-PAC OT "6 Clicks" Daily Activity     Outcome Measure   Help from another person eating meals?: Total Help from another person taking care of personal grooming?: A Lot Help from another person toileting, which includes using toliet, bedpan, or urinal?: Total Help from another person bathing (including washing, rinsing, drying)?: Total Help from another person to put on and taking off regular upper body clothing?: Total Help from another  person to put on and taking off regular lower body clothing?: Total 6 Click Score: 7    End of Session Equipment Utilized During Treatment: Other (comment) (mirror)  OT Visit Diagnosis: Unsteadiness on feet (R26.81);Other abnormalities of gait and mobility (R26.89);Repeated falls (R29.6);History of falling (Z91.81);Pain   Activity Tolerance Patient tolerated treatment well   Patient Left in bed;with call bell/phone within reach;with bed alarm set (L mitten reapplied)   Nurse Communication Mobility status        Time: 1027-2536 OT Time  Calculation (min): 27 min  Charges: OT General Charges $OT Visit: 1 Visit OT Treatments $Self Care/Home Management : 8-22 mins  Lynnda Child, OTD, OTR/L Acute Rehab [phone]) [phone] [place] 10/03/2021, 12:17 PM

## 2021-10-03 NOTE — Progress Notes (Signed)
Cpap not recommended due pt. Being nonverbal will continue to monitor

## 2021-10-03 NOTE — Progress Notes (Signed)
Modified Barium Swallow Progress Note  Patient Details  Name: Erica Mcdaniel MRN: [id number] Date of Birth: 05-May-1970  Today's Date: 10/03/2021  Modified Barium Swallow completed.  Full report located under Chart Review in the Imaging Section.  Brief recommendations include the following:  Clinical Impression  Despite the attempts of the radiology tech and SLP, the view of the pt's larynx and airway was partially obscured by her left shoulder. She presented with oropharyngeal dysphagia characterized by impaired bolus cohesion, weak lingual manipulation, and a pharyngeal delay. She demonstrated premature spillage to the valleculae and pyriform sinuses with liquids. Penetration (PAS 3, 5) and trace silent aspiration (PAS 8) were noted with thin liquids. Penetration (PAS 2,3,5) was also observed with nectar thick liquids and a single instance of silent aspiration (PAS 8) was observed with a large bolus of nectar thick liquids. Subtle throat clearing was noted following aspiration of nectar thick liquids; the effectiveness of this could not be confirmed due to the view, but SLP, considering its weakness, suspects that it was ineffective. Laryngeal invasion was improved to PAS 2,3 when a chin tuck was used with nectar thick liquids and it was inconsistently eliminated when a reduced bolus size was used. Subsequent aspiration of penetrated nectar thick liquids could not be ruled out due to the reduced view of the larynx. No instances of penetration/aspiration were noted with thin liquids via spoon or with honey thick liquids. Considering the suboptimal visualization during the study and pt's reduced laryngeal sensation, a more conservative diet of dysphagia 2 solids and honey thick liquids will be initiated at this time with potential for advancement of liquids if pt is able to observe precautions. SLP will continue to follow for treatment.    Swallow Evaluation Recommendations       SLP Diet  Recommendations: Dysphagia 2 (Fine chop) solids;Honey thick liquids   Liquid Administration via: Cup;Straw   Medication Administration: Crushed with puree   Supervision: Staff to assist with self feeding;Full assist for feeding   Compensations: Slow rate;Small sips/bites;Follow solids with liquid   Postural Changes: Seated upright at 90 degrees   Oral Care Recommendations: Oral care BID      Harveen Flesch I. Hardin Negus, [place], [place] Office number [phone]   Horton Marshall 10/03/2021,2:07 PM

## 2021-10-03 NOTE — Progress Notes (Signed)
Speech Language Pathology Treatment: Cognitive-Linquistic (Aphasia)  Patient Details Name: Erica Mcdaniel MRN: [id number] DOB: 1970-09-14 Today's Date: 10/03/2021 Time: 6226-3335 SLP Time Calculation (min) (ACUTE ONLY): 12 min  Assessment / Plan / Recommendation Clinical Impression  Pt was seen for aphasia treatment. She was alert for the majority of the session, but exhibited some difficulty maintaining this for prolonged periods if stimuli were removed. She followed 1-step commands with 25% accuracy given models. She accurately responded to simple yes/no questions with 25% accuracy using gestures when repetition was provided; with many questions pt either responded by raising her eye brows or no response was elicited. No verbal output was produced during the session despite prompts, cues, and use of facilitative techniques. Pt's modified barium swallow study is scheduled for today at 1300; diet recommendations will be deferred until it is completed, and SLP will continue to follow pt.    HPI HPI: Pt is a 51 y.o. female who presented 09/25/21 with R-sided weakness, aphasia, L gaze deviation, and R hemianopsia. MRI brain revealed large acute ischemic left MCA distribution infarct, small volume acute ischemic right ACA/MCA watershed distribution infarct, and small soft tissue contusion at the right frontotemporal scalp. Mechanical thrombectomy was performed with MCA TICI 2b recanalization; occlusion of L distal M2/MCA and partial occlusion of distal A2 branches of L ACA noted with TICI 2c recanalization. Stent x 2 to L carotid artery placed. ETT 8/29-8/30. PMH: GERD, HTN, IBS, polycistic ovarian syndrome, arthritis, morbid obesity, cancer, fibromyalgia, anxiety      SLP Plan  MBS      Recommendations for follow up therapy are one component of a multi-disciplinary discharge planning process, led by the attending physician.  Recommendations may be updated based on patient status, additional  functional criteria and insurance authorization.    Recommendations  Diet recommendations: NPO (ice chips allowed) Medication Administration: Via alternative means                Oral Care Recommendations: Oral care QID Follow Up Recommendations: Acute inpatient rehab (3hours/day) Assistance recommended at discharge: Frequent or constant Supervision/Assistance SLP Visit Diagnosis: Dysphagia, oropharyngeal phase (R13.12) Plan: MBS          Kyia Rhude I. Hardin Negus, [place], [place] Office number [phone]  Horton Marshall  10/03/2021, 9:37 AM

## 2021-10-04 ENCOUNTER — Inpatient Hospital Stay (HOSPITAL_COMMUNITY): Payer: Medicare HMO

## 2021-10-04 DIAGNOSIS — E870 Hyperosmolality and hypernatremia: Secondary | ICD-10-CM | POA: Diagnosis not present

## 2021-10-04 DIAGNOSIS — R000 Tachycardia, unspecified: Secondary | ICD-10-CM | POA: Diagnosis not present

## 2021-10-04 DIAGNOSIS — T17908A Unspecified foreign body in respiratory tract, part unspecified causing other injury, initial encounter: Secondary | ICD-10-CM | POA: Diagnosis not present

## 2021-10-04 DIAGNOSIS — I63512 Cerebral infarction due to unspecified occlusion or stenosis of left middle cerebral artery: Secondary | ICD-10-CM | POA: Diagnosis not present

## 2021-10-04 LAB — GLUCOSE, CAPILLARY
Glucose-Capillary: 148 mg/dL — ABNORMAL HIGH (ref 70–99)
Glucose-Capillary: 137 mg/dL — ABNORMAL HIGH (ref 70–99)
Glucose-Capillary: 202 mg/dL — ABNORMAL HIGH (ref 70–99)
Glucose-Capillary: 159 mg/dL — ABNORMAL HIGH (ref 70–99)
Glucose-Capillary: 173 mg/dL — ABNORMAL HIGH (ref 70–99)
Glucose-Capillary: 153 mg/dL — ABNORMAL HIGH (ref 70–99)

## 2021-10-04 MED ORDER — FREE WATER
150.0000 mL | Status: DC
Start: 2021-10-04 — End: 2021-10-06
  Administered 2021-10-04 – 2021-10-06 (×10): 150 mL

## 2021-10-04 MED ORDER — SODIUM CHLORIDE 0.9 % IV SOLN: INTRAVENOUS | Status: DC

## 2021-10-04 MED ORDER — OSMOLITE 1.5 CAL PO LIQD
1000.0000 mL | ORAL | Status: DC
  Administered 2021-10-04: 1000 mL
  Filled 2021-10-04 (×3): qty 1000

## 2021-10-04 MED ORDER — ORAL CARE MOUTH RINSE
15.0000 mL | OROMUCOSAL | Status: DC
  Administered 2021-10-04 – 2021-10-10 (×26): 15 mL via OROMUCOSAL

## 2021-10-04 NOTE — Progress Notes (Addendum)
Pt heart rate has been slowly elevating since around 1100 today, around that time patient was working with therapies. Currently pt is Sinus tach in the upper 110s, as high as 120, sustaining. Pt sitting up high-fowlers in bed, respirations low 20s and with bilateral rhonchus breath sounds, afebrile. Paged Stroke team (attending) to inform them of change. Awaiting call back.   1520: low grade fever 99.7.  1545: Spoke on phone with Dr. Erlinda Hong, tube feedings stopped. CXR and KUB ordered. NS @ 50 ordered.  1730: Orders to restart tube feeds, make pt NPO and SLP to re-eval swallowing tomorrow.

## 2021-10-04 NOTE — Plan of Care (Signed)
  Problem: Activity: Goal: Ability to tolerate increased activity will improve Outcome: Progressing   Problem: Respiratory: Goal: Ability to maintain a clear airway and adequate ventilation will improve Outcome: Progressing   Problem: Education: Goal: Individualized Educational Video(s) Outcome: Progressing   Problem: Activity: Goal: Ability to return to baseline activity level will improve Outcome: Progressing   Problem: Cardiovascular: Goal: Ability to achieve and maintain adequate cardiovascular perfusion will improve Outcome: Progressing Goal: Vascular access site(s) Level 0-1 will be maintained Outcome: Progressing   Problem: Health Behavior/Discharge Planning: Goal: Ability to safely manage health-related needs after discharge will improve Outcome: Progressing   Problem: Education: Goal: Individualized Educational Video(s) Outcome: Progressing   Problem: Health Behavior/Discharge Planning: Goal: Ability to manage health-related needs will improve Outcome: Progressing   Problem: Nutrition: Goal: Risk of aspiration will decrease Outcome: Progressing   Problem: Ischemic Stroke/TIA Tissue Perfusion: Goal: Complications of ischemic stroke/TIA will be minimized Outcome: Progressing   Problem: Education: Goal: Knowledge of General Education information will improve Description: Including pain rating scale, medication(s)/side effects and non-pharmacologic comfort measures Outcome: Progressing   Problem: Health Behavior/Discharge Planning: Goal: Ability to manage health-related needs will improve Outcome: Progressing   Problem: Clinical Measurements: Goal: Ability to maintain clinical measurements within normal limits will improve Outcome: Progressing Goal: Will remain free from infection Outcome: Progressing Goal: Diagnostic test results will improve Outcome: Progressing Goal: Respiratory complications will improve Outcome: Progressing Goal: Cardiovascular  complication will be avoided Outcome: Progressing   Problem: Activity: Goal: Risk for activity intolerance will decrease Outcome: Progressing   Problem: Nutrition: Goal: Adequate nutrition will be maintained Outcome: Progressing   Problem: Elimination: Goal: Will not experience complications related to bowel motility Outcome: Progressing Goal: Will not experience complications related to urinary retention Outcome: Progressing   Problem: Pain Managment: Goal: General experience of comfort will improve Outcome: Progressing   Problem: Safety: Goal: Ability to remain free from injury will improve Outcome: Progressing   Problem: Skin Integrity: Goal: Risk for impaired skin integrity will decrease Outcome: Progressing   Problem: Role Relationship: Goal: Method of communication will improve Outcome: Not Progressing   Problem: Education: Goal: Understanding of CV disease, CV risk reduction, and recovery process will improve Outcome: Not Progressing   Problem: Education: Goal: Knowledge of disease or condition will improve Outcome: Not Progressing Goal: Knowledge of secondary prevention will improve (SELECT ALL) Outcome: Not Progressing Goal: Knowledge of patient specific risk factors will improve (INDIVIDUALIZE FOR PATIENT) Outcome: Not Progressing   Problem: Self-Care: Goal: Ability to communicate needs accurately will improve Outcome: Not Progressing   Problem: Coping: Goal: Level of anxiety will decrease Outcome: Completed/Met   Problem: Safety: Goal: Non-violent Restraint(s) Outcome: Not Applicable

## 2021-10-04 NOTE — Progress Notes (Signed)
Inpatient Rehab Admissions Coordinator: A  Rescreened per PT request.  Pt able to transfer to chair today with max +2.  Demonstrating slow progress and beds on CIR are extremely limited.  I discussed with TOC potential referral to another area AIR if family wishes.  I would not be able to admit this patient (pending insurance approval) before sometime next week at the absolute earliest.    Shann Medal, PT, DPT Admissions Coordinator [phone] 10/04/21  11:38 AM

## 2021-10-04 NOTE — Progress Notes (Signed)
Bilateral lower extremity venous duplex has been completed. Preliminary results can be found in CV Proc through chart review.   10/04/21 10:32 AM Carlos Levering RVT

## 2021-10-04 NOTE — Progress Notes (Signed)
STROKE TEAM PROGRESS NOTE   INTERVAL HISTORY No family is at the bedside. Per RN, pt was able to eat 100% of her breakfast. However, after she worked with PT/OT at 11am, she seems somehow more agitated, tachycardia with coarse rhonchi. TF was on hold, had KUB showed correct location of cortrak but CXR concerning for aspiration. Now kept pt NPO and continue TF.   Vitals:   10/04/21 0504 10/04/21 0827 10/04/21 1130 10/04/21 1520  BP:  (!) 148/74 137/86 134/82  Pulse:  95 (!) 110 (!) 109  Resp:  '18 18 18  '$ Temp:  98.1 F (36.7 C) 98.5 F (36.9 C) 99.7 F (37.6 C)  TempSrc:  Oral Oral Axillary  SpO2:  95% 96% 94%  Weight: 109.4 kg     Height:       CBC:  Recent Labs  Lab 10/02/21 0608 10/03/21 0448  WBC 18.0* 15.6*  HGB 13.1 13.4  HCT 39.1 41.3  MCV 90.7 92.4  PLT 375 536*   Basic Metabolic Panel:  Recent Labs  Lab 09/28/21 0600 09/28/21 1643 09/29/21 0442 10/02/21 0608 10/03/21 0448  NA  --   --    < > 145 148*  K  --   --    < > 3.7 3.6  CL  --   --    < > 113* 113*  CO2  --   --    < > 22 23  GLUCOSE  --   --    < > 137* 129*  BUN  --   --    < > 23* 24*  CREATININE  --   --    < > 0.69 0.68  CALCIUM  --   --    < > 9.6 9.8  MG 2.5* 2.5*  --   --   --   PHOS 3.2 3.2  --   --   --    < > = values in this interval not displayed.   Lipid Panel:  No results for input(s): "CHOL", "TRIG", "HDL", "CHOLHDL", "VLDL", "LDLCALC" in the last 168 hours.  HgbA1c:  No results for input(s): "HGBA1C" in the last 168 hours. Urine Drug Screen:  No results for input(s): "LABOPIA", "COCAINSCRNUR", "LABBENZ", "AMPHETMU", "THCU", "LABBARB" in the last 168 hours.  Alcohol Level  No results for input(s): "ETH" in the last 168 hours.  IMAGING past 24 hours DG CHEST PORT 1 VIEW  Result Date: 10/04/2021 CLINICAL DATA:  Airway aspiration. Assess Cortrak placement, NG tube EXAM: PORTABLE CHEST 1 VIEW COMPARISON:  Radiographs September 30, 2021. FINDINGS: The heart size and mediastinal  contours are within normal limits. Patchy opacities in the left-greater-than-right lung bases may reflect atelectasis or infiltrate/aspiration. Enteric feeding catheter courses below the diaphragm with tip obscured by collimation. IMPRESSION: Patchy opacities in the left-greater-than-right lung base may reflect atelectasis or infiltrate/aspiration. Enteric feeding catheter courses below the diaphragm with tip obscured by collimation. Electronically Signed   By: Dahlia Bailiff M.D.   On: 10/04/2021 16:25   DG Abd 1 View  Result Date: 10/04/2021 CLINICAL DATA:  Cortrak placement. EXAM: ABDOMEN - 1 VIEW COMPARISON:  None Available. FINDINGS: Oral contrast material within the colon. Paucity of small bowel gas. Cortrak tip projects at the expected location of the gastric antrum/proximal duodenum within the right upper quadrant. IMPRESSION: Cortrak tip projects at the expected location of the gastric antrum/proximal duodenum within the right upper quadrant. Electronically Signed   By: Lovey Newcomer M.D.   On: 10/04/2021  16:19   VAS [place] LOWER EXTREMITY VENOUS (DVT)  Result Date: 10/03/2021  Lower Venous DVT Study Patient Name:  DOREATHA OFFER  Date of Exam:   10/03/2021 Medical Rec #: [id number]            Accession #:    [id number] Date of Birth: 05-14-1970            Patient Gender: F Patient Age:   12 years Exam Location:  [facility] Procedure:      VAS [place] LOWER EXTREMITY VENOUS (DVT) Referring Phys: Cornelius Moras Adelene Polivka --------------------------------------------------------------------------------  Indications: Leukocytosis.  Risk Factors: None identified. Comparison Study: No prior studies. Performing Technologist: Oliver Hum RVT  Examination Guidelines: A complete evaluation includes B-mode imaging, spectral Doppler, color Doppler, and power Doppler as needed of all accessible portions of each vessel. Bilateral testing is considered an integral part of a complete examination. Limited examinations for  reoccurring indications may be performed as noted. The reflux portion of the exam is performed with the patient in reverse Trendelenburg.  +---------+---------------+---------+-----------+----------+--------------+ RIGHT    CompressibilityPhasicitySpontaneityPropertiesThrombus Aging +---------+---------------+---------+-----------+----------+--------------+ CFV      Full           Yes      Yes                                 +---------+---------------+---------+-----------+----------+--------------+ SFJ      Full                                                        +---------+---------------+---------+-----------+----------+--------------+ FV Prox  Full                                                        +---------+---------------+---------+-----------+----------+--------------+ FV Mid   Full                                                        +---------+---------------+---------+-----------+----------+--------------+ FV DistalFull                                                        +---------+---------------+---------+-----------+----------+--------------+ PFV      Full                                                        +---------+---------------+---------+-----------+----------+--------------+ POP      Full           Yes      Yes                                 +---------+---------------+---------+-----------+----------+--------------+  PTV      Full                                                        +---------+---------------+---------+-----------+----------+--------------+ PERO     Full                                                        +---------+---------------+---------+-----------+----------+--------------+   +---------+---------------+---------+-----------+----------+--------------+ LEFT     CompressibilityPhasicitySpontaneityPropertiesThrombus Aging  +---------+---------------+---------+-----------+----------+--------------+ CFV      Full           Yes      Yes                                 +---------+---------------+---------+-----------+----------+--------------+ SFJ      Full                                                        +---------+---------------+---------+-----------+----------+--------------+ FV Prox  Full                                                        +---------+---------------+---------+-----------+----------+--------------+ FV Mid   Full                                                        +---------+---------------+---------+-----------+----------+--------------+ FV DistalFull                                                        +---------+---------------+---------+-----------+----------+--------------+ PFV      Full                                                        +---------+---------------+---------+-----------+----------+--------------+ POP      Full           Yes      Yes                                 +---------+---------------+---------+-----------+----------+--------------+ PTV      Full                                                        +---------+---------------+---------+-----------+----------+--------------+  PERO     Full                                                        +---------+---------------+---------+-----------+----------+--------------+     Summary: RIGHT: - There is no evidence of deep vein thrombosis in the lower extremity.  - No cystic structure found in the popliteal fossa.  LEFT: - There is no evidence of deep vein thrombosis in the lower extremity.  - No cystic structure found in the popliteal fossa.  *See table(s) above for measurements and observations. Electronically signed by Harold Barban MD on 10/03/2021 at 10:31:37 PM.    Final     PHYSICAL EXAM  Physical Exam  Constitutional: Obese caucasian female in no acute  distress Respiratory: Respirations regular and unlabored on room air  Neuro: Lethargic, initially sleeping but arousable briefly with voice. Global aphasia and nonverbal, not following commands. Left gaze preference but able to track bilaterally, more consistently blinking to visual threat on the left but not blinking to the right. Right facial droop. Tongue protrusion not cooperative. Moving LUE and LLE spontaneously and against gravity. Right hemiplegia, decreased muscle tone. Sensation, coordination and gait not tested    ASSESSMENT/PLAN Ms. JULITZA RICKLES is a 51 y.o. female with history of GERD, hypertension, IBS, polycystic ovarian syndrome, arthritis, morbid obesity presenting with right sided weakness, left gaze deviation, right hemianopsia and expressive aphasia after a fall.  CT showed dense left MCA and she was taken for emergent mechanical thrombectomy. Remained intubated post procedure. Extubated 8/30. PT/OT/ST pending  Stroke:  Left MCA and b/l ACA infarcts due to L ICA, M1 and A2/3 occlusion s/p IR with rescue proximal L ICA stent, etiology likely large vessel disease form left ICA atherosclerosis, but ddx including cardioembolic source Code Stroke CT head hyperdense left MCA consistent with thrombosis. ASPECTS 10.    CTA head & neck Emergent large vessel occlusion at the left ICA origin. Short segment of intracranial ICA reconstitution with subsequent left MCA thrombosis. Atherosclerosis. Suspect moderate narrowing at the right V1 segment. S/p IR with TICI2b on left M2 and TICI2c on left A2 MRI Large evolving acute ischemic left MCA distribution infarct. Additional small bilateral ACA and/or ACA/MCA watershed distribution infarct.  MRA  Interval revascularization of previously seen left ICA/MCA occlusion. Focal flow defect at the distal cervical left ICA Carotid Doppler  Left distal ICA stent consistent with 50-75% stenosis. 2D Echo EF 65-70% Recommend 30 day monitoring as  outpt to rule out afib LDL 137 HgbA1c 5.8 UDS positive for THC Hypercoagulable work up negative VTE prophylaxis - Lovenox No antithrombotic prior to admission, now on aspirin 81 mg daily and Brilinta (ticagrelor) 90 mg bid post ICA stenting Therapy recommendations:  CIR vs. SNF Disposition:  pending  Hypertension Home meds:  hydrochlorothiazide Stable on the high end On lisinopril 10->20 Long-term BP goal normotensive  Hyperlipidemia Home meds: Zocor 10 and zetia LDL 137, goal < 70 Now on Zetia '10mg'$ , Crestor '20mg'$  Continue statin on discharge  Dysphagia Passed swallow on dysphagia 2 and honey thick liquid -> dys 3 with honey thick->NPO given CXR concerning for aspiration Needs speech re-evaluation in am Continue TF @ 19 and free water for now May need PEG if going to SNF  Leukocytosis  WBC 14.2->15.1->15.9->15.1->18-> 15.6 UA negative CXR Patchy opacities in the  left-greater-than-right lung base may reflect atelectasis or infiltrate/aspiration Afebrile  LE venous doppler no DVT Continue monitoring  Tobacco abuse Current smoker Smoking cessation counseling will be provided  B12 deficiency B12 = 161 B12 supplement with B12 1061mg IM x 1 Continue B12 10025m po daily  Other Stroke Risk Factors THC use, cessation education will be provided Obesity, Body mass index is 36.67 kg/m., BMI >/= 30 associated with increased stroke risk, recommend weight loss, diet and exercise as appropriate   Other Active Problems Chronic lower back pain with right sided sciatica, fibromyalgia Home meds: Tramadol, zanaflex, cyclobenzaprine, celebrex IBS- on bentyl- hold for now GERD- protonix  Lethargy - added amantadine '200mg'$  bid for arousal .  Tachycardia - could be related to aspiration Hypernatremia - on free water, repeat BMP in am  Hospital day # 9  Patient developed tachycardia and agitation, and I have ordered CXR and he will be. I discussed with dietitian and RN. I spent  extensive face-to-face time with the patient, more than 50% of which was spent in counseling and coordination of care, reviewing test results, images and medication, and discussing the diagnosis, treatment plan and potential prognosis. This patient's care requiresreview of multiple databases, neurological assessment, discussion with family, other specialists and medical decision making of high complexity.    JiRosalin HawkingMD PhD Stroke Neurology 10/04/2021 5:28 PM    To contact Stroke Continuity provider, please refer to [email or web address] hours, contact General Neurology

## 2021-10-04 NOTE — Progress Notes (Signed)
Physical Therapy Treatment Patient Details Name: Erica Mcdaniel MRN: [id number] DOB: 1970-04-22 Today's Date: 10/04/2021   History of Present Illness Pt is a 51 y.o. female who presented 09/25/21 with R-sided weakness, aphasia, L gaze deviation, and R hemianopsia. Pt outside window for TNKase. MRI revealed large acute ischemic left MCA distribution infarct, small volume acute ischemic right ACA/MCA watershed distribution infarct, and small soft tissue contusion at the right frontotemporal scalp. S/p cerebral angiogram 8/29. ETT 8/29-8/30. PMH: GERD, HTN, IBS, polycistic ovarian syndrome, arthritis, morbid obesity, cancer, fibromyalgia, anxiety    PT Comments    Patient progressing toward goals.  Able to balance on EOB with close S to minguard A and not pushing today toward R.  Able to transfer to recliner with series of stands moving hips to R and sitting.  Noted R LE accepting weight once in standing.  Feel she is appropriate for acute inpatient rehab tolerating upright and putting her sock on her foot and initiating up to EOB.  She would need capable 24 hour assist at d/c.  PT will continue to follow acutely.    Recommendations for follow up therapy are one component of a multi-disciplinary discharge planning process, led by the attending physician.  Recommendations may be updated based on patient status, additional functional criteria and insurance authorization.  Follow Up Recommendations  Acute inpatient rehab (3hours/day)     Assistance Recommended at Discharge Frequent or constant Supervision/Assistance  Patient can return home with the following Two people to help with walking and/or transfers;Two people to help with bathing/dressing/bathroom;Assistance with cooking/housework;Assistance with feeding;Direct supervision/assist for medications management;Direct supervision/assist for financial management;Assist for transportation;Help with stairs or ramp for entrance   Equipment  Recommendations  Rolling walker (2 wheels);BSC/3in1;Wheelchair (measurements PT);Wheelchair cushion (measurements PT);Hospital bed;Other (comment)    Recommendations for Other Services       Precautions / Restrictions Precautions Precautions: Fall Precaution Comments: watch SpO2; L mitten; cortrak, R hemiparesis     Mobility  Bed Mobility Overal bed mobility: Needs Assistance Bed Mobility: Supine to Sit   Sidelying to sit: Mod assist, HOB elevated, +2 for physical assistance       General bed mobility comments: pulling up with L UE and bringint L LE off bed, assist for R trunk and hip to scoot to EOB    Transfers Overall transfer level: Needs assistance   Transfers: Bed to chair/wheelchair/BSC       Squat pivot transfers: Max assist, +2 safety/equipment     General transfer comment: stood and moved hips to L toward drop arm recliner then sat back on bed then chair, four movements to get fully to chair,    Ambulation/Gait                   Stairs             Wheelchair Mobility    Modified Rankin (Stroke Patients Only) Modified Rankin (Stroke Patients Only) Pre-Morbid Rankin Score: No symptoms Modified Rankin: Severe disability     Balance Overall balance assessment: Needs assistance   Sitting balance-Leahy Scale: Poor Sitting balance - Comments: on EOB with minguard to S no mirror and no LOB, close guard for safety     Standing balance-Leahy Scale: Zero Standing balance comment: +2 for standing                            Cognition Arousal/Alertness: Awake/alert Behavior During Therapy: Impulsive Overall Cognitive Status: Difficult to  assess Area of Impairment: Attention                   Current Attention Level: Sustained           General Comments: able to attend to pulling up sock on L after initiated by PT, able to pull up to sit with L UE with gesturing cues, reached out her L hand when presented with  mitten to reapply at end of session; pt with global aphasia        Exercises      General Comments General comments (skin integrity, edema, etc.): VSS; nurse tech in to change bed, aware will need lift for back to bed with lift pad in chair      Pertinent Vitals/Pain Pain Assessment Pain Assessment: Faces Faces Pain Scale: No hurt    Home Living                          Prior Function            PT Goals (current goals can now be found in the care plan section) Progress towards PT goals: Progressing toward goals    Frequency    Min 4X/week      PT Plan Current plan remains appropriate    Co-evaluation              AM-PAC PT "6 Clicks" Mobility   Outcome Measure  Help needed turning from your back to your side while in a flat bed without using bedrails?: A Lot Help needed moving from lying on your back to sitting on the side of a flat bed without using bedrails?: A Lot Help needed moving to and from a bed to a chair (including a wheelchair)?: Total Help needed standing up from a chair using your arms (e.g., wheelchair or bedside chair)?: Total Help needed to walk in hospital room?: Total Help needed climbing 3-5 steps with a railing? : Total 6 Click Score: 8    End of Session Equipment Utilized During Treatment: Gait belt Activity Tolerance: Patient tolerated treatment well Patient left: in chair;with call bell/phone within reach;with chair alarm set Nurse Communication: Need for lift equipment;Mobility status PT Visit Diagnosis: Other abnormalities of gait and mobility (R26.89);Other symptoms and signs involving the nervous system (R29.898);Hemiplegia and hemiparesis Hemiplegia - Right/Left: Right Hemiplegia - dominant/non-dominant: Dominant Hemiplegia - caused by: Cerebral infarction     Time: 9937-1696 PT Time Calculation (min) (ACUTE ONLY): 24 min  Charges:  $Therapeutic Activity: 23-37 mins                     Magda Kiel,  PT Acute Rehabilitation Services Office:[phone] 10/04/2021    Reginia Naas 10/04/2021, 11:21 AM

## 2021-10-04 NOTE — Progress Notes (Signed)
Patient is currently wearing mitts due to mental status.  No CPAP on patient due to this restriction.  No distress noted at this time.

## 2021-10-04 NOTE — Progress Notes (Signed)
Nutrition Follow-up  DOCUMENTATION CODES:   Not applicable  INTERVENTION:  Continue tube feeding via NG/Cortrak tube: Osmolite 1.5 at 50 ml/h (1200 ml per day) Add free water flushes 121m q4h   Provides 1800 kcal, 90 gm protein, 1812 ml total free water daily  NUTRITION DIAGNOSIS:   Inadequate oral intake related to inability to eat as evidenced by NPO status.  Improving-diet advanced, TF infusing  GOAL:   Patient will meet greater than or equal to 90% of their needs  Goal met via tube feeding and PO intake  MONITOR:   TF tolerance  REASON FOR ASSESSMENT:   Consult Enteral/tube feeding initiation and management  ASSESSMENT:   Pt with PMH of HTN, tobacco abuse (2PPD), + THC, GERD, colon cancer, IBS, PCOS, skin ca, fibromyalgia, depression and anxiety admitted with dense L MCA stroke s/p thrombectomy.  9/6 s/p MBS- recommend dysphagia 2, honey thick liquids 9/7 diet advanced to dysphagia 3, honey thick liquids  Pt sitting up in chair at time of visit. Noted to be aphasic and unable to provide responses at this time. Spoke with RN who reports pt has been tolerating tube feeding well and is eating well.   Meal completions: 9/6 100%-dinner 9/7 98%-breakfast, 85%- lunch  Spoke with MD. Concern for aspiration while working with PT. KUB and CXR ordered. Cortrak confirmed still in place. Was initially planning to adjust tube feeding to nocturnal however MD to make pt NPO today until re-evaluation by ST d/t signs of aspiration on CXR. Will continue with current regimen. MD agree to addition of free water flushes.   Admit weight: 124 kg Current weight: 109.4 kg  Medications: colace, MVI, protonix, miralax, Vitamin B12  Labs: sodium 148, BUN 24, CBG's 110-149 x24 hours  Diet Order:   Diet Order             DIET DYS 3 Room service appropriate? No; Fluid consistency: Honey Thick  Diet effective now                   EDUCATION NEEDS:   No education needs have  been identified at this time  Skin:  Skin Assessment: Reviewed RN Assessment  Last BM:  unknown  Height:   Ht Readings from Last 1 Encounters:  09/25/21 _0  (1.727 m)    Weight:   Wt Readings from Last 1 Encounters:  10/04/21 109.4 kg   BMI:  Body mass index is 36.67 kg/m.  Estimated Nutritional Needs:   Kcal:  1800-2000  Protein:  90-105 grams  Fluid:  >1.8 L/day  AClayborne Dana RDN, LDN Clinical Nutrition

## 2021-10-04 NOTE — Progress Notes (Signed)
Speech Language Pathology Treatment: Dysphagia  Patient Details Name: Erica Mcdaniel MRN: [id number] DOB: 06-06-1970 Today's Date: 10/04/2021 Time: 2836-6294 SLP Time Calculation (min) (ACUTE ONLY): 23 min  Assessment / Plan / Recommendation Clinical Impression  Pt was seen for dysphagia treatment. She was alert and cooperative during the session. On SLP's arrival, pt was zealously eating breakfast which included scrambled eggs, sausage, oatmeal and honey thick liquids. No s/s of aspiration were noted with solids or liquids. Pt's bolus sizes continue to be large with liquids if the straw/cup is not removed. Throat clearing was intermittently noted with trials of nectar thick liquids via cup even when boluses are smaller. Mastication was mild to moderately prolonged with trials of regular texture solids, but WFL for dysphagia 2 and 3. Mild oral residue was noted with regular textures, but oral clearance was otherwise WNL. A dysphagia 3 diet with honey thick liquids is recommended at this time with continued observance of swallowing precautions. SLP will continue to follow pt.     HPI HPI: Pt is a 51 y.o. female who presented 09/25/21 with R-sided weakness, aphasia, L gaze deviation, and R hemianopsia. MRI brain revealed large acute ischemic left MCA distribution infarct, small volume acute ischemic right ACA/MCA watershed distribution infarct, and small soft tissue contusion at the right frontotemporal scalp. Mechanical thrombectomy was performed with MCA TICI 2b recanalization; occlusion of L distal M2/MCA and partial occlusion of distal A2 branches of L ACA noted with TICI 2c recanalization. Stent x 2 to L carotid artery placed. ETT 8/29-8/30. PMH: GERD, HTN, IBS, polycistic ovarian syndrome, arthritis, morbid obesity, cancer, fibromyalgia, anxiety      SLP Plan  Continue with current plan of care      Recommendations for follow up therapy are one component of a multi-disciplinary discharge  planning process, led by the attending physician.  Recommendations may be updated based on patient status, additional functional criteria and insurance authorization.    Recommendations  Diet recommendations: Dysphagia 3 (mechanical soft);Honey-thick liquid Liquids provided via: Cup;Straw Medication Administration: Crushed with puree Supervision: Staff to assist with self feeding Compensations: Slow rate;Small sips/bites;Follow solids with liquid Postural Changes and/or Swallow Maneuvers: Seated upright 90 degrees                Oral Care Recommendations: Oral care BID Follow Up Recommendations: Acute inpatient rehab (3hours/day) Assistance recommended at discharge: Frequent or constant Supervision/Assistance SLP Visit Diagnosis: Dysphagia, oropharyngeal phase (R13.12) Plan: Continue with current plan of care          Laquanta Hummel I. Hardin Negus, [place], [place] Office number [phone]  Horton Marshall  10/04/2021, 9:55 AM

## 2021-10-04 NOTE — [place] FL2 (Signed)
[place] MEDICAID FL2 LEVEL OF CARE SCREENING TOOL     IDENTIFICATION  Patient Name: Erica Mcdaniel Birthdate: Feb 28, 1970 Sex: female Admission Date (Current Location): 09/25/2021  [facility] and [place] Number:  [employer] and Address:  [facility], [place] [address], [place], [place] [address]      Provider Number: [id number]  Attending Physician Name and Address:  Stroke, Md, MD  Relative Name and Phone Number:       Current Level of Care: Hospital Recommended Level of Care: [place] Prior Approval Number:    Date Approved/Denied:   PASRR Number: [id number]  Discharge Plan: SNF    Current Diagnoses: Patient Active Problem List   Diagnosis Date Noted   Acute ischemic left MCA stroke ([place]) 09/25/2021   S/P TKR (total knee replacement) 08/30/2020   Pain in left knee 07/28/2018   Unilateral primary osteoarthritis, left knee 07/28/2018   Lateral epicondylitis of right elbow 12/08/2012   Ventral hernia 11/02/2010   Abnormal intestinal absorption  11/02/2010   Abdominal pain 11/02/2010   Fatigue 11/02/2010   Personal history of colonic polyps 11/02/2010   GERD (gastroesophageal reflux disease) 11/02/2010   GERD 08/06/2007   IBS 08/06/2007   ABDOMINAL PAIN-RUQ 08/06/2007   OBESITY 08/05/2007   ANXIETY 08/05/2007   SOMATIZATION DISORDER 08/05/2007   OVARIAN CYST 08/05/2007   PMS 08/05/2007   DYSFUNCTIONAL UTERINE BLEEDING 08/05/2007   FEMALE INFERTILITY 08/05/2007   DEGENERATIVE JOINT DISEASE 08/05/2007   OSTEOARTHRITIS, KNEES, BILATERAL 08/05/2007   KNEE PAIN, BILATERAL 08/05/2007   DEGENERATIVE DISC DISEASE 08/05/2007   LOW BACK PAIN, CHRONIC 08/05/2007   FIBROMYALGIA 08/05/2007   INSOMNIA 08/05/2007   PAP SMEAR, ABNORMAL 08/05/2007    Orientation RESPIRATION BLADDER Height & Weight      (unable to assess)  Normal Incontinent Weight: 241 lb 2.9 oz (109.4 kg) Height:  '5\' 8"'$  (172.7 cm)  BEHAVIORAL  SYMPTOMS/MOOD NEUROLOGICAL BOWEL NUTRITION STATUS      Incontinent Diet (see DC summary)  AMBULATORY STATUS COMMUNICATION OF NEEDS Skin   Extensive Assist Non-Verbally Normal                       Personal Care Assistance Level of Assistance  Bathing, Feeding, Dressing Bathing Assistance: Maximum assistance Feeding assistance: Maximum assistance Dressing Assistance: Maximum assistance     Functional Limitations Info  Speech     Speech Info: Impaired (nonverbal)    SPECIAL CARE FACTORS FREQUENCY  PT (By licensed PT), OT (By licensed OT), Speech therapy     PT Frequency: 5x/wk OT Frequency: 5x/wk     Speech Therapy Frequency: 5x/wk      Contractures Contractures Info: Not present    Additional Factors Info  Code Status, Allergies, Psychotropic Code Status Info: Full Allergies Info: Clarithromycin Psychotropic Info: Seroquel '25mg'$  2x/day         Current Medications (10/04/2021):  This is the current hospital active medication list Current Facility-Administered Medications  Medication Dose Route Frequency Provider Last Rate Last Admin   acetaminophen (TYLENOL) tablet 650 mg  650 mg Oral Q4H PRN Donnetta Simpers, MD       Or   acetaminophen (TYLENOL) 160 MG/5ML solution 650 mg  650 mg Per Tube Q4H PRN Donnetta Simpers, MD       Or   acetaminophen (TYLENOL) suppository 650 mg  650 mg Rectal Q4H PRN Donnetta Simpers, MD       albuterol (PROVENTIL) (2.5 MG/3ML) 0.083% nebulizer solution  2.5 mg  2.5 mg Nebulization Q4H PRN [place] Crane M, PA-C       amantadine (SYMMETREL) 50 MG/5ML solution 200 mg  200 mg Per Tube BID Rosalin Hawking, MD   200 mg at 10/04/21 5320   aspirin chewable tablet 81 mg  81 mg Per Tube Daily Janine Ores, NP   81 mg at 10/04/21 2334   Chlorhexidine Gluconate Cloth 2 % PADS 6 each  6 each Topical Daily Garvin Fila, MD   6 each at 10/04/21 0923   docusate (COLACE) 50 MG/5ML liquid 100 mg  100 mg Per Tube BID [place] Crane M, PA-C    100 mg at 10/04/21 0924   enoxaparin (LOVENOX) injection 40 mg  40 mg Subcutaneous Q24H Donnetta Simpers, MD   40 mg at 10/03/21 1426   feeding supplement (OSMOLITE 1.5 CAL) liquid 1,000 mL  1,000 mL Per Tube Continuous Garvin Fila, MD 50 mL/hr at 10/04/21 0938 1,000 mL at 10/04/21 0938   hydrALAZINE (APRESOLINE) injection 20 mg  20 mg Intravenous Q6H PRN Janine Ores, NP   20 mg at 09/25/21 1638   labetalol (NORMODYNE) injection 10 mg  10 mg Intravenous Q10 min PRN Janine Ores, NP   10 mg at 10/03/21 2016   lisinopril (ZESTRIL) tablet 20 mg  20 mg Per Tube Daily Rosalin Hawking, MD   20 mg at 10/04/21 3568   multivitamin with minerals tablet 1 tablet  1 tablet Per Tube Daily Donnetta Simpers, MD   1 tablet at 10/04/21 6168   Oral care mouth rinse  15 mL Mouth Rinse PRN Donnetta Simpers, MD       Oral care mouth rinse  15 mL Mouth Rinse 4 times per day Donnetta Simpers, MD   15 mL at 10/04/21 0924   pantoprazole (PROTONIX) 2 mg/mL oral suspension 40 mg  40 mg Per Tube Daily [place] Crane M, PA-C   40 mg at 10/04/21 3729   polyethylene glycol (MIRALAX / GLYCOLAX) packet 17 g  17 g Per Tube Daily [place] Crane M, PA-C   17 g at 10/04/21 0211   QUEtiapine (SEROQUEL) tablet 25 mg  25 mg Per Tube BID Rolanda Lundborg, MD   25 mg at 10/04/21 1552   rosuvastatin (CRESTOR) tablet 20 mg  20 mg Per Tube Daily Kipp Brood, MD   20 mg at 10/04/21 0802   ticagrelor (BRILINTA) tablet 90 mg  90 mg Per Tube BID Janine Ores, NP   90 mg at 10/04/21 2336   vitamin B-12 (CYANOCOBALAMIN) tablet 500 mcg  500 mcg Per Tube Daily Rosalin Hawking, MD   500 mcg at 10/04/21 1224     Discharge Medications: Please see discharge summary for a list of discharge medications.  Relevant Imaging Results:  Relevant Lab Results:   Additional Information SS#: [id number]  Geralynn Ochs, LCSW

## 2021-10-05 ENCOUNTER — Inpatient Hospital Stay (HOSPITAL_COMMUNITY): Payer: Medicare HMO

## 2021-10-05 DIAGNOSIS — R1312 Dysphagia, oropharyngeal phase: Secondary | ICD-10-CM | POA: Diagnosis not present

## 2021-10-05 DIAGNOSIS — I63512 Cerebral infarction due to unspecified occlusion or stenosis of left middle cerebral artery: Secondary | ICD-10-CM | POA: Diagnosis not present

## 2021-10-05 DIAGNOSIS — D72829 Elevated white blood cell count, unspecified: Secondary | ICD-10-CM | POA: Diagnosis not present

## 2021-10-05 LAB — GLUCOSE, CAPILLARY
Glucose-Capillary: 146 mg/dL — ABNORMAL HIGH (ref 70–99)
Glucose-Capillary: 105 mg/dL — ABNORMAL HIGH (ref 70–99)
Glucose-Capillary: 150 mg/dL — ABNORMAL HIGH (ref 70–99)
Glucose-Capillary: 104 mg/dL — ABNORMAL HIGH (ref 70–99)
Glucose-Capillary: 128 mg/dL — ABNORMAL HIGH (ref 70–99)

## 2021-10-05 LAB — BASIC METABOLIC PANEL
Anion gap: 9 (ref 5–15)
BUN: 22 mg/dL — ABNORMAL HIGH (ref 6–20)
CO2: 23 mmol/L (ref 22–32)
Calcium: 9.5 mg/dL (ref 8.9–10.3)
Chloride: 115 mmol/L — ABNORMAL HIGH (ref 98–111)
Creatinine, Ser: 0.75 mg/dL (ref 0.44–1.00)
GFR, Estimated: >60 mL/min (ref >60)
Glucose, Bld: 155 mg/dL — ABNORMAL HIGH (ref 70–99)
Potassium: 3.6 mmol/L (ref 3.5–5.1)
Sodium: 147 mmol/L — ABNORMAL HIGH (ref 135–145)

## 2021-10-05 LAB — CBC
HCT: 42.2 % (ref 36.0–46.0)
Hemoglobin: 13.9 g/dL (ref 12.0–15.0)
MCH: 29.9 pg (ref 26.0–34.0)
MCHC: 32.9 g/dL (ref 30.0–36.0)
MCV: 90.8 fL (ref 80.0–100.0)
Platelets: 440 10*3/uL — ABNORMAL HIGH (ref 150–400)
RBC: 4.65 MIL/uL (ref 3.87–5.11)
RDW: 14.2 % (ref 11.5–15.5)
WBC: 17.8 10*3/uL — ABNORMAL HIGH (ref 4.0–10.5)
nRBC: 0 % (ref 0.0–0.2)

## 2021-10-05 MED ORDER — OSMOLITE 1.5 CAL PO LIQD
1000.0000 mL | ORAL | Status: DC
Start: 2021-10-05 — End: 2021-10-07
  Administered 2021-10-05 – 2021-10-06 (×2): 1000 mL

## 2021-10-05 NOTE — Progress Notes (Addendum)
STROKE TEAM PROGRESS NOTE   INTERVAL HISTORY Patient is sitting up in bed with coetrak in place. Patient aphasia, not following commands. Right side is flaccid. NO family at the bedside.  Leukocytosis up to 17.8, afebrile. CXR yesterday with Patchy opacities in the left-greater-than-right lung base may reflect atelectasis or infiltrate/aspiration. Will continue to monitor. No new neurological events overnight. Neurological exam unchanged.   Vitals:   10/04/21 1957 10/04/21 2328 10/05/21 0426 10/05/21 0751  BP: (!) 143/79 133/86 (!) 156/77 (!) 147/78  Pulse: 99 85 75 77  Resp: 20 (!) 22  16  Temp: 98.2 F (36.8 C) 98.2 F (36.8 C) 97.8 F (36.6 C) 97.8 F (36.6 C)  TempSrc: Oral Axillary Oral Oral  SpO2: 94% 97% 99% 96%  Weight:      Height:       CBC:  Recent Labs  Lab 10/03/21 0448 10/05/21 0353  WBC 15.6* 17.8*  HGB 13.4 13.9  HCT 41.3 42.2  MCV 92.4 90.8  PLT 421* 440*    Basic Metabolic Panel:  Recent Labs  Lab 09/28/21 1643 09/29/21 0442 10/03/21 0448 10/05/21 0353  NA  --    < > 148* 147*  K  --    < > 3.6 3.6  CL  --    < > 113* 115*  CO2  --    < > 23 23  GLUCOSE  --    < > 129* 155*  BUN  --    < > 24* 22*  CREATININE  --    < > 0.68 0.75  CALCIUM  --    < > 9.8 9.5  MG 2.5*  --   --   --   PHOS 3.2  --   --   --    < > = values in this interval not displayed.    Lipid Panel:  No results for input(s): "CHOL", "TRIG", "HDL", "CHOLHDL", "VLDL", "LDLCALC" in the last 168 hours.  HgbA1c:  No results for input(s): "HGBA1C" in the last 168 hours. Urine Drug Screen:  No results for input(s): "LABOPIA", "COCAINSCRNUR", "LABBENZ", "AMPHETMU", "THCU", "LABBARB" in the last 168 hours.  Alcohol Level  No results for input(s): "ETH" in the last 168 hours.  IMAGING past 24 hours DG CHEST PORT 1 VIEW  Result Date: 10/04/2021 CLINICAL DATA:  Airway aspiration. Assess Cortrak placement, NG tube EXAM: PORTABLE CHEST 1 VIEW COMPARISON:  Radiographs September 30, 2021. FINDINGS: The heart size and mediastinal contours are within normal limits. Patchy opacities in the left-greater-than-right lung bases may reflect atelectasis or infiltrate/aspiration. Enteric feeding catheter courses below the diaphragm with tip obscured by collimation. IMPRESSION: Patchy opacities in the left-greater-than-right lung base may reflect atelectasis or infiltrate/aspiration. Enteric feeding catheter courses below the diaphragm with tip obscured by collimation. Electronically Signed   By: Dahlia Bailiff M.D.   On: 10/04/2021 16:25   DG Abd 1 View  Result Date: 10/04/2021 CLINICAL DATA:  Cortrak placement. EXAM: ABDOMEN - 1 VIEW COMPARISON:  None Available. FINDINGS: Oral contrast material within the colon. Paucity of small bowel gas. Cortrak tip projects at the expected location of the gastric antrum/proximal duodenum within the right upper quadrant. IMPRESSION: Cortrak tip projects at the expected location of the gastric antrum/proximal duodenum within the right upper quadrant. Electronically Signed   By: Lovey Newcomer M.D.   On: 10/04/2021 16:19   VAS [place] LOWER EXTREMITY VENOUS (DVT)  Result Date: 10/03/2021  Lower Venous DVT Study Patient Name:  FAWN DESROCHER  [facility]  Date of Exam:   10/03/2021 Medical Rec #: [id number]            Accession #:    [id number] Date of Birth: 26-Aug-1970            Patient Gender: F Patient Age:   1 years Exam Location:  [facility] Procedure:      VAS [place] LOWER EXTREMITY VENOUS (DVT) Referring Phys: Cornelius Moras Zabella Wease --------------------------------------------------------------------------------  Indications: Leukocytosis.  Risk Factors: None identified. Comparison Study: No prior studies. Performing Technologist: Oliver Hum RVT  Examination Guidelines: A complete evaluation includes B-mode imaging, spectral Doppler, color Doppler, and power Doppler as needed of all accessible portions of each vessel. Bilateral testing is considered an integral part of a  complete examination. Limited examinations for reoccurring indications may be performed as noted. The reflux portion of the exam is performed with the patient in reverse Trendelenburg.  +---------+---------------+---------+-----------+----------+--------------+ RIGHT    CompressibilityPhasicitySpontaneityPropertiesThrombus Aging +---------+---------------+---------+-----------+----------+--------------+ CFV      Full           Yes      Yes                                 +---------+---------------+---------+-----------+----------+--------------+ SFJ      Full                                                        +---------+---------------+---------+-----------+----------+--------------+ FV Prox  Full                                                        +---------+---------------+---------+-----------+----------+--------------+ FV Mid   Full                                                        +---------+---------------+---------+-----------+----------+--------------+ FV DistalFull                                                        +---------+---------------+---------+-----------+----------+--------------+ PFV      Full                                                        +---------+---------------+---------+-----------+----------+--------------+ POP      Full           Yes      Yes                                 +---------+---------------+---------+-----------+----------+--------------+ PTV      Full                                                        +---------+---------------+---------+-----------+----------+--------------+  PERO     Full                                                        +---------+---------------+---------+-----------+----------+--------------+   +---------+---------------+---------+-----------+----------+--------------+ LEFT     CompressibilityPhasicitySpontaneityPropertiesThrombus Aging  +---------+---------------+---------+-----------+----------+--------------+ CFV      Full           Yes      Yes                                 +---------+---------------+---------+-----------+----------+--------------+ SFJ      Full                                                        +---------+---------------+---------+-----------+----------+--------------+ FV Prox  Full                                                        +---------+---------------+---------+-----------+----------+--------------+ FV Mid   Full                                                        +---------+---------------+---------+-----------+----------+--------------+ FV DistalFull                                                        +---------+---------------+---------+-----------+----------+--------------+ PFV      Full                                                        +---------+---------------+---------+-----------+----------+--------------+ POP      Full           Yes      Yes                                 +---------+---------------+---------+-----------+----------+--------------+ PTV      Full                                                        +---------+---------------+---------+-----------+----------+--------------+ PERO     Full                                                        +---------+---------------+---------+-----------+----------+--------------+  Summary: RIGHT: - There is no evidence of deep vein thrombosis in the lower extremity.  - No cystic structure found in the popliteal fossa.  LEFT: - There is no evidence of deep vein thrombosis in the lower extremity.  - No cystic structure found in the popliteal fossa.  *See table(s) above for measurements and observations. Electronically signed by Harold Barban MD on 10/03/2021 at 10:31:37 PM.    Final     PHYSICAL EXAM  Physical Exam  Constitutional: Obese caucasian female in no acute  distress Respiratory: Respirations regular and unlabored on room air  Neuro: Awake and alert. Global aphasia and nonverbal, not following commands. Left gaze preference but able to track bilaterally, more consistently blinking to visual threat on the left but not blinking to the right. Right facial droop. Tongue protrusion not cooperative. Moving LUE and LLE spontaneously and against gravity. Right hemiplegia, decreased muscle tone. Sensation, coordination and gait not tested    ASSESSMENT/PLAN Ms. BRANTLEE HINDE is a 51 y.o. female with history of GERD, hypertension, IBS, polycystic ovarian syndrome, arthritis, morbid obesity presenting with right sided weakness, left gaze deviation, right hemianopsia and expressive aphasia after a fall.  CT showed dense left MCA and she was taken for emergent mechanical thrombectomy. Remained intubated post procedure. Extubated 8/30. PT/OT/ST pending  Stroke:  Left MCA and b/l ACA infarcts due to L ICA, M1 and A2/3 occlusion s/p IR with rescue proximal L ICA stent, etiology likely large vessel disease form left ICA atherosclerosis, but ddx including cardioembolic source Code Stroke CT head hyperdense left MCA consistent with thrombosis. ASPECTS 10.    CTA head & neck Emergent large vessel occlusion at the left ICA origin. Short segment of intracranial ICA reconstitution with subsequent left MCA thrombosis. Atherosclerosis. Suspect moderate narrowing at the right V1 segment. S/p IR with TICI2b on left M2 and TICI2c on left A2 MRI Large evolving acute ischemic left MCA distribution infarct. Additional small bilateral ACA and/or ACA/MCA watershed distribution infarct.  MRA  Interval revascularization of previously seen left ICA/MCA occlusion. Focal flow defect at the distal cervical left ICA Carotid Doppler  Left distal ICA stent consistent with 50-75% stenosis. 2D Echo EF 65-70% Recommend 30 day monitoring as outpt to rule out afib LDL 137 HgbA1c 5.8 UDS  positive for THC Hypercoagulable work up negative VTE prophylaxis - Lovenox No antithrombotic prior to admission, now on aspirin 81 mg daily and Brilinta (ticagrelor) 90 mg bid post ICA stenting Therapy recommendations:  CIR vs. SNF Disposition:  pending  Hypertension Home meds:  hydrochlorothiazide Stable on the high end On lisinopril 10->20 Long-term BP goal normotensive  Hyperlipidemia Home meds: Zocor 10 and zetia LDL 137, goal < 70 Now on Zetia '10mg'$ , Crestor '20mg'$  Continue statin on discharge  Dysphagia Passed swallow on dysphagia 2 and honey thick liquid -> dys 3 with honey thick->NPO given CXR concerning for aspiration -> dys 3 and honey thick Change TF to nocturnal feeding Allow po intake during the day Calorie count Continue free water for now  Leukocytosis  WBC 14.2->15.1->15.9->15.1->18-> 15.6->17.8 UA negative CXR Patchy opacities in the left-greater-than-right lung base may reflect atelectasis or infiltrate/aspiration Afebrile  LE venous doppler no DVT Continue monitoring  Tobacco abuse Current smoker Smoking cessation counseling will be provided  B12 deficiency B12 = 161 B12 supplement with B12 1017mg IM x 1 Continue B12 1001m po daily  Other Stroke Risk Factors THC use, cessation education will be provided Obesity, Body mass index is 36.67 kg/m., BMI >/=  30 associated with increased stroke risk, recommend weight loss, diet and exercise as appropriate   Other Active Problems Chronic lower back pain with right sided sciatica, fibromyalgia Home meds: Tramadol, zanaflex, cyclobenzaprine, celebrex IBS- on bentyl- hold for now GERD- protonix  Lethargy - added amantadine '200mg'$  bid for arousal .  Hypernatremia - on free water, repeat BMP in am  Hospital day # Mc[place] DNP, ACNPC-AG   ATTENDING NOTE: I reviewed above note and agree with the assessment and plan. Pt was seen and examined.   No family at bedside.  Patient reclining in bed,  not in distress, tachycardia has resolved, breathing normally.  Still has global aphasia and right hemiplegia.  Barium study recommend dysphagia 3 and honey thick liquid.  We will change tube feeding to nocturnal tube feeding and allow her to eat during the day.  We will do calorie count.  Still have mild hypernatremia, continue free water.  Leukocytosis persist could be related to mild aspiration, will continue monitoring.  Continue DAPT and statin.  PT/OT recommend CIR.  For detailed assessment and plan, please refer to above/below as I have made changes wherever appropriate.   Rosalin Hawking, MD PhD Stroke Neurology 10/05/2021 7:51 PM    To contact Stroke Continuity provider, please refer to [email or web address] After hours, contact General Neurology

## 2021-10-05 NOTE — Plan of Care (Signed)
  Problem: Activity: Goal: Ability to tolerate increased activity will improve Outcome: Progressing   Problem: Respiratory: Goal: Ability to maintain a clear airway and adequate ventilation will improve Outcome: Progressing   Problem: Education: Goal: Understanding of CV disease, CV risk reduction, and recovery process will improve Outcome: Progressing Goal: Individualized Educational Video(s) Outcome: Progressing   Problem: Activity: Goal: Ability to return to baseline activity level will improve Outcome: Progressing   Problem: Cardiovascular: Goal: Ability to achieve and maintain adequate cardiovascular perfusion will improve Outcome: Progressing Goal: Vascular access site(s) Level 0-1 will be maintained Outcome: Progressing   Problem: Education: Goal: Individualized Educational Video(s) Outcome: Progressing   Problem: Health Behavior/Discharge Planning: Goal: Ability to manage health-related needs will improve Outcome: Progressing   Problem: Nutrition: Goal: Risk of aspiration will decrease Outcome: Progressing   Problem: Ischemic Stroke/TIA Tissue Perfusion: Goal: Complications of ischemic stroke/TIA will be minimized Outcome: Progressing   Problem: Clinical Measurements: Goal: Ability to maintain clinical measurements within normal limits will improve Outcome: Progressing Goal: Will remain free from infection Outcome: Progressing Goal: Diagnostic test results will improve Outcome: Progressing Goal: Respiratory complications will improve Outcome: Progressing Goal: Cardiovascular complication will be avoided Outcome: Progressing   Problem: Activity: Goal: Risk for activity intolerance will decrease Outcome: Progressing   Problem: Nutrition: Goal: Adequate nutrition will be maintained Outcome: Progressing   Problem: Elimination: Goal: Will not experience complications related to bowel motility Outcome: Progressing Goal: Will not experience  complications related to urinary retention Outcome: Progressing   Problem: Pain Managment: Goal: General experience of comfort will improve Outcome: Progressing   Problem: Safety: Goal: Ability to remain free from injury will improve Outcome: Progressing   Problem: Skin Integrity: Goal: Risk for impaired skin integrity will decrease Outcome: Progressing   Problem: Role Relationship: Goal: Method of communication will improve Outcome: Not Progressing   Problem: Health Behavior/Discharge Planning: Goal: Ability to safely manage health-related needs after discharge will improve Outcome: Not Progressing   Problem: Education: Goal: Knowledge of disease or condition will improve Outcome: Not Progressing Goal: Knowledge of secondary prevention will improve (SELECT ALL) Outcome: Not Progressing Goal: Knowledge of patient specific risk factors will improve (INDIVIDUALIZE FOR PATIENT) Outcome: Not Progressing   Problem: Self-Care: Goal: Ability to communicate needs accurately will improve Outcome: Not Progressing   Problem: Education: Goal: Knowledge of General Education information will improve Description: Including pain rating scale, medication(s)/side effects and non-pharmacologic comfort measures Outcome: Not Progressing   Problem: Health Behavior/Discharge Planning: Goal: Ability to manage health-related needs will improve Outcome: Not Progressing

## 2021-10-05 NOTE — Progress Notes (Signed)
Tried to speak to Pt about wearing cpap tonight but Pt is non verbal, cpap is not recommended at this time.

## 2021-10-05 NOTE — Progress Notes (Signed)
Physical Therapy Treatment Patient Details Name: Erica Mcdaniel MRN: [id number] DOB: 22-Mar-1970 Today's Date: 10/05/2021   History of Present Illness Pt is a 51 y.o. female who presented 09/25/21 with R-sided weakness, aphasia, L gaze deviation, and R hemianopsia. Pt outside window for TNKase. MRI revealed large acute ischemic left MCA distribution infarct, small volume acute ischemic right ACA/MCA watershed distribution infarct, and small soft tissue contusion at the right frontotemporal scalp. S/p cerebral angiogram 8/29. ETT 8/29-8/30. PMH: GERD, HTN, IBS, polycistic ovarian syndrome, arthritis, morbid obesity, cancer, fibromyalgia, anxiety    PT Comments    Patient able to stand erect with Stedy and bilat UE support keeping R hand supported on bar on Stedy.  She was able to activate the R LE in standing, but difficulty activating into pressure in sitting.  She was positioned for comfort in chair.  Feel she remains appropriate for acute inpatient rehab at d/c.   Recommendations for follow up therapy are one component of a multi-disciplinary discharge planning process, led by the attending physician.  Recommendations may be updated based on patient status, additional functional criteria and insurance authorization.  Follow Up Recommendations  Acute inpatient rehab (3hours/day)     Assistance Recommended at Discharge Frequent or constant Supervision/Assistance  Patient can return home with the following Two people to help with walking and/or transfers;Two people to help with bathing/dressing/bathroom;Assistance with cooking/housework;Assistance with feeding;Direct supervision/assist for medications management;Direct supervision/assist for financial management;Assist for transportation;Help with stairs or ramp for entrance   Equipment Recommendations  Rolling walker (2 wheels);BSC/3in1;Wheelchair (measurements PT);Wheelchair cushion (measurements PT);Hospital bed;Other (comment)     Recommendations for Other Services       Precautions / Restrictions Precautions Precautions: Fall Precaution Comments: watch SpO2; L mitten; cortrak, R hemiparesis     Mobility  Bed Mobility Overal bed mobility: Needs Assistance Bed Mobility: Supine to Sit   Sidelying to sit: Mod assist, HOB elevated, +2 for physical assistance       General bed mobility comments: pulling up with L UE and bringing L LE off bed, assist for R trunk and hip to scoot to EOB    Transfers Overall transfer level: Needs assistance   Transfers: Sit to/from Stand, Bed to chair/wheelchair/BSC Sit to Stand: Mod assist           General transfer comment: sit to stand on Stedy mod A of 2 from elevated surface and A to place R hand on bar, cues and assist to sit on flaps for transfer to chair then cues, time and A of 2 for sit to stand on STedy to remove flaps and lowering help to sit in recliner Transfer via Lift Equipment: Stedy  Ambulation/Gait                   Stairs             Wheelchair Mobility    Modified Rankin (Stroke Patients Only) Modified Rankin (Stroke Patients Only) Pre-Morbid Rankin Score: No symptoms Modified Rankin: Severe disability     Balance Overall balance assessment: Needs assistance Sitting-balance support: Feet supported Sitting balance-Leahy Scale: Fair Sitting balance - Comments: no LOB up on EOB   Standing balance support: Bilateral upper extremity supported Standing balance-Leahy Scale: Poor Standing balance comment: UE support on STedy for standing                            Cognition Arousal/Alertness: Awake/alert Behavior During Therapy: Impulsive Overall Cognitive Status:  Difficult to assess Area of Impairment: Attention                   Current Attention Level: Sustained           General Comments: initiating with gestures and increased time, repeated demonstration cues for sit to stand, stand to sit         Exercises Other Exercises Other Exercises: R LE PROM/AAROM pressure into leg to facilitate contraction x 6 reps with minimal activation    General Comments General comments (skin integrity, edema, etc.): Spoke with NT to use Stedy for back to bed      Pertinent Vitals/Pain Pain Assessment Pain Assessment: Faces Faces Pain Scale: No hurt    Home Living                          Prior Function            PT Goals (current goals can now be found in the care plan section) Progress towards PT goals: Progressing toward goals    Frequency    Min 4X/week      PT Plan Current plan remains appropriate    Co-evaluation              AM-PAC PT "6 Clicks" Mobility   Outcome Measure  Help needed turning from your back to your side while in a flat bed without using bedrails?: A Lot Help needed moving from lying on your back to sitting on the side of a flat bed without using bedrails?: Total Help needed moving to and from a bed to a chair (including a wheelchair)?: Total Help needed standing up from a chair using your arms (e.g., wheelchair or bedside chair)?: Total Help needed to walk in hospital room?: Total Help needed climbing 3-5 steps with a railing? : Total 6 Click Score: 7    End of Session Equipment Utilized During Treatment: Gait belt Activity Tolerance: Patient tolerated treatment well Patient left: in chair;with call bell/phone within reach;with chair alarm set Nurse Communication: Need for lift equipment PT Visit Diagnosis: Other abnormalities of gait and mobility (R26.89);Other symptoms and signs involving the nervous system (R29.898);Hemiplegia and hemiparesis Hemiplegia - Right/Left: Right Hemiplegia - dominant/non-dominant: Dominant Hemiplegia - caused by: Cerebral infarction     Time: 2130-8657 PT Time Calculation (min) (ACUTE ONLY): 25 min  Charges:  $Therapeutic Activity: 23-37 mins                     Magda Kiel, PT Acute  Rehabilitation Services Office:[phone] 10/05/2021    Reginia Naas 10/05/2021, 2:23 PM

## 2021-10-05 NOTE — Progress Notes (Signed)
Modified Barium Swallow Progress Note  Patient Details  Name: Erica Mcdaniel MRN: [id number] Date of Birth: 06-Mar-1970  Today's Date: 10/05/2021  Modified Barium Swallow completed.  Full report located under Chart Review in the Imaging Section.  Brief recommendations include the following:  Clinical Impression  With consideration of the pt's shoulders empeding the view of the larynx during the MBS on 9/6, today's stidy was conducted in the bariatric radiology suite. Despite pt's adequate space and repositioning from the SLP and radiology tech, pt's shoulders remained superiorly projected. The view today was therefore improved compared to the initial study, but still suboptimal. Her presentation appears similar to that of 9/6, but with some improvement in ligual movement. She presented with oropharyngeal dysphagia characterized by impaired bolus cohesion, and a pharyngeal delay. She demonstrated premature spillage to the valleculae and pyriform sinuses and piecemeal bolus propulsion. She exhibited some impulsivity during the study and liquid boluses were consistently large. Penetration (PAS 3) was noted with individual boluses of nectar thick liquids and this progressed to PAS 5 when consecutive swallows were used. Penetration (PAS 3, 5) was noted with thin liquids via cup. Subsequent aspiration could not be ruled out due to the obscured view, but considering depth of penetration with one bolus and the pt's subsequent coughing, aspiration is suspected. Pt's use of a chin tuck with reduced bolus sizes of thin and nectar thick liquids shows some promise, but pt was unable to consistently implement this strategy. A dysphagia 3 diet with honey thick liquids is recommended with observance of swallowing precautions. SLP will continue to follow fo dysphagia treatment.   Swallow Evaluation Recommendations       SLP Diet Recommendations: Honey thick liquids;Dysphagia 3 (Mech soft) solids   Liquid  Administration via: Cup;No straw   Medication Administration: Crushed with puree   Supervision: Staff to assist with self feeding;Full assist for feeding   Compensations: Slow rate;Small sips/bites;Follow solids with liquid   Postural Changes: Seated upright at 90 degrees   Oral Care Recommendations: Oral care BID       Demone Lyles I. Hardin Negus, [place], [place] Office number [phone]  Horton Marshall 10/05/2021,1:58 PM

## 2021-10-05 NOTE — Progress Notes (Addendum)
Speech Language Pathology Treatment: Dysphagia  Patient Details Name: Erica Mcdaniel MRN: [id number] DOB: [birth year] Today's Date: 10/05/2021 Time: 2119-4174 SLP Time Calculation (min) (ACUTE ONLY): 12 min  Assessment / Plan / Recommendation Clinical Impression  Pt was seen for dysphagia treatment after pt was made NPO on 9/7 due to aspiration concerns and CXR results. Pt did aspirate during the MBS on the day preceding the CXR. Pt was alert and cooperative during the session. Pt did not communicate verbally, but occasionally followed 1-step commands. Pt exhibited impulsive tendencies and inconsistent coughing with honey thick liquids via cup and straw. This was lessened with cues for reduced bolus sizes. Mastication and oral clearance of dysphagia 3 solids were WFL. Considering, MD's concerns, pt's new symptoms, and her impaired laryngeal sensation, a repeat MBS is recommended to re-assess swallow function; it is scheduled for today at 1230.    HPI HPI: Pt is a 51 y.o. female who presented 09/25/21 with R-sided weakness, aphasia, L gaze deviation, and R hemianopsia. MRI brain revealed large acute ischemic left MCA distribution infarct, small volume acute ischemic right ACA/MCA watershed distribution infarct, and small soft tissue contusion at the right frontotemporal scalp. Mechanical thrombectomy was performed with MCA TICI 2b recanalization; occlusion of L distal M2/MCA and partial occlusion of distal A2 branches of L ACA noted with TICI 2c recanalization. Stent x 2 to L carotid artery placed. ETT 8/29-8/30. PMH: GERD, HTN, IBS, polycistic ovarian syndrome, arthritis, morbid obesity, cancer, fibromyalgia, anxiety      SLP Plan  MBS      Recommendations for follow up therapy are one component of a multi-disciplinary discharge planning process, led by the attending physician.  Recommendations may be updated based on patient status, additional functional criteria and insurance authorization.     Recommendations  Diet recommendations:  (deferred until MBS is completed)                Oral Care Recommendations: Oral care BID Follow Up Recommendations: Acute inpatient rehab (3hours/day) Assistance recommended at discharge: Frequent or constant Supervision/Assistance SLP Visit Diagnosis: Dysphagia, oropharyngeal phase (R13.12) Plan: MBS         Erica Mcdaniel I. Erica Mcdaniel, [place], [place] Office number [phone]   Erica Mcdaniel  10/05/2021, 9:36 AM

## 2021-10-06 DIAGNOSIS — I63512 Cerebral infarction due to unspecified occlusion or stenosis of left middle cerebral artery: Secondary | ICD-10-CM | POA: Diagnosis not present

## 2021-10-06 LAB — CBC
HCT: 40.3 % (ref 36.0–46.0)
Hemoglobin: 13.3 g/dL (ref 12.0–15.0)
MCH: 30.1 pg (ref 26.0–34.0)
MCHC: 33 g/dL (ref 30.0–36.0)
MCV: 91.2 fL (ref 80.0–100.0)
Platelets: 402 10*3/uL — ABNORMAL HIGH (ref 150–400)
RBC: 4.42 MIL/uL (ref 3.87–5.11)
RDW: 13.9 % (ref 11.5–15.5)
WBC: 14.3 10*3/uL — ABNORMAL HIGH (ref 4.0–10.5)
nRBC: 0 % (ref 0.0–0.2)

## 2021-10-06 LAB — GLUCOSE, CAPILLARY
Glucose-Capillary: 126 mg/dL — ABNORMAL HIGH (ref 70–99)
Glucose-Capillary: 131 mg/dL — ABNORMAL HIGH (ref 70–99)
Glucose-Capillary: 137 mg/dL — ABNORMAL HIGH (ref 70–99)
Glucose-Capillary: 152 mg/dL — ABNORMAL HIGH (ref 70–99)
Glucose-Capillary: 163 mg/dL — ABNORMAL HIGH (ref 70–99)

## 2021-10-06 LAB — BASIC METABOLIC PANEL
Anion gap: 7 (ref 5–15)
BUN: 17 mg/dL (ref 6–20)
CO2: 23 mmol/L (ref 22–32)
Calcium: 9.1 mg/dL (ref 8.9–10.3)
Chloride: 113 mmol/L — ABNORMAL HIGH (ref 98–111)
Creatinine, Ser: 0.76 mg/dL (ref 0.44–1.00)
GFR, Estimated: >60 mL/min (ref >60)
Glucose, Bld: 125 mg/dL — ABNORMAL HIGH (ref 70–99)
Potassium: 3.4 mmol/L — ABNORMAL LOW (ref 3.5–5.1)
Sodium: 143 mmol/L (ref 135–145)

## 2021-10-06 MED ORDER — FREE WATER
100.0000 mL | Status: DC
  Administered 2021-10-06 – 2021-10-07 (×5): 100 mL

## 2021-10-06 MED ORDER — POTASSIUM CHLORIDE 20 MEQ PO PACK
40.0000 meq | PACK | Freq: Once | ORAL | Status: AC
  Administered 2021-10-06: 40 meq
  Filled 2021-10-06: qty 2

## 2021-10-06 NOTE — Progress Notes (Addendum)
STROKE TEAM PROGRESS NOTE   INTERVAL HISTORY Patient is sitting up in bed with coetrak in place. Patient aphasia, not following commands. Son is at bedside. He will be here for a few hrs. Discussed having a family member to stay with pt to encourage po intake. Per report she ate 100% of her breakfast.   Na improved. WBC improving but still elevated.  Low K. Vitals:   10/06/21 0400 10/06/21 0500 10/06/21 0732 10/06/21 1204  BP: 118/68  125/67 127/63  Pulse: 76  68 74  Resp:      Temp: 97.9 F (36.6 C)  98 F (36.7 C) 98.1 F (36.7 C)  TempSrc: Oral  Oral Oral  SpO2: 96%  99% 97%  Weight:  113.2 kg    Height:       CBC:  Recent Labs  Lab 10/05/21 0353 10/06/21 0806  WBC 17.8* 14.3*  HGB 13.9 13.3  HCT 42.2 40.3  MCV 90.8 91.2  PLT 440* 402*    Basic Metabolic Panel:  Recent Labs  Lab 10/05/21 0353 10/06/21 0806  NA 147* 143  K 3.6 3.4*  CL 115* 113*  CO2 23 23  GLUCOSE 155* 125*  BUN 22* 17  CREATININE 0.75 0.76  CALCIUM 9.5 9.1    Lipid Panel:  No results for input(s): "CHOL", "TRIG", "HDL", "CHOLHDL", "VLDL", "LDLCALC" in the last 168 hours.  HgbA1c:  No results for input(s): "HGBA1C" in the last 168 hours. Urine Drug Screen:  No results for input(s): "LABOPIA", "COCAINSCRNUR", "LABBENZ", "AMPHETMU", "THCU", "LABBARB" in the last 168 hours.  Alcohol Level  No results for input(s): "ETH" in the last 168 hours.  IMAGING past 24 hours DG Swallowing Func-Speech Pathology  Result Date: 10/05/2021 Table formatting from the original result was not included. Objective Swallowing Evaluation: Type of Study: MBS-Modified Barium Swallow Study  Patient Details Name: Erica Mcdaniel MRN: [id number] Date of Birth: May 20, 1970 Today's Date: 10/05/2021 Time: SLP Start Time (ACUTE ONLY): 13 -SLP Stop Time (ACUTE ONLY): 1250 SLP Time Calculation (min) (ACUTE ONLY): 20 min Past Medical History: Past Medical History: Diagnosis Date  Anxiety   Arthritis   Cancer ([place])   skin  squamous cell arm  Depression   Fibromyalgia   GERD (gastroesophageal reflux disease)   Hypertension   IBS (irritable bowel syndrome)   Pneumonia   hx  Polycystic ovarian disease   Tendonitis   Tubular adenoma of colon 2018  multiple Past Surgical History: Past Surgical History: Procedure Laterality Date  APPENDECTOMY    BACK SURGERY    BACK SURGERY  2010  rods put in  IR ANGIO INTRA EXTRACRAN SEL COM CAROTID INNOMINATE UNI R MOD SED  09/25/2021  IR CT HEAD LTD  09/25/2021  IR CT HEAD LTD  09/25/2021  IR INTRAVSC STENT CERV CAROTID W/O EMB-PROT MOD SED INC ANGIO  09/25/2021  IR PERCUTANEOUS ART THROMBECTOMY/INFUSION INTRACRANIAL INC DIAG ANGIO  09/25/2021  IR US GUIDE VASC ACCESS LEFT  09/25/2021  IR US GUIDE VASC ACCESS RIGHT  09/25/2021  KNEE ARTHROSCOPY Left 13  NEUROPLASTY / TRANSPOSITION MEDIAN NERVE AT CARPAL TUNNEL BILATERAL    OVARIAN CYST SURGERY  1983  RADIOLOGY WITH ANESTHESIA N/A 09/25/2021  Procedure: IR WITH ANESTHESIA;  Surgeon: Luanne Bras, MD;  Location: [place];  Service: Radiology;  Laterality: N/A;  ROTATOR CUFF REPAIR  2003,04  right, and elbow,left  TENDON RECONSTRUCTION Right 12/08/2012  Procedure: ELBOW TENDON RECONSTRUCTION/Right Lateral Epicondylar Debridement with Repair.;  Surgeon: Garald Balding, MD;  Location: [facility]  OR;  Service: Orthopedics;  Laterality: Right;  Right Lateral Epicondylar Debridement with Repair.  TRIGGER FINGER RELEASE Right 08 HPI: Pt is a 51 y.o. female who presented 09/25/21 with R-sided weakness, aphasia, L gaze deviation, and R hemianopsia. MRI brain revealed large acute ischemic left MCA distribution infarct, small volume acute ischemic right ACA/MCA watershed distribution infarct, and small soft tissue contusion at the right frontotemporal scalp. Mechanical thrombectomy was performed with MCA TICI 2b recanalization; occlusion of L distal M2/MCA and partial occlusion of distal A2 branches of L ACA noted with TICI 2c recanalization. Stent x 2 to L carotid artery  placed. ETT 8/29-8/30. PMH: GERD, HTN, IBS, polycistic ovarian syndrome, arthritis, morbid obesity, cancer, fibromyalgia, anxiety  No data recorded  Recommendations for follow up therapy are one component of a multi-disciplinary discharge planning process, led by the attending physician.  Recommendations may be updated based on patient status, additional functional criteria and insurance authorization. Assessment / Plan / Recommendation   10/05/2021   1:52 PM Clinical Impressions Clinical Impression With consideration of the pt's shoulders empeding the view of the larynx during the MBS on 9/6, today's stidy was conducted in the bariatric radiology suite. Despite pt's adequate space and repositioning from the SLP and radiology tech, pt's shoulders remained superiorly projected. The view today was therefore improved compared to the initial study, but still suboptimal. Her presentation appears similar to that of 9/6, but with some improvement in ligual movement. She presented with oropharyngeal dysphagia characterized by impaired bolus cohesion, and a pharyngeal delay. She demonstrated premature spillage to the valleculae and pyriform sinuses and piecemeal bolus propulsion. She exhibited some impulsivity during the study and liquid boluses were consistently large. Penetration (PAS 3) was noted with individual boluses of nectar thick liquids and this progressed to PAS 5 when consecutive swallows were used. Penetration (PAS 3, 5) was noted with thin liquids via cup. Subsequent aspiration could not be ruled out due to the obscured view, but considering depth of penetration with one bolus and the pt's subsequent coughing, aspiration is suspected. Pt's use of a chin tuck with reduced bolus sizes of thin and nectar thick liquids shows some promise, but pt was unable to consistently implement this strategy. A dysphagia 3 diet with honey thick liquids is recommended with observance of swallowing precautions. SLP will continue to  follow fo dysphagia treatment. SLP Visit Diagnosis Dysphagia, oropharyngeal phase (R13.12) Impact on safety and function Mild aspiration risk     10/05/2021   1:52 PM Treatment Recommendations Treatment Recommendations Therapy as outlined in treatment plan below     10/05/2021   1:52 PM Prognosis Prognosis for Safe Diet Advancement Good Barriers to Reach Goals Severity of deficits;Language deficits   10/05/2021   1:52 PM Diet Recommendations SLP Diet Recommendations Honey thick liquids;Dysphagia 3 (Mech soft) solids Liquid Administration via Cup;No straw Medication Administration Crushed with puree Compensations Slow rate;Small sips/bites;Follow solids with liquid Postural Changes Seated upright at 90 degrees     10/05/2021   1:52 PM Other Recommendations Oral Care Recommendations Oral care BID Follow Up Recommendations Acute inpatient rehab (3hours/day) Assistance recommended at discharge Frequent or constant Supervision/Assistance Functional Status Assessment Patient has had a recent decline in their functional status and demonstrates the ability to make significant improvements in function in a reasonable and predictable amount of time.   10/05/2021   1:52 PM Frequency and Duration  Speech Therapy Frequency (ACUTE ONLY) min 2x/week Treatment Duration 2 weeks     10/05/2021   1:52 PM  Oral Phase Oral Phase Impaired Oral - Honey Cup Lingual pumping;Decreased bolus cohesion;Premature spillage;Delayed oral transit Oral - Nectar Cup Lingual pumping;Decreased bolus cohesion;Premature spillage;Delayed oral transit Oral - Nectar Straw Lingual pumping;Decreased bolus cohesion;Premature spillage;Delayed oral transit Oral - Thin Teaspoon NT Oral - Thin Cup Lingual pumping;Decreased bolus cohesion;Premature spillage;Delayed oral transit Oral - Puree Lingual pumping;Delayed oral transit Oral - Mech Soft Lingual pumping;Delayed oral transit Oral - Pill Lingual pumping;Delayed oral transit    10/05/2021   1:52 PM Pharyngeal Phase Pharyngeal  Phase Impaired Pharyngeal- Honey Cup Delayed swallow initiation-vallecula Pharyngeal- Nectar Cup Delayed swallow initiation-pyriform sinuses;Penetration/Aspiration before swallow;Penetration/Aspiration during swallow Pharyngeal Material enters airway, remains ABOVE vocal cords and not ejected out;Material enters airway, CONTACTS cords and not ejected out Pharyngeal- Nectar Straw Delayed swallow initiation-pyriform sinuses;Penetration/Aspiration before swallow;Penetration/Aspiration during swallow Pharyngeal Material enters airway, remains ABOVE vocal cords and not ejected out;Material enters airway, CONTACTS cords and not ejected out Pharyngeal- Thin Teaspoon NT Pharyngeal- Thin Cup Delayed swallow initiation-pyriform sinuses;Penetration/Aspiration before swallow;Penetration/Aspiration during swallow Pharyngeal Material enters airway, remains ABOVE vocal cords and not ejected out;Material enters airway, CONTACTS cords and not ejected out;Material enters airway, passes BELOW cords and not ejected out despite cough attempt by patient Pharyngeal- Puree Delayed swallow initiation-vallecula Pharyngeal- Mechanical Soft NT Pharyngeal- Regular Delayed swallow initiation-vallecula    10/05/2021   1:52 PM Cervical Esophageal Phase  Cervical Esophageal Phase [facility] Shanika I. Hardin Negus, [place], [place] Office number [phone] Horton Marshall 10/05/2021, 2:03 PM                      PHYSICAL EXAM  Physical Exam  Constitutional: Obese caucasian female in no acute distress. Sleeping after some activity this morning.  Respiratory: Respirations regular and unlabored on room air  Neuro: Sleep but arousable briefly. Global aphasia and nonverbal, not following commands.  Right facial droop. Tongue protrusion not cooperative.  Will keep left arm/leg off bed when lifted for her.  Right hemiplegia, decreased muscle tone.   Sensation, coordination and gait not tested due to  aphasia.    ASSESSMENT/PLAN Erica Mcdaniel is a 51 y.o. female with history of GERD, hypertension, IBS, polycystic ovarian syndrome, arthritis, morbid obesity presenting with right sided weakness, left gaze deviation, right hemianopsia and expressive aphasia after a fall.  CT showed dense left MCA and she was taken for emergent mechanical thrombectomy. Remained intubated post procedure. Extubated 8/30. PT/OT/ST pending  Stroke:  Left MCA and b/l ACA infarcts due to L ICA, M1 and A2/3 occlusion s/p IR with rescue proximal L ICA stent, etiology likely large vessel disease form left ICA atherosclerosis, but ddx including cardioembolic source Code Stroke CT head hyperdense left MCA consistent with thrombosis. ASPECTS 10.    CTA head & neck Emergent large vessel occlusion at the left ICA origin. Short segment of intracranial ICA reconstitution with subsequent left MCA thrombosis. Atherosclerosis. Suspect moderate narrowing at the right V1 segment. S/p IR with TICI2b on left M2 and TICI2c on left A2 MRI Large evolving acute ischemic left MCA distribution infarct. Additional small bilateral ACA and/or ACA/MCA watershed distribution infarct.  MRA  Interval revascularization of previously seen left ICA/MCA occlusion. Focal flow defect at the distal cervical left ICA Carotid Doppler  Left distal ICA stent consistent with 50-75% stenosis. 2D Echo EF 65-70% Recommend 30 day monitoring as outpt to rule out afib LDL 137 HgbA1c 5.8 UDS positive for THC Hypercoagulable work up negative VTE prophylaxis - Lovenox No antithrombotic prior to admission,  now on aspirin 81 mg daily and Brilinta (ticagrelor) 90 mg bid post ICA stenting Therapy recommendations:  CIR vs. SNF Disposition:  pending  Hypertension Home meds:  hydrochlorothiazide Stable on the high end On lisinopril 10->20 Long-term BP goal normotensive  Hyperlipidemia Home meds: Zocor 10 and zetia LDL 137, goal < 70 Now on Zetia '10mg'$ ,  Crestor '20mg'$  Continue statin on discharge  Dysphagia Passed swallow on dysphagia 2 and honey thick liquid -> dys 3 with honey thick->NPO given CXR concerning for aspiration -> dys 3 and honey thick Change TF to nocturnal feeding Allow po intake during the day. Ate all her breakfast this am. Calorie count pending. Na now 143.con't  free water but decrease to 100cc q4hrs.  Leukocytosis  WBC 14.2->15.1->15.9->15.1->18-> 15.6->17.8>14.3 UA negative CXR Patchy opacities in the left-greater-than-right lung base may reflect atelectasis or infiltrate/aspiration Afebrile  LE venous doppler no DVT Continue monitoring  Tobacco abuse Current smoker Smoking cessation counseling will be provided  B12 deficiency B12 = 161 B12 supplement with B12 1057mg IM x 1 Continue B12 10070m po daily  Other Stroke Risk Factors THC use, cessation education will be provided Obesity, Body mass index is 37.95 kg/m., BMI >/= 30 associated with increased stroke risk, recommend weight loss, diet and exercise as appropriate   Other Active Problems Chronic lower back pain with right sided sciatica, fibromyalgia Home meds: Tramadol, zanaflex, cyclobenzaprine, celebrex IBS- on bentyl- hold for now GERD- protonix  Lethargy - amantadine '200mg'$  bid for arousal .  Hypernatremia - resolved. On free water. Low K: replaced this am.  Hospital day # 11  10/06/2021 12:32 PM    To contact Stroke Continuity provider, please refer to [email or web address], contact General Neurology

## 2021-10-07 DIAGNOSIS — I63512 Cerebral infarction due to unspecified occlusion or stenosis of left middle cerebral artery: Secondary | ICD-10-CM | POA: Diagnosis not present

## 2021-10-07 LAB — BASIC METABOLIC PANEL
Anion gap: 11 (ref 5–15)
BUN: 17 mg/dL (ref 6–20)
CO2: 25 mmol/L (ref 22–32)
Calcium: 9.3 mg/dL (ref 8.9–10.3)
Chloride: 107 mmol/L (ref 98–111)
Creatinine, Ser: 0.71 mg/dL (ref 0.44–1.00)
GFR, Estimated: >60 mL/min (ref >60)
Glucose, Bld: 137 mg/dL — ABNORMAL HIGH (ref 70–99)
Potassium: 3.8 mmol/L (ref 3.5–5.1)
Sodium: 143 mmol/L (ref 135–145)

## 2021-10-07 LAB — CBC
HCT: 41.5 % (ref 36.0–46.0)
Hemoglobin: 13.6 g/dL (ref 12.0–15.0)
MCH: 30.5 pg (ref 26.0–34.0)
MCHC: 32.8 g/dL (ref 30.0–36.0)
MCV: 93 fL (ref 80.0–100.0)
Platelets: 408 10*3/uL — ABNORMAL HIGH (ref 150–400)
RBC: 4.46 MIL/uL (ref 3.87–5.11)
RDW: 13.9 % (ref 11.5–15.5)
WBC: 14.5 10*3/uL — ABNORMAL HIGH (ref 4.0–10.5)
nRBC: 0 % (ref 0.0–0.2)

## 2021-10-07 LAB — GLUCOSE, CAPILLARY
Glucose-Capillary: 111 mg/dL — ABNORMAL HIGH (ref 70–99)
Glucose-Capillary: 124 mg/dL — ABNORMAL HIGH (ref 70–99)
Glucose-Capillary: 114 mg/dL — ABNORMAL HIGH (ref 70–99)
Glucose-Capillary: 145 mg/dL — ABNORMAL HIGH (ref 70–99)
Glucose-Capillary: 122 mg/dL — ABNORMAL HIGH (ref 70–99)
Glucose-Capillary: 97 mg/dL (ref 70–99)

## 2021-10-07 NOTE — Progress Notes (Signed)
STROKE TEAM PROGRESS NOTE   INTERVAL HISTORY Patient is sitting up in bed with coetrak in place. Patient aphasia, not following commands. No family at bedside. Per report from nurse, she ate 100% of meals.  Na improved. WBC improving but still elevated.  Low K. Vitals:   10/07/21 0000 10/07/21 0400 10/07/21 0500 10/07/21 0808  BP: 134/72 (!) 140/74  117/67  Pulse: 80 78  77  Resp: 17 19    Temp: 97.9 F (36.6 C) 98.2 F (36.8 C)  98.3 F (36.8 C)  TempSrc: Oral Oral  Oral  SpO2: 98% 100%  98%  Weight:   113.2 kg   Height:       CBC:  Recent Labs  Lab 10/06/21 0806 10/07/21 0546  WBC 14.3* 14.5*  HGB 13.3 13.6  HCT 40.3 41.5  MCV 91.2 93.0  PLT 402* 408*    Basic Metabolic Panel:  Recent Labs  Lab 10/06/21 0806 10/07/21 0546  NA 143 143  K 3.4* 3.8  CL 113* 107  CO2 23 25  GLUCOSE 125* 137*  BUN 17 17  CREATININE 0.76 0.71  CALCIUM 9.1 9.3    Lipid Panel:  No results for input(s): "CHOL", "TRIG", "HDL", "CHOLHDL", "VLDL", "LDLCALC" in the last 168 hours.  HgbA1c:  No results for input(s): "HGBA1C" in the last 168 hours. Urine Drug Screen:  No results for input(s): "LABOPIA", "COCAINSCRNUR", "LABBENZ", "AMPHETMU", "THCU", "LABBARB" in the last 168 hours.  Alcohol Level  No results for input(s): "ETH" in the last 168 hours.  IMAGING past 24 hours No results found.  PHYSICAL EXAM  Physical Exam  Constitutional: Obese caucasian female in no acute distress.  More alert and awake this am. Still aphasic.   Respiratory: Respirations regular and unlabored on room air  Neuro: Sleep but arousable briefly. Global aphasia and nonverbal, not following commands.  Right facial droop. Tongue protrusion not cooperative.  Will keep left arm/leg off bed when lifted for her.  Right hemiplegia, decreased muscle tone.   Sensation, coordination and gait not tested due to aphasia.    ASSESSMENT/PLAN [name] is a 51 y.o. female with history of GERD,  hypertension, IBS, polycystic ovarian syndrome, arthritis, morbid obesity presenting with right sided weakness, left gaze deviation, right hemianopsia and expressive aphasia after a fall.  CT showed dense left MCA and she was taken for emergent mechanical thrombectomy. Remained intubated post procedure. Extubated 8/30. PT/OT/ST pending  Stroke:  Left MCA and b/l ACA infarcts due to L ICA, M1 and A2/3 occlusion s/p IR with rescue proximal L ICA stent, etiology likely large vessel disease form left ICA atherosclerosis, but ddx including cardioembolic source Code Stroke CT head hyperdense left MCA consistent with thrombosis. ASPECTS 10.    CTA head & neck Emergent large vessel occlusion at the left ICA origin. Short segment of intracranial ICA reconstitution with subsequent left MCA thrombosis. Atherosclerosis. Suspect moderate narrowing at the right V1 segment. S/p IR with TICI2b on left M2 and TICI2c on left A2 MRI Large evolving acute ischemic left MCA distribution infarct. Additional small bilateral ACA and/or ACA/MCA watershed distribution infarct.  MRA  Interval revascularization of previously seen left ICA/MCA occlusion. Focal flow defect at the distal cervical left ICA Carotid Doppler  Left distal ICA stent consistent with 50-75% stenosis. 2D Echo EF 65-70% Recommend 30 day monitoring as outpt to rule out afib LDL 137 HgbA1c 5.8 UDS positive for THC Hypercoagulable work up negative VTE prophylaxis - Lovenox No antithrombotic prior to  admission, now on aspirin 81 mg daily and Brilinta (ticagrelor) 90 mg bid post ICA stenting Therapy recommendations:  CIR vs. SNF Disposition:  pending  Hypertension Home meds:  hydrochlorothiazide Stable on the high end On lisinopril 10->20 Long-term BP goal normotensive  Hyperlipidemia Home meds: Zocor 10 and zetia LDL 137, goal < 70 Now on Zetia '10mg'$ , Crestor '20mg'$  Continue statin on discharge  Dysphagia Passed swallow on dysphagia 2 and honey  thick liquid -> dys 3 with honey thick->NPO given CXR concerning for aspiration -> dys 3 and honey thick Change TF to nocturnal feeding Allow po intake during the day. Calorie count pending. Na now 143.con't  free water but  100cc q4hrs.  Leukocytosis  WBC 14.2->15.1->15.9->15.1->18-> 15.6->17.8>14.3>14.5 UA negative CXR Patchy opacities in the left-greater-than-right lung base may reflect atelectasis or infiltrate/aspiration Afebrile  LE venous doppler no DVT Continue monitoring  Tobacco abuse Current smoker Smoking cessation counseling will be provided  B12 deficiency B12 = 161 B12 supplement with B12 1059mg IM x 1 Continue B12 10019m po daily  Other Stroke Risk Factors THC use, cessation education will be provided Obesity, Body mass index is 37.95 kg/m., BMI >/= 30 associated with increased stroke risk, recommend weight loss, diet and exercise as appropriate   Other Active Problems Chronic lower back pain with right sided sciatica, fibromyalgia Home meds: Tramadol, zanaflex, cyclobenzaprine, celebrex IBS- on bentyl- hold for now GERD- protonix  Lethargy - amantadine '200mg'$  bid for arousal .  Hypernatremia - resolved. On free water.   Hospital day # 12  10/07/2021 10:40 AM    To contact Stroke Continuity provider, please refer to [email or web address] hours, contact General Neurology

## 2021-10-07 NOTE — Progress Notes (Signed)
Cortrek has been removed.  Patient tolerated well.

## 2021-10-08 DIAGNOSIS — I63512 Cerebral infarction due to unspecified occlusion or stenosis of left middle cerebral artery: Secondary | ICD-10-CM | POA: Diagnosis not present

## 2021-10-08 LAB — BASIC METABOLIC PANEL
Anion gap: 8 (ref 5–15)
BUN: 16 mg/dL (ref 6–20)
CO2: 26 mmol/L (ref 22–32)
Calcium: 9.4 mg/dL (ref 8.9–10.3)
Chloride: 107 mmol/L (ref 98–111)
Creatinine, Ser: 0.83 mg/dL (ref 0.44–1.00)
GFR, Estimated: >60 mL/min (ref >60)
Glucose, Bld: 125 mg/dL — ABNORMAL HIGH (ref 70–99)
Potassium: 3.6 mmol/L (ref 3.5–5.1)
Sodium: 141 mmol/L (ref 135–145)

## 2021-10-08 LAB — CBC
HCT: 43.1 % (ref 36.0–46.0)
Hemoglobin: 14.4 g/dL (ref 12.0–15.0)
MCH: 30.4 pg (ref 26.0–34.0)
MCHC: 33.4 g/dL (ref 30.0–36.0)
MCV: 90.9 fL (ref 80.0–100.0)
Platelets: 436 10*3/uL — ABNORMAL HIGH (ref 150–400)
RBC: 4.74 MIL/uL (ref 3.87–5.11)
RDW: 13.8 % (ref 11.5–15.5)
WBC: 13.5 10*3/uL — ABNORMAL HIGH (ref 4.0–10.5)
nRBC: 0 % (ref 0.0–0.2)

## 2021-10-08 LAB — GLUCOSE, CAPILLARY
Glucose-Capillary: 106 mg/dL — ABNORMAL HIGH (ref 70–99)
Glucose-Capillary: 110 mg/dL — ABNORMAL HIGH (ref 70–99)
Glucose-Capillary: 140 mg/dL — ABNORMAL HIGH (ref 70–99)
Glucose-Capillary: 136 mg/dL — ABNORMAL HIGH (ref 70–99)
Glucose-Capillary: 155 mg/dL — ABNORMAL HIGH (ref 70–99)

## 2021-10-08 MED ORDER — AMANTADINE HCL 50 MG/5ML PO SOLN
100.0000 mg | Freq: Two times a day (BID) | ORAL | Status: DC
Start: 2021-10-08 — End: 2021-10-09
  Administered 2021-10-08: 100 mg via ORAL
  Filled 2021-10-08 (×2): qty 10

## 2021-10-08 NOTE — Progress Notes (Addendum)
Nutrition Follow-up  DOCUMENTATION CODES:   Not applicable  INTERVENTION:  Continue current diet as ordered per SLP Feeding assistance Encourage PO intake MVI with minerals daily Magic cup TID with meals, each supplement provides 290 kcal and 9 grams of protein  NUTRITION DIAGNOSIS:   Inadequate oral intake related to inability to eat as evidenced by NPO status. - remains adequate  GOAL:   Patient will meet greater than or equal to 90% of their needs - progressing, diet and supplements in place  MONITOR:   PO intake, Supplement acceptance, I & O's, Diet advancement  REASON FOR ASSESSMENT:   Consult Enteral/tube feeding initiation and management  ASSESSMENT:   Pt with PMH of HTN, tobacco abuse (2PPD), + THC, GERD, colon cancer, IBS, PCOS, skin ca, fibromyalgia, depression and anxiety admitted with dense L MCA stroke s/p thrombectomy.  8/31 - Cortrak placed  9/6 s/p MBS- recommend dysphagia 2, honey thick liquids 9/7 diet advanced to dysphagia 3, honey thick liquids 9/8 - MBS, DYS3, honey 9/11 - cortrak removed  Pt resting in bed, aphasic and does not respond to questions. Son at bedside provides hx. Pt with good appetite, most meals 100% consumed and cortrak  removed yesterday.   Per son, currently working with CM to determine facility to discharge to.   Nutritionally Relevant Medications: Scheduled Meds:  docusate  100 mg Per Tube BID   multivitamin with minerals  1 tablet Per Tube Daily   pantoprazole  40 mg Per Tube Daily   polyethylene glycol  17 g Per Tube Daily   rosuvastatin  20 mg Per Tube Daily   cyanocobalamin  500 mcg Per Tube Daily   Labs Reviewed  NUTRITION - FOCUSED PHYSICAL EXAM: Flowsheet Row Most Recent Value  Orbital Region No depletion  Upper Arm Region No depletion  Thoracic and Lumbar Region No depletion  Buccal Region No depletion  Temple Region No depletion  Clavicle Bone Region No depletion  Clavicle and Acromion Bone Region No  depletion  Scapular Bone Region No depletion  Dorsal Hand No depletion  Patellar Region No depletion  Anterior Thigh Region No depletion  Posterior Calf Region No depletion  Edema (RD Assessment) Mild  Hair Reviewed  Eyes Reviewed  Mouth Reviewed  Skin Reviewed  Nails Reviewed       Diet Order:   Diet Order             DIET DYS 3 Room service appropriate? No; Fluid consistency: Honey Thick  Diet effective now                   EDUCATION NEEDS:   No education needs have been identified at this time  Skin:  Skin Assessment: Reviewed RN Assessment  Last BM:  9/9  Height:   Ht Readings from Last 1 Encounters:  09/25/21 '5\' 8"'$  (1.727 m)    Weight:   Wt Readings from Last 1 Encounters:  10/07/21 113.2 kg    BMI:  Body mass index is 37.95 kg/m.  Estimated Nutritional Needs:   Kcal:  1800-2000  Protein:  90-105 grams  Fluid:  >1.8 L/day    Ranell Patrick, RD, LDN Clinical Dietitian RD pager # available in [facility]  After hours/weekend pager # available in [facility]

## 2021-10-08 NOTE — Progress Notes (Signed)
  Inpatient Rehabilitation Admissions Coordinator   Met with patient at bedside for rehab assessment as well as contacted her son, Rolla Plate, by phone. Patient is aphasic . With son I  discussed goals and expectations of a possible CIR admit. He and his girlfriend are unable to provide 24/7 assist after a CIR admit. She would have 6 to 8 hrs without caregiver supports. I explained that she will need prolonged rehab before returning home without caregiver supports when they worked. I recommend SNF rehab at this time. I discussed with TOC SW and RN CM. We will not purse Cir admit at this time and will sign off. Please call me with any questions.   Danne Baxter, RN, MSN Rehab Admissions Coordinator [phone]

## 2021-10-08 NOTE — Progress Notes (Signed)
Occupational Therapy Treatment Patient Details Name: Erica Mcdaniel MRN: [id number] DOB: 21-Oct-1970 Today's Date: 10/08/2021   History of present illness Pt is a 51 y.o. female who presented 09/25/21 with R-sided weakness, aphasia, L gaze deviation, and R hemianopsia. Pt outside window for TNKase. MRI revealed large acute ischemic left MCA distribution infarct, small volume acute ischemic right ACA/MCA watershed distribution infarct, and small soft tissue contusion at the right frontotemporal scalp. S/p cerebral angiogram 8/29. ETT 8/29-8/30. PMH: GERD, HTN, IBS, polycistic ovarian syndrome, arthritis, morbid obesity, cancer, fibromyalgia, anxiety   OT comments  Pt progressing well.  Completing transfers with stedy min-mod assist +2, engaged in grooming tasks at sink with up to max assist while sitting.  Requires hand over hand demonstration, gesturing and cueing to completing ADL tasks, with decreased sequencing, attention and problem solving as well as R inattention.  Max assist to sequencing washing face, but once wash cloth was wet pt able to complete without cueing; max assist for combing hair and brushing teeth. Dynamically up to min assist sitting on stedy, mod when standing.  Noted increased flexor synergy tone in R UE, especially in elbow; grasp improved on stedy.  Remains limited by cognition, communication, balance and R hemiparesis. Highly recommend AIR at dc. Will follow acutely.    Recommendations for follow up therapy are one component of a multi-disciplinary discharge planning process, led by the attending physician.  Recommendations may be updated based on patient status, additional functional criteria and insurance authorization.    Follow Up Recommendations  Acute inpatient rehab (3hours/day)    Assistance Recommended at Discharge Frequent or constant Supervision/Assistance  Patient can return home with the following  A lot of help with walking and/or transfers;Two people to  help with walking and/or transfers;A lot of help with bathing/dressing/bathroom;Two people to help with bathing/dressing/bathroom;Assistance with cooking/housework;Assistance with feeding;Direct supervision/assist for medications management;Assist for transportation;Direct supervision/assist for financial management;Help with stairs or ramp for entrance   Equipment Recommendations  Other (comment) (defer)    Recommendations for Other Services Rehab consult    Precautions / Restrictions Precautions Precautions: Fall Precaution Comments: R hemiparesis Restrictions Weight Bearing Restrictions: No       Mobility Bed Mobility Overal bed mobility: Needs Assistance Bed Mobility: Rolling, Sidelying to Sit Rolling: Min assist Sidelying to sit: Min assist, +2 for safety/equipment       General bed mobility comments: min assist to guide L arm to R rail pulling self to roll, min assist to guide L LE to push LEs off side of bed/trunk support and sequencing coming to upright at EOB    Transfers Overall transfer level: Needs assistance   Transfers: Sit to/from Stand, Bed to chair/wheelchair/BSC Sit to Stand: Mod assist, +2 safety/equipment, Min assist   Squat pivot transfers: Min assist, Mod assist, +2 safety/equipment       General transfer comment: from EOB mod assist +2 stedy, from stedy surface min assist +2 safety Transfer via Lift Equipment: Stedy   Balance Overall balance assessment: Needs assistance Sitting-balance support: Feet supported Sitting balance-Leahy Scale: Fair Sitting balance - Comments: min guard for safety   Standing balance support: Bilateral upper extremity supported, During functional activity Standing balance-Leahy Scale: Poor Standing balance comment: UE support on stedy                           ADL either performed or assessed with clinical judgement   ADL Overall ADL's : Needs assistance/impaired  Grooming: Brushing hair;Oral  care;Sitting;Wash/dry face;Maximal assistance Grooming Details (indicate cue type and reason): at sink in steady, cueing to look towards R side to locate comb reaching to retrieve with L hand.  Requires hand over hand to comb hair, cueing to continue and engage R side.  Hand over hand to turn sink on/off, able to repeat after completing with therapist.  Washing face with min assist.  Using R hand to stabilize while opening toothpaste with hand over hand support, multimodal cueing for sequencing.             Lower Body Dressing: Maximal assistance;Bed level Lower Body Dressing Details (indicate cue type and reason): able to don L sock after threading it over her toes, support for R LE movement and threading sock on toes.  Min assist +2 in standing Toilet Transfer: Minimal assistance;Moderate assistance;+2 for safety/equipment Toilet Transfer Details (indicate cue type and reason): stedy         Functional mobility during ADLs: Minimal assistance;Moderate assistance;+2 for physical assistance;+2 for safety/equipment (stedy) General ADL Comments: remains limited by cognition, commuincation, and R sided hemiparesis    Extremity/Trunk Assessment Upper Extremity Assessment Upper Extremity Assessment: RUE deficits/detail RUE Deficits / Details: flaccid, increased flexion tone in elbow but noted increased active grasp with R hand on stedy RUE Sensation: decreased light touch;decreased proprioception RUE Coordination: decreased fine motor;decreased gross motor LUE Deficits / Details: pushes strongly with LUE   Lower Extremity Assessment Lower Extremity Assessment: Defer to PT evaluation        Vision   Additional Comments: R inattention?   Perception     Praxis      Cognition Arousal/Alertness: Awake/alert Behavior During Therapy: Flat affect Overall Cognitive Status: Difficult to assess Area of Impairment: Attention, Following commands, Problem solving                    Current Attention Level: Sustained   Following Commands: Follows one step commands inconsistently, Follows one step commands with increased time     Problem Solving: Slow processing, Decreased initiation, Difficulty sequencing, Requires verbal cues, Requires tactile cues General Comments: pt with flat affect throughout session, requires mulitmodal cueing, repetition, gestures and hand over hand for initation of tasks. Able to repeat completing after demonstrating (esp with ADl tasks ) at sink.        Exercises Exercises: Other exercises Other Exercises Other Exercises: PROM of R UE from shoulder to hand, prolonged stretch to R elbow Other Exercises: balance in stedy at sink, functional reaching x 3 with L UE to R side to place items on shelf-- mod assist to maintain balance with R lateral lean    Shoulder Instructions       General Comments      Pertinent Vitals/ Pain       Pain Assessment Pain Assessment: Faces Faces Pain Scale: No hurt Pain Intervention(s): Monitored during session  Home Living                                          Prior Functioning/Environment              Frequency  Min 2X/week        Progress Toward Goals  OT Goals(current goals can now be found in the care plan section)  Progress towards OT goals: Progressing toward goals  Acute Rehab OT Goals Patient Stated Goal: unable  to state OT Goal Formulation: Patient unable to participate in goal setting Time For Goal Achievement: 10/10/21 Potential to Achieve Goals: Good  Plan Discharge plan remains appropriate;Frequency remains appropriate    Co-evaluation    PT/OT/SLP Co-Evaluation/Treatment: Yes Reason for Co-Treatment: To address functional/ADL transfers;For patient/therapist safety   OT goals addressed during session: ADL's and self-care      AM-PAC OT "6 Clicks" Daily Activity     Outcome Measure   Help from another person eating meals?: Total Help from  another person taking care of personal grooming?: A Lot Help from another person toileting, which includes using toliet, bedpan, or urinal?: Total Help from another person bathing (including washing, rinsing, drying)?: Total Help from another person to put on and taking off regular upper body clothing?: Total Help from another person to put on and taking off regular lower body clothing?: Total 6 Click Score: 7    End of Session Equipment Utilized During Treatment: Gait belt  OT Visit Diagnosis: Unsteadiness on feet (R26.81);Other abnormalities of gait and mobility (R26.89);Repeated falls (R29.6);History of falling (Z91.81);Pain   Activity Tolerance Patient tolerated treatment well   Patient Left in chair;with call bell/phone within reach;with chair alarm set   Nurse Communication Mobility status        Time: 0761-5183 OT Time Calculation (min): 30 min  Charges: OT General Charges $OT Visit: 1 Visit OT Treatments $Self Care/Home Management : 8-22 mins  Jolaine Artist, [place] Office [phone]   Delight Stare 10/08/2021, 11:49 AM

## 2021-10-08 NOTE — Progress Notes (Signed)
CIR to reassess for potential admission.

## 2021-10-08 NOTE — Progress Notes (Signed)
Physical Therapy Treatment Patient Details Name: Erica Mcdaniel MRN: [id number] DOB: 1970/02/26 Today's Date: 10/08/2021   History of Present Illness Pt is a 51 y.o. female who presented 09/25/21 with R-sided weakness, aphasia, L gaze deviation, and R hemianopsia. Pt outside window for TNKase. MRI revealed large acute ischemic left MCA distribution infarct, small volume acute ischemic right ACA/MCA watershed distribution infarct, and small soft tissue contusion at the right frontotemporal scalp. S/p cerebral angiogram 8/29. ETT 8/29-8/30. PMH: GERD, HTN, IBS, polycistic ovarian syndrome, arthritis, morbid obesity, cancer, fibromyalgia, anxiety    PT Comments    Pt progressing towards physical therapy goals. Was able to perform transfers and ADL tasks at the sink within [place] with up to +2 mod assist for balance support and safety. Noted HR up to 143 bpm during standing activity and pt with heavy breathing. Recovered within 20" with seated rest break. Pt able to achieve full R knee extension in standing with manual support from therapist, but otherwise RLE was flexed and resting against the front of the Stedy. This in conjunction with weight bearing mostly on the L resulted in a shift in pelvic alignment and lumbar torsion. Continue to feel this patient would benefit from intensive, multidisciplinary therapies at the AIR level to maximize functional independence, decrease risk for falls, and decrease burden of care prior to return home with family support.    Recommendations for follow up therapy are one component of a multi-disciplinary discharge planning process, led by the attending physician.  Recommendations may be updated based on patient status, additional functional criteria and insurance authorization.  Follow Up Recommendations  Acute inpatient rehab (3hours/day)     Assistance Recommended at Discharge Frequent or constant Supervision/Assistance  Patient can return home with the  following Two people to help with walking and/or transfers;Two people to help with bathing/dressing/bathroom;Assistance with cooking/housework;Assistance with feeding;Direct supervision/assist for medications management;Direct supervision/assist for financial management;Assist for transportation;Help with stairs or ramp for entrance   Equipment Recommendations  Rolling walker (2 wheels);BSC/3in1;Wheelchair (measurements PT);Wheelchair cushion (measurements PT);Hospital bed;Other (comment)    Recommendations for Other Services Rehab consult     Precautions / Restrictions Precautions Precautions: Fall Precaution Comments: R hemiparesis Restrictions Weight Bearing Restrictions: No     Mobility  Bed Mobility Overal bed mobility: Needs Assistance Bed Mobility: Rolling, Sidelying to Sit Rolling: Min assist Sidelying to sit: Min assist, +2 for safety/equipment       General bed mobility comments: min assist to guide L arm to R rail pulling self to roll, min assist to guide L LE to push LEs off side of bed/trunk support and sequencing coming to upright at EOB    Transfers Overall transfer level: Needs assistance Equipment used: Ambulation equipment used Transfers: Sit to/from Stand, Bed to chair/wheelchair/BSC Sit to Stand: Mod assist, +2 safety/equipment, Min assist     Squat pivot transfers: Min assist, Mod assist, +2 safety/equipment     General transfer comment: from EOB mod assist +2 for safety, from stedy surface min assist +2 safety. Therapist holding RUE on the center bar of the Stedy and pt pushing up through R elbow (therapist supporting) to assist with stand. Transfer via Lift Equipment: Stedy  Ambulation/Gait         Gait velocity: Decreased Gait velocity interpretation: <1.31 ft/sec, indicative of household ambulator   General Gait Details: Unable to progress to gait training at this time.   Stairs             Emergency planning/management officer  Modified Rankin  (Stroke Patients Only) Modified Rankin (Stroke Patients Only) Pre-Morbid Rankin Score: No symptoms Modified Rankin: Severe disability     Balance Overall balance assessment: Needs assistance Sitting-balance support: Feet supported Sitting balance-Leahy Scale: Fair Sitting balance - Comments: min guard for safety Postural control: Right lateral lean, Posterior lean Standing balance support: Bilateral upper extremity supported, During functional activity Standing balance-Leahy Scale: Poor Standing balance comment: UE support on stedy                            Cognition Arousal/Alertness: Awake/alert Behavior During Therapy: Flat affect Overall Cognitive Status: Difficult to assess Area of Impairment: Attention, Following commands, Problem solving                   Current Attention Level: Sustained   Following Commands: Follows one step commands inconsistently, Follows one step commands with increased time Safety/Judgement: Decreased awareness of safety Awareness: Intellectual Problem Solving: Slow processing, Decreased initiation, Difficulty sequencing, Requires verbal cues, Requires tactile cues General Comments: pt with flat affect throughout session, requires mulitmodal cueing, repetition, gestures and hand over hand for initation of tasks. Able to repeat completing after demonstrating (esp with ADL tasks ) at sink.        Exercises Other Exercises Other Exercises: balance in stedy at sink, functional reaching x 3 with L UE to R side to place items on shelf-- mod assist to maintain balance with R lateral lean    General Comments        Pertinent Vitals/Pain Pain Assessment Pain Assessment: Faces Faces Pain Scale: No hurt Pain Intervention(s): Monitored during session    Home Living                          Prior Function            PT Goals (current goals can now be found in the care plan section) Acute Rehab PT Goals Patient  Stated Goal: did not state PT Goal Formulation: With patient Time For Goal Achievement: 10/11/21 Potential to Achieve Goals: Fair Progress towards PT goals: Progressing toward goals    Frequency    Min 2X/week      PT Plan Frequency needs to be updated    Co-evaluation PT/OT/SLP Co-Evaluation/Treatment: Yes Reason for Co-Treatment: Necessary to address cognition/behavior during functional activity;For patient/therapist safety;To address functional/ADL transfers PT goals addressed during session: Mobility/safety with mobility;Balance;Proper use of DME;Strengthening/ROM OT goals addressed during session: ADL's and self-care      AM-PAC PT "6 Clicks" Mobility   Outcome Measure  Help needed turning from your back to your side while in a flat bed without using bedrails?: A Lot Help needed moving from lying on your back to sitting on the side of a flat bed without using bedrails?: Total Help needed moving to and from a bed to a chair (including a wheelchair)?: Total Help needed standing up from a chair using your arms (e.g., wheelchair or bedside chair)?: Total Help needed to walk in hospital room?: Total Help needed climbing 3-5 steps with a railing? : Total 6 Click Score: 7    End of Session Equipment Utilized During Treatment: Gait belt Activity Tolerance: Patient tolerated treatment well Patient left: in chair;with call bell/phone within reach;with chair alarm set Nurse Communication: Need for lift equipment;Mobility status PT Visit Diagnosis: Other abnormalities of gait and mobility (R26.89);Other symptoms and signs involving the nervous system (R29.898);Hemiplegia and  hemiparesis Hemiplegia - Right/Left: Right Hemiplegia - dominant/non-dominant: Dominant Hemiplegia - caused by: Cerebral infarction     Time: 1610-9604 PT Time Calculation (min) (ACUTE ONLY): 32 min  Charges:  $Therapeutic Activity: 8-22 mins                     Rolinda Roan, PT, DPT Acute  Rehabilitation Services Secure Chat Preferred Office: [phone]    Thelma Comp 10/08/2021, 12:19 PM

## 2021-10-08 NOTE — Progress Notes (Signed)
STROKE TEAM PROGRESS NOTE   INTERVAL HISTORY Patient is sitting up in bed.  Cortrack removed yesterday. Patient aphasia, not following commands. No family at bedside. eating 100% of meals.  WBC improving but still elevated.   Vitals:   10/07/21 2000 10/08/21 0400 10/08/21 0745 10/08/21 1142  BP: 128/63 112/64 124/78 119/85  Pulse: 89 71 71 (!) 108  Resp: '18 18 16 14  '$ Temp: 97.8 F (36.6 C) 98.2 F (36.8 C) 98.5 F (36.9 C) 98.1 F (36.7 C)  TempSrc: Oral Oral Oral Oral  SpO2: 98% 99% 99% 98%  Weight:      Height:       CBC:  Recent Labs  Lab 10/07/21 0546 10/08/21 0824  WBC 14.5* 13.5*  HGB 13.6 14.4  HCT 41.5 43.1  MCV 93.0 90.9  PLT 408* 436*    Basic Metabolic Panel:  Recent Labs  Lab 10/07/21 0546 10/08/21 0824  NA 143 141  K 3.8 3.6  CL 107 107  CO2 25 26  GLUCOSE 137* 125*  BUN 17 16  CREATININE 0.71 0.83  CALCIUM 9.3 9.4    Lipid Panel:  No results for input(s): "CHOL", "TRIG", "HDL", "CHOLHDL", "VLDL", "LDLCALC" in the last 168 hours.  HgbA1c:  No results for input(s): "HGBA1C" in the last 168 hours. Urine Drug Screen:  No results for input(s): "LABOPIA", "COCAINSCRNUR", "LABBENZ", "AMPHETMU", "THCU", "LABBARB" in the last 168 hours.  Alcohol Level  No results for input(s): "ETH" in the last 168 hours.  IMAGING past 24 hours No results found.  PHYSICAL EXAM  Physical Exam  Constitutional: Obese caucasian female in no acute distress.  More alert and awake this am. Still aphasic.   Respiratory: Respirations regular and unlabored on room air  Neuro: Sleep but arousable briefly. Global aphasia and nonverbal, not following commands.  Right facial droop. Tongue protrusion not cooperative.  Will keep left arm/leg off bed when lifted for her.  Right hemiplegia, decreased muscle tone.   Sensation, coordination and gait not tested due to aphasia.    ASSESSMENT/PLAN Ms. AFTYN NOTT is a 51 y.o. female with history of GERD,  hypertension, IBS, polycystic ovarian syndrome, arthritis, morbid obesity presenting with right sided weakness, left gaze deviation, right hemianopsia and expressive aphasia after a fall.  CT showed dense left MCA and she was taken for emergent mechanical thrombectomy. Remained intubated post procedure. Extubated 8/30. PT/OT/ST pending  Stroke:  Left MCA and b/l ACA infarcts due to L ICA, M1 and A2/3 occlusion s/p IR with rescue proximal L ICA stent, etiology likely large vessel disease form left ICA atherosclerosis, but ddx including cardioembolic source Code Stroke CT head hyperdense left MCA consistent with thrombosis. ASPECTS 10.    CTA head & neck Emergent large vessel occlusion at the left ICA origin. Short segment of intracranial ICA reconstitution with subsequent left MCA thrombosis. Atherosclerosis. Suspect moderate narrowing at the right V1 segment. S/p IR with TICI2b on left M2 and TICI2c on left A2 MRI Large evolving acute ischemic left MCA distribution infarct. Additional small bilateral ACA and/or ACA/MCA watershed distribution infarct.  MRA  Interval revascularization of previously seen left ICA/MCA occlusion. Focal flow defect at the distal cervical left ICA Carotid Doppler  Left distal ICA stent consistent with 50-75% stenosis. 2D Echo EF 65-70% Recommend 30 day monitoring as outpt to rule out afib LDL 137 HgbA1c 5.8 UDS positive for THC Hypercoagulable work up negative VTE prophylaxis - Lovenox No antithrombotic prior to admission, now on aspirin 81 mg  daily and Brilinta (ticagrelor) 90 mg bid post ICA stenting Therapy recommendations:  CIR vs. SNF Disposition:  pending  Hypertension Home meds:  hydrochlorothiazide Stable on the high end On lisinopril 10->20 Long-term BP goal normotensive  Hyperlipidemia Home meds: Zocor 10 and zetia LDL 137, goal < 70 Now on Zetia '10mg'$ , Crestor '20mg'$  Continue statin on discharge  Dysphagia Passed swallow on dysphagia 2 and honey  thick liquid -> dys 3 with honey thick->NPO given CXR concerning for aspiration -> dys 3 and honey thick. Cortrack out and eating all meals.   Leukocytosis  WBC 14.2->15.1->15.9->15.1->18-> 15.6->17.8>14.3>14.5>13.5 UA negative CXR Patchy opacities in the left-greater-than-right lung base may reflect atelectasis or infiltrate/aspiration Afebrile  LE venous doppler no DVT Continue monitoring  Tobacco abuse Current smoker Smoking cessation counseling will be provided  B12 deficiency B12 = 161 B12 supplement with B12 106mg IM x 1 Continue B12 10037m po daily  Other Stroke Risk Factors THC use, cessation education will be provided Obesity, Body mass index is 37.95 kg/m., BMI >/= 30 associated with increased stroke risk, recommend weight loss, diet and exercise as appropriate   Other Active Problems Chronic lower back pain with right sided sciatica, fibromyalgia Home meds: Tramadol, zanaflex, cyclobenzaprine, celebrex IBS- on bentyl- hold for now GERD- protonix  Lethargy - improving decrease amantadine '100mg'$  bid for arousal .    Hospital day # 13  10/08/2021 1:19 PM  Eating well, ready for d/c to rehab when bed is available.    To contact Stroke Continuity provider, please refer to [email or web address], contact General Neurology

## 2021-10-08 NOTE — Progress Notes (Signed)
   Inpatient Rehab Admissions Coordinator :  Per therapy's continued recommendations as well as TOC SW inquiry, patient was screened for CIR candidacy by Danne Baxter RN MSN.  At this time patient appears to be a potential candidate for CIR. I will place a rehab consult per protocol for full assessment. Please call me with any questions.  Danne Baxter RN MSN Admissions Coordinator [phone]

## 2021-10-09 ENCOUNTER — Inpatient Hospital Stay (HOSPITAL_COMMUNITY): Payer: Medicare HMO

## 2021-10-09 DIAGNOSIS — I63512 Cerebral infarction due to unspecified occlusion or stenosis of left middle cerebral artery: Secondary | ICD-10-CM | POA: Diagnosis not present

## 2021-10-09 LAB — CBC
HCT: 44.2 % (ref 36.0–46.0)
Hemoglobin: 14.6 g/dL (ref 12.0–15.0)
MCH: 30.6 pg (ref 26.0–34.0)
MCHC: 33 g/dL (ref 30.0–36.0)
MCV: 92.7 fL (ref 80.0–100.0)
Platelets: 404 10*3/uL — ABNORMAL HIGH (ref 150–400)
RBC: 4.77 MIL/uL (ref 3.87–5.11)
RDW: 14.3 % (ref 11.5–15.5)
WBC: 16.1 10*3/uL — ABNORMAL HIGH (ref 4.0–10.5)
nRBC: 0 % (ref 0.0–0.2)

## 2021-10-09 LAB — BASIC METABOLIC PANEL
Anion gap: 11 (ref 5–15)
BUN: 18 mg/dL (ref 6–20)
CO2: 21 mmol/L — ABNORMAL LOW (ref 22–32)
Calcium: 9.6 mg/dL (ref 8.9–10.3)
Chloride: 111 mmol/L (ref 98–111)
Creatinine, Ser: 0.81 mg/dL (ref 0.44–1.00)
GFR, Estimated: >60 mL/min (ref >60)
Glucose, Bld: 127 mg/dL — ABNORMAL HIGH (ref 70–99)
Potassium: 4 mmol/L (ref 3.5–5.1)
Sodium: 143 mmol/L (ref 135–145)

## 2021-10-09 LAB — GLUCOSE, CAPILLARY
Glucose-Capillary: 128 mg/dL — ABNORMAL HIGH (ref 70–99)
Glucose-Capillary: 141 mg/dL — ABNORMAL HIGH (ref 70–99)
Glucose-Capillary: 138 mg/dL — ABNORMAL HIGH (ref 70–99)
Glucose-Capillary: 114 mg/dL — ABNORMAL HIGH (ref 70–99)

## 2021-10-09 MED ORDER — AMANTADINE HCL 50 MG/5ML PO SOLN
50.0000 mg | Freq: Two times a day (BID) | ORAL | Status: DC
  Administered 2021-10-09 – 2021-10-10 (×4): 50 mg via ORAL
  Filled 2021-10-09 (×4): qty 5

## 2021-10-09 NOTE — Progress Notes (Signed)
STROKE TEAM PROGRESS NOTE   INTERVAL HISTORY Patient is sitting up in bed.  Still aphasia, not following commands. No family at bedside.   WBC elevated to 16. No fevers. Will get UA, CXR. Vitals:   10/09/21 0351 10/09/21 0500 10/09/21 0822 10/09/21 1125  BP: (!) 99/50  126/69 114/72  Pulse: 84  79 85  Resp: '20  16 16  '$ Temp: 98.2 F (36.8 C)  98.6 F (37 C) 98.6 F (37 C)  TempSrc: Oral  Oral Oral  SpO2: 97%  98% 96%  Weight:  112.2 kg    Height:       CBC:  Recent Labs  Lab 10/08/21 0824 10/09/21 0624  WBC 13.5* 16.1*  HGB 14.4 14.6  HCT 43.1 44.2  MCV 90.9 92.7  PLT 436* 404*    Basic Metabolic Panel:  Recent Labs  Lab 10/08/21 0824 10/09/21 0624  NA 141 143  K 3.6 4.0  CL 107 111  CO2 26 21*  GLUCOSE 125* 127*  BUN 16 18  CREATININE 0.83 0.81  CALCIUM 9.4 9.6    Lipid Panel:  No results for input(s): "CHOL", "TRIG", "HDL", "CHOLHDL", "VLDL", "LDLCALC" in the last 168 hours.  HgbA1c:  No results for input(s): "HGBA1C" in the last 168 hours. Urine Drug Screen:  No results for input(s): "LABOPIA", "COCAINSCRNUR", "LABBENZ", "AMPHETMU", "THCU", "LABBARB" in the last 168 hours.  Alcohol Level  No results for input(s): "ETH" in the last 168 hours.  IMAGING past 24 hours DG CHEST PORT 1 VIEW  Result Date: 10/09/2021 CLINICAL DATA:  Shortness of breath, code stroke EXAM: PORTABLE CHEST 1 VIEW COMPARISON:  Chest radiograph 10/04/2021 FINDINGS: The feeding tube has been removed. The cardiomediastinal silhouette is stable. Lung volumes are low. Aeration of the lung bases, projecting on the left, has improved. There is no focal consolidation or pulmonary edema. There is no new or worsening focal airspace disease. There is no pleural effusion or pneumothorax There is no acute osseous abnormality. IMPRESSION: 1. Interval removal of the feeding tube. 2. Low lung volumes but with improved aeration of the lung bases particularly on the left. No new or worsening focal  airspace disease. Electronically Signed   By: Valetta Mole M.D.   On: 10/09/2021 08:36    PHYSICAL EXAM  Physical Exam  Constitutional: Obese caucasian female in no acute distress.  More alert and awake this am. Still aphasic.   Respiratory: Respirations regular and unlabored on room air  Neuro: Sleep but arousable briefly. Global aphasia and nonverbal, not following commands.  Right facial droop. Tongue protrusion not cooperative.  Will keep left arm/leg off bed when lifted for her.  Right hemiplegia, decreased muscle tone.   Sensation, coordination and gait not tested due to aphasia.    ASSESSMENT/PLAN [name] is a 51 y.o. female with history of GERD, hypertension, IBS, polycystic ovarian syndrome, arthritis, morbid obesity presenting with right sided weakness, left gaze deviation, right hemianopsia and expressive aphasia after a fall.  CT showed dense left MCA and she was taken for emergent mechanical thrombectomy. Remained intubated post procedure. Extubated 8/30. PT/OT/ST pending  Stroke:  Left MCA and b/l ACA infarcts due to L ICA, M1 and A2/3 occlusion s/p IR with rescue proximal L ICA stent, etiology likely large vessel disease form left ICA atherosclerosis, but ddx including cardioembolic source Code Stroke CT head hyperdense left MCA consistent with thrombosis. ASPECTS 10.    CTA head & neck Emergent large vessel occlusion at the left  ICA origin. Short segment of intracranial ICA reconstitution with subsequent left MCA thrombosis. Atherosclerosis. Suspect moderate narrowing at the right V1 segment. S/p IR with TICI2b on left M2 and TICI2c on left A2 MRI Large evolving acute ischemic left MCA distribution infarct. Additional small bilateral ACA and/or ACA/MCA watershed distribution infarct.  MRA  Interval revascularization of previously seen left ICA/MCA occlusion. Focal flow defect at the distal cervical left ICA Carotid Doppler  Left distal ICA stent consistent  with 50-75% stenosis. 2D Echo EF 65-70% Recommend 30 day monitoring as outpt to rule out afib LDL 137 HgbA1c 5.8 UDS positive for THC Hypercoagulable work up negative VTE prophylaxis - Lovenox No antithrombotic prior to admission, now on aspirin 81 mg daily and Brilinta (ticagrelor) 90 mg bid post ICA stenting Therapy recommendations:  CIR vs. SNF Disposition:  pending  Hypertension Home meds:  hydrochlorothiazide Stable on the high end On lisinopril 10->20 Long-term BP goal normotensive  Hyperlipidemia Home meds: Zocor 10 and zetia LDL 137, goal < 70 Now on Zetia '10mg'$ , Crestor '20mg'$  Continue statin on discharge  Dysphagia Passed swallow on dysphagia 2 and honey thick liquid -> dys 3 with honey thick->NPO given CXR concerning for aspiration -> dys 3 and honey thick. Cortrack out and eating all meals.   Leukocytosis  WBC 14.2->15.1->15.9->15.1->18-> 15.6->17.8>14.3>14.5>13.5>16 UA repeat pending. CXR repeat today. Afebrile  LE venous doppler no DVT Continue monitoring. Clinically stable.   Tobacco abuse Current smoker Smoking cessation counseling will be provided  B12 deficiency B12 = 161 B12 supplement with B12 101mg IM x 1 Continue B12 10053m po daily  Other Stroke Risk Factors THC use, cessation education will be provided Obesity, Body mass index is 37.61 kg/m., BMI >/= 30 associated with increased stroke risk, recommend weight loss, diet and exercise as appropriate   Other Active Problems Chronic lower back pain with right sided sciatica, fibromyalgia Home meds: Tramadol, zanaflex, cyclobenzaprine, celebrex IBS- on bentyl- hold for now GERD- protonix  Lethargy - improving decrease amantadine '50mg'$  bid for arousal .    Hospital day # 14  10/09/2021 11:36 AM  Eating well, ready for d/c to rehab when bed is available.    To contact Stroke Continuity provider, please refer to [email or web address], contact General Neurology

## 2021-10-09 NOTE — TOC Progression Note (Signed)
Transition of Care [facility]) - Progression Note    Patient Details  Name: Erica Mcdaniel MRN: [id number] Date of Birth: [birth year]  Transition of Care [facility] CM/SW [place], [place] Phone Number: 10/09/2021, 4:08 PM  Clinical Narrative:   CSW updated that Erica Mcdaniel wants [name] as first choice for SNF instead. CSW contacted [name] to ask about referral, left a message. CSW received a call back from [facility] that they can accept. CSW updated son, Erica Mcdaniel, and he would like to move forward with [name]. Son also asked about insurance coverage, as he was told by other family members that her Medicare was a supplement instead of primary. CSW confirmed that patient's Medicare is primary, received insurance authorization for patient to admit to [name]. CSW to follow for admit to SNF tomorrow.    Expected Discharge Plan: [name] Barriers to Discharge: Continued Medical Work up, Ship broker  Expected Discharge Plan and Services Expected Discharge Plan: [name] Choice: [name] arrangements for the past 2 months: Single Family Home                                       Social Determinants of Health (SDOH) Interventions    Readmission Risk Interventions     No data to display

## 2021-10-09 NOTE — TOC Initial Note (Signed)
Transition of Care [facility]) - Initial/Assessment Note    Patient Details  Name: Erica Mcdaniel MRN: [id number] Date of Birth: 12-Sep-1970  Transition of Care [facility]) CM/SW Contact:    Geralynn Ochs, LCSW Phone Number: 10/09/2021, 4:06 PM  Clinical Narrative:        CSW alerted by rehab admissions that patient is not a candidate due to lack of support, will need SNF. CSW called patient's son, Rolla Plate, to discuss SNF and he is in agreement. CSW met with Rolla Plate when he arrived to the hospital to answer questions and provide bed offers. Logan asked about [facility] in [place], and CSW reached out to confirm bed availability. Rolla Plate said he would discuss with other family members and let CSW know if there were any other questions.   Expected Discharge Plan: Skilled Nursing Facility Barriers to Discharge: Continued Medical Work up, Ship broker   Patient Goals and CMS Choice Patient states their goals for this hospitalization and ongoing recovery are:: patient unable to participate in goal setting, nonverbal CMS Medicare.gov Compare Post Acute Care list provided to:: Patient Represenative (must comment) Choice offered to / list presented to : Adult Children  Expected Discharge Plan and Services Expected Discharge Plan: [place] Acute Care Choice: [place] arrangements for the past 2 months: Single Family Home                                      Prior Living Arrangements/Services Living arrangements for the past 2 months: Single Family Home Lives with:: Adult Children Patient language and need for interpreter reviewed:: No Do you feel safe going back to the place where you live?: Yes      Need for Family Participation in Patient Care: Yes (Comment) Care giver support system in place?: No (comment)   Criminal Activity/Legal Involvement Pertinent to Current Situation/Hospitalization: No - Comment as  needed  Activities of Daily Living      Permission Sought/Granted Permission sought to share information with : Facility Sport and exercise psychologist, Family Supports Permission granted to share information with : Yes, Verbal Permission Granted  Share Information with NAME: Rolla Plate  Permission granted to share info w AGENCY: SNF  Permission granted to share info w Relationship: Son     Emotional Assessment Appearance:: Appears stated age Attitude/Demeanor/Rapport: Unable to Assess Affect (typically observed): Unable to Assess   Alcohol / Substance Use: Not Applicable Psych Involvement: No (comment)  Admission diagnosis:  Acute ischemic left MCA stroke ([place]) [I63.512] Cerebrovascular accident (CVA) due to occlusion of left middle cerebral artery ([place]) [I63.512] Patient Active Problem List   Diagnosis Date Noted   Acute ischemic left MCA stroke ([place]) 09/25/2021   S/P TKR (total knee replacement) 08/30/2020   Pain in left knee 07/28/2018   Unilateral primary osteoarthritis, left knee 07/28/2018   Lateral epicondylitis of right elbow 12/08/2012   Ventral hernia 11/02/2010   Abnormal intestinal absorption  11/02/2010   Abdominal pain 11/02/2010   Fatigue 11/02/2010   Personal history of colonic polyps 11/02/2010   GERD (gastroesophageal reflux disease) 11/02/2010   GERD 08/06/2007   IBS 08/06/2007   ABDOMINAL PAIN-RUQ 08/06/2007   OBESITY 08/05/2007   ANXIETY 08/05/2007   SOMATIZATION DISORDER 08/05/2007   OVARIAN CYST 08/05/2007   PMS 08/05/2007   DYSFUNCTIONAL UTERINE BLEEDING 08/05/2007   FEMALE INFERTILITY 08/05/2007   DEGENERATIVE JOINT DISEASE 08/05/2007   OSTEOARTHRITIS,  KNEES, BILATERAL 08/05/2007   KNEE PAIN, BILATERAL 08/05/2007   DEGENERATIVE DISC DISEASE 08/05/2007   LOW BACK PAIN, CHRONIC 08/05/2007   FIBROMYALGIA 08/05/2007   INSOMNIA 08/05/2007   PAP SMEAR, ABNORMAL 08/05/2007   PCP:  Patient, No Pcp Per Pharmacy:   [facility], [place] [address] Phone: [phone] Fax: [phone]  [facility] [address] [place] [address] Phone: [phone] Fax: [facility], [place] [address] Phone: [phone] Fax: [phone]  [facility] [phone] - Monia Sabal, [place] [address] [place] [address] [place] [address] Phone: [phone] Fax: [phone]  [id number] [id number]: [phone]: [phone]    Social Determinants of Health (SDOH) Interventions    Readmission Risk Interventions     No data to display

## 2021-10-09 NOTE — Progress Notes (Signed)
Mobility Specialist: Progress Note   10/09/21 1542  Mobility  Activity Stood at bedside  Level of Assistance Minimal assist, patient does 75% or more  Assistive Device Stedy  Activity Response Tolerated well  $Mobility charge 1 Mobility   Pt received in the bed and agreeable to mobility. Pt bowel incontinent upon entering room and assisted with pericare with help from other mobility specialist. After pericare pt able to stand x3 in the [place] with minA. Pt back to bed after session with call bell at her side.   [facility] Rayvon Dakin Mobility Specialist Mobility Specialist [address]: [phone]

## 2021-10-09 NOTE — Plan of Care (Signed)
  Problem: Self-Care: Goal: Ability to communicate needs accurately will improve Outcome: Progressing   Problem: Nutrition: Goal: Risk of aspiration will decrease Outcome: Progressing   Problem: Ischemic Stroke/TIA Tissue Perfusion: Goal: Complications of ischemic stroke/TIA will be minimized Outcome: Progressing   Problem: Nutrition: Goal: Adequate nutrition will be maintained Outcome: Progressing

## 2021-10-10 DIAGNOSIS — E538 Deficiency of other specified B group vitamins: Secondary | ICD-10-CM | POA: Diagnosis present

## 2021-10-10 DIAGNOSIS — D72829 Elevated white blood cell count, unspecified: Secondary | ICD-10-CM

## 2021-10-10 DIAGNOSIS — E785 Hyperlipidemia, unspecified: Secondary | ICD-10-CM | POA: Diagnosis present

## 2021-10-10 DIAGNOSIS — I63512 Cerebral infarction due to unspecified occlusion or stenosis of left middle cerebral artery: Secondary | ICD-10-CM | POA: Diagnosis not present

## 2021-10-10 DIAGNOSIS — R1310 Dysphagia, unspecified: Secondary | ICD-10-CM

## 2021-10-10 DIAGNOSIS — I10 Essential (primary) hypertension: Secondary | ICD-10-CM | POA: Diagnosis present

## 2021-10-10 DIAGNOSIS — Z720 Tobacco use: Secondary | ICD-10-CM | POA: Diagnosis present

## 2021-10-10 LAB — GLUCOSE, CAPILLARY
Glucose-Capillary: 105 mg/dL — ABNORMAL HIGH (ref 70–99)
Glucose-Capillary: 101 mg/dL — ABNORMAL HIGH (ref 70–99)
Glucose-Capillary: 164 mg/dL — ABNORMAL HIGH (ref 70–99)

## 2021-10-10 MED ORDER — ADULT MULTIVITAMIN W/MINERALS CH: 1.0000 | ORAL_TABLET | Freq: Every day | ORAL | 0 refills | Status: DC

## 2021-10-10 MED ORDER — ALBUTEROL SULFATE (2.5 MG/3ML) 0.083% IN NEBU: 2.5000 mg | INHALATION_SOLUTION | RESPIRATORY_TRACT | 12 refills | Status: DC | PRN

## 2021-10-10 MED ORDER — AMANTADINE HCL 50 MG/5ML PO SOLN: 50.0000 mg | Freq: Two times a day (BID) | ORAL | 0 refills | Status: DC

## 2021-10-10 MED ORDER — ASPIRIN 81 MG PO CHEW: 81.0000 mg | CHEWABLE_TABLET | Freq: Every day | ORAL | 0 refills | Status: AC

## 2021-10-10 MED ORDER — TICAGRELOR 90 MG PO TABS: 90.0000 mg | ORAL_TABLET | Freq: Two times a day (BID) | ORAL | 0 refills | Status: DC

## 2021-10-10 MED ORDER — CYANOCOBALAMIN 500 MCG PO TABS: 500.0000 ug | ORAL_TABLET | Freq: Every day | ORAL | 0 refills | Status: DC

## 2021-10-10 MED ORDER — ROSUVASTATIN CALCIUM 20 MG PO TABS: 20.0000 mg | ORAL_TABLET | Freq: Every day | ORAL | 0 refills | Status: DC

## 2021-10-10 MED ORDER — LISINOPRIL 20 MG PO TABS: 20.0000 mg | ORAL_TABLET | Freq: Every day | ORAL | 0 refills | Status: DC

## 2021-10-10 MED ORDER — QUETIAPINE FUMARATE 25 MG PO TABS: 25.0000 mg | ORAL_TABLET | Freq: Two times a day (BID) | ORAL | 0 refills | Status: DC

## 2021-10-10 NOTE — Progress Notes (Signed)
Occupational Therapy Treatment Patient Details Name: Erica Mcdaniel MRN: [id number] DOB: May 10, 1970 Today's Date: 10/10/2021   History of present illness Pt is a 51 y.o. female who presented 09/25/21 with R-sided weakness, aphasia, L gaze deviation, and R hemianopsia. Pt outside window for TNKase. MRI revealed large acute ischemic left MCA distribution infarct, small volume acute ischemic right ACA/MCA watershed distribution infarct, and small soft tissue contusion at the right frontotemporal scalp. S/p cerebral angiogram 8/29. ETT 8/29-8/30. PMH: GERD, HTN, IBS, polycistic ovarian syndrome, arthritis, morbid obesity, cancer, fibromyalgia, anxiety   OT comments  Patient continues to make steady progress towards goals in skilled OT session. Patient's session encompassed application of RUE resting hand splint. Thumb modified to prevent a prolonged stretch on R thumb, with no discomfort noted with R thumb positioning. Splint assessed fully to have good fit and function, with no potential areas of breakdown noted. OT will continue to follow acutely.    Recommendations for follow up therapy are one component of a multi-disciplinary discharge planning process, led by the attending physician.  Recommendations may be updated based on patient status, additional functional criteria and insurance authorization.    Follow Up Recommendations  Skilled nursing-short term rehab (<3 hours/day) (AIR denied)    Assistance Recommended at Discharge Frequent or constant Supervision/Assistance  Patient can return home with the following  A lot of help with walking and/or transfers;Two people to help with walking and/or transfers;A lot of help with bathing/dressing/bathroom;Two people to help with bathing/dressing/bathroom;Assistance with cooking/housework;Assistance with feeding;Direct supervision/assist for medications management;Assist for transportation;Direct supervision/assist for financial management;Help with  stairs or ramp for entrance   Equipment Recommendations  Other (comment) (defer to next venue)    Recommendations for Other Services      Precautions / Restrictions Precautions Precautions: Fall Precaution Comments: R hemiparesis Restrictions Weight Bearing Restrictions: No       Mobility Bed Mobility                    Transfers                         Balance                                           ADL either performed or assessed with clinical judgement   ADL Overall ADL's : Needs assistance/impaired                         Toilet Transfer: Minimal assistance;+2 for safety/equipment Toilet Transfer Details (indicate cue type and reason): simulated using stedy         Functional mobility during ADLs: Minimal assistance;+2 for safety/equipment General ADL Comments: session focus on RUE resting hand splint application    Extremity/Trunk Assessment Upper Extremity Assessment Upper Extremity Assessment: RUE deficits/detail RUE Deficits / Details: pt UE trending towards flexor synergy, tone noted in elbow flexors, wrist flexors and hand. RUE Sensation: decreased light touch;decreased proprioception RUE Coordination: decreased fine motor;decreased gross motor            Vision       Perception     Praxis      Cognition Arousal/Alertness: Awake/alert Behavior During Therapy: Flat affect Overall Cognitive Status: Difficult to assess  General Comments: pt able to nod yes/no multiple times during session (unsure of accuracy), patient able to give thumbs up twice with modeling from OT        Exercises Other Exercises Other Exercises: RUE hand splint donned with thumb piece modified to prevent prolonged stretch on thumb, good fit and function noted    Shoulder Instructions       General Comments PROM to R UE from shoulder to hand, including scapular  mobilizations.  Passive prolonged stretch into elbow extension and supination, then wrist extension.  Ordered resting hand splint to promote decreased risk of contractures.    Pertinent Vitals/ Pain       Pain Assessment Pain Assessment: Faces Faces Pain Scale: Hurts a little bit Pain Location: RUE tone minimal grimacing when attempting to release tone in fingers Pain Descriptors / Indicators: Grimacing Pain Intervention(s): Limited activity within patient's tolerance, Monitored during session  Home Living                                          Prior Functioning/Environment              Frequency  Min 2X/week        Progress Toward Goals  OT Goals(current goals can now be found in the care plan section)  Progress towards OT goals: Progressing toward goals  Acute Rehab OT Goals Patient Stated Goal: unable to state OT Goal Formulation: Patient unable to participate in goal setting Time For Goal Achievement: 10/24/21 Potential to Achieve Goals: Good ADL Goals Pt Will Perform Grooming: with mod assist;sitting Pt Will Perform Upper Body Dressing: with max assist;sitting Pt Will Transfer to Toilet: with max assist;with +2 assist;stand pivot transfer;bedside commode Additional ADL Goal #1: Pt will locate 3 grooming items on R side with moderate cues. Additional ADL Goal #2: Pt will maintain dynamic sitting balance with supervision for 5 minutes. Additional ADL Goal #3: Pt will tolerate resting hand splint to R UE for nighttime use.  Plan Frequency remains appropriate;Discharge plan remains appropriate    Co-evaluation                 AM-PAC OT "6 Clicks" Daily Activity     Outcome Measure   Help from another person eating meals?: A Lot Help from another person taking care of personal grooming?: A Lot Help from another person toileting, which includes using toliet, bedpan, or urinal?: Total Help from another person bathing (including washing,  rinsing, drying)?: A Lot Help from another person to put on and taking off regular upper body clothing?: Total Help from another person to put on and taking off regular lower body clothing?: Total 6 Click Score: 9    End of Session Equipment Utilized During Treatment: Other (comment) (RUE resting hand splint)  OT Visit Diagnosis: Unsteadiness on feet (R26.81);Other abnormalities of gait and mobility (R26.89);Repeated falls (R29.6);History of falling (Z91.81);Pain   Activity Tolerance Patient tolerated treatment well   Patient Left in bed;with call bell/phone within reach;with bed alarm set   Nurse Communication Mobility status        Time: 6761-9509 OT Time Calculation (min): 11 min  Charges: OT General Charges $OT Visit: 1 Visit OT Treatments $Orthotics Fit/Training: 8-22 mins  Corinne Ports E. Anselmo Reihl, OTR/L Acute Rehabilitation Services [phone]   Ascencion Dike 10/10/2021, 2:13 PM

## 2021-10-10 NOTE — Progress Notes (Signed)
[facility] called re: ok for pt departure, pt left on stretcher w/ transport

## 2021-10-10 NOTE — Progress Notes (Signed)
Orthopedic Tech Progress Note Patient Details:  Erica Mcdaniel 1970/07/31 [id number]  Called in order to [facility] for a RESTING HAND SPLINT   Patient ID: [name], female   DOB: 11-Jul-1970, 51 y.o.   MRN: [id number]  Janit Pagan 10/10/2021, 11:51 AM

## 2021-10-10 NOTE — Care Management Important Message (Signed)
Important Message  Patient Details  Name: Erica Mcdaniel MRN: [id number] Date of Birth: 03-14-1970   Medicare Important Message Given:  Yes     [name] 10/10/2021, 1:58 PM

## 2021-10-10 NOTE — Progress Notes (Signed)
Mobility Specialist: Progress Note   10/10/21 1116  Mobility  Activity Stood at bedside  Level of Assistance Maximum assist, patient does 25-49%  Assistive Device Front wheel walker;Stedy  Activity Response Tolerated well  $Mobility charge 1 Mobility   Pt received in bed and agreeable to mobility. Pt bowel incontinent upon entering room and assisted with pericare. Pt maxA with bed mobility. Able to stand x2 with RW with heavy modA. Stood a third time with the [place] with help from OT. Pt back in bed with OT present in the room.   [facility] Jenin Birdsall Mobility Specialist Mobility Specialist [address]: [phone]

## 2021-10-10 NOTE — TOC Transition Note (Signed)
Transition of Care [facility]) - CM/SW Discharge Note   Patient Details  Name: CARLETTE PALMATIER MRN: [id number] Date of Birth: Feb 16, 1970  [facility] of [facility]) CM/SW Contact:  Geralynn Ochs, LCSW Phone Number: 10/10/2021, 3:00 PM   Clinical Narrative:   CSW confirmed insurance authorization was received, and patient medically stable to discharge to SNF today. CSW sent discharge information to [facility], they can accept. Transport scheduled with [facility] for next available. Patient's son, Rolla Plate, in agreement.  Nurse to call report to [phone].    Final next level of care: Skilled Nursing Facility Barriers to Discharge: Barriers Resolved   Patient Goals and CMS Choice Patient states their goals for this hospitalization and ongoing recovery are:: patient unable to participate in goal setting, nonverbal CMS Medicare.gov Compare Post Acute Care list provided to:: Patient Represenative (must comment) Choice offered to / list presented to : Adult Children  Discharge Placement              Patient [facility]: [facility] Patient to be transferred to facility by: [facility] Name of family member notified: Logan Patient and family notified of of transfer: 10/10/21  Discharge Plan and Services     Post Acute Care Choice: [place]                               Social Determinants of Health (SDOH) Interventions     Readmission Risk Interventions     No data to display

## 2021-10-10 NOTE — Progress Notes (Signed)
Called report to [facility], report given to [facility] who will be receiving the patient.  Discharge packet prepared per Benjamine Mola, Marlinda Mike.

## 2021-10-10 NOTE — Discharge Summary (Signed)
Stroke Discharge Summary  Patient ID: Erica Mcdaniel   MRN: [id number]      DOB: March 20, 1970  Date of Admission: 09/25/2021 Date of Discharge: 10/10/2021  Attending Physician:  Stroke, Md, MD, Stroke MD Patient's PCP:  Patient, No Pcp Per  DISCHARGE DIAGNOSIS:  Principal Problem:   Acute ischemic left MCA stroke [facility]) Active Problems:   OBESITY   GERD   hyperlipidemia   Dysphagia   Leukocytosis   Tobacco abuse   Vitamin B12 deficiency   Allergies as of 10/10/2021       Reactions   Clarithromycin Hives, Rash        Medication List     STOP taking these medications    busPIRone 15 MG tablet Commonly known as: BUSPAR   celecoxib 200 MG capsule Commonly known as: CELEBREX   cetirizine 10 MG tablet Commonly known as: ZYRTEC   cyclobenzaprine 10 MG tablet Commonly known as: FLEXERIL   dicyclomine 10 MG capsule Commonly known as: BENTYL   DULoxetine 60 MG capsule Commonly known as: CYMBALTA   hydrochlorothiazide 25 MG tablet Commonly known as: HYDRODIURIL   simvastatin 10 MG tablet Commonly known as: ZOCOR   traMADol 50 MG tablet Commonly known as: ULTRAM   Vitamin D (Ergocalciferol) 1.25 MG (50000 UNIT) Caps capsule Commonly known as: DRISDOL       TAKE these medications    albuterol (2.5 MG/3ML) 0.083% nebulizer solution Commonly known as: PROVENTIL Take 3 mLs (2.5 mg total) by nebulization every 4 (four) hours as needed for wheezing or shortness of breath.   amantadine 50 MG/5ML solution Commonly known as: SYMMETREL Take 5 mLs (50 mg total) by mouth 2 (two) times daily.   aspirin 81 MG chewable tablet Place 1 tablet (81 mg total) into feeding tube daily. Start taking on: October 11, 2021   cyanocobalamin 500 MCG tablet Commonly known as: VITAMIN B12 Place 1 tablet (500 mcg total) into feeding tube daily. Start taking on: October 11, 2021 What changed:  medication strength how much to take how to take this   esomeprazole  40 MG capsule Commonly known as: NEXIUM Take 1 capsule (40 mg total) by mouth 2 (two) times daily before a meal. What changed: when to take this   ezetimibe 10 MG tablet Commonly known as: ZETIA Take 10 mg by mouth daily.   lisinopril 20 MG tablet Commonly known as: ZESTRIL Place 1 tablet (20 mg total) into feeding tube daily. Start taking on: October 11, 2021 What changed:  medication strength how much to take how to take this   multivitamin with minerals Tabs tablet Place 1 tablet into feeding tube daily. Start taking on: October 11, 2021 What changed: how to take this   QUEtiapine 25 MG tablet Commonly known as: SEROQUEL Place 1 tablet (25 mg total) into feeding tube 2 (two) times daily.   rosuvastatin 20 MG tablet Commonly known as: CRESTOR Place 1 tablet (20 mg total) into feeding tube daily. Start taking on: October 11, 2021   ticagrelor 90 MG Tabs tablet Commonly known as: BRILINTA Place 1 tablet (90 mg total) into feeding tube 2 (two) times daily.        LABORATORY STUDIES CBC    Component Value Date/Time   WBC 16.1 (H) 10/09/2021 0624   RBC 4.77 10/09/2021 0624   HGB 14.6 10/09/2021 0624   HCT 44.2 10/09/2021 0624   PLT 404 (H) 10/09/2021 0624   MCV 92.7 10/09/2021 0624   MCH  30.6 10/09/2021 0624   MCHC 33.0 10/09/2021 0624   RDW 14.3 10/09/2021 0624   LYMPHSABS 5.5 (H) 09/25/2021 0505   MONOABS 0.9 09/25/2021 0505   EOSABS 0.9 (H) 09/25/2021 0505   BASOSABS 0.1 09/25/2021 0505   CMP    Component Value Date/Time   NA 143 10/09/2021 0624   K 4.0 10/09/2021 0624   CL 111 10/09/2021 0624   CO2 21 (L) 10/09/2021 0624   GLUCOSE 127 (H) 10/09/2021 0624   BUN 18 10/09/2021 0624   CREATININE 0.81 10/09/2021 0624   CALCIUM 9.6 10/09/2021 0624   PROT 6.6 09/25/2021 0505   ALBUMIN 3.8 09/25/2021 0505   AST 39 09/25/2021 0505   ALT 31 09/25/2021 0505   ALKPHOS 73 09/25/2021 0505   BILITOT 0.3 09/25/2021 0505   GFRNONAA >60 10/09/2021 0624    GFRAA >90 12/01/2012 1425   COAGS Lab Results  Component Value Date   INR 0.9 09/25/2021   INR 1.0 11/22/2007   Lipid Panel    Component Value Date/Time   CHOL 233 (H) 09/27/2021 0543   TRIG 243 (H) 09/27/2021 0543   HDL 45 09/27/2021 0543   CHOLHDL 5.2 09/27/2021 0543   VLDL 49 (H) 09/27/2021 0543   LDLCALC 139 (H) 09/27/2021 0543   HgbA1C  Lab Results  Component Value Date   HGBA1C 5.8 (H) 09/25/2021   Urinalysis    Component Value Date/Time   COLORURINE YELLOW 09/30/2021 1806   APPEARANCEUR CLOUDY (A) 09/30/2021 1806   LABSPEC 1.014 09/30/2021 1806   PHURINE 7.0 09/30/2021 1806   GLUCOSEU NEGATIVE 09/30/2021 1806   HGBUR NEGATIVE 09/30/2021 1806   BILIRUBINUR NEGATIVE 09/30/2021 1806   KETONESUR NEGATIVE 09/30/2021 1806   PROTEINUR NEGATIVE 09/30/2021 1806   UROBILINOGEN 0.2 12/01/2012 1421   NITRITE POSITIVE (A) 09/30/2021 1806   LEUKOCYTESUR NEGATIVE 09/30/2021 1806   Urine Drug Screen     Component Value Date/Time   LABOPIA NONE DETECTED 09/25/2021 1130   COCAINSCRNUR NONE DETECTED 09/25/2021 1130   LABBENZ NONE DETECTED 09/25/2021 1130   AMPHETMU NONE DETECTED 09/25/2021 1130   THCU POSITIVE (A) 09/25/2021 1130   LABBARB NONE DETECTED 09/25/2021 1130    Alcohol Level    Component Value Date/Time   ETH <10 09/25/2021 0505     SIGNIFICANT DIAGNOSTIC STUDIES DG CHEST PORT 1 VIEW  Result Date: 10/09/2021 CLINICAL DATA:  Shortness of breath, code stroke EXAM: PORTABLE CHEST 1 VIEW COMPARISON:  Chest radiograph 10/04/2021 FINDINGS: The feeding tube has been removed. The cardiomediastinal silhouette is stable. Lung volumes are low. Aeration of the lung bases, projecting on the left, has improved. There is no focal consolidation or pulmonary edema. There is no new or worsening focal airspace disease. There is no pleural effusion or pneumothorax There is no acute osseous abnormality. IMPRESSION: 1. Interval removal of the feeding tube. 2. Low lung volumes  but with improved aeration of the lung bases particularly on the left. No new or worsening focal airspace disease. Electronically Signed   By: Valetta Mole M.D.   On: 10/09/2021 08:36   DG Swallowing Func-Speech Pathology  Result Date: 10/05/2021 Table formatting from the original result was not included. Objective Swallowing Evaluation: Type of Study: MBS-Modified Barium Swallow Study  Patient Details Name: KORRINA ZERN MRN: [id number] Date of Birth: 08/19/1970 Today's Date: 10/05/2021 Time: SLP Start Time (ACUTE ONLY): 24 -SLP Stop Time (ACUTE ONLY): 1250 SLP Time Calculation (min) (ACUTE ONLY): 20 min Past Medical History: Past Medical History: Diagnosis Date  Anxiety   Arthritis   Cancer ([place])   skin squamous cell arm  Depression   Fibromyalgia   GERD (gastroesophageal reflux disease)   Hypertension   IBS (irritable bowel syndrome)   Pneumonia   hx  Polycystic ovarian disease   Tendonitis   Tubular adenoma of colon 2018  multiple Past Surgical History: Past Surgical History: Procedure Laterality Date  APPENDECTOMY    BACK SURGERY    BACK SURGERY  2010  rods put in  IR ANGIO INTRA EXTRACRAN SEL COM CAROTID INNOMINATE UNI R MOD SED  09/25/2021  IR CT HEAD LTD  09/25/2021  IR CT HEAD LTD  09/25/2021  IR INTRAVSC STENT CERV CAROTID W/O EMB-PROT MOD SED INC ANGIO  09/25/2021  IR PERCUTANEOUS ART THROMBECTOMY/INFUSION INTRACRANIAL INC DIAG ANGIO  09/25/2021  IR US GUIDE VASC ACCESS LEFT  09/25/2021  IR US GUIDE VASC ACCESS RIGHT  09/25/2021  KNEE ARTHROSCOPY Left 13  NEUROPLASTY / TRANSPOSITION MEDIAN NERVE AT CARPAL TUNNEL BILATERAL    OVARIAN CYST SURGERY  1983  RADIOLOGY WITH ANESTHESIA N/A 09/25/2021  Procedure: IR WITH ANESTHESIA;  Surgeon: Luanne Bras, MD;  Location: [place];  Service: Radiology;  Laterality: N/A;  ROTATOR CUFF REPAIR  2003,04  right, and elbow,left  TENDON RECONSTRUCTION Right 12/08/2012  Procedure: ELBOW TENDON RECONSTRUCTION/Right Lateral Epicondylar Debridement with Repair.;  Surgeon:  Garald Balding, MD;  Location: [place];  Service: Orthopedics;  Laterality: Right;  Right Lateral Epicondylar Debridement with Repair.  TRIGGER FINGER RELEASE Right 08 HPI: Pt is a 51 y.o. female who presented 09/25/21 with R-sided weakness, aphasia, L gaze deviation, and R hemianopsia. MRI brain revealed large acute ischemic left MCA distribution infarct, small volume acute ischemic right ACA/MCA watershed distribution infarct, and small soft tissue contusion at the right frontotemporal scalp. Mechanical thrombectomy was performed with MCA TICI 2b recanalization; occlusion of L distal M2/MCA and partial occlusion of distal A2 branches of L ACA noted with TICI 2c recanalization. Stent x 2 to L carotid artery placed. ETT 8/29-8/30. PMH: GERD, HTN, IBS, polycistic ovarian syndrome, arthritis, morbid obesity, cancer, fibromyalgia, anxiety  No data recorded  Recommendations for follow up therapy are one component of a multi-disciplinary discharge planning process, led by the attending physician.  Recommendations may be updated based on patient status, additional functional criteria and insurance authorization. Assessment / Plan / Recommendation   10/05/2021   1:52 PM Clinical Impressions Clinical Impression With consideration of the pt's shoulders empeding the view of the larynx during the MBS on 9/6, today's stidy was conducted in the bariatric radiology suite. Despite pt's adequate space and repositioning from the SLP and radiology tech, pt's shoulders remained superiorly projected. The view today was therefore improved compared to the initial study, but still suboptimal. Her presentation appears similar to that of 9/6, but with some improvement in ligual movement. She presented with oropharyngeal dysphagia characterized by impaired bolus cohesion, and a pharyngeal delay. She demonstrated premature spillage to the valleculae and pyriform sinuses and piecemeal bolus propulsion. She exhibited some impulsivity during the  study and liquid boluses were consistently large. Penetration (PAS 3) was noted with individual boluses of nectar thick liquids and this progressed to PAS 5 when consecutive swallows were used. Penetration (PAS 3, 5) was noted with thin liquids via cup. Subsequent aspiration could not be ruled out due to the obscured view, but considering depth of penetration with one bolus and the pt's subsequent coughing, aspiration is suspected. Pt's use of a chin tuck with reduced bolus  sizes of thin and nectar thick liquids shows some promise, but pt was unable to consistently implement this strategy. A dysphagia 3 diet with honey thick liquids is recommended with observance of swallowing precautions. SLP will continue to follow fo dysphagia treatment. SLP Visit Diagnosis Dysphagia, oropharyngeal phase (R13.12) Impact on safety and function Mild aspiration risk     10/05/2021   1:52 PM Treatment Recommendations Treatment Recommendations Therapy as outlined in treatment plan below     10/05/2021   1:52 PM Prognosis Prognosis for Safe Diet Advancement Good Barriers to Reach Goals Severity of deficits;Language deficits   10/05/2021   1:52 PM Diet Recommendations SLP Diet Recommendations Honey thick liquids;Dysphagia 3 (Mech soft) solids Liquid Administration via Cup;No straw Medication Administration Crushed with puree Compensations Slow rate;Small sips/bites;Follow solids with liquid Postural Changes Seated upright at 90 degrees     10/05/2021   1:52 PM Other Recommendations Oral Care Recommendations Oral care BID Follow Up Recommendations Acute inpatient rehab (3hours/day) Assistance recommended at discharge Frequent or constant Supervision/Assistance Functional Status Assessment Patient has had a recent decline in their functional status and demonstrates the ability to make significant improvements in function in a reasonable and predictable amount of time.   10/05/2021   1:52 PM Frequency and Duration  Speech Therapy Frequency (ACUTE  ONLY) min 2x/week Treatment Duration 2 weeks     10/05/2021   1:52 PM Oral Phase Oral Phase Impaired Oral - Honey Cup Lingual pumping;Decreased bolus cohesion;Premature spillage;Delayed oral transit Oral - Nectar Cup Lingual pumping;Decreased bolus cohesion;Premature spillage;Delayed oral transit Oral - Nectar Straw Lingual pumping;Decreased bolus cohesion;Premature spillage;Delayed oral transit Oral - Thin Teaspoon NT Oral - Thin Cup Lingual pumping;Decreased bolus cohesion;Premature spillage;Delayed oral transit Oral - Puree Lingual pumping;Delayed oral transit Oral - Mech Soft Lingual pumping;Delayed oral transit Oral - Pill Lingual pumping;Delayed oral transit    10/05/2021   1:52 PM Pharyngeal Phase Pharyngeal Phase Impaired Pharyngeal- Honey Cup Delayed swallow initiation-vallecula Pharyngeal- Nectar Cup Delayed swallow initiation-pyriform sinuses;Penetration/Aspiration before swallow;Penetration/Aspiration during swallow Pharyngeal Material enters airway, remains ABOVE vocal cords and not ejected out;Material enters airway, CONTACTS cords and not ejected out Pharyngeal- Nectar Straw Delayed swallow initiation-pyriform sinuses;Penetration/Aspiration before swallow;Penetration/Aspiration during swallow Pharyngeal Material enters airway, remains ABOVE vocal cords and not ejected out;Material enters airway, CONTACTS cords and not ejected out Pharyngeal- Thin Teaspoon NT Pharyngeal- Thin Cup Delayed swallow initiation-pyriform sinuses;Penetration/Aspiration before swallow;Penetration/Aspiration during swallow Pharyngeal Material enters airway, remains ABOVE vocal cords and not ejected out;Material enters airway, CONTACTS cords and not ejected out;Material enters airway, passes BELOW cords and not ejected out despite cough attempt by patient Pharyngeal- Puree Delayed swallow initiation-vallecula Pharyngeal- Mechanical Soft NT Pharyngeal- Regular Delayed swallow initiation-vallecula    10/05/2021   1:52 PM Cervical  Esophageal Phase  Cervical Esophageal Phase [facility] Shanika I. Hardin Negus, [place], [place] Office number [phone] Horton Marshall 10/05/2021, 2:03 PM                     DG CHEST PORT 1 VIEW  Result Date: 10/04/2021 CLINICAL DATA:  Airway aspiration. Assess Cortrak placement, NG tube EXAM: PORTABLE CHEST 1 VIEW COMPARISON:  Radiographs September 30, 2021. FINDINGS: The heart size and mediastinal contours are within normal limits. Patchy opacities in the left-greater-than-right lung bases may reflect atelectasis or infiltrate/aspiration. Enteric feeding catheter courses below the diaphragm with tip obscured by collimation. IMPRESSION: Patchy opacities in the left-greater-than-right lung base may reflect atelectasis or infiltrate/aspiration. Enteric feeding catheter courses below the diaphragm with tip  obscured by collimation. Electronically Signed   By: Dahlia Bailiff M.D.   On: 10/04/2021 16:25   DG Abd 1 View  Result Date: 10/04/2021 CLINICAL DATA:  Cortrak placement. EXAM: ABDOMEN - 1 VIEW COMPARISON:  None Available. FINDINGS: Oral contrast material within the colon. Paucity of small bowel gas. Cortrak tip projects at the expected location of the gastric antrum/proximal duodenum within the right upper quadrant. IMPRESSION: Cortrak tip projects at the expected location of the gastric antrum/proximal duodenum within the right upper quadrant. Electronically Signed   By: Lovey Newcomer M.D.   On: 10/04/2021 16:19   VAS [place] LOWER EXTREMITY VENOUS (DVT)  Result Date: 10/03/2021  Lower Venous DVT Study Patient Name:  KRISANDRA BUENO  Date of Exam:   10/03/2021 Medical Rec #: [id number]            Accession #:    [id number] Date of Birth: 08-27-1970            Patient Gender: F Patient Age:   5 years Exam [facility]  [facility] Procedure:      VAS [place] LOWER EXTREMITY VENOUS (DVT) Referring Phys: Cornelius Moras XU  --------------------------------------------------------------------------------  Indications: Leukocytosis.  Risk Factors: None identified. Comparison Study: No prior studies. Performing Technologist: Oliver Hum RVT  Examination Guidelines: A complete evaluation includes B-mode imaging, spectral Doppler, color Doppler, and power Doppler as needed of all accessible portions of each vessel. Bilateral testing is considered an integral part of a complete examination. Limited examinations for reoccurring indications may be performed as noted. The reflux portion of the exam is performed with the patient in reverse Trendelenburg.  +---------+---------------+---------+-----------+----------+--------------+ RIGHT    CompressibilityPhasicitySpontaneityPropertiesThrombus Aging +---------+---------------+---------+-----------+----------+--------------+ CFV      Full           Yes      Yes                                 +---------+---------------+---------+-----------+----------+--------------+ SFJ      Full                                                        +---------+---------------+---------+-----------+----------+--------------+ FV Prox  Full                                                        +---------+---------------+---------+-----------+----------+--------------+ FV Mid   Full                                                        +---------+---------------+---------+-----------+----------+--------------+ FV DistalFull                                                        +---------+---------------+---------+-----------+----------+--------------+ PFV      Full                                                        +---------+---------------+---------+-----------+----------+--------------+  POP      Full           Yes      Yes                                 +---------+---------------+---------+-----------+----------+--------------+ PTV      Full                                                         +---------+---------------+---------+-----------+----------+--------------+ PERO     Full                                                        +---------+---------------+---------+-----------+----------+--------------+   +---------+---------------+---------+-----------+----------+--------------+ LEFT     CompressibilityPhasicitySpontaneityPropertiesThrombus Aging +---------+---------------+---------+-----------+----------+--------------+ CFV      Full           Yes      Yes                                 +---------+---------------+---------+-----------+----------+--------------+ SFJ      Full                                                        +---------+---------------+---------+-----------+----------+--------------+ FV Prox  Full                                                        +---------+---------------+---------+-----------+----------+--------------+ FV Mid   Full                                                        +---------+---------------+---------+-----------+----------+--------------+ FV DistalFull                                                        +---------+---------------+---------+-----------+----------+--------------+ PFV      Full                                                        +---------+---------------+---------+-----------+----------+--------------+ POP      Full           Yes      Yes                                 +---------+---------------+---------+-----------+----------+--------------+  PTV      Full                                                        +---------+---------------+---------+-----------+----------+--------------+ PERO     Full                                                        +---------+---------------+---------+-----------+----------+--------------+     Summary: RIGHT: - There is no evidence of deep vein thrombosis  in the lower extremity.  - No cystic structure found in the popliteal fossa.  LEFT: - There is no evidence of deep vein thrombosis in the lower extremity.  - No cystic structure found in the popliteal fossa.  *See table(s) above for measurements and observations. Electronically signed by Harold Barban MD on 10/03/2021 at 10:31:37 PM.    Final    DG Swallowing Func-Speech Pathology  Result Date: 10/03/2021 Table formatting from the original result was not included. Objective Swallowing Evaluation: Type of Study: MBS-Modified Barium Swallow Study  Patient Details Name: VERNETA HAMIDI MRN: [id number] Date of Birth: [birth year] Today's Date: 10/03/2021 Time: SLP Start Time (ACUTE ONLY): 17 -SLP Stop Time (ACUTE ONLY): 1330 SLP Time Calculation (min) (ACUTE ONLY): 28 min Past Medical History: Past Medical History: Diagnosis Date  Anxiety   Arthritis   Cancer ([place])   skin squamous cell arm  Depression   Fibromyalgia   GERD (gastroesophageal reflux disease)   Hypertension   IBS (irritable bowel syndrome)   Pneumonia   hx  Polycystic ovarian disease   Tendonitis   Tubular adenoma of colon 2018  multiple Past Surgical History: Past Surgical History: Procedure Laterality Date  APPENDECTOMY    BACK SURGERY    BACK SURGERY  2010  rods put in  IR ANGIO INTRA EXTRACRAN SEL COM CAROTID INNOMINATE UNI R MOD SED  09/25/2021  IR CT HEAD LTD  09/25/2021  IR CT HEAD LTD  09/25/2021  IR INTRAVSC STENT CERV CAROTID W/O EMB-PROT MOD SED INC ANGIO  09/25/2021  IR PERCUTANEOUS ART THROMBECTOMY/INFUSION INTRACRANIAL INC DIAG ANGIO  09/25/2021  IR US GUIDE VASC ACCESS LEFT  09/25/2021  IR US GUIDE VASC ACCESS RIGHT  09/25/2021  KNEE ARTHROSCOPY Left 13  NEUROPLASTY / TRANSPOSITION MEDIAN NERVE AT CARPAL TUNNEL BILATERAL    OVARIAN CYST SURGERY  1983  RADIOLOGY WITH ANESTHESIA N/A 09/25/2021  Procedure: IR WITH ANESTHESIA;  Surgeon: Luanne Bras, MD;  Location: [place];  Service: Radiology;  Laterality: N/A;  ROTATOR CUFF REPAIR  2003,04   right, and elbow,left  TENDON RECONSTRUCTION Right 12/08/2012  Procedure: ELBOW TENDON RECONSTRUCTION/Right Lateral Epicondylar Debridement with Repair.;  Surgeon: Garald Balding, MD;  Location: [place];  Service: Orthopedics;  Laterality: Right;  Right Lateral Epicondylar Debridement with Repair.  TRIGGER FINGER RELEASE Right 08 HPI: Pt is a 51 y.o. female who presented 09/25/21 with R-sided weakness, aphasia, L gaze deviation, and R hemianopsia. MRI brain revealed large acute ischemic left MCA distribution infarct, small volume acute ischemic right ACA/MCA watershed distribution infarct, and small soft tissue contusion at the right frontotemporal scalp. Mechanical thrombectomy was performed with MCA TICI 2b recanalization; occlusion of L distal  M2/MCA and partial occlusion of distal A2 branches of L ACA noted with TICI 2c recanalization. Stent x 2 to L carotid artery placed. ETT 8/29-8/30. PMH: GERD, HTN, IBS, polycistic ovarian syndrome, arthritis, morbid obesity, cancer, fibromyalgia, anxiety  No data recorded  Recommendations for follow up therapy are one component of a multi-disciplinary discharge planning process, led by the attending physician.  Recommendations may be updated based on patient status, additional functional criteria and insurance authorization. Assessment / Plan / Recommendation   10/03/2021   1:56 PM Clinical Impressions Clinical Impression Despite the attempts of the radiology tech and SLP, the view of the pt's larynx and airway was partially obscured by her left shoulder. She presented with oropharyngeal dysphagia characterized by impaired bolus cohesion, weak lingual manipulation, and a pharyngeal delay. She demonstrated premature spillage to the valleculae and pyriform sinuses with liquids. Penetration (PAS 3, 5) and trace silent aspiration (PAS 8) were noted with thin liquids. Penetration (PAS 2,3,5) was also observed with nectar thick liquids and a single instance of silent aspiration  (PAS 8) was observed with a large bolus of nectar thick liquids. Subtle throat clearing was noted following aspiration of nectar thick liquids; the effectiveness of this could not be confirmed due to the view, but SLP, considering its weakness, suspects that it was ineffective. Laryngeal invasion was improved to PAS 2,3 when a chin tuck was used with nectar thick liquids and it was inconsistently eliminated when a reduced bolus size was used. Subsequent aspiration of penetrated nectar thick liquids could not be ruled out due to the reduced view of the larynx. No instances of penetration/aspiration were noted with thin liquids via spoon or with honey thick liquids. Considering the suboptimal visualization during the study and pt's reduced laryngeal sensation, a more conservative diet of dysphagia 2 solids and honey thick liquids will be initiated at this time with potential for advancement of liquids if pt is able to observe precautions. SLP will continue to follow for treatment. SLP Visit Diagnosis Dysphagia, oropharyngeal phase (R13.12) Impact on safety and function Mild aspiration risk;Moderate aspiration risk     10/03/2021   1:56 PM Treatment Recommendations Treatment Recommendations Therapy as outlined in treatment plan below     10/03/2021   1:56 PM Prognosis Prognosis for Safe Diet Advancement Good Barriers to Reach Goals Severity of deficits;Language deficits   10/03/2021   1:56 PM Diet Recommendations SLP Diet Recommendations Dysphagia 2 (Fine chop) solids;Honey thick liquids Liquid Administration via Cup;Straw Medication Administration Crushed with puree Compensations Slow rate;Small sips/bites;Follow solids with liquid Postural Changes Seated upright at 90 degrees     10/03/2021   1:56 PM Other Recommendations Oral Care Recommendations Oral care BID Follow Up Recommendations Acute inpatient rehab (3hours/day) Assistance recommended at discharge Frequent or constant Supervision/Assistance Functional Status  Assessment Patient has had a recent decline in their functional status and demonstrates the ability to make significant improvements in function in a reasonable and predictable amount of time.   10/03/2021   1:56 PM Frequency and Duration  Speech Therapy Frequency (ACUTE ONLY) min 2x/week Treatment Duration 2 weeks     10/03/2021   1:56 PM Oral Phase Oral Phase Impaired Oral - Honey Cup Lingual pumping;Weak lingual manipulation;Decreased bolus cohesion;Premature spillage Oral - Nectar Cup Lingual pumping;Weak lingual manipulation;Decreased bolus cohesion;Premature spillage Oral - Thin Teaspoon Lingual pumping;Weak lingual manipulation;Decreased bolus cohesion;Premature spillage Oral - Thin Cup Lingual pumping;Weak lingual manipulation;Decreased bolus cohesion;Premature spillage Oral - Puree Weak lingual manipulation Oral - Mech Soft Weak lingual manipulation  Oral - Pill Lingual pumping;Weak lingual manipulation;Decreased bolus cohesion;Premature spillage    10/03/2021   1:56 PM Pharyngeal Phase Pharyngeal Phase Impaired Pharyngeal- Honey Cup Delayed swallow initiation-vallecula Pharyngeal- Nectar Cup Delayed swallow initiation-vallecula;Delayed swallow initiation-pyriform sinuses;Penetration/Aspiration during swallow;Penetration/Aspiration before swallow Pharyngeal Material enters airway, remains ABOVE vocal cords and not ejected out;Material enters airway, CONTACTS cords and not ejected out Pharyngeal- Nectar Straw Delayed swallow initiation-vallecula;Delayed swallow initiation-pyriform sinuses;Penetration/Aspiration during swallow;Penetration/Aspiration before swallow Pharyngeal Material enters airway, remains ABOVE vocal cords and not ejected out;Material enters airway, CONTACTS cords and not ejected out Pharyngeal- Thin Teaspoon Delayed swallow initiation-vallecula;Delayed swallow initiation-pyriform sinuses Pharyngeal Material does not enter airway Pharyngeal- Thin Cup Delayed swallow initiation-vallecula;Delayed  swallow initiation-pyriform sinuses;Penetration/Aspiration during swallow Pharyngeal Material enters airway, passes BELOW cords without attempt by patient to eject out (silent aspiration);Material enters airway, remains ABOVE vocal cords and not ejected out;Material enters airway, CONTACTS cords and not ejected out Pharyngeal- Puree Delayed swallow initiation-vallecula Pharyngeal- Mechanical Soft Delayed swallow initiation-vallecula    10/03/2021   1:56 PM Cervical Esophageal Phase  Cervical Esophageal Phase [facility] Shanika I. Hardin Negus, [place], [place] Office number [phone] Horton Marshall 10/03/2021, 2:18 PM                     DG CHEST PORT 1 VIEW  Result Date: 09/30/2021 CLINICAL DATA:  Leukocytosis EXAM: PORTABLE CHEST 1 VIEW COMPARISON:  September 25, 2021 FINDINGS: The feeding tube terminates below today's film. No pneumothorax. Diffuse bilateral interstitial opacities. The cardiomediastinal silhouette is stable. IMPRESSION: Diffuse interstitial opacities suggest pulmonary edema. An atypical infection could have the same appearance. Recommend clinical correlation. No focal infiltrate. Electronically Signed   By: Dorise Bullion III M.D.   On: 09/30/2021 10:29   DG Abd Portable 1V  Result Date: 09/27/2021 CLINICAL DATA:  Evaluate feeding tube placement. EXAM: PORTABLE ABDOMEN - 1 VIEW COMPARISON:  09/26/2021 FINDINGS: A feeding tube is identified with tip below the GE junction projecting over the expected location of the gastric antrum. No dilated bowel loops identified. Visualized lung bases appear clear. IMPRESSION: Feeding tube tip projects over the gastric antrum. Electronically Signed   By: Kerby Moors M.D.   On: 09/27/2021 11:13   DG Abd Portable 1V  Result Date: 09/26/2021 CLINICAL DATA:  OG tube EXAM: PORTABLE ABDOMEN - 1 VIEW COMPARISON:  Abdominal x-ray 09/26/2021 FINDINGS: The tip of the orogastric tube is at the level of the gastric antrum. No dilated bowel loops  are seen. Lumbar fusion hardware is partially visualized. IMPRESSION: Orogastric tube tip at the level of the gastric antrum. Electronically Signed   By: Ronney Asters M.D.   On: 09/26/2021 23:18   DG Abd Portable 1V  Result Date: 09/26/2021 CLINICAL DATA:  Check gastric catheter placement EXAM: PORTABLE ABDOMEN - 1 VIEW COMPARISON:  09/25/2021 FINDINGS: Gastric catheter is noted within the stomach. Proximal side port however lies in the distal esophagus. This should be advanced deeper into the stomach. IMPRESSION: Gastric catheter as described. This should be advanced deeper into the stomach. Electronically Signed   By: Inez Catalina M.D.   On: 09/26/2021 22:03   MR BRAIN WO CONTRAST  Result Date: 09/26/2021 CLINICAL DATA:  Follow-up examination for acute stroke, left ICA occlusion, status post mechanical thrombectomy. EXAM: MRI HEAD WITHOUT CONTRAST MRA HEAD WITHOUT CONTRAST TECHNIQUE: Multiplanar, multi-echo pulse sequences of the brain and surrounding structures were acquired without intravenous contrast. Angiographic images of the Circle of Willis were acquired using MRA technique without intravenous contrast.  COMPARISON:  Prior studies from 09/25/2021. FINDINGS: MRI HEAD FINDINGS Brain: Cerebral volume within normal limits. No significant cerebral white matter disease for age. Fairly sizable area of restricted diffusion involving the left frontal, parietal, and occipital lobes, consistent with acute left MCA distribution infarct. Involvement is most pronounced about the cortical gray matter, although fairly confluent involvement of the left caudate and lentiform nuclei noted as well. Additionally, patchy small volume acute right ACA and/or ACA/MCA watershed distribution infarcts seen within the contralateral right frontal lobe (series 5, image 95). Additional area of restricted diffusion involving the anterior right temporal pole noted as well (series 5, image 68). While this could reflect an additional  site of ischemia, possible cortical contusion could also be considered. No associated hemorrhage or significant regional mass effect about these infarcts. Otherwise, gray-white matter differentiation maintained. No areas of underlying chronic cortical infarction. No convincing or visible acute or chronic intracranial blood products. No mass lesion or midline shift. Ventricles normal size without hydrocephalus. No convincing extra-axial collection. Note made of a somewhat linear density overlying the right parietal convexity on coronal DWI sequence (series 7, image 47), favored to correspond with a prominent cortical vein. Pituitary gland and suprasellar region within normal limits. Vascular: Major intracranial vascular flow voids are maintained. Skull and upper cervical spine: Craniocervical junction within normal limits. Bone marrow signal intensity somewhat diffusely decreased on T1 weighted imaging, nonspecific, but most commonly related to anemia, smoking, or obesity. No focal marrow replacing lesion. Probable small soft tissue contusion at the right frontotemporal scalp (series 5, image 86). Sinuses/Orbits: Globes orbital soft tissues within normal limits. Moderate mucosal thickening present throughout the paranasal sinuses. Small bilateral mastoid effusions noted. Patient is intubated. Other: None. MRA HEAD FINDINGS Anterior circulation: Visualized distal cervical segments of the internal carotid arteries are patent with antegrade flow. Focal flow defect at the distal cervical left ICA noted (series 1, image 48). Finding could be artifactual as no discrete stenosis is seen at this location on prior arteriogram. Possible focus of residual and/or recurrent thrombus with associated moderate approximate 50% stenosis is difficult to exclude. Petrous segments patent bilaterally. Right ICA widely patent through the siphon without stenosis or other abnormality. Asymmetric irregularity seen throughout the left carotid  siphon, most pronounced on 3D time-of-flight sequence (series 1, image 86). While this finding could be atherosclerotic in nature, a degree of vasospasm could be considered given the recent intervention. No high-grade stenosis. A1 segments patent bilaterally. Normal anterior communicating artery complex. Both ACAs are widely patent to their distal aspects. M1 segments are now both patent bilaterally. No proximal MCA branch occlusion. Distal MCA branches well perfused bilaterally. Increased prominence of the distal left MCA branches as compared to the right, likely reflecting a degree of luxury reperfusion given the underlying ischemic changes and revascularization. Posterior circulation: Left vertebral artery strongly dominant and widely patent to the vertebrobasilar junction. Left PICA patent. Right vertebral artery markedly hypoplastic but patent without visible stenosis. Partially visualized right PICA patent as well. Basilar patent to its distal aspect without stenosis. Superior cerebral arteries patent bilaterally. Both PCAs primarily supplied via the basilar. PCAs widely patent to their distal aspects without proximal stenosis. Anatomic variants: As above.  No intracranial aneurysm. IMPRESSION: MRI HEAD IMPRESSION: 1. Large evolving acute ischemic left MCA distribution infarct as above. No associated hemorrhage or significant regional mass effect. 2. Additional small volume acute ischemic nonhemorrhagic right ACA and/or ACA/MCA watershed distribution infarct. 3. Small area of restricted diffusion involving the right anterior temporal  pole. While this finding may reflect an additional site of ischemia, a possible cortical contusion could also be considered given the presumed history of trauma. No associated hemorrhage. 4. Small soft tissue contusion at the right frontotemporal scalp. MRA HEAD IMPRESSION: 1. Interval revascularization of previously seen left ICA/MCA occlusion. Asymmetric prominence of distal  left MCA branches, consistent with a degree of luxury perfusion status post revascularization. 2. Asymmetric irregularity about the left carotid siphon, indeterminate. While this finding could be atherosclerotic in nature, a degree of vasospasm could also be present given the recent intervention. 3. Focal flow defect at the distal cervical left ICA as above. While this finding could be artifactual in nature as no discrete stenosis is seen at this location on prior arteriogram, a possible focus of residual and/or recurrent partially occlusive thrombus is difficult to exclude. Associated moderate approximate 50% stenosis at this level. 4. Otherwise wide patency of the major intracranial arterial vasculature. Electronically Signed   By: Jeannine Boga M.D.   On: 09/26/2021 03:12   MR ANGIO HEAD WO CONTRAST  Result Date: 09/26/2021 CLINICAL DATA:  Follow-up examination for acute stroke, left ICA occlusion, status post mechanical thrombectomy. EXAM: MRI HEAD WITHOUT CONTRAST MRA HEAD WITHOUT CONTRAST TECHNIQUE: Multiplanar, multi-echo pulse sequences of the brain and surrounding structures were acquired without intravenous contrast. Angiographic images of the Circle of Willis were acquired using MRA technique without intravenous contrast. COMPARISON:  Prior studies from 09/25/2021. FINDINGS: MRI HEAD FINDINGS Brain: Cerebral volume within normal limits. No significant cerebral white matter disease for age. Fairly sizable area of restricted diffusion involving the left frontal, parietal, and occipital lobes, consistent with acute left MCA distribution infarct. Involvement is most pronounced about the cortical gray matter, although fairly confluent involvement of the left caudate and lentiform nuclei noted as well. Additionally, patchy small volume acute right ACA and/or ACA/MCA watershed distribution infarcts seen within the contralateral right frontal lobe (series 5, image 95). Additional area of restricted  diffusion involving the anterior right temporal pole noted as well (series 5, image 68). While this could reflect an additional site of ischemia, possible cortical contusion could also be considered. No associated hemorrhage or significant regional mass effect about these infarcts. Otherwise, gray-white matter differentiation maintained. No areas of underlying chronic cortical infarction. No convincing or visible acute or chronic intracranial blood products. No mass lesion or midline shift. Ventricles normal size without hydrocephalus. No convincing extra-axial collection. Note made of a somewhat linear density overlying the right parietal convexity on coronal DWI sequence (series 7, image 47), favored to correspond with a prominent cortical vein. Pituitary gland and suprasellar region within normal limits. Vascular: Major intracranial vascular flow voids are maintained. Skull and upper cervical spine: Craniocervical junction within normal limits. Bone marrow signal intensity somewhat diffusely decreased on T1 weighted imaging, nonspecific, but most commonly related to anemia, smoking, or obesity. No focal marrow replacing lesion. Probable small soft tissue contusion at the right frontotemporal scalp (series 5, image 86). Sinuses/Orbits: Globes orbital soft tissues within normal limits. Moderate mucosal thickening present throughout the paranasal sinuses. Small bilateral mastoid effusions noted. Patient is intubated. Other: None. MRA HEAD FINDINGS Anterior circulation: Visualized distal cervical segments of the internal carotid arteries are patent with antegrade flow. Focal flow defect at the distal cervical left ICA noted (series 1, image 48). Finding could be artifactual as no discrete stenosis is seen at this location on prior arteriogram. Possible focus of residual and/or recurrent thrombus with associated moderate approximate 50% stenosis is difficult to  exclude. Petrous segments patent bilaterally. Right ICA  widely patent through the siphon without stenosis or other abnormality. Asymmetric irregularity seen throughout the left carotid siphon, most pronounced on 3D time-of-flight sequence (series 1, image 86). While this finding could be atherosclerotic in nature, a degree of vasospasm could be considered given the recent intervention. No high-grade stenosis. A1 segments patent bilaterally. Normal anterior communicating artery complex. Both ACAs are widely patent to their distal aspects. M1 segments are now both patent bilaterally. No proximal MCA branch occlusion. Distal MCA branches well perfused bilaterally. Increased prominence of the distal left MCA branches as compared to the right, likely reflecting a degree of luxury reperfusion given the underlying ischemic changes and revascularization. Posterior circulation: Left vertebral artery strongly dominant and widely patent to the vertebrobasilar junction. Left PICA patent. Right vertebral artery markedly hypoplastic but patent without visible stenosis. Partially visualized right PICA patent as well. Basilar patent to its distal aspect without stenosis. Superior cerebral arteries patent bilaterally. Both PCAs primarily supplied via the basilar. PCAs widely patent to their distal aspects without proximal stenosis. Anatomic variants: As above.  No intracranial aneurysm. IMPRESSION: MRI HEAD IMPRESSION: 1. Large evolving acute ischemic left MCA distribution infarct as above. No associated hemorrhage or significant regional mass effect. 2. Additional small volume acute ischemic nonhemorrhagic right ACA and/or ACA/MCA watershed distribution infarct. 3. Small area of restricted diffusion involving the right anterior temporal pole. While this finding may reflect an additional site of ischemia, a possible cortical contusion could also be considered given the presumed history of trauma. No associated hemorrhage. 4. Small soft tissue contusion at the right frontotemporal scalp.  MRA HEAD IMPRESSION: 1. Interval revascularization of previously seen left ICA/MCA occlusion. Asymmetric prominence of distal left MCA branches, consistent with a degree of luxury perfusion status post revascularization. 2. Asymmetric irregularity about the left carotid siphon, indeterminate. While this finding could be atherosclerotic in nature, a degree of vasospasm could also be present given the recent intervention. 3. Focal flow defect at the distal cervical left ICA as above. While this finding could be artifactual in nature as no discrete stenosis is seen at this location on prior arteriogram, a possible focus of residual and/or recurrent partially occlusive thrombus is difficult to exclude. Associated moderate approximate 50% stenosis at this level. 4. Otherwise wide patency of the major intracranial arterial vasculature. Electronically Signed   By: Jeannine Boga M.D.   On: 09/26/2021 03:12   ECHOCARDIOGRAM COMPLETE  Result Date: 09/25/2021    ECHOCARDIOGRAM REPORT   Patient Name:   ZAHNIYA ZELLARS Date of Exam: 09/25/2021 Medical Rec #:  [id number]           Height:       68.0 in Accession #:    [id number]          Weight:       273.4 lb Date of Birth:  20-Nov-1970           BSA:          2.334 m Patient Age:    31 years            BP:           122/6 mmHg Patient Gender: F                   HR:           70 bpm. Exam Location:  Inpatient Procedure: 2D Echo, Cardiac Doppler and Color Doppler Indications:    Stroke  History:        Patient has no prior history of Echocardiogram examinations.                 Risk Factors:Hypertension and Current Smoker. GERD.  Sonographer:    Clayton Lefort RDCS (AE) Referring Phys: [id number] [facility]  Sonographer Comments: Patient is obese and echo performed with patient supine and on artificial respirator. Image acquisition challenging due to patient body habitus. IMPRESSIONS  1. Left ventricular ejection fraction, by estimation, is 65 to 70%. The left  ventricle has normal function. The left ventricle has no regional wall motion abnormalities. There is mild left ventricular hypertrophy. Left ventricular diastolic parameters were normal.  2. Right ventricular systolic function is hyperdynamic. The right ventricular size is normal. Tricuspid regurgitation signal is inadequate for assessing PA pressure.  3. The mitral valve is normal in structure. No evidence of mitral valve regurgitation.  4. The aortic valve was not well visualized. There is mild calcification of the aortic valve. Aortic valve regurgitation is not visualized. Aortic valve sclerosis/calcification is present, without any evidence of aortic stenosis.  5. The inferior vena cava is normal in size with greater than 50% respiratory variability, suggesting right atrial pressure of 3 mmHg. Comparison(s): No prior Echocardiogram. FINDINGS  Left Ventricle: Left ventricular ejection fraction, by estimation, is 65 to 70%. The left ventricle has normal function. The left ventricle has no regional wall motion abnormalities. The left ventricular internal cavity size was normal in size. There is  mild left ventricular hypertrophy. Left ventricular diastolic parameters were normal. Right Ventricle: The right ventricular size is normal. No increase in right ventricular wall thickness. Right ventricular systolic function is hyperdynamic. Tricuspid regurgitation signal is inadequate for assessing PA pressure. Left Atrium: Left atrial size was normal in size. Right Atrium: Right atrial size was normal in size. Pericardium: There is no evidence of pericardial effusion. Mitral Valve: The mitral valve is normal in structure. No evidence of mitral valve regurgitation. MV peak gradient, 6.2 mmHg. The mean mitral valve gradient is 3.0 mmHg. Tricuspid Valve: The tricuspid valve is normal in structure. Tricuspid valve regurgitation is not demonstrated. No evidence of tricuspid stenosis. Aortic Valve: The aortic valve was not  well visualized. There is mild calcification of the aortic valve. Aortic valve regurgitation is not visualized. Aortic valve sclerosis/calcification is present, without any evidence of aortic stenosis. Aortic valve mean gradient measures 9.0 mmHg. Aortic valve peak gradient measures 16.5 mmHg. Aortic valve area, by VTI measures 2.33 cm. Pulmonic Valve: The pulmonic valve was normal in structure. Pulmonic valve regurgitation is not visualized. No evidence of pulmonic stenosis. Aorta: The aortic root, ascending aorta and aortic arch are all structurally normal, with no evidence of dilitation or obstruction. Venous: The inferior vena cava is normal in size with greater than 50% respiratory variability, suggesting right atrial pressure of 3 mmHg. IAS/Shunts: No atrial level shunt detected by color flow Doppler.  LEFT VENTRICLE PLAX 2D LVIDd:         4.50 cm   Diastology LVIDs:         3.00 cm   LV e' medial:    9.90 cm/s LV PW:         1.20 cm   LV E/e' medial:  11.8 LV IVS:        0.80 cm   LV e' lateral:   9.90 cm/s LVOT diam:     2.00 cm   LV E/e' lateral: 11.8 LV SV:  88 LV SV Index:   38 LVOT Area:     3.14 cm  RIGHT VENTRICLE             IVC RV Basal diam:  3.00 cm     IVC diam: 1.90 cm RV S prime:     18.10 cm/s TAPSE (M-mode): 3.0 cm LEFT ATRIUM           Index        RIGHT ATRIUM           Index LA diam:      3.00 cm 1.29 cm/m   RA Area:     11.70 cm LA Vol (A2C): 31.4 ml 13.45 ml/m  RA Volume:   25.20 ml  10.80 ml/m LA Vol (A4C): 55.2 ml 23.65 ml/m  AORTIC VALVE AV Area (Vmax):    2.27 cm AV Area (Vmean):   2.11 cm AV Area (VTI):     2.33 cm AV Vmax:           203.00 cm/s AV Vmean:          140.000 cm/s AV VTI:            0.379 m AV Peak Grad:      16.5 mmHg AV Mean Grad:      9.0 mmHg LVOT Vmax:         147.00 cm/s LVOT Vmean:        94.200 cm/s LVOT VTI:          0.281 m LVOT/AV VTI ratio: 0.74  AORTA Ao Root diam: 2.90 cm Ao Asc diam:  3.00 cm MITRAL VALVE MV Area (PHT): 2.91 cm     SHUNTS  MV Area VTI:   2.51 cm     Systemic VTI:  0.28 m MV Peak grad:  6.2 mmHg     Systemic Diam: 2.00 cm MV Mean grad:  3.0 mmHg MV Vmax:       1.25 m/s MV Vmean:      88.6 cm/s MV Decel Time: 261 msec MV E velocity: 117.00 cm/s MV A velocity: 115.00 cm/s MV E/A ratio:  1.02 Rudean Haskell MD Electronically signed by Rudean Haskell MD Signature Date/Time: 09/25/2021/6:15:04 PM    Final    VAS US CAROTID  Result Date: 09/25/2021 Carotid Arterial Duplex Study Patient Name:  MORAYO LEVEN  Date of Exam:   09/25/2021 Medical Rec #: [id number]            Accession #:    [id number] Date of Birth: January 11, 1971            Patient Gender: F Patient Age:   86 years Exam Location:  [facility] Procedure:      VAS US CAROTID Referring Phys: Janine Ores --------------------------------------------------------------------------------  Indications:       Left stent and Carotid stenosis. Risk Factors:      Hypertension. Other Factors:     Lt stent done 09/25/21. Limitations        Today's exam was limited due to patient on a ventilator and                    patient positioning. Comparison Study:  no prior Performing Technologist: Archie Patten RVS  Examination Guidelines: A complete evaluation includes B-mode imaging, spectral Doppler, color Doppler, and power Doppler as needed of all accessible portions of each vessel. Bilateral testing is considered an integral part of a complete examination. Limited examinations for reoccurring indications may be performed  as noted.  Right Carotid Findings: +----------+--------+--------+--------+------------------+--------+           PSV cm/sEDV cm/sStenosisPlaque DescriptionComments +----------+--------+--------+--------+------------------+--------+ CCA Prox  102     26              heterogenous               +----------+--------+--------+--------+------------------+--------+ CCA Distal71      22              heterogenous                +----------+--------+--------+--------+------------------+--------+ ICA Prox  85      27      1-39%   heterogenous               +----------+--------+--------+--------+------------------+--------+ ICA Distal98      38                                         +----------+--------+--------+--------+------------------+--------+ ECA       107     13                                         +----------+--------+--------+--------+------------------+--------+ +----------+--------+-------+--------+-------------------+           PSV cm/sEDV cmsDescribeArm Pressure (mmHG) +----------+--------+-------+--------+-------------------+ [id number]                                         +----------+--------+-------+--------+-------------------+ +---------+--------+--+--------+-+ VertebralPSV cm/s35EDV cm/s7 +---------+--------+--+--------+-+  Left Carotid Findings: +----------+--------+--------+--------+------------------+--------+           PSV cm/sEDV cm/sStenosisPlaque DescriptionComments +----------+--------+--------+--------+------------------+--------+ CCA Prox  84      17              heterogenous               +----------+--------+--------+--------+------------------+--------+ CCA Distal86      24              heterogenous               +----------+--------+--------+--------+------------------+--------+ ICA Distal174     54                                         +----------+--------+--------+--------+------------------+--------+ ECA       107     9                                          +----------+--------+--------+--------+------------------+--------+ +----------+--------+--------+--------+-------------------+           PSV cm/sEDV cm/sDescribeArm Pressure (mmHG) +----------+--------+--------+--------+-------------------+ Subclavian118                                         +----------+--------+--------+--------+-------------------+  +---------+--------+--+--------+--+---------+ VertebralPSV cm/s42EDV cm/s11Antegrade +---------+--------+--+--------+--+---------+  Left Stent(s): +---------------+---+--+---------------+++ Prox to Stent  81 21                +---------------+---+--+---------------+++ Proximal Stent 78 26                +---------------+---+--+---------------+++  Mid Stent      63 26                +---------------+---+--+---------------+++ Distal Stent   [id number]% stenosis +---------------+---+--+---------------+++ Distal to [id number]                +---------------+---+--+---------------+++    Summary: Right Carotid: Velocities in the right ICA are consistent with a 1-39% stenosis. Left Carotid: Left distal ICA stent consistent with 50-75% stenosis. Vertebrals: Bilateral vertebral arteries demonstrate antegrade flow. *See table(s) above for measurements and observations.  Electronically signed by Antony Contras MD on 09/25/2021 at 4:36:13 PM.    Final    IR US Guide Vasc Access Left  Result Date: 09/25/2021 INDICATION: 51 year old female with past medical history significant for GERD, hypertension, IBS, polycystic ovarian syndrome, arthritis, morbid obesity was last seen normal by her son at 2000 hours on 09/24/2021. Son reports that heard a loud third in the morning around 3:50 a.m. and went up and found the patient on the floor, she was weak in her right side and was aphasic. The patient was brought to the ER by EMS. Last known well: 2000 hours on 09/24/2021 Her NIHSS: 25. MRSA: EXAM: ULTRASOUND-GUIDED VASCULAR ACCESS DIAGNOSTIC CEREBRAL ANGIOGRAM MECHANICAL THROMBECTOMY OF THE LEFT ICA, LEFT M1/MCA, DISTAL M2/MCA OF THE SUPERIOR DIVISION, A3 SEGMENT OF LEFT ACA CAROTID STENT PLACEMENT FOR OCCLUSION OF THE LEFT CAROTID ARTERY FLAT PANEL HEAD CT X2 COMPARISON:  None Available. MEDICATIONS: Verapamil a total of 10 mg at 5 mg, and 2.5 mg increments Cangrelor 15 microgram/kg (patient  weighs 114 kg) Aspirin 81 mg and Brilinta 180 mg through the orogastric tube Integrilin 3 mg ANESTHESIA/SEDATION: General anesthesia CONTRAST:  240 mL of Omnipaque 300 FLUOROSCOPY TIME:  Radiation Exposure Index (as provided by the fluoroscopic device): 4156 mGy Kerma Fluoro time: 98 minute and 12 seconds COMPLICATIONS: None immediate. TECHNIQUE: Following a full explanation of the procedure along with the potential associated complications, an informed witnessed consent was obtained. The risks of intracranial hemorrhage of 10%, worsening neurological deficit, ventilator dependency, death and inability to revascularize were all reviewed in detail with the patient's son and family. The patient was then put under general anesthesia by the Department of Anesthesiology at [facility]. The left groin was prepped and draped in the usual sterile fashion. Ultrasound examination of the left groin was performed demonstrating widely patent left common femoral artery. Images were recorded and sent to PACs. Under ultrasound guidance and by using a micropuncture cyst, left common femoral artery was accessed 018 inch wire was advanced through the needle and the needle was exchanged for a 4 French microcatheter. Then, inner dilator was removed, 035 inch wire was introduced and 5 [place] sheath was placed. FINDINGS: Left common femoral artery is widely patent. Patent common femoral artery was recorded and sent to the PACS. PROCEDURE: Ultrasound-guided access into the left common femoral artery. IMPRESSION: Ultrasound-guided access into the left common femoral artery. Please refer to the detailed report in the previous dictation. PLAN: 1. Transfer to ICU. 2. Tab. Aspirin 81 mg and Tab. Brilinta 90 mg bid 3. BP goals of 120-140 mm Hg Electronically Signed   By: Frazier Richards M.D.   On: 09/25/2021 14:34   IR PERCUTANEOUS ART THROMBECTOMY/INFUSION INTRACRANIAL INC DIAG ANGIO  Result Date: 09/25/2021 INDICATION: 51 year old  female with past medical history significant for GERD, hypertension, IBS, polycystic ovarian syndrome, arthritis, morbid obesity was last seen normal by her son at 2000 hours on 09/24/2021. Son reports that  heard a loud third in the morning around 3:50 a.m. and went up and found the patient on the floor, she was weak in her right side and was aphasic. The patient was brought to the ER by EMS. Last known well: 2000 hours on 09/24/2021 Her NIHSS: 25. MRSA: 0 EXAM: ULTRASOUND-GUIDED VASCULAR ACCESS DIAGNOSTIC CEREBRAL ANGIOGRAM MECHANICAL THROMBECTOMY OF THE LEFT ICA, LEFT M1/MCA, DISTAL M2/MCA OF THE SUPERIOR DIVISION, A3 SEGMENT OF LEFT ACA CAROTID STENT PLACEMENT FOR OCCLUSION OF THE LEFT CAROTID ARTERY FLAT PANEL HEAD CT X2 COMPARISON:  None Available. MEDICATIONS: Verapamil a total of 10 mg at 5 mg, and 2.5 mg increments Cangrelor 15 microgram/kg (patient weighs 114 kg) Aspirin 81 mg and  Brilinta 180 mg through the orogastric tube Integrilin 3 mg Performing surgeon: Dr. Frazier Richards Assistants: Dr. Karenann Cai and Dr. Luanne Bras ANESTHESIA/SEDATION: The procedure was performed under general anesthesia. CONTRAST:  240 mL of Omnipaque 300 milligram/mL FLUOROSCOPY: Radiation Exposure Index (as provided by the fluoroscopic device): 4156 mGy Kerma Fluoro time: 98 minute and 12 seconds COMPLICATIONS: None immediate. TECHNIQUE: Informed written consent was obtained from the patient's son after a thorough discussion of the procedural risks, benefits and alternatives. All questions were addressed. Maximal Sterile Barrier Technique was utilized including caps, mask, sterile gowns, sterile gloves, sterile drape, hand hygiene and skin antiseptic. A timeout was performed prior to the initiation of the procedure. The right groin was prepped and draped in the usual sterile fashion. Using a micropuncture kit and the modified Seldinger technique, access was gained to the right common femoral artery and an 8 [place]  by 25 cm sheath was placed. Real-time ultrasound guidance was utilized for vascular access including the acquisition of a permanent ultrasound image documenting patency of the accessed vessel. Under fluoroscopy, a Zoom 88 guide catheter was navigated over a 6 [place] neuron select Berenstein catheter and a 0.035" Terumo Glidewire into the aortic arch. The catheter was placed into the left common carotid artery and biplane DSA carotid angiography was performed. Then, under roadmap guidance, the Glidewire was advanced through the segment of occlusion of the cervical ICA and was advanced into the cavernous aspect of the ICA. The neuron select catheter was advanced over the glidewire into the mid cervical ICA and the guide catheter was advanced over the neuron select catheter to place its tip at the petrous aspect of the ICA and biplane DSA cerebral angiogram was obtained. FINDINGS: 1. Normal caliber of the right common femoral artery, adequate for vascular access. 2. Occlusion of left cervical ICA at the origin. Biplane cerebral angiogram of petrous ICA injection demonstrated occlusion of proximal left M1-MCA. There are multiple non flow-limiting filling defects seen in the A2 and A3 segments of left ACA. 3. A-comm is patent with cross filling of the contralateral ACA. PROCEDURE: Using biplane roadmap, a zoom 071 aspiration catheter was navigated over an Aristotle 24 microguidewire into the cavernous segment of the left ICA. The Aristotle 24 micro guidewire and the aspiration catheter were then advanced to the level of occlusion at the left M1 MCA and connected to an aspiration pump. Continuous aspiration was performed for 2 minutes. The guide catheter was connected to a VacLok syringe. The aspiration catheter was subsequently removed under constant aspiration. The guide catheter was aspirated for debris. Then, biplane DSA cerebral angiogram was performed by injecting contrast through the guide catheter which  demonstrated complete recanalization of the inferior division of the left MCA. However, there was an occlusion of the branch  of the superior division of the left MCA seen (mid-distal M2/MCA) (TICI 2b). There was also occlusion of the distal M3 branch of the superior division of the left MCA seen as well. Then, recanalization of the M2 branch of the MCA was planned. Under roadmap guidance, and using a Zoom 55 aspiration catheter as well as the Aristotle 14 microguidewire, occluded M2 branch was selected the wire was removed and the aspiration catheter was connected to an aspiration pump. Continuous aspiration was performed for 2 minutes. The guide catheter was connected to a VacLock syringe. The aspiration catheter was subsequently removed under constant aspiration. The guide catheter was aspirated for debris. Then, biplane DSA cerebral angiogram was performed by injecting contrast through the guide catheter which demonstrated persistent occlusion of the M2 division of the left MCA. Next, zoom 55 aspiration catheter, phenom 21 microcatheter and Aristotle 14 micro guidewire were selected and under roadmap guidance, the occluded M2 branch of the superior division was selected the microwire and catheter were advanced into the M2 segment, wire was removed, Solitaire X 3 mm x 2 cm was selected and the solitaire stent was deployed spanning the M2 segment. The microcatheter was removed and the aspiration catheter was advanced to the level of the occlusion and was connected to the aspiration pump. The device was allowed to intercalated with the clot for 3 minutes. After 3 minutes, the thrombectomy device and aspiration catheter were removed under constant aspiration. The guide catheter was aspirated for debris. Then, biplane DSA cerebral angiogram was performed by injecting contrast through the guide catheter which demonstrated recanalization of the M2 division (TICI 2c results). However, there were clots seen in the A2 and  A3 segments of left ACA. Next, mechanical thrombectomy of the left A2 and A3 segments was planned. A Zoom 55, phenom 21 microcatheter and Arisotle 14 micro guidewire were selected and under roadmap guidance, microwire and microcatheter were navigated into the pericallosal branch of the left ACA. Next, microcatheter was removed. A 3 mm by 20 mm solitaire X stent was selected and was deployed spanning the A3 segment and extending into the distal A2 segment intercalated with the clot for 3 minutes. The microcatheter was removed. The zoom 55 aspiration catheter was advanced over the wire to the level of occlusion and connected to the aspiration pump. The thrombectomy device and aspiration catheter were removed under constant aspiration. The guide catheter was aspirated for debris. Then biplane DSA cerebral angiogram was performed by injecting contrast through the guide catheter which demonstrated T2C results. Next, the guide catheter was withdrawn with its distal end in the distal common carotid artery and biplane cerebral angiogram was performed demonstrating about 70% stenoses at the proximal aspect of the ICA with mild non flow-limiting dissection. At this point in time, carotid stent placement was planned. Flat panel CT of the head was performed which demonstrated no subarachnoid or intracranial bleed. There was some contrast staining seen at the head of the left caudate nucleus. At this point in time, orogastric tube was inserted and tablet aspirin 81 mg and Brilinta 180 mg was given. Cangrelor bolus infusion was given as well as per the protocol. Subsequently, a 021 Phenom microcatheter and Aristotle 14 micro guidewire were selected and were navigated into the cavernous ICA. The microwire was exchanged for an exchange length 014 zoom wire. Next, under roadmap guidance, XACT stent [id number] was selected and was deployed in the proximal-mid cervical ICA. Another XACT [id number] stent was selected and was deployed  overlapping the  previous stent and extending into the distal common carotid artery. In view of residual 50-60% in-stent stenosis, a Viatrac 5 mm x 20 mm balloon was selected and angioplasty of the in-stent stenosis was performed. Next, post angioplasty carotid and cerebral angiogram was performed. There was minimal in-stent thrombus formation seen. For this, Integrilin a total of 3 mg was injected through the guide sheath. In view of mild irregularity and spasm at the left A1 segment, right carotid and cerebral angiogram was planned. Under ultrasound guidance and using a micropuncture system, the left common femoral artery was accessed and 5 [place] by 10 cm sheath was placed. Five French vertebral artery catheter was advanced over a 035 inch guidewire and selective catheterization of the right common carotid artery and in the internal carotid artery was performed and biplane cerebral angiogram was performed. Biplane DSA angiogram demonstrated intracranial ICA, anterior and middle cerebral artery and their branches to be widely patent. There is patent A-comm seen with excellent cross filling of the contralateral ACA. Venous aspect of the cerebral angiogram is unremarkable Next, vert catheter and the guide catheter were removed. Flat panel CT of the head was performed demonstrating no intracranial bleed. Bilateral femoral angiogram was performed of the right femoral access site was closed using an 8 French Angio-Seal and left femoral access site was closed using 6 [place] Angio-Seal. As the COVID test was not performed and also because of the large body habitus and other comorbid issues, patient was kept intubated and was transferred to the ICU in stable condition. IMPRESSION: 1. Successful mechanical thrombectomy for treatment of an occlusion of the left ICA, left M1/MCA, M2/MCA (superior division), A2 and A3/left ACA with TICI 2C results. Multiple thrombectomy passes performed to achieve this results. 2. Left carotid  stents placement. 3. Right carotid and cerebral angiogram demonstrated intracranial right ICA, right anterior and middle cerebral arteries to have a normal appearance. A-comm is widely patent with excellent cross-filling of the left ACA. PLAN: 1. Transfer to ICU. 2. Tab.  Aspirin 81 mg and Tab. Brilinta 90 mg bid 3. BP goals of 120-140 mm Hg Electronically Signed   By: Frazier Richards M.D.   On: 09/25/2021 14:14   IR US Guide Vasc Access Right  Result Date: 09/25/2021 INDICATION: 51 year old female with past medical history significant for GERD, hypertension, IBS, polycystic ovarian syndrome, arthritis, morbid obesity was last seen normal by her son at 2000 hours on 09/24/2021. Son reports that heard a loud third in the morning around 3:50 a.m. and went up and found the patient on the floor, she was weak in her right side and was aphasic. The patient was brought to the ER by EMS. Last known well: 2000 hours on 09/24/2021 Her NIHSS: 25. MRSA: 0 EXAM: ULTRASOUND-GUIDED VASCULAR ACCESS DIAGNOSTIC CEREBRAL ANGIOGRAM MECHANICAL THROMBECTOMY OF THE LEFT ICA, LEFT M1/MCA, DISTAL M2/MCA OF THE SUPERIOR DIVISION, A3 SEGMENT OF LEFT ACA CAROTID STENT PLACEMENT FOR OCCLUSION OF THE LEFT CAROTID ARTERY FLAT PANEL HEAD CT X2 COMPARISON:  None Available. MEDICATIONS: Verapamil a total of 10 mg at 5 mg, and 2.5 mg increments Cangrelor 15 microgram/kg (patient weighs 114 kg) Aspirin 81 mg and  Brilinta 180 mg through the orogastric tube Integrilin 3 mg Performing surgeon: Dr. Frazier Richards Assistants: Dr. Karenann Cai and Dr. Luanne Bras ANESTHESIA/SEDATION: The procedure was performed under general anesthesia. CONTRAST:  240 mL of Omnipaque 300 milligram/mL FLUOROSCOPY: Radiation Exposure Index (as provided by the fluoroscopic device): 4156 mGy Kerma Fluoro time: 98 minute and  12 seconds COMPLICATIONS: None immediate. TECHNIQUE: Informed written consent was obtained from the patient's son after a thorough discussion  of the procedural risks, benefits and alternatives. All questions were addressed. Maximal Sterile Barrier Technique was utilized including caps, mask, sterile gowns, sterile gloves, sterile drape, hand hygiene and skin antiseptic. A timeout was performed prior to the initiation of the procedure. The right groin was prepped and draped in the usual sterile fashion. Using a micropuncture kit and the modified Seldinger technique, access was gained to the right common femoral artery and an 8 [place] by 25 cm sheath was placed. Real-time ultrasound guidance was utilized for vascular access including the acquisition of a permanent ultrasound image documenting patency of the accessed vessel. Under fluoroscopy, a Zoom 88 guide catheter was navigated over a 6 [place] neuron select Berenstein catheter and a 0.035" Terumo Glidewire into the aortic arch. The catheter was placed into the left common carotid artery and biplane DSA carotid angiography was performed. Then, under roadmap guidance, the Glidewire was advanced through the segment of occlusion of the cervical ICA and was advanced into the cavernous aspect of the ICA. The neuron select catheter was advanced over the glidewire into the mid cervical ICA and the guide catheter was advanced over the neuron select catheter to place its tip at the petrous aspect of the ICA and biplane DSA cerebral angiogram was obtained. FINDINGS: 1. Normal caliber of the right common femoral artery, adequate for vascular access. 2. Occlusion of left cervical ICA at the origin. Biplane cerebral angiogram of petrous ICA injection demonstrated occlusion of proximal left M1-MCA. There are multiple non flow-limiting filling defects seen in the A2 and A3 segments of left ACA. 3. A-comm is patent with cross filling of the contralateral ACA. PROCEDURE: Using biplane roadmap, a zoom 071 aspiration catheter was navigated over an Aristotle 24 microguidewire into the cavernous segment of the left ICA. The  Aristotle 24 micro guidewire and the aspiration catheter were then advanced to the level of occlusion at the left M1 MCA and connected to an aspiration pump. Continuous aspiration was performed for 2 minutes. The guide catheter was connected to a VacLok syringe. The aspiration catheter was subsequently removed under constant aspiration. The guide catheter was aspirated for debris. Then, biplane DSA cerebral angiogram was performed by injecting contrast through the guide catheter which demonstrated complete recanalization of the inferior division of the left MCA. However, there was an occlusion of the branch of the superior division of the left MCA seen (mid-distal M2/MCA) (TICI 2b). There was also occlusion of the distal M3 branch of the superior division of the left MCA seen as well. Then, recanalization of the M2 branch of the MCA was planned. Under roadmap guidance, and using a Zoom 55 aspiration catheter as well as the Aristotle 14 microguidewire, occluded M2 branch was selected the wire was removed and the aspiration catheter was connected to an aspiration pump. Continuous aspiration was performed for 2 minutes. The guide catheter was connected to a VacLock syringe. The aspiration catheter was subsequently removed under constant aspiration. The guide catheter was aspirated for debris. Then, biplane DSA cerebral angiogram was performed by injecting contrast through the guide catheter which demonstrated persistent occlusion of the M2 division of the left MCA. Next, zoom 55 aspiration catheter, phenom 21 microcatheter and Aristotle 14 micro guidewire were selected and under roadmap guidance, the occluded M2 branch of the superior division was selected the microwire and catheter were advanced into the M2 segment, wire was removed,  Solitaire X 3 mm x 2 cm was selected and the solitaire stent was deployed spanning the M2 segment. The microcatheter was removed and the aspiration catheter was advanced to the level of  the occlusion and was connected to the aspiration pump. The device was allowed to intercalated with the clot for 3 minutes. After 3 minutes, the thrombectomy device and aspiration catheter were removed under constant aspiration. The guide catheter was aspirated for debris. Then, biplane DSA cerebral angiogram was performed by injecting contrast through the guide catheter which demonstrated recanalization of the M2 division (TICI 2c results). However, there were clots seen in the A2 and A3 segments of left ACA. Next, mechanical thrombectomy of the left A2 and A3 segments was planned. A Zoom 55, phenom 21 microcatheter and Arisotle 14 micro guidewire were selected and under roadmap guidance, microwire and microcatheter were navigated into the pericallosal branch of the left ACA. Next, microcatheter was removed. A 3 mm by 20 mm solitaire X stent was selected and was deployed spanning the A3 segment and extending into the distal A2 segment intercalated with the clot for 3 minutes. The microcatheter was removed. The zoom 55 aspiration catheter was advanced over the wire to the level of occlusion and connected to the aspiration pump. The thrombectomy device and aspiration catheter were removed under constant aspiration. The guide catheter was aspirated for debris. Then biplane DSA cerebral angiogram was performed by injecting contrast through the guide catheter which demonstrated T2C results. Next, the guide catheter was withdrawn with its distal end in the distal common carotid artery and biplane cerebral angiogram was performed demonstrating about 70% stenoses at the proximal aspect of the ICA with mild non flow-limiting dissection. At this point in time, carotid stent placement was planned. Flat panel CT of the head was performed which demonstrated no subarachnoid or intracranial bleed. There was some contrast staining seen at the head of the left caudate nucleus. At this point in time, orogastric tube was inserted and  tablet aspirin 81 mg and Brilinta 180 mg was given. Cangrelor bolus infusion was given as well as per the protocol. Subsequently, a 021 Phenom microcatheter and Aristotle 14 micro guidewire were selected and were navigated into the cavernous ICA. The microwire was exchanged for an exchange length 014 zoom wire. Next, under roadmap guidance, XACT stent [id number] was selected and was deployed in the proximal-mid cervical ICA. Another XACT [id number] stent was selected and was deployed overlapping the previous stent and extending into the distal common carotid artery. In view of residual 50-60% in-stent stenosis, a Viatrac 5 mm x 20 mm balloon was selected and angioplasty of the in-stent stenosis was performed. Next, post angioplasty carotid and cerebral angiogram was performed. There was minimal in-stent thrombus formation seen. For this, Integrilin a total of 3 mg was injected through the guide sheath. In view of mild irregularity and spasm at the left A1 segment, right carotid and cerebral angiogram was planned. Under ultrasound guidance and using a micropuncture system, the left common femoral artery was accessed and 5 [place] by 10 cm sheath was placed. Five French vertebral artery catheter was advanced over a 035 inch guidewire and selective catheterization of the right common carotid artery and in the internal carotid artery was performed and biplane cerebral angiogram was performed. Biplane DSA angiogram demonstrated intracranial ICA, anterior and middle cerebral artery and their branches to be widely patent. There is patent A-comm seen with excellent cross filling of the contralateral ACA. Venous aspect of the cerebral angiogram  is unremarkable Next, vert catheter and the guide catheter were removed. Flat panel CT of the head was performed demonstrating no intracranial bleed. Bilateral femoral angiogram was performed of the right femoral access site was closed using an 8 French Angio-Seal and left femoral  access site was closed using 6 [place] Angio-Seal. As the COVID test was not performed and also because of the large body habitus and other comorbid issues, patient was kept intubated and was transferred to the ICU in stable condition. IMPRESSION: 1. Successful mechanical thrombectomy for treatment of an occlusion of the left ICA, left M1/MCA, M2/MCA (superior division), A2 and A3/left ACA with TICI 2C results. Multiple thrombectomy passes performed to achieve this results. 2. Left carotid stents placement. 3. Right carotid and cerebral angiogram demonstrated intracranial right ICA, right anterior and middle cerebral arteries to have a normal appearance. A-comm is widely patent with excellent cross-filling of the left ACA. PLAN: 1. Transfer to ICU. 2. Tab.  Aspirin 81 mg and Tab. Brilinta 90 mg bid 3. BP goals of 120-140 mm Hg Electronically Signed   By: Frazier Richards M.D.   On: 09/25/2021 14:14   IR CT Head Ltd  Result Date: 09/25/2021 INDICATION: 51 year old female with past medical history significant for GERD, hypertension, IBS, polycystic ovarian syndrome, arthritis, morbid obesity was last seen normal by her son at 2000 hours on 09/24/2021. Son reports that heard a loud third in the morning around 3:50 a.m. and went up and found the patient on the floor, she was weak in her right side and was aphasic. The patient was brought to the ER by EMS. Last known well: 2000 hours on 09/24/2021 Her NIHSS: 25. MRSA: 0 EXAM: ULTRASOUND-GUIDED VASCULAR ACCESS DIAGNOSTIC CEREBRAL ANGIOGRAM MECHANICAL THROMBECTOMY OF THE LEFT ICA, LEFT M1/MCA, DISTAL M2/MCA OF THE SUPERIOR DIVISION, A3 SEGMENT OF LEFT ACA CAROTID STENT PLACEMENT FOR OCCLUSION OF THE LEFT CAROTID ARTERY FLAT PANEL HEAD CT X2 COMPARISON:  None Available. MEDICATIONS: Verapamil a total of 10 mg at 5 mg, and 2.5 mg increments Cangrelor 15 microgram/kg (patient weighs 114 kg) Aspirin 81 mg and  Brilinta 180 mg through the orogastric tube Integrilin 3 mg  Performing surgeon: Dr. Frazier Richards Assistants: Dr. Karenann Cai and Dr. Luanne Bras ANESTHESIA/SEDATION: The procedure was performed under general anesthesia. CONTRAST:  240 mL of Omnipaque 300 milligram/mL FLUOROSCOPY: Radiation Exposure Index (as provided by the fluoroscopic device): 4156 mGy Kerma Fluoro time: 98 minute and 12 seconds COMPLICATIONS: None immediate. TECHNIQUE: Informed written consent was obtained from the patient's son after a thorough discussion of the procedural risks, benefits and alternatives. All questions were addressed. Maximal Sterile Barrier Technique was utilized including caps, mask, sterile gowns, sterile gloves, sterile drape, hand hygiene and skin antiseptic. A timeout was performed prior to the initiation of the procedure. The right groin was prepped and draped in the usual sterile fashion. Using a micropuncture kit and the modified Seldinger technique, access was gained to the right common femoral artery and an 8 [place] by 25 cm sheath was placed. Real-time ultrasound guidance was utilized for vascular access including the acquisition of a permanent ultrasound image documenting patency of the accessed vessel. Under fluoroscopy, a Zoom 88 guide catheter was navigated over a 6 [place] neuron select Berenstein catheter and a 0.035" Terumo Glidewire into the aortic arch. The catheter was placed into the left common carotid artery and biplane DSA carotid angiography was performed. Then, under roadmap guidance, the Glidewire was advanced through the segment of occlusion of the  cervical ICA and was advanced into the cavernous aspect of the ICA. The neuron select catheter was advanced over the glidewire into the mid cervical ICA and the guide catheter was advanced over the neuron select catheter to place its tip at the petrous aspect of the ICA and biplane DSA cerebral angiogram was obtained. FINDINGS: 1. Normal caliber of the right common femoral artery, adequate for  vascular access. 2. Occlusion of left cervical ICA at the origin. Biplane cerebral angiogram of petrous ICA injection demonstrated occlusion of proximal left M1-MCA. There are multiple non flow-limiting filling defects seen in the A2 and A3 segments of left ACA. 3. A-comm is patent with cross filling of the contralateral ACA. PROCEDURE: Using biplane roadmap, a zoom 071 aspiration catheter was navigated over an Aristotle 24 microguidewire into the cavernous segment of the left ICA. The Aristotle 24 micro guidewire and the aspiration catheter were then advanced to the level of occlusion at the left M1 MCA and connected to an aspiration pump. Continuous aspiration was performed for 2 minutes. The guide catheter was connected to a VacLok syringe. The aspiration catheter was subsequently removed under constant aspiration. The guide catheter was aspirated for debris. Then, biplane DSA cerebral angiogram was performed by injecting contrast through the guide catheter which demonstrated complete recanalization of the inferior division of the left MCA. However, there was an occlusion of the branch of the superior division of the left MCA seen (mid-distal M2/MCA) (TICI 2b). There was also occlusion of the distal M3 branch of the superior division of the left MCA seen as well. Then, recanalization of the M2 branch of the MCA was planned. Under roadmap guidance, and using a Zoom 55 aspiration catheter as well as the Aristotle 14 microguidewire, occluded M2 branch was selected the wire was removed and the aspiration catheter was connected to an aspiration pump. Continuous aspiration was performed for 2 minutes. The guide catheter was connected to a VacLock syringe. The aspiration catheter was subsequently removed under constant aspiration. The guide catheter was aspirated for debris. Then, biplane DSA cerebral angiogram was performed by injecting contrast through the guide catheter which demonstrated persistent occlusion of the  M2 division of the left MCA. Next, zoom 55 aspiration catheter, phenom 21 microcatheter and Aristotle 14 micro guidewire were selected and under roadmap guidance, the occluded M2 branch of the superior division was selected the microwire and catheter were advanced into the M2 segment, wire was removed, Solitaire X 3 mm x 2 cm was selected and the solitaire stent was deployed spanning the M2 segment. The microcatheter was removed and the aspiration catheter was advanced to the level of the occlusion and was connected to the aspiration pump. The device was allowed to intercalated with the clot for 3 minutes. After 3 minutes, the thrombectomy device and aspiration catheter were removed under constant aspiration. The guide catheter was aspirated for debris. Then, biplane DSA cerebral angiogram was performed by injecting contrast through the guide catheter which demonstrated recanalization of the M2 division (TICI 2c results). However, there were clots seen in the A2 and A3 segments of left ACA. Next, mechanical thrombectomy of the left A2 and A3 segments was planned. A Zoom 55, phenom 21 microcatheter and Arisotle 14 micro guidewire were selected and under roadmap guidance, microwire and microcatheter were navigated into the pericallosal branch of the left ACA. Next, microcatheter was removed. A 3 mm by 20 mm solitaire X stent was selected and was deployed spanning the A3 segment and extending into the  distal A2 segment intercalated with the clot for 3 minutes. The microcatheter was removed. The zoom 55 aspiration catheter was advanced over the wire to the level of occlusion and connected to the aspiration pump. The thrombectomy device and aspiration catheter were removed under constant aspiration. The guide catheter was aspirated for debris. Then biplane DSA cerebral angiogram was performed by injecting contrast through the guide catheter which demonstrated T2C results. Next, the guide catheter was withdrawn with its  distal end in the distal common carotid artery and biplane cerebral angiogram was performed demonstrating about 70% stenoses at the proximal aspect of the ICA with mild non flow-limiting dissection. At this point in time, carotid stent placement was planned. Flat panel CT of the head was performed which demonstrated no subarachnoid or intracranial bleed. There was some contrast staining seen at the head of the left caudate nucleus. At this point in time, orogastric tube was inserted and tablet aspirin 81 mg and Brilinta 180 mg was given. Cangrelor bolus infusion was given as well as per the protocol. Subsequently, a 021 Phenom microcatheter and Aristotle 14 micro guidewire were selected and were navigated into the cavernous ICA. The microwire was exchanged for an exchange length 014 zoom wire. Next, under roadmap guidance, XACT stent [id number] was selected and was deployed in the proximal-mid cervical ICA. Another XACT [id number] stent was selected and was deployed overlapping the previous stent and extending into the distal common carotid artery. In view of residual 50-60% in-stent stenosis, a Viatrac 5 mm x 20 mm balloon was selected and angioplasty of the in-stent stenosis was performed. Next, post angioplasty carotid and cerebral angiogram was performed. There was minimal in-stent thrombus formation seen. For this, Integrilin a total of 3 mg was injected through the guide sheath. In view of mild irregularity and spasm at the left A1 segment, right carotid and cerebral angiogram was planned. Under ultrasound guidance and using a micropuncture system, the left common femoral artery was accessed and 5 [place] by 10 cm sheath was placed. Five French vertebral artery catheter was advanced over a 035 inch guidewire and selective catheterization of the right common carotid artery and in the internal carotid artery was performed and biplane cerebral angiogram was performed. Biplane DSA angiogram demonstrated  intracranial ICA, anterior and middle cerebral artery and their branches to be widely patent. There is patent A-comm seen with excellent cross filling of the contralateral ACA. Venous aspect of the cerebral angiogram is unremarkable Next, vert catheter and the guide catheter were removed. Flat panel CT of the head was performed demonstrating no intracranial bleed. Bilateral femoral angiogram was performed of the right femoral access site was closed using an 8 [place] Angio-Seal and left femoral access site was closed using 6 [place] Angio-Seal. As the COVID test was not performed and also because of the large body habitus and other comorbid issues, patient was kept intubated and was transferred to the ICU in stable condition. IMPRESSION: 1. Successful mechanical thrombectomy for treatment of an occlusion of the left ICA, left M1/MCA, M2/MCA (superior division), A2 and A3/left ACA with TICI 2C results. Multiple thrombectomy passes performed to achieve this results. 2. Left carotid stents placement. 3. Right carotid and cerebral angiogram demonstrated intracranial right ICA, right anterior and middle cerebral arteries to have a normal appearance. A-comm is widely patent with excellent cross-filling of the left ACA. PLAN: 1. Transfer to ICU. 2. Tab.  Aspirin 81 mg and Tab. Brilinta 90 mg bid 3. BP goals of 120-140 mm Hg Electronically  Signed   By: Frazier Richards M.D.   On: 09/25/2021 14:14   IR CT Head Ltd  Result Date: 09/25/2021 INDICATION: 51 year old female with past medical history significant for GERD, hypertension, IBS, polycystic ovarian syndrome, arthritis, morbid obesity was last seen normal by her son at 2000 hours on 09/24/2021. Son reports that heard a loud third in the morning around 3:50 a.m. and went up and found the patient on the floor, she was weak in her right side and was aphasic. The patient was brought to the ER by EMS. Last known well: 2000 hours on 09/24/2021 Her NIHSS: 25. MRSA: 0 EXAM:  ULTRASOUND-GUIDED VASCULAR ACCESS DIAGNOSTIC CEREBRAL ANGIOGRAM MECHANICAL THROMBECTOMY OF THE LEFT ICA, LEFT M1/MCA, DISTAL M2/MCA OF THE SUPERIOR DIVISION, A3 SEGMENT OF LEFT ACA CAROTID STENT PLACEMENT FOR OCCLUSION OF THE LEFT CAROTID ARTERY FLAT PANEL HEAD CT X2 COMPARISON:  None Available. MEDICATIONS: Verapamil a total of 10 mg at 5 mg, and 2.5 mg increments Cangrelor 15 microgram/kg (patient weighs 114 kg) Aspirin 81 mg and  Brilinta 180 mg through the orogastric tube Integrilin 3 mg Performing surgeon: Dr. Frazier Richards Assistants: Dr. Karenann Cai and Dr. Luanne Bras ANESTHESIA/SEDATION: The procedure was performed under general anesthesia. CONTRAST:  240 mL of Omnipaque 300 milligram/mL FLUOROSCOPY: Radiation Exposure Index (as provided by the fluoroscopic device): 4156 mGy Kerma Fluoro time: 98 minute and 12 seconds COMPLICATIONS: None immediate. TECHNIQUE: Informed written consent was obtained from the patient's son after a thorough discussion of the procedural risks, benefits and alternatives. All questions were addressed. Maximal Sterile Barrier Technique was utilized including caps, mask, sterile gowns, sterile gloves, sterile drape, hand hygiene and skin antiseptic. A timeout was performed prior to the initiation of the procedure. The right groin was prepped and draped in the usual sterile fashion. Using a micropuncture kit and the modified Seldinger technique, access was gained to the right common femoral artery and an 8 [place] by 25 cm sheath was placed. Real-time ultrasound guidance was utilized for vascular access including the acquisition of a permanent ultrasound image documenting patency of the accessed vessel. Under fluoroscopy, a Zoom 88 guide catheter was navigated over a 6 [place] neuron select Berenstein catheter and a 0.035" Terumo Glidewire into the aortic arch. The catheter was placed into the left common carotid artery and biplane DSA carotid angiography was performed.  Then, under roadmap guidance, the Glidewire was advanced through the segment of occlusion of the cervical ICA and was advanced into the cavernous aspect of the ICA. The neuron select catheter was advanced over the glidewire into the mid cervical ICA and the guide catheter was advanced over the neuron select catheter to place its tip at the petrous aspect of the ICA and biplane DSA cerebral angiogram was obtained. FINDINGS: 1. Normal caliber of the right common femoral artery, adequate for vascular access. 2. Occlusion of left cervical ICA at the origin. Biplane cerebral angiogram of petrous ICA injection demonstrated occlusion of proximal left M1-MCA. There are multiple non flow-limiting filling defects seen in the A2 and A3 segments of left ACA. 3. A-comm is patent with cross filling of the contralateral ACA. PROCEDURE: Using biplane roadmap, a zoom 071 aspiration catheter was navigated over an Aristotle 24 microguidewire into the cavernous segment of the left ICA. The Aristotle 24 micro guidewire and the aspiration catheter were then advanced to the level of occlusion at the left M1 MCA and connected to an aspiration pump. Continuous aspiration was performed for 2 minutes. The guide catheter  was connected to a VacLok syringe. The aspiration catheter was subsequently removed under constant aspiration. The guide catheter was aspirated for debris. Then, biplane DSA cerebral angiogram was performed by injecting contrast through the guide catheter which demonstrated complete recanalization of the inferior division of the left MCA. However, there was an occlusion of the branch of the superior division of the left MCA seen (mid-distal M2/MCA) (TICI 2b). There was also occlusion of the distal M3 branch of the superior division of the left MCA seen as well. Then, recanalization of the M2 branch of the MCA was planned. Under roadmap guidance, and using a Zoom 55 aspiration catheter as well as the Aristotle 14  microguidewire, occluded M2 branch was selected the wire was removed and the aspiration catheter was connected to an aspiration pump. Continuous aspiration was performed for 2 minutes. The guide catheter was connected to a VacLock syringe. The aspiration catheter was subsequently removed under constant aspiration. The guide catheter was aspirated for debris. Then, biplane DSA cerebral angiogram was performed by injecting contrast through the guide catheter which demonstrated persistent occlusion of the M2 division of the left MCA. Next, zoom 55 aspiration catheter, phenom 21 microcatheter and Aristotle 14 micro guidewire were selected and under roadmap guidance, the occluded M2 branch of the superior division was selected the microwire and catheter were advanced into the M2 segment, wire was removed, Solitaire X 3 mm x 2 cm was selected and the solitaire stent was deployed spanning the M2 segment. The microcatheter was removed and the aspiration catheter was advanced to the level of the occlusion and was connected to the aspiration pump. The device was allowed to intercalated with the clot for 3 minutes. After 3 minutes, the thrombectomy device and aspiration catheter were removed under constant aspiration. The guide catheter was aspirated for debris. Then, biplane DSA cerebral angiogram was performed by injecting contrast through the guide catheter which demonstrated recanalization of the M2 division (TICI 2c results). However, there were clots seen in the A2 and A3 segments of left ACA. Next, mechanical thrombectomy of the left A2 and A3 segments was planned. A Zoom 55, phenom 21 microcatheter and Arisotle 14 micro guidewire were selected and under roadmap guidance, microwire and microcatheter were navigated into the pericallosal branch of the left ACA. Next, microcatheter was removed. A 3 mm by 20 mm solitaire X stent was selected and was deployed spanning the A3 segment and extending into the distal A2 segment  intercalated with the clot for 3 minutes. The microcatheter was removed. The zoom 55 aspiration catheter was advanced over the wire to the level of occlusion and connected to the aspiration pump. The thrombectomy device and aspiration catheter were removed under constant aspiration. The guide catheter was aspirated for debris. Then biplane DSA cerebral angiogram was performed by injecting contrast through the guide catheter which demonstrated T2C results. Next, the guide catheter was withdrawn with its distal end in the distal common carotid artery and biplane cerebral angiogram was performed demonstrating about 70% stenoses at the proximal aspect of the ICA with mild non flow-limiting dissection. At this point in time, carotid stent placement was planned. Flat panel CT of the head was performed which demonstrated no subarachnoid or intracranial bleed. There was some contrast staining seen at the head of the left caudate nucleus. At this point in time, orogastric tube was inserted and tablet aspirin 81 mg and Brilinta 180 mg was given. Cangrelor bolus infusion was given as well as per the protocol. Subsequently, a  021 Phenom microcatheter and Aristotle 14 micro guidewire were selected and were navigated into the cavernous ICA. The microwire was exchanged for an exchange length 014 zoom wire. Next, under roadmap guidance, XACT stent [id number] was selected and was deployed in the proximal-mid cervical ICA. Another XACT [id number] stent was selected and was deployed overlapping the previous stent and extending into the distal common carotid artery. In view of residual 50-60% in-stent stenosis, a Viatrac 5 mm x 20 mm balloon was selected and angioplasty of the in-stent stenosis was performed. Next, post angioplasty carotid and cerebral angiogram was performed. There was minimal in-stent thrombus formation seen. For this, Integrilin a total of 3 mg was injected through the guide sheath. In view of mild irregularity and  spasm at the left A1 segment, right carotid and cerebral angiogram was planned. Under ultrasound guidance and using a micropuncture system, the left common femoral artery was accessed and 5 [place] by 10 cm sheath was placed. Five French vertebral artery catheter was advanced over a 035 inch guidewire and selective catheterization of the right common carotid artery and in the internal carotid artery was performed and biplane cerebral angiogram was performed. Biplane DSA angiogram demonstrated intracranial ICA, anterior and middle cerebral artery and their branches to be widely patent. There is patent A-comm seen with excellent cross filling of the contralateral ACA. Venous aspect of the cerebral angiogram is unremarkable Next, vert catheter and the guide catheter were removed. Flat panel CT of the head was performed demonstrating no intracranial bleed. Bilateral femoral angiogram was performed of the right femoral access site was closed using an 8 [place] Angio-Seal and left femoral access site was closed using 6 [place] Angio-Seal. As the COVID test was not performed and also because of the large body habitus and other comorbid issues, patient was kept intubated and was transferred to the ICU in stable condition. IMPRESSION: 1. Successful mechanical thrombectomy for treatment of an occlusion of the left ICA, left M1/MCA, M2/MCA (superior division), A2 and A3/left ACA with TICI 2C results. Multiple thrombectomy passes performed to achieve this results. 2. Left carotid stents placement. 3. Right carotid and cerebral angiogram demonstrated intracranial right ICA, right anterior and middle cerebral arteries to have a normal appearance. A-comm is widely patent with excellent cross-filling of the left ACA. PLAN: 1. Transfer to ICU. 2. Tab.  Aspirin 81 mg and Tab. Brilinta 90 mg bid 3. BP goals of 120-140 mm Hg Electronically Signed   By: Frazier Richards M.D.   On: 09/25/2021 14:14   IR ANGIO INTRA EXTRACRAN SEL COM CAROTID  INNOMINATE UNI R MOD SED  Result Date: 09/25/2021 INDICATION: 51 year old female with past medical history significant for GERD, hypertension, IBS, polycystic ovarian syndrome, arthritis, morbid obesity was last seen normal by her son at 2000 hours on 09/24/2021. Son reports that heard a loud third in the morning around 3:50 a.m. and went up and found the patient on the floor, she was weak in her right side and was aphasic. The patient was brought to the ER by EMS. Last known well: 2000 hours on 09/24/2021 Her NIHSS: 25. MRSA: 0 EXAM: ULTRASOUND-GUIDED VASCULAR ACCESS DIAGNOSTIC CEREBRAL ANGIOGRAM MECHANICAL THROMBECTOMY OF THE LEFT ICA, LEFT M1/MCA, DISTAL M2/MCA OF THE SUPERIOR DIVISION, A3 SEGMENT OF LEFT ACA CAROTID STENT PLACEMENT FOR OCCLUSION OF THE LEFT CAROTID ARTERY FLAT PANEL HEAD CT X2 COMPARISON:  None Available. MEDICATIONS: Verapamil a total of 10 mg at 5 mg, and 2.5 mg increments Cangrelor 15 microgram/kg (patient weighs 114 kg)  Aspirin 81 mg and  Brilinta 180 mg through the orogastric tube Integrilin 3 mg Performing surgeon: Dr. Frazier Richards Assistants: Dr. Karenann Cai and Dr. Luanne Bras ANESTHESIA/SEDATION: The procedure was performed under general anesthesia. CONTRAST:  240 mL of Omnipaque 300 milligram/mL FLUOROSCOPY: Radiation Exposure Index (as provided by the fluoroscopic device): 4156 mGy Kerma Fluoro time: 98 minute and 12 seconds COMPLICATIONS: None immediate. TECHNIQUE: Informed written consent was obtained from the patient's son after a thorough discussion of the procedural risks, benefits and alternatives. All questions were addressed. Maximal Sterile Barrier Technique was utilized including caps, mask, sterile gowns, sterile gloves, sterile drape, hand hygiene and skin antiseptic. A timeout was performed prior to the initiation of the procedure. The right groin was prepped and draped in the usual sterile fashion. Using a micropuncture kit and the modified Seldinger  technique, access was gained to the right common femoral artery and an 8 [place] by 25 cm sheath was placed. Real-time ultrasound guidance was utilized for vascular access including the acquisition of a permanent ultrasound image documenting patency of the accessed vessel. Under fluoroscopy, a Zoom 88 guide catheter was navigated over a 6 [place] neuron select Berenstein catheter and a 0.035" Terumo Glidewire into the aortic arch. The catheter was placed into the left common carotid artery and biplane DSA carotid angiography was performed. Then, under roadmap guidance, the Glidewire was advanced through the segment of occlusion of the cervical ICA and was advanced into the cavernous aspect of the ICA. The neuron select catheter was advanced over the glidewire into the mid cervical ICA and the guide catheter was advanced over the neuron select catheter to place its tip at the petrous aspect of the ICA and biplane DSA cerebral angiogram was obtained. FINDINGS: 1. Normal caliber of the right common femoral artery, adequate for vascular access. 2. Occlusion of left cervical ICA at the origin. Biplane cerebral angiogram of petrous ICA injection demonstrated occlusion of proximal left M1-MCA. There are multiple non flow-limiting filling defects seen in the A2 and A3 segments of left ACA. 3. A-comm is patent with cross filling of the contralateral ACA. PROCEDURE: Using biplane roadmap, a zoom 071 aspiration catheter was navigated over an Aristotle 24 microguidewire into the cavernous segment of the left ICA. The Aristotle 24 micro guidewire and the aspiration catheter were then advanced to the level of occlusion at the left M1 MCA and connected to an aspiration pump. Continuous aspiration was performed for 2 minutes. The guide catheter was connected to a VacLok syringe. The aspiration catheter was subsequently removed under constant aspiration. The guide catheter was aspirated for debris. Then, biplane DSA cerebral angiogram  was performed by injecting contrast through the guide catheter which demonstrated complete recanalization of the inferior division of the left MCA. However, there was an occlusion of the branch of the superior division of the left MCA seen (mid-distal M2/MCA) (TICI 2b). There was also occlusion of the distal M3 branch of the superior division of the left MCA seen as well. Then, recanalization of the M2 branch of the MCA was planned. Under roadmap guidance, and using a Zoom 55 aspiration catheter as well as the Aristotle 14 microguidewire, occluded M2 branch was selected the wire was removed and the aspiration catheter was connected to an aspiration pump. Continuous aspiration was performed for 2 minutes. The guide catheter was connected to a VacLock syringe. The aspiration catheter was subsequently removed under constant aspiration. The guide catheter was aspirated for debris. Then, biplane DSA cerebral angiogram  was performed by injecting contrast through the guide catheter which demonstrated persistent occlusion of the M2 division of the left MCA. Next, zoom 55 aspiration catheter, phenom 21 microcatheter and Aristotle 14 micro guidewire were selected and under roadmap guidance, the occluded M2 branch of the superior division was selected the microwire and catheter were advanced into the M2 segment, wire was removed, Solitaire X 3 mm x 2 cm was selected and the solitaire stent was deployed spanning the M2 segment. The microcatheter was removed and the aspiration catheter was advanced to the level of the occlusion and was connected to the aspiration pump. The device was allowed to intercalated with the clot for 3 minutes. After 3 minutes, the thrombectomy device and aspiration catheter were removed under constant aspiration. The guide catheter was aspirated for debris. Then, biplane DSA cerebral angiogram was performed by injecting contrast through the guide catheter which demonstrated recanalization of the M2  division (TICI 2c results). However, there were clots seen in the A2 and A3 segments of left ACA. Next, mechanical thrombectomy of the left A2 and A3 segments was planned. A Zoom 55, phenom 21 microcatheter and Arisotle 14 micro guidewire were selected and under roadmap guidance, microwire and microcatheter were navigated into the pericallosal branch of the left ACA. Next, microcatheter was removed. A 3 mm by 20 mm solitaire X stent was selected and was deployed spanning the A3 segment and extending into the distal A2 segment intercalated with the clot for 3 minutes. The microcatheter was removed. The zoom 55 aspiration catheter was advanced over the wire to the level of occlusion and connected to the aspiration pump. The thrombectomy device and aspiration catheter were removed under constant aspiration. The guide catheter was aspirated for debris. Then biplane DSA cerebral angiogram was performed by injecting contrast through the guide catheter which demonstrated T2C results. Next, the guide catheter was withdrawn with its distal end in the distal common carotid artery and biplane cerebral angiogram was performed demonstrating about 70% stenoses at the proximal aspect of the ICA with mild non flow-limiting dissection. At this point in time, carotid stent placement was planned. Flat panel CT of the head was performed which demonstrated no subarachnoid or intracranial bleed. There was some contrast staining seen at the head of the left caudate nucleus. At this point in time, orogastric tube was inserted and tablet aspirin 81 mg and Brilinta 180 mg was given. Cangrelor bolus infusion was given as well as per the protocol. Subsequently, a 021 Phenom microcatheter and Aristotle 14 micro guidewire were selected and were navigated into the cavernous ICA. The microwire was exchanged for an exchange length 014 zoom wire. Next, under roadmap guidance, XACT stent [id number] was selected and was deployed in the proximal-mid  cervical ICA. Another XACT [id number] stent was selected and was deployed overlapping the previous stent and extending into the distal common carotid artery. In view of residual 50-60% in-stent stenosis, a Viatrac 5 mm x 20 mm balloon was selected and angioplasty of the in-stent stenosis was performed. Next, post angioplasty carotid and cerebral angiogram was performed. There was minimal in-stent thrombus formation seen. For this, Integrilin a total of 3 mg was injected through the guide sheath. In view of mild irregularity and spasm at the left A1 segment, right carotid and cerebral angiogram was planned. Under ultrasound guidance and using a micropuncture system, the left common femoral artery was accessed and 5 [place] by 10 cm sheath was placed. Five French vertebral artery catheter was advanced over a  035 inch guidewire and selective catheterization of the right common carotid artery and in the internal carotid artery was performed and biplane cerebral angiogram was performed. Biplane DSA angiogram demonstrated intracranial ICA, anterior and middle cerebral artery and their branches to be widely patent. There is patent A-comm seen with excellent cross filling of the contralateral ACA. Venous aspect of the cerebral angiogram is unremarkable Next, vert catheter and the guide catheter were removed. Flat panel CT of the head was performed demonstrating no intracranial bleed. Bilateral femoral angiogram was performed of the right femoral access site was closed using an 8 French Angio-Seal and left femoral access site was closed using 6 [place] Angio-Seal. As the COVID test was not performed and also because of the large body habitus and other comorbid issues, patient was kept intubated and was transferred to the ICU in stable condition. IMPRESSION: 1. Successful mechanical thrombectomy for treatment of an occlusion of the left ICA, left M1/MCA, M2/MCA (superior division), A2 and A3/left ACA with TICI 2C results.  Multiple thrombectomy passes performed to achieve this results. 2. Left carotid stents placement. 3. Right carotid and cerebral angiogram demonstrated intracranial right ICA, right anterior and middle cerebral arteries to have a normal appearance. A-comm is widely patent with excellent cross-filling of the left ACA. PLAN: 1. Transfer to ICU. 2. Tab.  Aspirin 81 mg and Tab. Brilinta 90 mg bid 3. BP goals of 120-140 mm Hg Electronically Signed   By: Frazier Richards M.D.   On: 09/25/2021 14:14   IR INTRAVSC STENT CERV CAROTID W/O EMB-PROT MOD SED  Result Date: 09/25/2021 INDICATION: 51 year old female with past medical history significant for GERD, hypertension, IBS, polycystic ovarian syndrome, arthritis, morbid obesity was last seen normal by her son at 2000 hours on 09/24/2021. Son reports that heard a loud third in the morning around 3:50 a.m. and went up and found the patient on the floor, she was weak in her right side and was aphasic. The patient was brought to the ER by EMS. Last known well: 2000 hours on 09/24/2021 Her NIHSS: 25. MRSA: 0 EXAM: ULTRASOUND-GUIDED VASCULAR ACCESS DIAGNOSTIC CEREBRAL ANGIOGRAM MECHANICAL THROMBECTOMY OF THE LEFT ICA, LEFT M1/MCA, DISTAL M2/MCA OF THE SUPERIOR DIVISION, A3 SEGMENT OF LEFT ACA CAROTID STENT PLACEMENT FOR OCCLUSION OF THE LEFT CAROTID ARTERY FLAT PANEL HEAD CT X2 COMPARISON:  None Available. MEDICATIONS: Verapamil a total of 10 mg at 5 mg, and 2.5 mg increments Cangrelor 15 microgram/kg (patient weighs 114 kg) Aspirin 81 mg and  Brilinta 180 mg through the orogastric tube Integrilin 3 mg Performing surgeon: Dr. Frazier Richards Assistants: Dr. Karenann Cai and Dr. Luanne Bras ANESTHESIA/SEDATION: The procedure was performed under general anesthesia. CONTRAST:  240 mL of Omnipaque 300 milligram/mL FLUOROSCOPY: Radiation Exposure Index (as provided by the fluoroscopic device): 4156 mGy Kerma Fluoro time: 98 minute and 12 seconds COMPLICATIONS: None  immediate. TECHNIQUE: Informed written consent was obtained from the patient's son after a thorough discussion of the procedural risks, benefits and alternatives. All questions were addressed. Maximal Sterile Barrier Technique was utilized including caps, mask, sterile gowns, sterile gloves, sterile drape, hand hygiene and skin antiseptic. A timeout was performed prior to the initiation of the procedure. The right groin was prepped and draped in the usual sterile fashion. Using a micropuncture kit and the modified Seldinger technique, access was gained to the right common femoral artery and an 8 [place] by 25 cm sheath was placed. Real-time ultrasound guidance was utilized for vascular access including the acquisition of  a permanent ultrasound image documenting patency of the accessed vessel. Under fluoroscopy, a Zoom 88 guide catheter was navigated over a 6 [place] neuron select Berenstein catheter and a 0.035" Terumo Glidewire into the aortic arch. The catheter was placed into the left common carotid artery and biplane DSA carotid angiography was performed. Then, under roadmap guidance, the Glidewire was advanced through the segment of occlusion of the cervical ICA and was advanced into the cavernous aspect of the ICA. The neuron select catheter was advanced over the glidewire into the mid cervical ICA and the guide catheter was advanced over the neuron select catheter to place its tip at the petrous aspect of the ICA and biplane DSA cerebral angiogram was obtained. FINDINGS: 1. Normal caliber of the right common femoral artery, adequate for vascular access. 2. Occlusion of left cervical ICA at the origin. Biplane cerebral angiogram of petrous ICA injection demonstrated occlusion of proximal left M1-MCA. There are multiple non flow-limiting filling defects seen in the A2 and A3 segments of left ACA. 3. A-comm is patent with cross filling of the contralateral ACA. PROCEDURE: Using biplane roadmap, a zoom 071  aspiration catheter was navigated over an Aristotle 24 microguidewire into the cavernous segment of the left ICA. The Aristotle 24 micro guidewire and the aspiration catheter were then advanced to the level of occlusion at the left M1 MCA and connected to an aspiration pump. Continuous aspiration was performed for 2 minutes. The guide catheter was connected to a VacLok syringe. The aspiration catheter was subsequently removed under constant aspiration. The guide catheter was aspirated for debris. Then, biplane DSA cerebral angiogram was performed by injecting contrast through the guide catheter which demonstrated complete recanalization of the inferior division of the left MCA. However, there was an occlusion of the branch of the superior division of the left MCA seen (mid-distal M2/MCA) (TICI 2b). There was also occlusion of the distal M3 branch of the superior division of the left MCA seen as well. Then, recanalization of the M2 branch of the MCA was planned. Under roadmap guidance, and using a Zoom 55 aspiration catheter as well as the Aristotle 14 microguidewire, occluded M2 branch was selected the wire was removed and the aspiration catheter was connected to an aspiration pump. Continuous aspiration was performed for 2 minutes. The guide catheter was connected to a VacLock syringe. The aspiration catheter was subsequently removed under constant aspiration. The guide catheter was aspirated for debris. Then, biplane DSA cerebral angiogram was performed by injecting contrast through the guide catheter which demonstrated persistent occlusion of the M2 division of the left MCA. Next, zoom 55 aspiration catheter, phenom 21 microcatheter and Aristotle 14 micro guidewire were selected and under roadmap guidance, the occluded M2 branch of the superior division was selected the microwire and catheter were advanced into the M2 segment, wire was removed, Solitaire X 3 mm x 2 cm was selected and the solitaire stent was  deployed spanning the M2 segment. The microcatheter was removed and the aspiration catheter was advanced to the level of the occlusion and was connected to the aspiration pump. The device was allowed to intercalated with the clot for 3 minutes. After 3 minutes, the thrombectomy device and aspiration catheter were removed under constant aspiration. The guide catheter was aspirated for debris. Then, biplane DSA cerebral angiogram was performed by injecting contrast through the guide catheter which demonstrated recanalization of the M2 division (TICI 2c results). However, there were clots seen in the A2 and A3 segments of left ACA.  Next, mechanical thrombectomy of the left A2 and A3 segments was planned. A Zoom 55, phenom 21 microcatheter and Arisotle 14 micro guidewire were selected and under roadmap guidance, microwire and microcatheter were navigated into the pericallosal branch of the left ACA. Next, microcatheter was removed. A 3 mm by 20 mm solitaire X stent was selected and was deployed spanning the A3 segment and extending into the distal A2 segment intercalated with the clot for 3 minutes. The microcatheter was removed. The zoom 55 aspiration catheter was advanced over the wire to the level of occlusion and connected to the aspiration pump. The thrombectomy device and aspiration catheter were removed under constant aspiration. The guide catheter was aspirated for debris. Then biplane DSA cerebral angiogram was performed by injecting contrast through the guide catheter which demonstrated T2C results. Next, the guide catheter was withdrawn with its distal end in the distal common carotid artery and biplane cerebral angiogram was performed demonstrating about 70% stenoses at the proximal aspect of the ICA with mild non flow-limiting dissection. At this point in time, carotid stent placement was planned. Flat panel CT of the head was performed which demonstrated no subarachnoid or intracranial bleed. There was  some contrast staining seen at the head of the left caudate nucleus. At this point in time, orogastric tube was inserted and tablet aspirin 81 mg and Brilinta 180 mg was given. Cangrelor bolus infusion was given as well as per the protocol. Subsequently, a 021 Phenom microcatheter and Aristotle 14 micro guidewire were selected and were navigated into the cavernous ICA. The microwire was exchanged for an exchange length 014 zoom wire. Next, under roadmap guidance, XACT stent [id number] was selected and was deployed in the proximal-mid cervical ICA. Another XACT [id number] stent was selected and was deployed overlapping the previous stent and extending into the distal common carotid artery. In view of residual 50-60% in-stent stenosis, a Viatrac 5 mm x 20 mm balloon was selected and angioplasty of the in-stent stenosis was performed. Next, post angioplasty carotid and cerebral angiogram was performed. There was minimal in-stent thrombus formation seen. For this, Integrilin a total of 3 mg was injected through the guide sheath. In view of mild irregularity and spasm at the left A1 segment, right carotid and cerebral angiogram was planned. Under ultrasound guidance and using a micropuncture system, the left common femoral artery was accessed and 5 [place] by 10 cm sheath was placed. Five French vertebral artery catheter was advanced over a 035 inch guidewire and selective catheterization of the right common carotid artery and in the internal carotid artery was performed and biplane cerebral angiogram was performed. Biplane DSA angiogram demonstrated intracranial ICA, anterior and middle cerebral artery and their branches to be widely patent. There is patent A-comm seen with excellent cross filling of the contralateral ACA. Venous aspect of the cerebral angiogram is unremarkable Next, vert catheter and the guide catheter were removed. Flat panel CT of the head was performed demonstrating no intracranial bleed. Bilateral  femoral angiogram was performed of the right femoral access site was closed using an 8 [place] Angio-Seal and left femoral access site was closed using 6 [place] Angio-Seal. As the COVID test was not performed and also because of the large body habitus and other comorbid issues, patient was kept intubated and was transferred to the ICU in stable condition. IMPRESSION: 1. Successful mechanical thrombectomy for treatment of an occlusion of the left ICA, left M1/MCA, M2/MCA (superior division), A2 and A3/left ACA with TICI 2C results. Multiple thrombectomy  passes performed to achieve this results. 2. Left carotid stents placement. 3. Right carotid and cerebral angiogram demonstrated intracranial right ICA, right anterior and middle cerebral arteries to have a normal appearance. A-comm is widely patent with excellent cross-filling of the left ACA. PLAN: 1. Transfer to ICU. 2. Tab.  Aspirin 81 mg and Tab. Brilinta 90 mg bid 3. BP goals of 120-140 mm Hg Electronically Signed   By: Frazier Richards M.D.   On: 09/25/2021 14:14   DG Abd Portable 1V  Result Date: 09/25/2021 CLINICAL DATA:  Insertion of endotracheal tube and orogastric tube EXAM: PORTABLE ABDOMEN - 1 VIEW COMPARISON:  Portable exam 1106 hours without priors for comparison FINDINGS: Tip of orogastric tube projects over proximal stomach. Subsegmental atelectasis LEFT lung base. Visualized bowel gas pattern normal. No osseous abnormalities. IMPRESSION: Tip of orogastric tube projects over proximal stomach. LEFT basilar atelectasis. Electronically Signed   By: Lavonia Dana M.D.   On: 09/25/2021 11:16   DG CHEST PORT 1 VIEW  Result Date: 09/25/2021 CLINICAL DATA:  Ventilator dependence. EXAM: PORTABLE CHEST 1 VIEW COMPARISON:  12/01/2012 FINDINGS: 1105 hours. Endotracheal tube tip is 4.4 cm above the base of the carina. The NG tube passes into the stomach although the distal tip position is not included on the film. Low volume film with vascular congestion.  Patchy airspace disease noted right upper lobe, left mid lung and left base. No substantial pleural effusion. Cardiopericardial silhouette is at upper limits of normal for size. Telemetry leads overlie the chest. IMPRESSION: 1. Endotracheal tube tip is 4.4 cm above the base of the carina. 2. Patchy airspace disease bilaterally compatible with asymmetric edema or multifocal pneumonia. Electronically Signed   By: Misty Stanley M.D.   On: 09/25/2021 11:16   CT ANGIO HEAD NECK W WO CM W PERF (CODE STROKE)  Result Date: 09/25/2021 CLINICAL DATA:  Right-sided weakness EXAM: CT ANGIOGRAPHY HEAD AND NECK TECHNIQUE: Multidetector CT imaging of the head and neck was performed using the standard protocol during bolus administration of intravenous contrast. Multiplanar CT image reconstructions and MIPs were obtained to evaluate the vascular anatomy. Carotid stenosis measurements (when applicable) are obtained utilizing NASCET criteria, using the distal internal carotid diameter as the denominator. RADIATION DOSE REDUCTION: This exam was performed according to the departmental dose-optimization program which includes automated exposure control, adjustment of the mA and/or kV according to patient size and/or use of iterative reconstruction technique. CONTRAST:  63m OMNIPAQUE IOHEXOL 350 MG/ML SOLN COMPARISON:  None Available. FINDINGS: CTA NECK FINDINGS Aortic arch: Normal arch.  Two vessel branching Right carotid system: Mixed density plaque mainly at the bifurcation without flow limiting stenosis or ulceration. Left carotid system: Mixed density plaque at the bifurcation. Proximal ICA occlusion. Vertebral arteries: No proximal subclavian stenosis. Suboptimal visualization of the vertebral arteries due to streak artifact and suboptimal bolus density. Suspect moderate narrowing at the right V1 segment. No beading or dissection seen. Skeleton: Ordinary cervical spine degeneration. Other neck: Subcutaneous varix in the left  upper chest. Upper chest: Ground-glass opacity and interlobular septal thickening with airway cuffing in the upper lungs. Review of the MIP images confirms the above findings CTA HEAD FINDINGS Anterior circulation: Atheromatous calcification of the carotid siphons. Left ICA reconstitution beginning at the ophthalmic segment. There is subsequent left MCA occlusion with abrupt cut off. No additional major branch occlusion is seen. Negative for aneurysm Posterior circulation: Dominant left vertebral artery. No major branch occlusion, beading, or aneurysm Venous sinuses: Patent. Small volume gas at the  left cavernous sinus usually from IV access. Anatomic variants: None significant Review of the MIP images confirms the above findings Expected findings based on prior head CT which are relayed by chat. IMPRESSION: 1. Emergent large vessel occlusion at the left ICA origin. Short segment of intracranial ICA reconstitution with subsequent left MCA thrombosis. 2. Atherosclerosis. Suspect moderate narrowing at the right V1 segment. 3. Apical pulmonary opacity favoring edema Electronically Signed   By: Jorje Guild M.D.   On: 09/25/2021 05:24   CT HEAD CODE STROKE WO CONTRAST  Result Date: 09/25/2021 CLINICAL DATA:  Code stroke.  Right-sided weakness EXAM: CT HEAD WITHOUT CONTRAST TECHNIQUE: Contiguous axial images were obtained from the base of the skull through the vertex without intravenous contrast. RADIATION DOSE REDUCTION: This exam was performed according to the departmental dose-optimization program which includes automated exposure control, adjustment of the mA and/or kV according to patient size and/or use of iterative reconstruction technique. COMPARISON:  None Available. FINDINGS: Brain: No evidence of acute infarction, hemorrhage, hydrocephalus, extra-axial collection or mass lesion/mass effect. Vascular: Dense left MCA. Skull: Normal. Negative for fracture or focal lesion. Sinuses/Orbits: Negative Other:  These results were communicated to Dr. Lorrin Goodell at 5:17 am on 09/25/2021, who is already aware. ASPECTS [facility] Stroke Program Early CT Score) - Ganglionic level infarction (caudate, lentiform nuclei, internal capsule, insula, M1-M3 cortex): 7 - Supraganglionic infarction (M4-M6 cortex): 3 Total score (0-10 with 10 being normal): 10 IMPRESSION: 1. Dense left MCA consistent with thrombosis in the setting. 2. ASPECTS is 10.  No acute hemorrhage. Electronically Signed   By: Jorje Guild M.D.   On: 09/25/2021 05:18      HISTORY OF PRESENT ILLNESS Ms. RANESSA KOSTA is a 51 y.o. female with history of GERD, hypertension, IBS, polycystic ovarian syndrome, arthritis, morbid obesity presenting with right sided weakness, left gaze deviation, right hemianopsia and expressive aphasia after a fall.  CT showed dense left MCA and she was taken for emergent mechanical thrombectomy. Remained intubated post procedure. Extubated 8/30.   HOSPITAL COURSE Left MCA and b/l ACA infarcts due to L ICA, M1 and A2/3 occlusion s/p IR with rescue proximal L ICA stent, etiology likely large vessel disease form left ICA atherosclerosis, but ddx including cardioembolic source    CT head hyperdense left MCA consistent with thrombosis. ASPECTS 10.    CTA head & neck Emergent large vessel occlusion at the left ICA origin. Short segment of intracranial ICA reconstitution with subsequent left MCA thrombosis. Atherosclerosis. Suspect moderate narrowing at the right V1 segment. S/p IR with TICI2b on left M2 and TICI2c on left A2 MRI Large evolving acute ischemic left MCA distribution infarct. Additional small bilateral ACA and/or ACA/MCA watershed distribution infarct.  MRA  Interval revascularization of previously seen left ICA/MCA occlusion. Focal flow defect at the distal cervical left ICA Carotid Doppler  Left distal ICA stent consistent with 50-75% stenosis. 2D Echo EF 65-70% UDS positive for THC LDL 137 HgbA1c 5.8 She  was started on aspirin 81 mg daily and Brilinta (ticagrelor) 90 mg bid post ICA stenting  Hypertension She was placed on Lisinopril 26m daily   Hyperlipidemia Home meds: Zocor 10 and zetia LDL 137, goal < 70 Now on Zetia 168m Crestor 2074mDysphagia Passed swallow on dysphagia 2 and honey thick liquid -> dys 3 with honey thick->NPO given CXR concerning for aspiration -> dys 3 and honey thick. Cortrack out and eating all meals  Leukocytosis  WBC 14.2->15.1->15.9->15.1->18-> 15.6->17.8>14.3>14.5>13.5>16 UA never sent  CXR no  acute process  Afebrile  LE venous doppler no DVT Continue monitoring. Clinically stable.   Tobacco abuse Current smoker Smoking cessation counseling will be provided  B12 deficiency B12 = 161 B12 supplement with B12 1046mg IM x 1 Continue B12 10057m po daily   Encephalopathy  Started on amantadine 5011mID   DISCHARGE EXAM Blood pressure (!) 114/57, pulse 79, temperature 98 F (36.7 C), resp. rate 18, height _0  (1.727 m), weight 109.7 kg, SpO2 100 %. Physical Exam  Constitutional: Obese caucasian female in no acute distress.  More alert and awake this am. Still aphasic.    Respiratory: Respirations regular and unlabored on room air   Neuro: Sleep but arousable briefly. Global aphasia and nonverbal, not following commands.  Right facial droop. Tongue protrusion not cooperative.  Will keep left arm/leg off bed when lifted for her.  Right hemiplegia, decreased muscle tone.    Sensation, coordination and gait not tested due to aphasia.  Discharge Diet       Diet   DIET DYS 3 Room service appropriate? No; Fluid consistency: Honey Thick   liquids  DISCHARGE PLAN Disposition:  SNF aspirin 81 mg daily and Brilinta (ticagrelor) 90 mg bid for secondary stroke prevention  Ongoing stroke risk factor control by Primary Care Physician at time of discharge Follow-up PCP in 2 weeks. [facility] in 8  weeks, office to schedule an appointment.   50  minutes were spent preparing discharge.  DenBeulah GandyP [id number]-AG   ATTENDING ATTESTATION:  Dr. PalReeves Forthaluated pt independently, reviewed imaging, chart, labs. Discussed and formulated plan with the Resident/APP. Changes were made to the note where appropriate. Please see APP/resident note above for details.   >50m92mn discharge planning.  Shanequa Whitenight,MD

## 2021-12-05 ENCOUNTER — Ambulatory Visit (INDEPENDENT_AMBULATORY_CARE_PROVIDER_SITE_OTHER): Payer: Medicare Other | Admitting: Diagnostic Neuroimaging

## 2021-12-05 ENCOUNTER — Encounter: Payer: Self-pay | Admitting: Diagnostic Neuroimaging

## 2021-12-05 VITALS — BP 129/85 | HR 81 | Ht 67.0 in

## 2021-12-05 DIAGNOSIS — I63413 Cerebral infarction due to embolism of bilateral middle cerebral arteries: Secondary | ICD-10-CM

## 2021-12-05 MED ORDER — BUSPIRONE HCL 15 MG PO TABS: 15.0000 mg | ORAL_TABLET | Freq: Every day | ORAL | 12 refills | Status: DC

## 2021-12-05 MED ORDER — BUSPIRONE HCL 15 MG PO TABS: 15.0000 mg | ORAL_TABLET | Freq: Three times a day (TID) | ORAL | 12 refills | Status: DC

## 2021-12-05 MED ORDER — NUEDEXTA 20-10 MG PO CAPS: 1.0000 | ORAL_CAPSULE | Freq: Two times a day (BID) | ORAL | 12 refills | Status: DC

## 2021-12-05 NOTE — Progress Notes (Signed)
[facility]  PATIENT: Erica Mcdaniel DOB: 1970/03/08  REFERRING CLINICIAN: August Albino, NP HISTORY FROM: patient  REASON FOR VISIT: new consult    HISTORICAL  CHIEF COMPLAINT:  No chief complaint on file.   HISTORY OF PRESENT ILLNESS:   51 year old female here for evaluation of stroke follow-up.  History of hypertension, arthritis, obesity, polycystic ovarian syndrome, IBS.  Patient fell down on 09/25/2021 at 4 in the morning, found by son with right-sided weakness and aphasia.  She was last known normal around 8 PM the night before.  She was taken the hospital and code stroke was activated.  Patient was found to have left ICA occlusion at its origin.  She underwent mechanical thrombectomy and rescue left internal carotid artery stenting.  Patient transition to inpatient rehabilitation and now continues to work on recovery.  Tolerating medications.  She is eating a full p.o. diet.  Having some issues with mood lability and uncontrolled crying spells.    REVIEW OF SYSTEMS: Full 14 system review of systems performed and negative with exception of: AS PER hpi.  ALLERGIES: Allergies  Allergen Reactions   Clarithromycin Hives and Rash    HOME MEDICATIONS: Outpatient Medications Prior to Visit  Medication Sig Dispense Refill   albuterol (PROVENTIL) (2.5 MG/3ML) 0.083% nebulizer solution Take 3 mLs (2.5 mg total) by nebulization every 4 (four) hours as needed for wheezing or shortness of breath. 75 mL 12   amantadine (SYMMETREL) 50 MG/5ML solution Take 5 mLs (50 mg total) by mouth 2 (two) times daily. 100 mL 0   aspirin 81 MG chewable tablet Place 1 tablet (81 mg total) into feeding tube daily. 30 tablet 0   busPIRone (BUSPAR) 10 MG tablet      esomeprazole (NEXIUM) 40 MG capsule Take 1 capsule (40 mg total) by mouth 2 (two) times daily before a meal. (Patient taking differently: Take 40 mg by mouth daily.) 60 capsule 3   ezetimibe (ZETIA) 10 MG tablet  Take 10 mg by mouth daily.     lisinopril (ZESTRIL) 20 MG tablet Place 1 tablet (20 mg total) into feeding tube daily. 30 tablet 0   Multiple Vitamin (MULTIVITAMIN WITH MINERALS) TABS tablet Place 1 tablet into feeding tube daily. 30 tablet 0   QUEtiapine (SEROQUEL) 25 MG tablet Place 1 tablet (25 mg total) into feeding tube 2 (two) times daily. 30 tablet 0   rosuvastatin (CRESTOR) 20 MG tablet Place 1 tablet (20 mg total) into feeding tube daily. 30 tablet 0   ticagrelor (BRILINTA) 90 MG TABS tablet Place 1 tablet (90 mg total) into feeding tube 2 (two) times daily. 60 tablet 0   vitamin B-12 (VITAMIN B12) 500 MCG tablet Place 1 tablet (500 mcg total) into feeding tube daily. 30 tablet 0   No facility-administered medications prior to visit.    PAST MEDICAL HISTORY: Past Medical History:  Diagnosis Date   Anxiety    Arthritis    Cancer ([place])    skin squamous cell arm   Depression    Fibromyalgia    GERD (gastroesophageal reflux disease)    Hypertension    IBS (irritable bowel syndrome)    Pneumonia    hx   Polycystic ovarian disease    Tendonitis    Tubular adenoma of colon 2018   multiple    PAST SURGICAL HISTORY: Past Surgical History:  Procedure Laterality Date   APPENDECTOMY     BACK SURGERY     BACK SURGERY  2010  rods put in   IR ANGIO INTRA EXTRACRAN SEL COM CAROTID INNOMINATE UNI R MOD SED  09/25/2021   IR CT HEAD LTD  09/25/2021   IR CT HEAD LTD  09/25/2021   IR INTRAVSC STENT CERV CAROTID W/O EMB-PROT MOD SED INC ANGIO  09/25/2021   IR PERCUTANEOUS ART THROMBECTOMY/INFUSION INTRACRANIAL INC DIAG ANGIO  09/25/2021   IR US GUIDE VASC ACCESS LEFT  09/25/2021   IR US GUIDE VASC ACCESS RIGHT  09/25/2021   KNEE ARTHROSCOPY Left 13   NEUROPLASTY / TRANSPOSITION MEDIAN NERVE AT CARPAL TUNNEL BILATERAL     OVARIAN CYST SURGERY  1983   RADIOLOGY WITH ANESTHESIA N/A 09/25/2021   Procedure: IR WITH ANESTHESIA;  Surgeon: Luanne Bras, MD;  Location: [place];  Service:  Radiology;  Laterality: N/A;   ROTATOR CUFF REPAIR  2003,04   right, and elbow,left   TENDON RECONSTRUCTION Right 12/08/2012   Procedure: ELBOW TENDON RECONSTRUCTION/Right Lateral Epicondylar Debridement with Repair.;  Surgeon: Garald Balding, MD;  Location: [place];  Service: Orthopedics;  Laterality: Right;  Right Lateral Epicondylar Debridement with Repair.   TRIGGER FINGER RELEASE Right 08    FAMILY HISTORY: Family History  Problem Relation Age of Onset   Heart disease Brother    Arthritis Brother    Lung cancer Mother        meso   Arthritis Mother    Hyperlipidemia Brother    Arthritis Sister    Colon polyps Brother    Thyroid disease Father    Arthritis Father    Diabetes Maternal Grandmother    Colon cancer Neg Hx    Uterine cancer Neg Hx    Stomach cancer Neg Hx    Rectal cancer Neg Hx     SOCIAL HISTORY: Social History   Socioeconomic History   Marital status: Single    Spouse name: Not on file   Number of children: 1   Years of education: Not on file   Highest education level: Not on file  Occupational History   Occupation: disability  Tobacco Use   Smoking status: Every Day    Packs/day: 0.75    Years: 20.00    Total pack years: 15.00    Types: Cigarettes   Smokeless tobacco: Never   Tobacco comments:    taking wellbutrin to help quitting  use vqpor cig now  Vaping Use   Vaping Use: Never used  Substance and Sexual Activity   Alcohol use: No   Drug use: No   Sexual activity: Not on file  Other Topics Concern   Not on file  Social History Narrative   Not on file   Social Determinants of Health   Financial Resource Strain: Not on file  Food Insecurity: Not on file  Transportation Needs: Not on file  Physical Activity: Not on file  Stress: Not on file  Social Connections: Not on file  Intimate Partner Violence: Not on file     PHYSICAL EXAM  GENERAL EXAM/CONSTITUTIONAL: Vitals:  Vitals:   12/05/21 1050  BP: 129/85  Pulse: 81   Height: '5\' 7"'$  (1.702 m)   Body mass index is 37.88 kg/m. Wt Readings from Last 3 Encounters:  10/10/21 241 lb 13.5 oz (109.7 kg)  08/30/20 253 lb (114.8 kg)  07/28/18 247 lb (112 kg)   Patient is in no distress; well developed, nourished and groomed; neck is supple  CARDIOVASCULAR: Examination of carotid arteries is normal; no carotid bruits Regular rate and rhythm, no murmurs Examination  of peripheral vascular system by observation and palpation is normal  EYES: Ophthalmoscopic exam of optic discs and posterior segments is normal; no papilledema or hemorrhages No results found.  MUSCULOSKELETAL: Gait, strength, tone, movements noted in Neurologic exam below  NEUROLOGIC: MENTAL STATUS:      No data to display         awake, alert, oriented to person, SLIGHTLY DECR FLUENCY; COMP INTACT  CRANIAL NERVE:  2nd, 3rd, 4th, 6th - pupils equal and reactive to light, visual fields full to confrontation, extraocular muscles intact, no nystagmus 5th - facial sensation symmetric 7th - facial strength symmetric 8th - hearing intact 9th - palate elevates symmetrically, uvula midline 11th - shoulder shrug symmetric 12th - tongue protrusion midline  MOTOR:  normal bulk and tone, full strength in the BUE, BLE; EXCEPT RIGHT HIP FLEX 3-4, KE 3-4, KF 3-4, DF 0, PF 3 RIGHT HAND SLOW  SENSORY:  normal and symmetric to light touch, temperature, vibration  COORDINATION:  finger-nose-finger, fine finger movements SLOW ON RIGHT  REFLEXES:  deep tendon reflexes TRACE and symmetric  GAIT/STATION:  IN WHEELCHAIR     DIAGNOSTIC DATA (LABS, IMAGING, TESTING) - I reviewed patient records, labs, notes, testing and imaging myself where available.  Lab Results  Component Value Date   WBC 16.1 (H) 10/09/2021   HGB 14.6 10/09/2021   HCT 44.2 10/09/2021   MCV 92.7 10/09/2021   PLT 404 (H) 10/09/2021      Component Value Date/Time   NA 143 10/09/2021 0624   K 4.0 10/09/2021 0624    CL 111 10/09/2021 0624   CO2 21 (L) 10/09/2021 0624   GLUCOSE 127 (H) 10/09/2021 0624   BUN 18 10/09/2021 0624   CREATININE 0.81 10/09/2021 0624   CALCIUM 9.6 10/09/2021 0624   PROT 6.6 09/25/2021 0505   ALBUMIN 3.8 09/25/2021 0505   AST 39 09/25/2021 0505   ALT 31 09/25/2021 0505   ALKPHOS 73 09/25/2021 0505   BILITOT 0.3 09/25/2021 0505   GFRNONAA >60 10/09/2021 0624   GFRAA >90 12/01/2012 1425   Lab Results  Component Value Date   CHOL 233 (H) 09/27/2021   HDL 45 09/27/2021   LDLCALC 139 (H) 09/27/2021   TRIG 243 (H) 09/27/2021   CHOLHDL 5.2 09/27/2021   Lab Results  Component Value Date   HGBA1C 5.8 (H) 09/25/2021   Lab Results  Component Value Date   VITAMINB12 161 (L) 10/02/2021   Lab Results  Component Value Date   TSH 3.056 10/02/2021    09/26/21 MRI BRAIN  1. Large evolving acute ischemic left MCA distribution infarct as above. No associated hemorrhage or significant regional mass effect. 2. Additional small volume acute ischemic nonhemorrhagic right ACA and/or ACA/MCA watershed distribution infarct. 3. Small area of restricted diffusion involving the right anterior temporal pole. While this finding may reflect an additional site of ischemia, a possible cortical contusion could also be considered given the presumed history of trauma. No associated hemorrhage. 4. Small soft tissue contusion at the right frontotemporal scalp.   09/26/21 MRA HEAD  1. Interval revascularization of previously seen left ICA/MCA occlusion. Asymmetric prominence of distal left MCA branches, consistent with a degree of luxury perfusion status post revascularization. 2. Asymmetric irregularity about the left carotid siphon, indeterminate. While this finding could be atherosclerotic in nature, a degree of vasospasm could also be present given the recent intervention. 3. Focal flow defect at the distal cervical left ICA as above. While this finding could be artifactual in  nature  as no discrete stenosis is seen at this location on prior arteriogram, a possible focus of residual and/or recurrent partially occlusive thrombus is difficult to exclude. Associated moderate approximate 50% stenosis at this level. 4. Otherwise wide patency of the major intracranial arterial vasculature.       ASSESSMENT AND PLAN  51 y.o. year old female here with:   Dx:  1. Cerebrovascular accident (CVA) due to bilateral embolism of middle cerebral arteries ([place])       PLAN:  Bilateral embolic strokes; MAINLY ON LEFT, BUT SLIGHTLY ON RIGHT (left ICA occlusion, s/p thrombectomy and stenting; suspect proximal cardio-aortic embolic source) - request implanted loop recorder (for bilateral embolic strokes) - continue aspirin + brillinta, zetia, rosuvastatin, BP control - follow up with neuro IR (Dr. Gerhard Perches; stent follow up)  MOOD LABILITY (depression) - stop seroquel - stop amantadine - continue buspirone '15mg'$  daily - start nuedexta (20-10) twice a day (for pseudobulbar affect)  B12 deficiency - continue B12 replacement; follow up with PCP  Orders Placed This Encounter  Procedures   Ambulatory referral to Interventional Radiology   Ambulatory referral to Cardiac Electrophysiology   Meds ordered this encounter  Medications   Dextromethorphan-quiNIDine (NUEDEXTA) 20-10 MG capsule    Sig: Take 1 capsule by mouth 2 (two) times daily.    Dispense:  60 capsule    Refill:  12   DISCONTD: busPIRone (BUSPAR) 15 MG tablet    Sig: Take 1 tablet (15 mg total) by mouth 3 (three) times daily.    Dispense:  30 tablet    Refill:  12   busPIRone (BUSPAR) 15 MG tablet    Sig: Take 1 tablet (15 mg total) by mouth daily.    Dispense:  30 tablet    Refill:  12   Return in about 4 months (around 04/05/2022) for with Dr Leonie Man (saw in the hospital).  I spent 60 minutes of face-to-face and non-face-to-face time with patient.  This included previsit chart review, lab review, study  review, order entry, electronic health record documentation, patient education.     Penni Bombard, MD 72/0/9470, 96:28 AM Certified in Neurology, Neurophysiology and Neuroimaging  [facility] [address] [place], [place] [address] [phone]

## 2021-12-05 NOTE — Patient Instructions (Addendum)
  Bilateral embolic strokes (left ICA occlusion, sp thrombectomy and stenting) - request implanted loop recorder (for bilateral embolic strokes) - continue aspirin + brillinta, zetia, rosuvastatin, BP control - follow up with neuro IR (Dr. Gerhard Perches; stent follow up)  MOOD LABILITY (depression) - stop seroquel - stop amantadine - continue buspirone '15mg'$  daily - start nuedexta (20-10) twice a day (for pseudobulbar affect)  B12 deficiency - continue B12 replacement

## 2022-01-11 ENCOUNTER — Institutional Professional Consult (permissible substitution): Payer: Medicare HMO | Admitting: Cardiovascular Disease

## 2022-01-11 ENCOUNTER — Encounter: Payer: Self-pay | Admitting: Cardiovascular Disease

## 2022-01-11 ENCOUNTER — Ambulatory Visit: Payer: Medicare Other | Attending: Cardiovascular Disease | Admitting: Cardiovascular Disease

## 2022-01-11 VITALS — BP 118/72 | HR 84 | Ht 67.0 in | Wt 227.0 lb

## 2022-01-11 DIAGNOSIS — I63512 Cerebral infarction due to unspecified occlusion or stenosis of left middle cerebral artery: Secondary | ICD-10-CM

## 2022-01-11 NOTE — Patient Instructions (Signed)
Medication Instructions:  Your physician recommends that you continue on your current medications as directed. Please refer to the Current Medication list given to you today.  *If you need a refill on your cardiac medications before your next appointment, please call your pharmacy*   Testing/Procedures: You will have a loop recorder implanted at your next office visit. Do not take your AM dose of Brillinta the morning of your appointment.    Follow-Up: At [facility], you and your health needs are our priority.  As part of our continuing mission to provide you with exceptional heart care, we have created designated Provider Care Teams.  These Care Teams include your primary Cardiologist (physician) and Advanced Practice Providers (APPs -  Physician Assistants and Nurse Practitioners) who all work together to provide you with the care you need, when you need it.  Your next appointment:   Loop Implant  The format for your next appointment:   In Person  Provider:   Doralee Albino, MD     Important Information About Sugar

## 2022-01-11 NOTE — Progress Notes (Signed)
Electrophysiology Office Note:    Date:  01/11/2022   ID:  Erica Mcdaniel, DOB 07/08/1970, MRN [id number]  PCP:  Patient, No Pcp Per   [employer] Providers Cardiologist:  None Electrophysiologist:  Melida Quitter, MD     Referring MD: Penni Bombard, MD   History of Present Illness:    Erica Mcdaniel is a 51 y.o. female with a hx listed below, significant for stroke, referred for ILR placement.  He was admitted with bilateral strokes in September, 2023, suspicious for a proximal source, possibly atrial fibrillation.  Past Medical History:  Diagnosis Date   Anxiety    Arthritis    Cancer ([place])    skin squamous cell arm   Depression    Fibromyalgia    GERD (gastroesophageal reflux disease)    Hypertension    IBS (irritable bowel syndrome)    Pneumonia    hx   Polycystic ovarian disease    Tendonitis    Tubular adenoma of colon 2018   multiple    Past Surgical History:  Procedure Laterality Date   APPENDECTOMY     BACK SURGERY     BACK SURGERY  2010   rods put in   IR ANGIO INTRA EXTRACRAN SEL COM CAROTID INNOMINATE UNI R MOD SED  09/25/2021   IR CT HEAD LTD  09/25/2021   IR CT HEAD LTD  09/25/2021   IR INTRAVSC STENT CERV CAROTID W/O EMB-PROT MOD SED INC ANGIO  09/25/2021   IR PERCUTANEOUS ART THROMBECTOMY/INFUSION INTRACRANIAL INC DIAG ANGIO  09/25/2021   IR US GUIDE VASC ACCESS LEFT  09/25/2021   IR US GUIDE VASC ACCESS RIGHT  09/25/2021   KNEE ARTHROSCOPY Left 13   NEUROPLASTY / TRANSPOSITION MEDIAN NERVE AT CARPAL TUNNEL BILATERAL     OVARIAN CYST SURGERY  1983   RADIOLOGY WITH ANESTHESIA N/A 09/25/2021   Procedure: IR WITH ANESTHESIA;  Surgeon: Luanne Bras, MD;  Location: [place];  Service: Radiology;  Laterality: N/A;   ROTATOR CUFF REPAIR  2003,04   right, and elbow,left   TENDON RECONSTRUCTION Right 12/08/2012   Procedure: ELBOW TENDON RECONSTRUCTION/Right Lateral Epicondylar Debridement with Repair.;  Surgeon: Garald Balding, MD;  Location: [place];  Service: Orthopedics;  Laterality: Right;  Right Lateral Epicondylar Debridement with Repair.   TRIGGER FINGER RELEASE Right 08    Current Medications: No outpatient medications have been marked as taking for the 01/11/22 encounter (Office Visit) with Michio Thier, Yetta Barre, MD.     Allergies:   Clarithromycin   Social History   Socioeconomic History   Marital status: Single    Spouse name: Not on file   Number of children: 1   Years of education: Not on file   Highest education level: Not on file  Occupational History   Occupation: disability  Tobacco Use   Smoking status: Every Day    Packs/day: 0.75    Years: 20.00    Total pack years: 15.00    Types: Cigarettes   Smokeless tobacco: Never   Tobacco comments:    taking wellbutrin to help quitting  use vqpor cig now  Vaping Use   Vaping Use: Never used  Substance and Sexual Activity   Alcohol use: No   Drug use: No   Sexual activity: Not on file  Other Topics Concern   Not on file  Social History Narrative   Not on file   Social Determinants of Health   Financial Resource Strain: Not on  file  Food Insecurity: Not on file  Transportation Needs: Not on file  Physical Activity: Not on file  Stress: Not on file  Social Connections: Not on file     Family History: The patient's family history includes Arthritis in her brother, father, mother, and sister; Colon polyps in her brother; Diabetes in her maternal grandmother; Heart disease in her brother; Hyperlipidemia in her brother; Lung cancer in her mother; Thyroid disease in her father. There is no history of Colon cancer, Uterine cancer, Stomach cancer, or Rectal cancer.  ROS:   Please see the history of present illness.    All other systems reviewed and are negative.  EKGs/Labs/Other Studies Reviewed Today:     TTE 09/25/21: EF 65-70%  EKG:  Last EKG results: today - sinus rhythm   Recent Labs: 09/25/2021: ALT 31 09/28/2021:  Magnesium 2.5 10/02/2021: TSH 3.056 10/09/2021: BUN 18; Creatinine, Ser 0.81; Hemoglobin 14.6; Platelets 404; Potassium 4.0; Sodium 143     Physical Exam:    VS:  BP 118/72   Pulse 84   Ht '5\' 7"'$  (1.702 m)   Wt 227 lb (103 kg)   SpO2 95%   BMI 35.55 kg/m     Wt Readings from Last 3 Encounters:  01/11/22 227 lb (103 kg)  10/10/21 241 lb 13.5 oz (109.7 kg)  08/30/20 253 lb (114.8 kg)     GEN:  Well nourished, well developed in no acute distress though appears anxious and tearful CARDIAC: RRR, no murmurs, rubs, gallops RESPIRATORY:  Normal work of breathing MUSCULOSKELETAL: no edema    ASSESSMENT & PLAN:    Stroke: bilateral strokes concerning for a cardiac source. Loop recorder has been requested for detection of AF. I explained the loop recorder implant procedure.  I explained the logistics and risk which include minor bleeding or infection. 2.   Labile mood: on seroquel, amantadine, buspirone       Medication Adjustments/Labs and Tests Ordered: Current medicines are reviewed at length with the patient today.  Concerns regarding medicines are outlined above.  Orders Placed This Encounter  Procedures   EKG 12-Lead   No orders of the defined types were placed in this encounter.    Signed, Melida Quitter, MD  01/11/2022 2:34 PM    [place]

## 2022-01-15 ENCOUNTER — Other Ambulatory Visit: Payer: Self-pay

## 2022-01-15 NOTE — Patient Outreach (Signed)
First telephone outreach attempt to obtain mRS. Spoke with son asked to call back tomorrow between 12pm-2pm  [place] Management Assistant [phone]

## 2022-01-17 ENCOUNTER — Other Ambulatory Visit: Payer: Self-pay

## 2022-01-17 NOTE — Patient Outreach (Signed)
Telephone outreach to patient to obtain mRS was successfully completed. MRS= 3  Erica Mcdaniel [facility] Care Management Assistant [phone]  

## 2022-01-24 ENCOUNTER — Ambulatory Visit: Payer: Medicare Other | Admitting: Cardiovascular Disease

## 2022-01-30 ENCOUNTER — Other Ambulatory Visit (HOSPITAL_COMMUNITY): Payer: Self-pay | Admitting: Neuroradiology

## 2022-01-30 DIAGNOSIS — I63512 Cerebral infarction due to unspecified occlusion or stenosis of left middle cerebral artery: Secondary | ICD-10-CM

## 2022-02-11 ENCOUNTER — Encounter: Payer: Self-pay | Admitting: Cardiovascular Disease

## 2022-02-11 ENCOUNTER — Encounter (HOSPITAL_COMMUNITY): Payer: Medicare Other

## 2022-02-11 ENCOUNTER — Ambulatory Visit: Payer: Medicare Other | Attending: Cardiovascular Disease | Admitting: Cardiovascular Disease

## 2022-02-11 VITALS — BP 110/72 | HR 76 | Ht 68.0 in | Wt 226.0 lb

## 2022-02-11 DIAGNOSIS — I63512 Cerebral infarction due to unspecified occlusion or stenosis of left middle cerebral artery: Secondary | ICD-10-CM | POA: Diagnosis not present

## 2022-02-11 NOTE — Progress Notes (Signed)
Electrophysiology Office Note:    Date:  02/11/2022   ID:  Erica Mcdaniel, DOB 1970-08-25, MRN [id number]  PCP:  Patient, No Pcp Per   [facility] Providers Cardiologist:  None Electrophysiologist:  Melida Quitter, MD     Referring MD: No ref. provider found   History of Present Illness:    Erica Mcdaniel is a 52 y.o. female with a hx listed below, significant for stroke, referred for ILR placement.  He was admitted with bilateral strokes in September, 2023, suspicious for a proximal source, possibly atrial fibrillation.  Past Medical History:  Diagnosis Date   Anxiety    Arthritis    Cancer ([place])    skin squamous cell arm   Depression    Fibromyalgia    GERD (gastroesophageal reflux disease)    Hypertension    IBS (irritable bowel syndrome)    Pneumonia    hx   Polycystic ovarian disease    Tendonitis    Tubular adenoma of colon 2018   multiple    Past Surgical History:  Procedure Laterality Date   APPENDECTOMY     BACK SURGERY     BACK SURGERY  2010   rods put in   IR ANGIO INTRA EXTRACRAN SEL COM CAROTID INNOMINATE UNI R MOD SED  09/25/2021   IR CT HEAD LTD  09/25/2021   IR CT HEAD LTD  09/25/2021   IR INTRAVSC STENT CERV CAROTID W/O EMB-PROT MOD SED INC ANGIO  09/25/2021   IR PERCUTANEOUS ART THROMBECTOMY/INFUSION INTRACRANIAL INC DIAG ANGIO  09/25/2021   IR US GUIDE VASC ACCESS LEFT  09/25/2021   IR US GUIDE VASC ACCESS RIGHT  09/25/2021   KNEE ARTHROSCOPY Left 13   NEUROPLASTY / TRANSPOSITION MEDIAN NERVE AT CARPAL TUNNEL BILATERAL     OVARIAN CYST SURGERY  1983   RADIOLOGY WITH ANESTHESIA N/A 09/25/2021   Procedure: IR WITH ANESTHESIA;  Surgeon: Luanne Bras, MD;  Location: [facility];  Service: Radiology;  Laterality: N/A;   ROTATOR CUFF REPAIR  2003,04   right, and elbow,left   TENDON RECONSTRUCTION Right 12/08/2012   Procedure: ELBOW TENDON RECONSTRUCTION/Right Lateral Epicondylar Debridement with Repair.;  Surgeon: Garald Balding, MD;  Location: [facility];  Service: Orthopedics;  Laterality: Right;  Right Lateral Epicondylar Debridement with Repair.   TRIGGER FINGER RELEASE Right 08    Current Medications: No outpatient medications have been marked as taking for the 02/11/22 encounter (Appointment) with Mickal Meno, Yetta Barre, MD.     Allergies:   Clarithromycin   Social History   Socioeconomic History   Marital status: Single    Spouse name: Not on file   Number of children: 1   Years of education: Not on file   Highest education level: Not on file  Occupational History   Occupation: disability  Tobacco Use   Smoking status: Every Day    Packs/day: 0.75    Years: 20.00    Total pack years: 15.00    Types: Cigarettes   Smokeless tobacco: Never   Tobacco comments:    taking wellbutrin to help quitting  use vqpor cig now  Vaping Use   Vaping Use: Never used  Substance and Sexual Activity   Alcohol use: No   Drug use: No   Sexual activity: Not on file  Other Topics Concern   Not on file  Social History Narrative   Not on file   Social Determinants of Health   Financial Resource Strain: Not on file  Food Insecurity: Not on file  Transportation Needs: Not on file  Physical Activity: Not on file  Stress: Not on file  Social Connections: Not on file     Family History: The patient's family history includes Arthritis in her brother, father, mother, and sister; Colon polyps in her brother; Diabetes in her maternal grandmother; Heart disease in her brother; Hyperlipidemia in her brother; Lung cancer in her mother; Thyroid disease in her father. There is no history of Colon cancer, Uterine cancer, Stomach cancer, or Rectal cancer.  ROS:   Please see the history of present illness.    All other systems reviewed and are negative.  EKGs/Labs/Other Studies Reviewed Today:     TTE 09/25/21: EF 65-70%  EKG:  Last EKG results: today - sinus rhythm   Recent Labs: 09/25/2021: ALT 31 09/28/2021:  Magnesium 2.5 10/02/2021: TSH 3.056 10/09/2021: BUN 18; Creatinine, Ser 0.81; Hemoglobin 14.6; Platelets 404; Potassium 4.0; Sodium 143     Physical Exam:    VS:  There were no vitals taken for this visit.    Wt Readings from Last 3 Encounters:  01/11/22 227 lb (103 kg)  10/10/21 241 lb 13.5 oz (109.7 kg)  08/30/20 253 lb (114.8 kg)     GEN:  Well nourished, well developed in no acute distress though appears anxious and tearful CARDIAC: RRR, no murmurs, rubs, gallops RESPIRATORY:  Normal work of breathing MUSCULOSKELETAL: no edema    ASSESSMENT & PLAN:    Stroke: bilateral strokes concerning for a cardiac source. Loop recorder has been requested for detection of AF. I explained the loop recorder implant procedure.  I explained the logistics and risk which include minor bleeding or infection. 2.   Labile mood: on seroquel, amantadine, buspirone        PROCEDURES performed in clinic:   1. Implantable loop recorder implantation    DESCRIPTION OF PROCEDURE:  Informed written consent was obtained.  The patient required no sedation for the procedure today.  Mapping over the patient's chest was performed to identify the area where electrograms were most prominent for ILR recording.  This area was found to be the left parasternal region over the 4th intercostal space. The patients left chest was therefore prepped and draped in the usual sterile fashion. The skin overlying the left parasternal region was infiltrated with lidocaine for local analgesia.  A 0.5-cm incision was made over the left parasternal region over the 3rd intercostal space.  A Medtronic implantable loop recorder was then placed into the pocket  R waves were very prominent and measured >0.38m.  Steri- Strips and a sterile dressing were then applied.  There were no early apparent complications.     CONCLUSIONS:   1. Successful implantation of a medtronic implantable loop recorder (SN [id number]) for a history of cryptogenic  stroke  2. No early apparent complications.    Signed, AMelida Quitter MD  02/11/2022 2:09 PM    [place]

## 2022-02-11 NOTE — Patient Instructions (Addendum)
Medication Instructions:  Your physician recommends that you continue on your current medications as directed. Please refer to the Current Medication list given to you today.  Labwork: None ordered.  Testing/Procedures: None ordered.  Follow-Up:  Your physician wants you to follow-up as needed with Dr. [name].  You will receive a reminder letter in the mail two months in advance. If you don't receive a letter, please call our office to schedule the follow-up appointment.   Implantable Loop Recorder Placement, Care After This sheet gives you information about how to care for yourself after your procedure. Your health care provider may also give you more specific instructions. If you have problems or questions, contact your health care provider. What can I expect after the procedure? After the procedure, it is common to have: Soreness or discomfort near the incision. Some swelling or bruising near the incision.  Follow these instructions at home: Incision care  Monitor your cardiac device site for redness, swelling, and drainage. Call the device clinic at [phone] if you experience these symptoms or fever/chills.  Keep the large square bandage on your site for 24 hours and then you may remove it yourself. Keep the steri-strips underneath in place.   You may shower after 72 hours / 3 days from your procedure with the steri-strips in place. They will usually fall off on their own, or may be removed after 10 days. Pat dry.   Avoid lotions, ointments, or perfumes over your incision until it is well-healed.  Please do not submerge in water until your site is completely healed.   Your device is MRI compatible.   Remote monitoring is used to monitor your cardiac device from home. This monitoring is scheduled every month by our office. It allows [place] to keep an eye on the function of your device to ensure it is working properly.  If your wound site starts to bleed apply pressure.     For help with the monitor please call Medtronic Monitor Support Specialist directly at [phone].    If you have any questions/concerns please call the device clinic at [phone].  Activity  Return to your normal activities.  General instructions Follow instructions from your health care provider about how to manage your implantable loop recorder and transmit the information. Learn how to activate a recording if this is necessary for your type of device. You may go through a metal detection gate, and you may let someone hold a metal detector over your chest. Show your ID card if needed. Do not have an MRI unless you check with your health care provider first. Take over-the-counter and prescription medicines only as told by your health care provider. Keep all follow-up visits as told by your health care provider. This is important. Contact a health care provider if: You have redness, swelling, or pain around your incision. You have a fever. You have pain that is not relieved by your pain medicine. You have triggered your device because of fainting (syncope) or because of a heartbeat that feels like it is racing, slow, fluttering, or skipping (palpitations). Get help right away if you have: Chest pain. Difficulty breathing. Summary After the procedure, it is common to have soreness or discomfort near the incision. Change your dressing as told by your health care provider. Follow instructions from your health care provider about how to manage your implantable loop recorder and transmit the information. Keep all follow-up visits as told by your health care provider. This is important. This information is  not intended to replace advice given to you by your health care provider. Make sure you discuss any questions you have with your health care provider. Document Released: 12/26/2014 Document Revised: 03/01/2017 Document Reviewed: 03/01/2017 Elsevier Patient Education  2020 [employer].

## 2022-02-14 ENCOUNTER — Ambulatory Visit (HOSPITAL_COMMUNITY)
Admission: RE | Admit: 2022-02-14 | Discharge: 2022-02-14 | Disposition: A | Discharge status: Home / Self Care | Payer: Medicare Other | Source: Ambulatory Visit | Attending: Radiology | Admitting: Radiology

## 2022-02-14 ENCOUNTER — Ambulatory Visit (HOSPITAL_COMMUNITY)
Admission: RE | Admit: 2022-02-14 | Discharge: 2022-02-14 | Disposition: A | Discharge status: Home / Self Care | Payer: Medicare Other | Source: Ambulatory Visit | Attending: Neuroradiology | Admitting: Neuroradiology

## 2022-02-14 ENCOUNTER — Other Ambulatory Visit (HOSPITAL_COMMUNITY): Payer: Self-pay | Admitting: Interventional Radiology

## 2022-02-14 DIAGNOSIS — I63512 Cerebral infarction due to unspecified occlusion or stenosis of left middle cerebral artery: Secondary | ICD-10-CM

## 2022-02-14 NOTE — Progress Notes (Signed)
VASCULAR LAB    Carotid duplex has been performed.  See CV proc for preliminary results.   Nataliah Hatlestad, RVT 02/14/2022, 1:32 PM

## 2022-02-20 ENCOUNTER — Telehealth (HOSPITAL_COMMUNITY): Payer: Self-pay

## 2022-02-20 NOTE — Telephone Encounter (Signed)
Pt's son agreed to f/u in 6 months with US carotid. AB

## 2022-03-01 IMAGING — MG MM DIGITAL SCREENING BILAT W/ TOMO AND CAD
8 series · 8 of 24 positions shown · non-contrast
Comparison: Previous exam(s).

CLINICAL DATA: Screening.

EXAM:
DIGITAL SCREENING BILATERAL MAMMOGRAM WITH TOMOSYNTHESIS AND CAD
TECHNIQUE: Bilateral screening digital craniocaudal and mediolateral oblique
mammograms were obtained. Bilateral screening digital breast
tomosynthesis was performed. The images were evaluated with
computer-aided detection.

[R CC synth-2D]
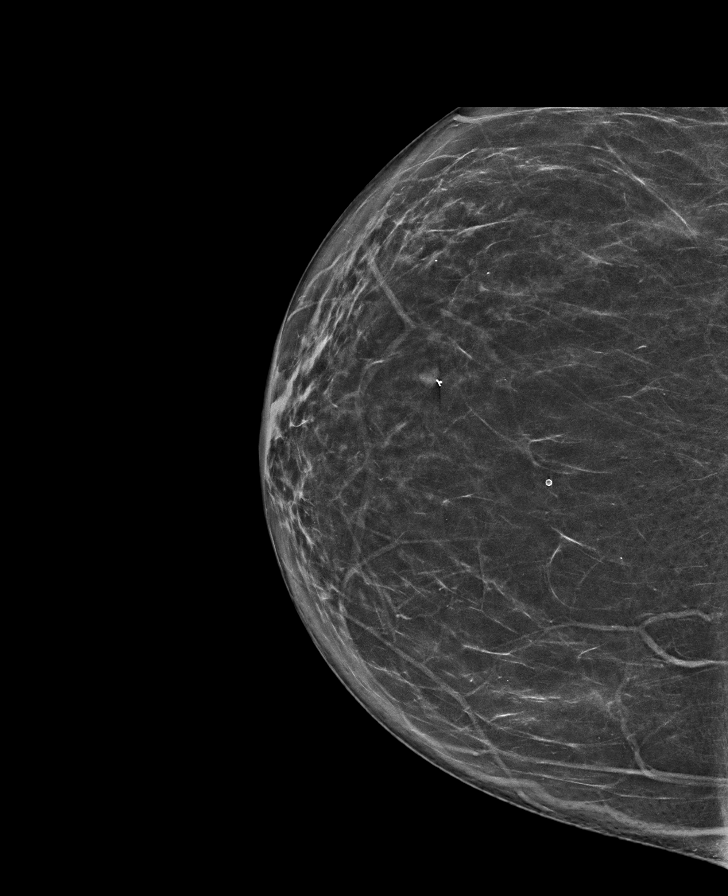

[L CC synth-2D]
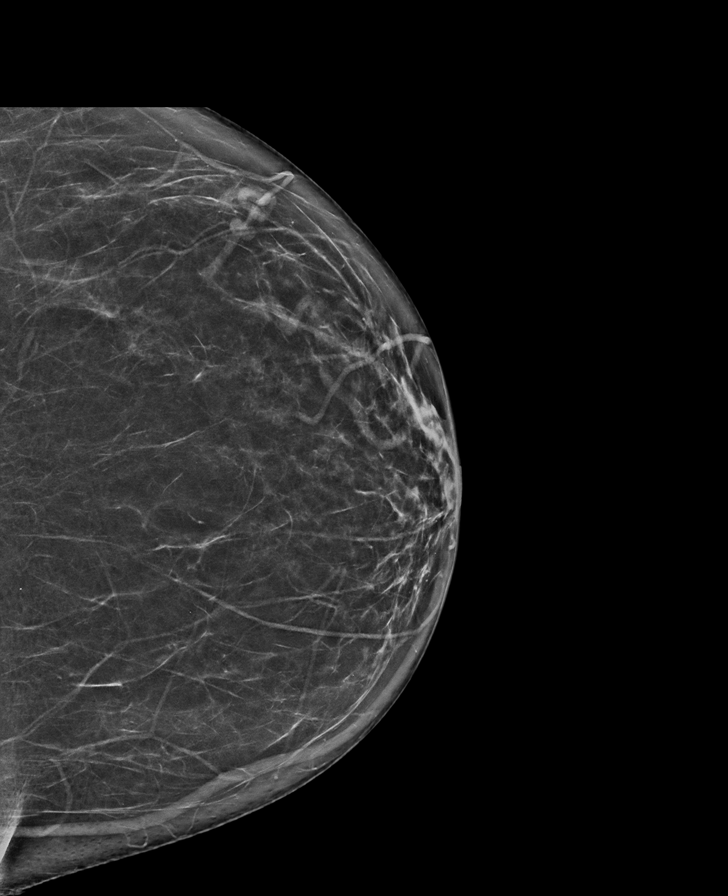

[R MLO synth-2D]
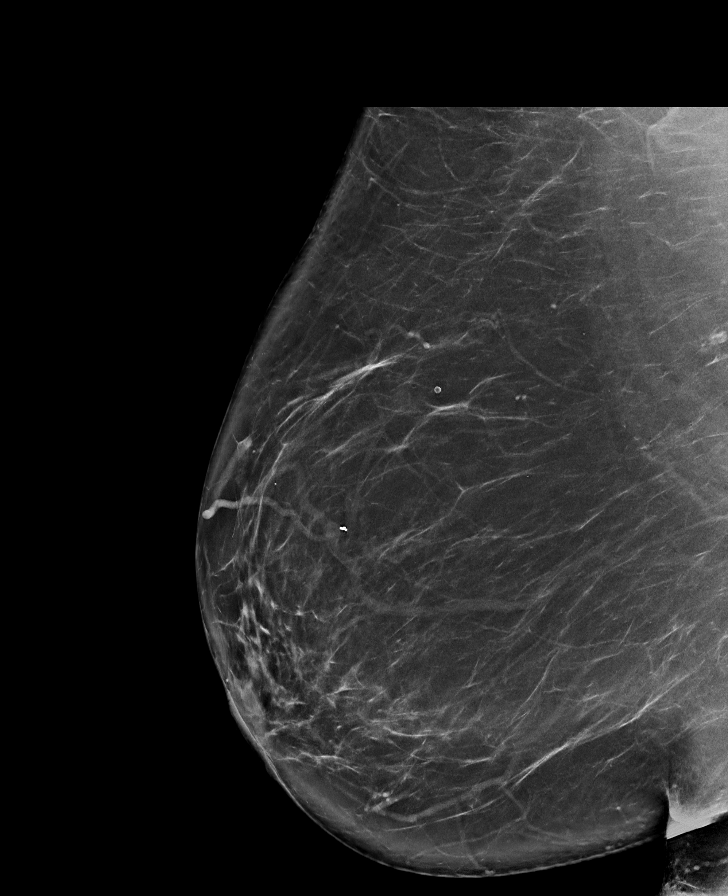

[L MLO synth-2D]
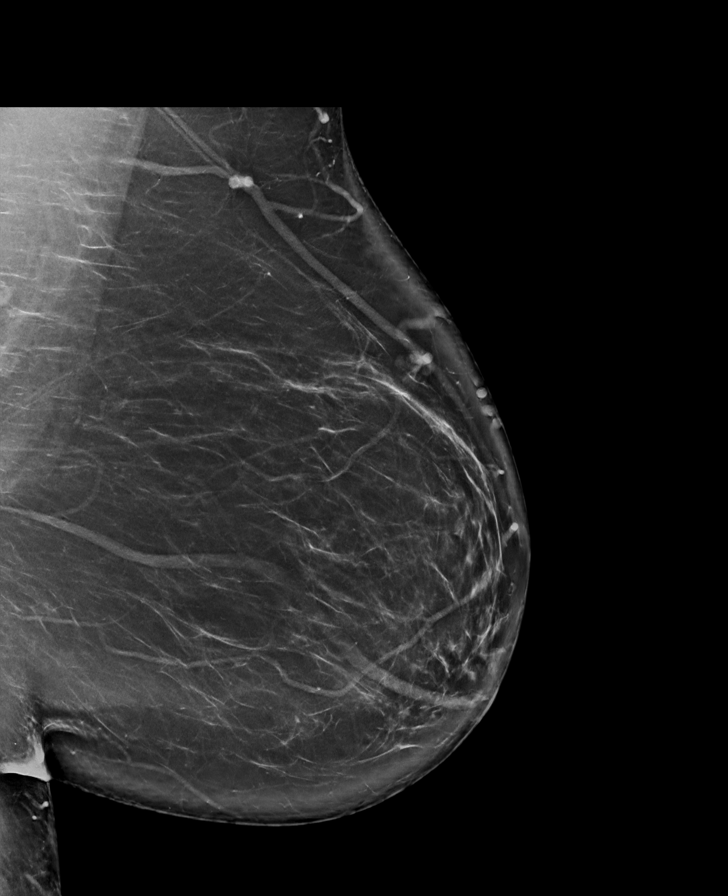

[R MLO tomo · tomo slice 53/105.0]
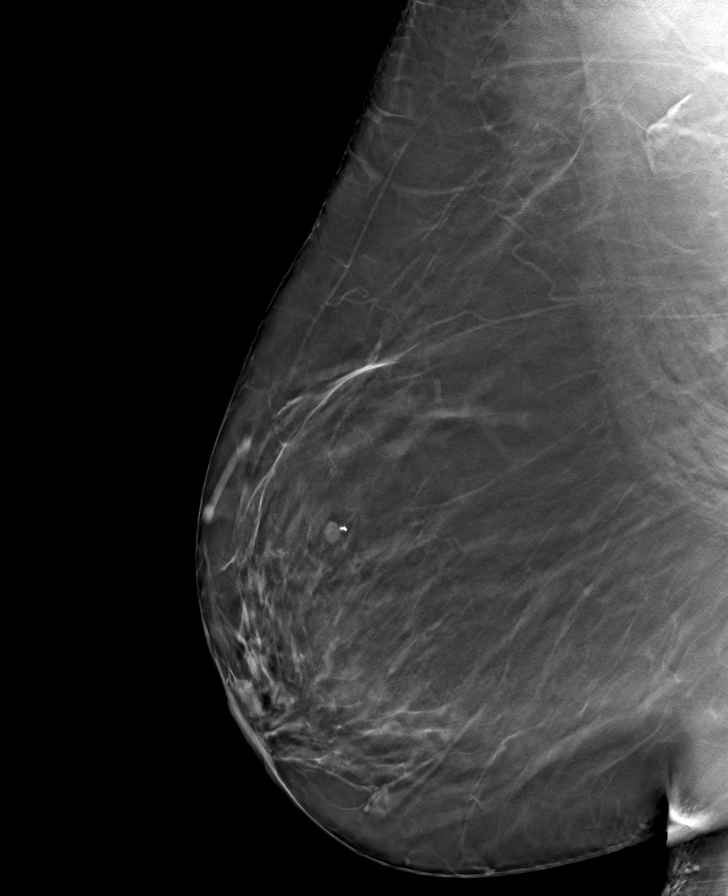

[L MLO tomo · tomo slice 51/100.0]
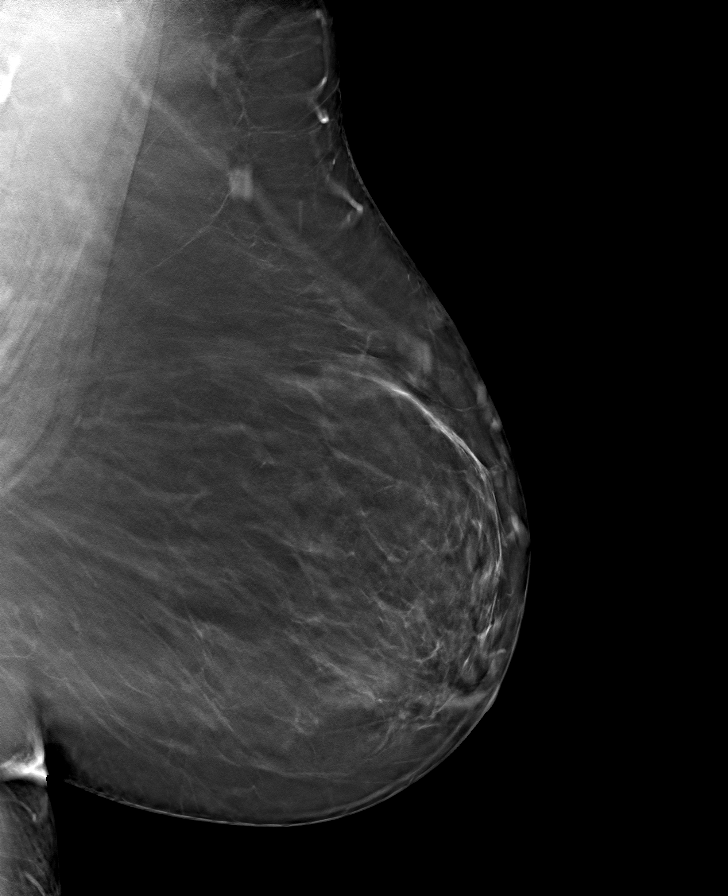

[L CC tomo · tomo slice 39/77.0]
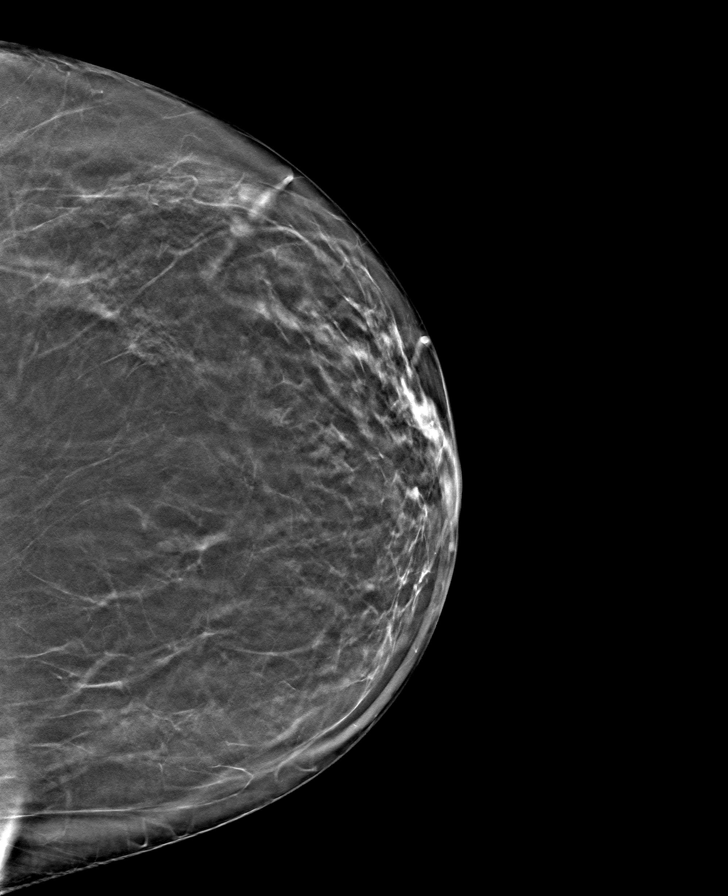

[R CC tomo · tomo slice 41/82.0]
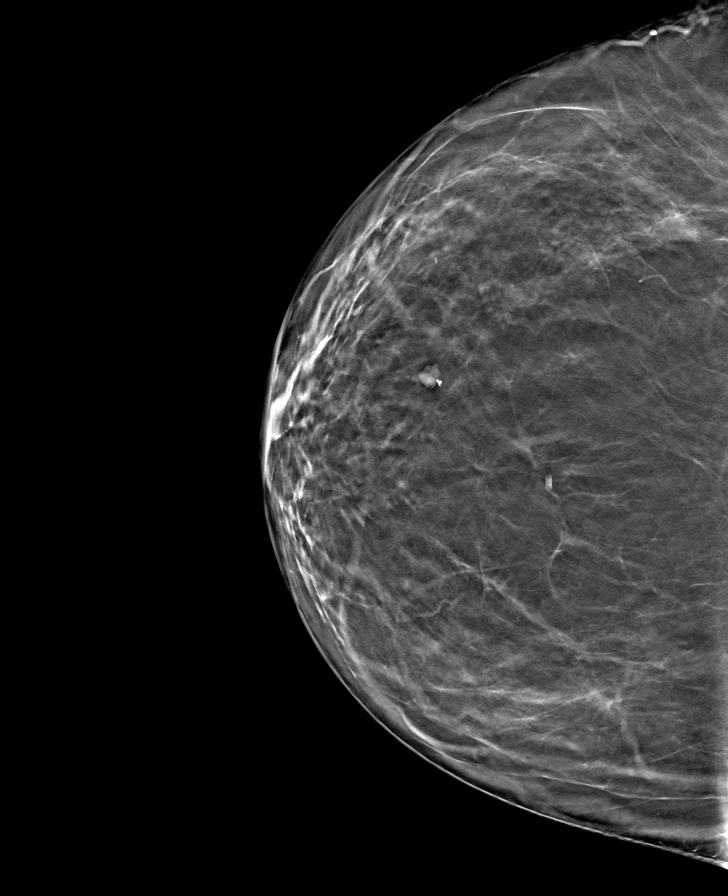

[8 of 24 positions shown; findings below may reference images not displayed]

ACR Breast Density Category b: There are scattered areas of
fibroglandular density.
FINDINGS: There are no findings suspicious for malignancy.
IMPRESSION: No mammographic evidence of malignancy. A result letter of this
screening mammogram will be mailed directly to the patient.

RECOMMENDATION:
Screening mammogram in one year. (Code:51-O-LD2)

BI-RADS CATEGORY  1: Negative.

## 2022-03-19 ENCOUNTER — Ambulatory Visit (INDEPENDENT_AMBULATORY_CARE_PROVIDER_SITE_OTHER): Payer: 59

## 2022-03-19 DIAGNOSIS — I63512 Cerebral infarction due to unspecified occlusion or stenosis of left middle cerebral artery: Secondary | ICD-10-CM

## 2022-03-19 LAB — CUP PACEART REMOTE DEVICE CHECK
Date Time Interrogation Session: 20240220023617
Implantable Pulse Generator Implant Date: 20240115

## 2022-04-16 ENCOUNTER — Ambulatory Visit (INDEPENDENT_AMBULATORY_CARE_PROVIDER_SITE_OTHER): Payer: 59 | Admitting: Neurology

## 2022-04-16 ENCOUNTER — Encounter: Payer: Self-pay | Admitting: Neurology

## 2022-04-16 VITALS — BP 114/76 | HR 83

## 2022-04-16 DIAGNOSIS — G8190 Hemiplegia, unspecified affecting unspecified side: Secondary | ICD-10-CM

## 2022-04-16 DIAGNOSIS — I69320 Aphasia following cerebral infarction: Secondary | ICD-10-CM | POA: Diagnosis not present

## 2022-04-16 DIAGNOSIS — Z95828 Presence of other vascular implants and grafts: Secondary | ICD-10-CM

## 2022-04-16 MED ORDER — TICAGRELOR 90 MG PO TABS: 90.0000 mg | ORAL_TABLET | Freq: Two times a day (BID) | ORAL | 0 refills | Status: DC

## 2022-04-16 NOTE — Progress Notes (Signed)
[facility] [address] [place]. [place] [address] [phone]       OFFICE FOLLOW-UP NOTE  Erica Mcdaniel Date of Birth:  [birth year] Medical Record Number:  [id number]   HPI: [name] is a 52 year old pleasant Caucasian lady seen today for office follow-up visit following hospital admission for stroke in August 2023.  She is accompanied by her son.  This is obtained from them and review of electronic medical records and personally reviewed pertinent available imaging films in PACS.  She has history of hypertension, obesity, arthritis, polycystic ovarian syndrome.  She fell down on 09/19/2021 4 in the morning and head on admission showed dense left MCA sign spectrogram.  CT angiogram showed emergent large vessel occlusion of the left ICA origin short segment intracranial reconstitution patient underwent emergent mechanical thrombectomy with TICI2b revascularization left M2 andTICI2c revascularization of the left great toe.   She required placement of rescue proximal left ICA stent for pre-existing 50 to 75% stenosis.  2D echo showed ejection fraction of 65 to 70%.  LDL cholesterol 137 mg percent.  Hemoglobin A1c was 5.8.  Urine drug screen was positive for marijuana.  He was started on dual antiplatelet therapy aspirin and Brilinta and transferred to inpatient rehab.  Patient has been home since January.  She is doing well.  She is getting home physical occupational and speech therapy.  Her speech is a lot improved.  She is able to communicate quite well and only occasionally when tired.  As result/disease along blood.  Right upper extremity strength is also improving she can use the right hand very well.  She still has weakness in the right leg particularly from ankle down.  She is able to walk with the AFO with the help of physical therapist and does not walk on her own yet.  She is tolerating aspirin and Brilinta well with minor bruising but no bleeding.  Blood pressure  is under good control today 114/76.  She was on Nuedexta for emotional lability but has not taken it for a while and does not complain of significant lability at present.  She is tolerating Crestor well without side effects. She was seen by Dr. Leta Baptist for follow-up on 12/05/2021 and has improved since then.  ROS:   14 system review of systems is positive for speech difficulty, aphasia, weakness, walking difficulty, bruising and all other systems  PMH:  Past Medical History:  Diagnosis Date   Anxiety    Arthritis    Cancer ([place])    skin squamous cell arm   Depression    Fibromyalgia    GERD (gastroesophageal reflux disease)    Hypertension    IBS (irritable bowel syndrome)    Pneumonia    hx   Polycystic ovarian disease    Tendonitis    Tubular adenoma of colon 2018   multiple    Social History:  Social History   Socioeconomic History   Marital status: Single    Spouse name: Not on file   Number of children: 1   Years of education: Not on file   Highest education level: Not on file  Occupational History   Occupation: disability  Tobacco Use   Smoking status: Every Day    Packs/day: 0.75    Years: 20.00    Additional pack years: 0.00    Total pack years: 15.00    Types: Cigarettes   Smokeless tobacco: Never   Tobacco comments:    taking wellbutrin to help quitting  use vqpor cig now  Vaping Use   Vaping Use: Never used  Substance and Sexual Activity   Alcohol use: No   Drug use: No   Sexual activity: Not on file  Other Topics Concern   Not on file  Social History Narrative   Not on file   Social Determinants of Health   Financial Resource Strain: Not on file  Food Insecurity: Not on file  Transportation Needs: Not on file  Physical Activity: Not on file  Stress: Not on file  Social Connections: Not on file  Intimate Partner Violence: Not on file    Medications:   Current Outpatient Medications on File Prior to Visit  Medication Sig Dispense  Refill   albuterol (PROVENTIL) (2.5 MG/3ML) 0.083% nebulizer solution Take 3 mLs (2.5 mg total) by nebulization every 4 (four) hours as needed for wheezing or shortness of breath. 75 mL 12   aspirin 81 MG chewable tablet Place 1 tablet (81 mg total) into feeding tube daily. 30 tablet 0   busPIRone (BUSPAR) 15 MG tablet Take 1 tablet (15 mg total) by mouth daily. 30 tablet 12   esomeprazole (NEXIUM) 40 MG capsule Take 1 capsule (40 mg total) by mouth 2 (two) times daily before a meal. (Patient taking differently: Take 40 mg by mouth daily.) 60 capsule 3   ezetimibe (ZETIA) 10 MG tablet Take 10 mg by mouth daily.     lisinopril (ZESTRIL) 20 MG tablet Place 1 tablet (20 mg total) into feeding tube daily. 30 tablet 0   Multiple Vitamin (MULTIVITAMIN WITH MINERALS) TABS tablet Place 1 tablet into feeding tube daily. 30 tablet 0   rosuvastatin (CRESTOR) 20 MG tablet Place 1 tablet (20 mg total) into feeding tube daily. 30 tablet 0   vitamin B-12 (VITAMIN B12) 500 MCG tablet Place 1 tablet (500 mcg total) into feeding tube daily. 30 tablet 0   No current facility-administered medications on file prior to visit.    Allergies:   Allergies  Allergen Reactions   Clarithromycin Hives and Rash    Physical Exam General: well developed, well nourished, seated, in no evident distress Head: head normocephalic and atraumatic.  Neck: supple with no carotid or supraclavicular bruits Cardiovascular: regular rate and rhythm, no murmurs Musculoskeletal: no deformity Skin:  no rash/petichiae Vascular:  Normal pulses all extremities Vitals:   04/16/22 1405  BP: 114/76  Pulse: 83   Neurologic Exam Mental Status: Awake and fully alert. Oriented to place and time. Recent and remote memory intact. Attention span, concentration and fund of knowledge appropriate. Mood and affect appropriate.  Mild expressive aphasia with nonfluent speech and occasional word hesitancy.  No paraphasic errors.  Good naming and  repetition.  Good comprehension. Cranial Nerves: Fundoscopic exam reveals sharp disc margins. Pupils equal, briskly reactive to light. Extraocular movements full without nystagmus. Visual fields full to confrontation. Hearing intact. Facial sensation intact.  Mild right lower tongue, palate moves normally and symmetrically.  Motor: No right upper extremity drift but diminished fine Preminger movements on the right and orbits left or right upper extremity.  Trace right grip weakness.  Right lower extremity 3/5 strength proximally at the hip and 2/5 at the knee.  0/5 the right ankle with right foot drop.  Mild right lower extremity drift. Sensory.: intact to touch ,pinprick .position and vibratory sensation.  Coordination: Rapid alternating movements normal in all extremities. Finger-to-nose and heel-to-shin performed accurately bilaterally. Gait and Station: Deferred as patient is unable to walk and did not  bring her walker. Reflexes: 2+ and asymmetric and brisker on the right.. Toes downgoing.   NIHSS  3 Modified Rankin  3   ASSESSMENT: 52 year old Caucasian lady with left MCA and bilateral ACA infarcts due to left terminal ICA, M1 and A2/P3 occlusion treated with mechanical thrombectomy with history of proximal left ICA carotid stent August 2023 likely from large vessels atherosclerosis.  Patient is doing well with only mild residual aphasia and right leg weakness vascular risk factors of vessels of the stenosis, hyperlipidemia, obesity,carotid stenosis and marijuana use   PLAN:I had a long d/w patient and her son about his recent stroke, left carotid stent risk for recurrent stroke/TIAs, personally independently reviewed imaging studies and stroke evaluation results and answered questions.Continue aspirin 81 mg daily alone and discontinue Brilinta now since it has been 8 months since the carotid stent for secondary stroke prevention and maintain strict control of hypertension with blood pressure  goal below 130/90, diabetes with hemoglobin A1c goal below 6.5% and lipids with LDL cholesterol goal below 70 mg/dL. I also advised the patient to eat a healthy diet with plenty of whole grains, cereals, fruits and vegetables, exercise regularly and maintain ideal body weight continue home visit to occupational therapy and she may then benefit later with outpatient therapies.  Continue 6 monthly carotid ultrasound for stent surveillance.  Followup in the future with my nurse practitioner in 6 months or call earlier if necessary. Greater than 50% of time during this 40 minute visit was spent on counseling,explanation of diagnosis carotid occlusion and stent, planning of further management, discussion with patient and family and coordination of care  Antony Contras, MD Note: This document was prepared with digital dictation and possible smart phrase technology. Any transcriptional errors that result from this process are unintentional

## 2022-04-16 NOTE — Patient Instructions (Signed)
I had a long d/w patient and her son about his recent stroke, left carotid stent risk for recurrent stroke/TIAs, personally independently reviewed imaging studies and stroke evaluation results and answered questions.Continue aspirin 81 mg daily alone and discontinue Brilinta now since it has been 8 months since the carotid stent for secondary stroke prevention and maintain strict control of hypertension with blood pressure goal below 130/90, diabetes with hemoglobin A1c goal below 6.5% and lipids with LDL cholesterol goal below 70 mg/dL. I also advised the patient to eat a healthy diet with plenty of whole grains, cereals, fruits and vegetables, exercise regularly and maintain ideal body weight continue home visit to occupational therapy and she may then benefit later with outpatient therapies.  Continue 6 monthly carotid ultrasound for stent surveillance.  Followup in the future with my nurse practitioner in 6 months or call earlier if necessary.  Stroke Prevention Some medical conditions and behaviors can lead to a higher chance of having a stroke. You can help prevent a stroke by eating healthy, exercising, not smoking, and managing any medical conditions you have. Stroke is a leading cause of functional impairment. Primary prevention is particularly important because a majority of strokes are first-time events. Stroke changes the lives of not only those who experience a stroke but also their family and other caregivers. How can this condition affect me? A stroke is a medical emergency and should be treated right away. A stroke can lead to brain damage and can sometimes be life-threatening. If a person gets medical treatment right away, there is a better chance of surviving and recovering from a stroke. What can increase my risk? The following medical conditions may increase your risk of a stroke: Cardiovascular disease. High blood pressure (hypertension). Diabetes. High cholesterol. Sickle cell  disease. Blood clotting disorders (hypercoagulable state). Obesity. Sleep disorders (obstructive sleep apnea). Other risk factors include: Being older than age 28. Having a history of blood clots, stroke, or mini-stroke (transient ischemic attack, TIA). Genetic factors, such as race, ethnicity, or a family history of stroke. Smoking cigarettes or using other tobacco products. Taking birth control pills, especially if you also use tobacco. Heavy use of alcohol or drugs, especially cocaine and methamphetamine. Physical inactivity. What actions can I take to prevent this? Manage your health conditions High cholesterol levels. Eating a healthy diet is important for preventing high cholesterol. If cholesterol cannot be managed through diet alone, you may need to take medicines. Take any prescribed medicines to control your cholesterol as told by your health care provider. Hypertension. To reduce your risk of stroke, try to keep your blood pressure below 130/80. Eating a healthy diet and exercising regularly are important for controlling blood pressure. If these steps are not enough to manage your blood pressure, you may need to take medicines. Take any prescribed medicines to control hypertension as told by your health care provider. Ask your health care provider if you should monitor your blood pressure at home. Have your blood pressure checked every year, even if your blood pressure is normal. Blood pressure increases with age and some medical conditions. Diabetes. Eating a healthy diet and exercising regularly are important parts of managing your blood sugar (glucose). If your blood sugar cannot be managed through diet and exercise, you may need to take medicines. Take any prescribed medicines to control your diabetes as told by your health care provider. Get evaluated for obstructive sleep apnea. Talk to your health care provider about getting a sleep evaluation if you snore a  lot or have  excessive sleepiness. Make sure that any other medical conditions you have, such as atrial fibrillation or atherosclerosis, are managed. Nutrition Follow instructions from your health care provider about what to eat or drink to help manage your health condition. These instructions may include: Reducing your daily calorie intake. Limiting how much salt (sodium) you use to 1,500 milligrams (mg) each day. Using only healthy fats for cooking, such as olive oil, canola oil, or sunflower oil. Eating healthy foods. You can do this by: Choosing foods that are high in fiber, such as whole grains, and fresh fruits and vegetables. Eating at least 5 servings of fruits and vegetables a day. Try to fill one-half of your plate with fruits and vegetables at each meal. Choosing lean protein foods, such as lean cuts of meat, poultry without skin, fish, tofu, beans, and nuts. Eating low-fat dairy products. Avoiding foods that are high in sodium. This can help lower blood pressure. Avoiding foods that have saturated fat, trans fat, and cholesterol. This can help prevent high cholesterol. Avoiding processed and prepared foods. Counting your daily carbohydrate intake.  Lifestyle If you drink alcohol: Limit how much you have to: 0-1 drink a day for women who are not pregnant. 0-2 drinks a day for men. Know how much alcohol is in your drink. In the [place] one drink equals one 12 oz bottle of beer (384m), one 5 oz glass of wine (1478m, or one 1 oz glass of hard liquor (4414m Do not use any products that contain nicotine or tobacco. These products include cigarettes, chewing tobacco, and vaping devices, such as e-cigarettes. If you need help quitting, ask your health care provider. Avoid secondhand smoke. Do not use drugs. Activity  Try to stay at a healthy weight. Get at least 30 minutes of exercise on most days, such as: Fast walking. Biking. Swimming. Medicines Take over-the-counter and prescription  medicines only as told by your health care provider. Aspirin or blood thinners (antiplatelets or anticoagulants) may be recommended to reduce your risk of forming blood clots that can lead to stroke. Avoid taking birth control pills. Talk to your health care provider about the risks of taking birth control pills if: You are over 35 40ars old. You smoke. You get very bad headaches. You have had a blood clot. Where to find more information American Stroke Association: www.strokeassociation.org Get help right away if: You or a loved one has any symptoms of a stroke. "BE FAST" is an easy way to remember the main warning signs of a stroke: B - Balance. Signs are dizziness, sudden trouble walking, or loss of balance. E - Eyes. Signs are trouble seeing or a sudden change in vision. F - Face. Signs are sudden weakness or numbness of the face, or the face or eyelid drooping on one side. A - Arms. Signs are weakness or numbness in an arm. This happens suddenly and usually on one side of the body. S - Speech. Signs are sudden trouble speaking, slurred speech, or trouble understanding what people say. T - Time. Time to call emergency services. Write down what time symptoms started. You or a loved one has other signs of a stroke, such as: A sudden, severe headache with no known cause. Nausea or vomiting. Seizure. These symptoms may represent a serious problem that is an emergency. Do not wait to see if the symptoms will go away. Get medical help right away. Call your local emergency services (911 in the [place] Do not drive yourself  to the hospital. Summary You can help to prevent a stroke by eating healthy, exercising, not smoking, limiting alcohol intake, and managing any medical conditions you may have. Do not use any products that contain nicotine or tobacco. These include cigarettes, chewing tobacco, and vaping devices, such as e-cigarettes. If you need help quitting, ask your health care  provider. Remember "BE FAST" for warning signs of a stroke. Get help right away if you or a loved one has any of these signs. This information is not intended to replace advice given to you by your health care provider. Make sure you discuss any questions you have with your health care provider. Document Revised: 07/29/2019 Document Reviewed: 08/16/2019 Elsevier Patient Education  [place].

## 2022-04-18 NOTE — Progress Notes (Signed)
Carelink Summary Report / Loop Recorder 

## 2022-04-21 LAB — CUP PACEART REMOTE DEVICE CHECK
Date Time Interrogation Session: 20240324231221
Implantable Pulse Generator Implant Date: 20240115

## 2022-04-22 ENCOUNTER — Ambulatory Visit (INDEPENDENT_AMBULATORY_CARE_PROVIDER_SITE_OTHER): Payer: 59

## 2022-04-22 DIAGNOSIS — I63512 Cerebral infarction due to unspecified occlusion or stenosis of left middle cerebral artery: Secondary | ICD-10-CM | POA: Diagnosis not present

## 2022-05-03 ENCOUNTER — Other Ambulatory Visit: Payer: Self-pay | Admitting: Internal Medicine

## 2022-05-03 DIAGNOSIS — Z1231 Encounter for screening mammogram for malignant neoplasm of breast: Secondary | ICD-10-CM

## 2022-05-10 ENCOUNTER — Ambulatory Visit
Admission: RE | Admit: 2022-05-10 | Discharge: 2022-05-10 | Disposition: A | Discharge status: Home / Self Care | Payer: 59 | Source: Ambulatory Visit | Attending: Internal Medicine | Admitting: Internal Medicine

## 2022-05-10 DIAGNOSIS — Z1231 Encounter for screening mammogram for malignant neoplasm of breast: Secondary | ICD-10-CM

## 2022-05-27 ENCOUNTER — Ambulatory Visit (INDEPENDENT_AMBULATORY_CARE_PROVIDER_SITE_OTHER): Payer: 59

## 2022-05-27 DIAGNOSIS — I63512 Cerebral infarction due to unspecified occlusion or stenosis of left middle cerebral artery: Secondary | ICD-10-CM | POA: Diagnosis not present

## 2022-05-28 LAB — CUP PACEART REMOTE DEVICE CHECK
Date Time Interrogation Session: 20240426230225
Implantable Pulse Generator Implant Date: 20240115

## 2022-06-03 NOTE — Progress Notes (Signed)
Carelink Summary Report / Loop Recorder 

## 2022-06-27 NOTE — Progress Notes (Signed)
Carelink Summary Report / Loop Recorder 

## 2022-07-01 ENCOUNTER — Ambulatory Visit (INDEPENDENT_AMBULATORY_CARE_PROVIDER_SITE_OTHER): Payer: 59

## 2022-07-01 DIAGNOSIS — I63512 Cerebral infarction due to unspecified occlusion or stenosis of left middle cerebral artery: Secondary | ICD-10-CM

## 2022-07-01 LAB — CUP PACEART REMOTE DEVICE CHECK
Date Time Interrogation Session: 20240602230216
Implantable Pulse Generator Implant Date: 20240115

## 2022-07-23 NOTE — Progress Notes (Signed)
Carelink Summary Report / Loop Recorder 

## 2022-08-05 ENCOUNTER — Ambulatory Visit (INDEPENDENT_AMBULATORY_CARE_PROVIDER_SITE_OTHER): Payer: 59

## 2022-08-05 DIAGNOSIS — I63512 Cerebral infarction due to unspecified occlusion or stenosis of left middle cerebral artery: Secondary | ICD-10-CM | POA: Diagnosis not present

## 2022-08-05 LAB — CUP PACEART REMOTE DEVICE CHECK
Date Time Interrogation Session: 20240705230402
Implantable Pulse Generator Implant Date: 20240115

## 2022-08-22 NOTE — Progress Notes (Signed)
Carelink Summary Report / Loop Recorder 

## 2022-08-27 ENCOUNTER — Other Ambulatory Visit (HOSPITAL_COMMUNITY): Payer: Self-pay | Admitting: Interventional Radiology

## 2022-08-27 DIAGNOSIS — I63512 Cerebral infarction due to unspecified occlusion or stenosis of left middle cerebral artery: Secondary | ICD-10-CM

## 2022-09-06 ENCOUNTER — Ambulatory Visit (HOSPITAL_COMMUNITY)
Admission: RE | Admit: 2022-09-06 | Discharge: 2022-09-06 | Disposition: A | Discharge status: Home / Self Care | Payer: 59 | Source: Ambulatory Visit | Attending: Interventional Radiology | Admitting: Interventional Radiology

## 2022-09-06 DIAGNOSIS — I63512 Cerebral infarction due to unspecified occlusion or stenosis of left middle cerebral artery: Secondary | ICD-10-CM | POA: Diagnosis not present

## 2022-09-06 NOTE — Progress Notes (Signed)
Carotid duplex completed. Please see CV Procedures for preliminary results.  Shona Simpson, RVT 09/06/22 10:32 AM

## 2022-09-09 ENCOUNTER — Ambulatory Visit (INDEPENDENT_AMBULATORY_CARE_PROVIDER_SITE_OTHER): Payer: 59

## 2022-09-09 DIAGNOSIS — I63512 Cerebral infarction due to unspecified occlusion or stenosis of left middle cerebral artery: Secondary | ICD-10-CM

## 2022-09-10 ENCOUNTER — Telehealth (HOSPITAL_COMMUNITY): Payer: Self-pay

## 2022-09-10 NOTE — Telephone Encounter (Signed)
Pt's son agreed to f/u in 6 months with a ultrasound carotid. AB

## 2022-09-23 NOTE — Progress Notes (Signed)
Carelink Summary Report / Loop Recorder 

## 2022-10-14 ENCOUNTER — Ambulatory Visit (INDEPENDENT_AMBULATORY_CARE_PROVIDER_SITE_OTHER): Payer: 59

## 2022-10-14 DIAGNOSIS — I63512 Cerebral infarction due to unspecified occlusion or stenosis of left middle cerebral artery: Secondary | ICD-10-CM | POA: Diagnosis not present

## 2022-10-15 LAB — CUP PACEART REMOTE DEVICE CHECK
Date Time Interrogation Session: 20240913230558
Implantable Pulse Generator Implant Date: 20240115

## 2022-10-16 NOTE — Progress Notes (Unsigned)
[facility] [address] [place]. [place] [address] [phone]       OFFICE FOLLOW UP NOTE  Ms. SHEILY KANAAN Date of Birth:  Oct 30, 1970 Medical Record Number:  [id number]     Reason for visit: Stroke follow-up    SUBJECTIVE:   CHIEF COMPLAINT:  No chief complaint on file.   HPI:   Update 10/17/2022 JM: Returns for stroke follow-up.  Stable from a stroke standpoint without any new stroke/TIA symptoms.  Reports residual word-finding difficulty and RLE weakness.  Remains on aspirin, Crestor and Zetia.  Routinely follows with PCP for stroke risk factor management.  Carotid ultrasound in August showed patent left ICA stent.  Loop recorder has not shown atrial fibrillation thus far.      History provided for reference purposes only Update 04/16/2022 Dr. Pearlean Brownie: Ms. Gilman Buttner is a 52 year old pleasant Caucasian lady seen today for office follow-up visit following hospital admission for stroke in August 2023.  She is accompanied by her son.  This is obtained from them and review of electronic medical records and personally reviewed pertinent available imaging films in PACS.  She has history of hypertension, obesity, arthritis, polycystic ovarian syndrome.  She fell down on 09/19/2021 4 in the morning and head on admission showed dense left MCA sign spectrogram.  CT angiogram showed emergent large vessel occlusion of the left ICA origin short segment intracranial reconstitution patient underwent emergent mechanical thrombectomy with TICI2b revascularization left M2 andTICI2c revascularization of the left great toe.   She required placement of rescue proximal left ICA stent for pre-existing 50 to 75% stenosis.  2D echo showed ejection fraction of 65 to 70%.  LDL cholesterol 137 mg percent.  Hemoglobin A1c was 5.8.  Urine drug screen was positive for marijuana.  He was started on dual antiplatelet therapy aspirin and Brilinta and transferred to inpatient rehab.  Patient  has been home since January.  She is doing well.  She is getting home physical occupational and speech therapy.  Her speech is a lot improved.  She is able to communicate quite well and only occasionally when tired.  As result/disease along blood.  Right upper extremity strength is also improving she can use the right hand very well.  She still has weakness in the right leg particularly from ankle down.  She is able to walk with the AFO with the help of physical therapist and does not walk on her own yet.  She is tolerating aspirin and Brilinta well with minor bruising but no bleeding.  Blood pressure is under good control today 114/76.  She was on Nuedexta for emotional lability but has not taken it for a while and does not complain of significant lability at present.  She is tolerating Crestor well without side effects. She was seen by Dr. Marjory Lies for follow-up on 12/05/2021 and has improved since then.   Consult visit 12/05/2021 Dr. Marjory Lies: 52 year old female here for evaluation of stroke follow-up.  History of hypertension, arthritis, obesity, polycystic ovarian syndrome, IBS.   Patient fell down on 09/25/2021 at 4 in the morning, found by son with right-sided weakness and aphasia.  She was last known normal around 8 PM the night before.  She was taken the hospital and code stroke was activated.  Patient was found to have left ICA occlusion at its origin.  She underwent mechanical thrombectomy and rescue left internal carotid artery stenting.   Patient transition to inpatient rehabilitation and now continues to work on recovery.  Tolerating medications.  She is eating a full p.o. diet.  Having some issues with mood lability and uncontrolled crying spells.        ROS:   14 system review of systems performed and negative with exception of ***  PMH:  Past Medical History:  Diagnosis Date   Anxiety    Arthritis    Cancer (HCC)    skin squamous cell arm   Depression    Fibromyalgia     GERD (gastroesophageal reflux disease)    Hypertension    IBS (irritable bowel syndrome)    Pneumonia    hx   Polycystic ovarian disease    Tendonitis    Tubular adenoma of colon 2018   multiple    PSH:  Past Surgical History:  Procedure Laterality Date   APPENDECTOMY     BACK SURGERY     BACK SURGERY  2010   rods put in   IR ANGIO INTRA EXTRACRAN SEL COM CAROTID INNOMINATE UNI R MOD SED  09/25/2021   IR CT HEAD LTD  09/25/2021   IR CT HEAD LTD  09/25/2021   IR INTRAVSC STENT CERV CAROTID W/O EMB-PROT MOD SED INC ANGIO  09/25/2021   IR PERCUTANEOUS ART THROMBECTOMY/INFUSION INTRACRANIAL INC DIAG ANGIO  09/25/2021   IR US GUIDE VASC ACCESS LEFT  09/25/2021   IR US GUIDE VASC ACCESS RIGHT  09/25/2021   KNEE ARTHROSCOPY Left 13   NEUROPLASTY / TRANSPOSITION MEDIAN NERVE AT CARPAL TUNNEL BILATERAL     OVARIAN CYST SURGERY  1983   RADIOLOGY WITH ANESTHESIA N/A 09/25/2021   Procedure: IR WITH ANESTHESIA;  Surgeon: Julieanne Cotton, MD;  Location: [facility] OR;  Service: Radiology;  Laterality: N/A;   ROTATOR CUFF REPAIR  2003,04   right, and elbow,left   TENDON RECONSTRUCTION Right 12/08/2012   Procedure: ELBOW TENDON RECONSTRUCTION/Right Lateral Epicondylar Debridement with Repair.;  Surgeon: Valeria Batman, MD;  Location: [facility] OR;  Service: Orthopedics;  Laterality: Right;  Right Lateral Epicondylar Debridement with Repair.   TRIGGER FINGER RELEASE Right 08    Social History:  Social History   Socioeconomic History   Marital status: Single    Spouse name: Not on file   Number of children: 1   Years of education: Not on file   Highest education level: Not on file  Occupational History   Occupation: disability  Tobacco Use   Smoking status: Every Day    Current packs/day: 0.75    Average packs/day: 0.8 packs/day for 20.0 years (15.0 ttl pk-yrs)    Types: Cigarettes   Smokeless tobacco: Never   Tobacco comments:    taking wellbutrin to help quitting  use vqpor cig now  Vaping Use    Vaping status: Never Used  Substance and Sexual Activity   Alcohol use: No   Drug use: No   Sexual activity: Not on file  Other Topics Concern   Not on file  Social History Narrative   Not on file   Social Determinants of Health   Financial Resource Strain: Not on file  Food Insecurity: Not on file  Transportation Needs: Not on file  Physical Activity: Not on file  Stress: Not on file  Social Connections: Not on file  Intimate Partner Violence: Not on file    Family History:  Family History  Problem Relation Age of Onset   Heart disease Brother    Arthritis Brother    Lung cancer Mother        meso   Arthritis Mother  Hyperlipidemia Brother    Arthritis Sister    Colon polyps Brother    Thyroid disease Father    Arthritis Father    Diabetes Maternal Grandmother    Colon cancer Neg Hx    Uterine cancer Neg Hx    Stomach cancer Neg Hx    Rectal cancer Neg Hx     Medications:   Current Outpatient Medications on File Prior to Visit  Medication Sig Dispense Refill   albuterol (PROVENTIL) (2.5 MG/3ML) 0.083% nebulizer solution Take 3 mLs (2.5 mg total) by nebulization every 4 (four) hours as needed for wheezing or shortness of breath. 75 mL 12   aspirin 81 MG chewable tablet Place 1 tablet (81 mg total) into feeding tube daily. 30 tablet 0   busPIRone (BUSPAR) 15 MG tablet Take 1 tablet (15 mg total) by mouth daily. 30 tablet 12   esomeprazole (NEXIUM) 40 MG capsule Take 1 capsule (40 mg total) by mouth 2 (two) times daily before a meal. (Patient taking differently: Take 40 mg by mouth daily.) 60 capsule 3   ezetimibe (ZETIA) 10 MG tablet Take 10 mg by mouth daily.     lisinopril (ZESTRIL) 20 MG tablet Place 1 tablet (20 mg total) into feeding tube daily. 30 tablet 0   Multiple Vitamin (MULTIVITAMIN WITH MINERALS) TABS tablet Place 1 tablet into feeding tube daily. 30 tablet 0   rosuvastatin (CRESTOR) 20 MG tablet Place 1 tablet (20 mg total) into feeding tube daily.  30 tablet 0   ticagrelor (BRILINTA) 90 MG TABS tablet Place 1 tablet (90 mg total) into feeding tube 2 (two) times daily. 60 tablet 0   vitamin B-12 (VITAMIN B12) 500 MCG tablet Place 1 tablet (500 mcg total) into feeding tube daily. 30 tablet 0   No current facility-administered medications on file prior to visit.    Allergies:   Allergies  Allergen Reactions   Clarithromycin Hives and Rash      OBJECTIVE:  Physical Exam  There were no vitals filed for this visit. There is no height or weight on file to calculate BMI. No results found.      No data to display           General: well developed, well nourished, seated, in no evident distress Head: head normocephalic and atraumatic.   Neck: supple with no carotid or supraclavicular bruits Cardiovascular: regular rate and rhythm, no murmurs Musculoskeletal: no deformity Skin:  no rash/petichiae Vascular:  Normal pulses all extremities   Neurologic Exam Mental Status: Awake and fully alert. Oriented to place and time. Recent and remote memory intact. Attention span, concentration and fund of knowledge appropriate. Mood and affect appropriate.  Cranial Nerves: Fundoscopic exam reveals sharp disc margins. Pupils equal, briskly reactive to light. Extraocular movements full without nystagmus. Visual fields full to confrontation. Hearing intact. Facial sensation intact. Face, tongue, palate moves normally and symmetrically.  Motor: Normal bulk and tone. Normal strength in all tested extremity muscles Sensory.: intact to touch , pinprick , position and vibratory sensation.  Coordination: Rapid alternating movements normal in all extremities. Finger-to-nose and heel-to-shin performed accurately bilaterally. Gait and Station: Arises from chair without difficulty. Stance is normal. Gait demonstrates normal stride length and balance with ***. Tandem walk and heel toe ***.  Reflexes: 1+ and symmetric. Toes downgoing.        ASSESSMENT: NORIKO PROSCH is a 52 y.o. year old female with a left MCA and bilateral PCA strokes in 08/2021 in setting of last  terminal ICA, M1 and A2/P3 occlusion s/p L ICA stent. Vascular risk factors include carotid stenosis, HLD, THC use and obesity     PLAN:  L MCA, b/l PCA strokes :  Residual deficit: ***.  Loop recorder has not shown atrial fibrillation thus far -continue monitor by cardiology Continue aspirin 81mg  daily and atorvastatin (Lipitor) and rosuvastatin (Crestor) for secondary stroke prevention.   Discussed secondary stroke prevention measures and importance of close PCP follow up for aggressive stroke risk factor management including BP goal<130/90, HLD with LDL goal<70 and DM with A1c.<7 .  I have gone over the pathophysiology of stroke, warning signs and symptoms, risk factors and their management in some detail with instructions to go to the closest emergency room for symptoms of concern. S/p L ICA stent: Followed by vascular surgery.  Recent carotid ultrasound patent stent.     Follow up in *** or call earlier if needed   CC:  [facility] provider: Dr. Pearlean Brownie PCP: Mitchell Heir, DO (Inactive)    I spent *** minutes of face-to-face and non-face-to-face time with patient.  This included previsit chart review, lab review, study review, order entry, electronic health record documentation, patient education and discussion regarding above diagnoses and treatment plan and answered all other questions to patient satisfaction   Ihor Austin, [facility]  [facility] [address] [place], [place] [address]  Phone [phone] Fax [phone] Note: This document was prepared with digital dictation and possible smart phrase technology. Any transcriptional errors that result from this process are unintentional.

## 2022-10-17 ENCOUNTER — Encounter: Payer: Self-pay | Admitting: Adult Health

## 2022-10-17 ENCOUNTER — Ambulatory Visit (INDEPENDENT_AMBULATORY_CARE_PROVIDER_SITE_OTHER): Payer: 59 | Admitting: Adult Health

## 2022-10-17 VITALS — BP 134/85 | HR 88 | Ht 64.0 in | Wt 256.0 lb

## 2022-10-17 DIAGNOSIS — G8190 Hemiplegia, unspecified affecting unspecified side: Secondary | ICD-10-CM | POA: Diagnosis not present

## 2022-10-17 DIAGNOSIS — I63413 Cerebral infarction due to embolism of bilateral middle cerebral arteries: Secondary | ICD-10-CM

## 2022-10-17 DIAGNOSIS — I69320 Aphasia following cerebral infarction: Secondary | ICD-10-CM

## 2022-10-17 DIAGNOSIS — E785 Hyperlipidemia, unspecified: Secondary | ICD-10-CM | POA: Diagnosis not present

## 2022-10-17 NOTE — Patient Instructions (Addendum)
Referrals placed to [facility] for physical, occupational and speech  Continue aspirin 81 mg daily, Crestor and Zetia for secondary stroke prevention  Continue to follow with vascular surgery as advised for monitoring of left carotid stent  Loop recorder will continue to be monitored by cardiology  Continue to follow up with PCP regarding blood pressure and cholesterol management  Maintain strict control of hypertension with blood pressure goal below 130/90 and cholesterol with LDL cholesterol (bad cholesterol) goal below 70 mg/dL.   Signs of a Stroke? Follow the BEFAST method:  Balance Watch for a sudden loss of balance, trouble with coordination or vertigo Eyes Is there a sudden loss of vision in one or both eyes? Or double vision?  Face: Ask the person to smile. Does one side of the face droop or is it numb?  Arms: Ask the person to raise both arms. Does one arm drift downward? Is there weakness or numbness of a leg? Speech: Ask the person to repeat a simple phrase. Does the speech sound slurred/strange? Is the person confused ? Time: If you observe any of these signs, call 911.     Follow up in 6 months or call earlier if needed      Thank you for coming to see [place] at [facility]. I hope we have been able to provide you high quality care today.  You may receive a patient satisfaction survey over the next few weeks. We would appreciate your feedback and comments so that we may continue to improve ourselves and the health of our patients.

## 2022-10-18 LAB — COMPREHENSIVE METABOLIC PANEL
ALT: 18 IU/L (ref 0–32)
AST: 14 IU/L (ref 0–40)
Albumin: 4.6 g/dL (ref 3.8–4.9)
Alkaline Phosphatase: 77 IU/L (ref 44–121)
BUN/Creatinine Ratio: 18 (ref 9–23)
BUN: 12 mg/dL (ref 6–24)
Bilirubin Total: 0.3 mg/dL (ref 0.0–1.2)
CO2: 23 mmol/L (ref 20–29)
Calcium: 9.7 mg/dL (ref 8.7–10.2)
Chloride: 106 mmol/L (ref 96–106)
Creatinine, Ser: 0.66 mg/dL (ref 0.57–1.00)
Globulin, Total: 2.4 g/dL (ref 1.5–4.5)
Glucose: 81 mg/dL (ref 70–99)
Potassium: 4.2 mmol/L (ref 3.5–5.2)
Sodium: 145 mmol/L — ABNORMAL HIGH (ref 134–144)
Total Protein: 7 g/dL (ref 6.0–8.5)
eGFR: 104 mL/min/{1.73_m2} (ref >59)

## 2022-10-18 LAB — LIPID PANEL
Chol/HDL Ratio: 2.5 ratio (ref 0.0–4.4)
Cholesterol, Total: 159 mg/dL (ref 100–199)
HDL: 64 mg/dL (ref >39)
LDL Chol Calc (NIH): 68 mg/dL (ref 0–99)
Triglycerides: 162 mg/dL — ABNORMAL HIGH (ref 0–149)
VLDL Cholesterol Cal: 27 mg/dL (ref 5–40)

## 2022-10-18 LAB — CBC WITH DIFFERENTIAL/PLATELET
Basophils Absolute: 0 10*3/uL (ref 0.0–0.2)
Basos: 0 %
EOS (ABSOLUTE): 0.2 10*3/uL (ref 0.0–0.4)
Eos: 3 %
Hematocrit: 41.4 % (ref 34.0–46.6)
Hemoglobin: 13.9 g/dL (ref 11.1–15.9)
Immature Grans (Abs): 0 10*3/uL (ref 0.0–0.1)
Immature Granulocytes: 0 %
Lymphocytes Absolute: 2.2 10*3/uL (ref 0.7–3.1)
Lymphs: 33 %
MCH: 30.3 pg (ref 26.6–33.0)
MCHC: 33.6 g/dL (ref 31.5–35.7)
MCV: 90 fL (ref 79–97)
Monocytes Absolute: 0.6 10*3/uL (ref 0.1–0.9)
Monocytes: 9 %
Neutrophils Absolute: 3.8 10*3/uL (ref 1.4–7.0)
Neutrophils: 55 %
Platelets: 293 10*3/uL (ref 150–450)
RBC: 4.59 x10E6/uL (ref 3.77–5.28)
RDW: 11.6 % — ABNORMAL LOW (ref 11.7–15.4)
WBC: 6.9 10*3/uL (ref 3.4–10.8)

## 2022-10-22 NOTE — Telephone Encounter (Signed)
PT/OT/Speech referrals have been sent.  [phone]  [phone] (FAX)

## 2022-10-28 NOTE — Progress Notes (Signed)
Carelink Summary Report / Loop Recorder 

## 2022-11-14 ENCOUNTER — Encounter: Payer: Self-pay | Admitting: Physician Assistant

## 2022-11-14 ENCOUNTER — Ambulatory Visit: Payer: 59 | Admitting: Physician Assistant

## 2022-11-14 VITALS — BP 123/86 | HR 85 | Temp 98.1°F | Ht 68.0 in | Wt 256.4 lb

## 2022-11-14 DIAGNOSIS — F419 Anxiety disorder, unspecified: Secondary | ICD-10-CM

## 2022-11-14 DIAGNOSIS — I10 Essential (primary) hypertension: Secondary | ICD-10-CM | POA: Diagnosis not present

## 2022-11-14 DIAGNOSIS — L304 Erythema intertrigo: Secondary | ICD-10-CM | POA: Diagnosis not present

## 2022-11-14 DIAGNOSIS — Z8673 Personal history of transient ischemic attack (TIA), and cerebral infarction without residual deficits: Secondary | ICD-10-CM

## 2022-11-14 DIAGNOSIS — Z1211 Encounter for screening for malignant neoplasm of colon: Secondary | ICD-10-CM

## 2022-11-14 DIAGNOSIS — E782 Mixed hyperlipidemia: Secondary | ICD-10-CM

## 2022-11-14 DIAGNOSIS — K219 Gastro-esophageal reflux disease without esophagitis: Secondary | ICD-10-CM

## 2022-11-14 MED ORDER — ESOMEPRAZOLE MAGNESIUM 40 MG PO CPDR
40.0000 mg | DELAYED_RELEASE_CAPSULE | Freq: Every day | ORAL | 1 refills | Status: DC
Start: 2022-11-14 — End: 2023-11-24

## 2022-11-14 MED ORDER — BUSPIRONE HCL 15 MG PO TABS
15.0000 mg | ORAL_TABLET | Freq: Two times a day (BID) | ORAL | 1 refills | Status: DC
Start: 2022-11-14 — End: 2022-11-15

## 2022-11-14 MED ORDER — EZETIMIBE 10 MG PO TABS: 10.0000 mg | ORAL_TABLET | Freq: Every day | ORAL | 3 refills | Status: DC

## 2022-11-14 MED ORDER — LISINOPRIL 20 MG PO TABS
20.0000 mg | ORAL_TABLET | Freq: Every day | ORAL | 1 refills | Status: DC
Start: 2022-11-14 — End: 2023-11-20

## 2022-11-14 MED ORDER — METHOCARBAMOL 500 MG PO TABS
1000.0000 mg | ORAL_TABLET | Freq: Every evening | ORAL | 1 refills | Status: DC
Start: 2022-11-14 — End: 2023-03-07

## 2022-11-14 MED ORDER — NYSTATIN 100000 UNIT/GM EX POWD: 1.0000 | Freq: Two times a day (BID) | CUTANEOUS | 1 refills | Status: DC

## 2022-11-14 MED ORDER — ROSUVASTATIN CALCIUM 20 MG PO TABS
20.0000 mg | ORAL_TABLET | Freq: Every day | ORAL | 1 refills | Status: DC
Start: 2022-11-14 — End: 2023-10-10

## 2022-11-14 NOTE — Assessment & Plan Note (Signed)
Controlled with nexium 40 mg daily

## 2022-11-14 NOTE — Assessment & Plan Note (Signed)
-  LDL at goal  -Continue rosuvastatin 20 mg and Zetia 10 mg for cholesterol management.  Slightly elevated triglycerides. Discussed the benefits of fish oil supplementation and dietary modifications.

## 2022-11-14 NOTE — Progress Notes (Signed)
New patient visit   Patient: Erica Mcdaniel   DOB: 1970/08/02   52 y.o. Female  MRN: [id number] Visit Date: 11/14/2022  Today's healthcare provider: Alfredia Ferguson, PA-C   Cc. New patient, establish care  Subjective    Erica Mcdaniel is a 52 y.o. female who presents today as a new patient to establish care.   Pt had CVA 08/2021, s/p thrombectomy and revascularization. A proximal left ICA stent was placed. She has documented residual word-finding difficulty and RLE weakness and spasticity. She ambulates w/ a cane and AFO brace. She has a loop recorder in place.  She follows with neurology and cardiology. She is also in speech therapy w/ [facility].  Discussed the use of AI scribe software for clinical note transcription with the patient, who gave verbal consent to proceed.  History of Present Illness   The patient presents to establish care with a new primary care office. She is accompanied by her son today. The patient reports she has been experiencing a rash in the abdominal area, which she has been treating with talc-free powder.        Past Medical History:  Diagnosis Date   Acute ischemic left MCA stroke (HCC) 09/25/2021   Anxiety    Arthritis    Cancer (HCC)    skin squamous cell arm   Depression    Fibromyalgia    GERD (gastroesophageal reflux disease)    Hypertension    IBS (irritable bowel syndrome)    Pneumonia    hx   Polycystic ovarian disease    Tendonitis    Tubular adenoma of colon 2018   multiple   Past Surgical History:  Procedure Laterality Date   APPENDECTOMY     BACK SURGERY     BACK SURGERY  2010   rods put in   IR ANGIO INTRA EXTRACRAN SEL COM CAROTID INNOMINATE UNI R MOD SED  09/25/2021   IR CT HEAD LTD  09/25/2021   IR CT HEAD LTD  09/25/2021   IR INTRAVSC STENT CERV CAROTID W/O EMB-PROT MOD SED INC ANGIO  09/25/2021   IR PERCUTANEOUS ART THROMBECTOMY/INFUSION INTRACRANIAL INC DIAG ANGIO  09/25/2021   IR US GUIDE VASC  ACCESS LEFT  09/25/2021   IR US GUIDE VASC ACCESS RIGHT  09/25/2021   KNEE ARTHROSCOPY Left 13   NEUROPLASTY / TRANSPOSITION MEDIAN NERVE AT CARPAL TUNNEL BILATERAL     OVARIAN CYST SURGERY  1983   RADIOLOGY WITH ANESTHESIA N/A 09/25/2021   Procedure: IR WITH ANESTHESIA;  Surgeon: Julieanne Cotton, MD;  Location: [facility] OR;  Service: Radiology;  Laterality: N/A;   ROTATOR CUFF REPAIR  2003,04   right, and elbow,left   TENDON RECONSTRUCTION Right 12/08/2012   Procedure: ELBOW TENDON RECONSTRUCTION/Right Lateral Epicondylar Debridement with Repair.;  Surgeon: Valeria Batman, MD;  Location: [facility] OR;  Service: Orthopedics;  Laterality: Right;  Right Lateral Epicondylar Debridement with Repair.   TRIGGER FINGER RELEASE Right 08   Family Status  Relation Name Status   Mother  Deceased   Father  Alive   Sister  Alive   Brother  (Not Specified)   Brother  Audiological scientist  (Not Specified)   MGM  Deceased   MGF  Deceased   PGM  Deceased   PGF  Deceased   Neg Hx  (Not Specified)  No partnership data on file   Family History  Problem Relation Age of Onset   Lung cancer Mother  meso   Arthritis Mother    Thyroid disease Father    Arthritis Father    Arthritis Sister    Heart disease Brother    Arthritis Brother    Colon polyps Brother    Hyperlipidemia Brother    Diabetes Maternal Grandmother    Diabetes Maternal Grandfather    Heart attack Maternal Grandfather    Colon cancer Neg Hx    Uterine cancer Neg Hx    Stomach cancer Neg Hx    Rectal cancer Neg Hx    Social History   Socioeconomic History   Marital status: Single    Spouse name: Not on file   Number of children: 1   Years of education: Not on file   Highest education level: Not on file  Occupational History   Occupation: disability  Tobacco Use   Smoking status: Former    Current packs/day: 0.75    Average packs/day: 0.8 packs/day for 20.0 years (15.0 ttl pk-yrs)    Types: Cigarettes   Smokeless tobacco:  Never   Tobacco comments:    taking wellbutrin to help quitting  use vqpor cig now  Vaping Use   Vaping status: Never Used  Substance and Sexual Activity   Alcohol use: No   Drug use: No   Sexual activity: Not on file  Other Topics Concern   Not on file  Social History Narrative   Not on file   Social Determinants of Health   Financial Resource Strain: Not on file  Food Insecurity: Not on file  Transportation Needs: Not on file  Physical Activity: Not on file  Stress: Not on file  Social Connections: Not on file   Outpatient Medications Prior to Visit  Medication Sig   aspirin 81 MG chewable tablet Place 1 tablet (81 mg total) into feeding tube daily.   Multiple Vitamin (MULTIVITAMIN WITH MINERALS) TABS tablet Place 1 tablet into feeding tube daily.   vitamin B-12 (VITAMIN B12) 500 MCG tablet Place 1 tablet (500 mcg total) into feeding tube daily.   [DISCONTINUED] busPIRone (BUSPAR) 15 MG tablet Take 1 tablet (15 mg total) by mouth daily.   [DISCONTINUED] esomeprazole (NEXIUM) 40 MG capsule Take 1 capsule (40 mg total) by mouth 2 (two) times daily before a meal. (Patient taking differently: Take 40 mg by mouth daily.)   [DISCONTINUED] ezetimibe (ZETIA) 10 MG tablet Take 10 mg by mouth daily.   [DISCONTINUED] lisinopril (ZESTRIL) 20 MG tablet Place 1 tablet (20 mg total) into feeding tube daily.   [DISCONTINUED] methocarbamol (ROBAXIN) 500 MG tablet Take 1,000 mg by mouth at bedtime.   [DISCONTINUED] rosuvastatin (CRESTOR) 20 MG tablet Place 1 tablet (20 mg total) into feeding tube daily.   No facility-administered medications prior to visit.   Allergies  Allergen Reactions   Clarithromycin Hives and Rash    Immunization History  Administered Date(s) Administered   Td 09/29/1998    Health Maintenance  Topic Date Due   Hepatitis C Screening  Never done   DTaP/Tdap/Td (2 - Tdap) 09/28/2008   Medicare Annual Wellness (AWV)  06/06/2016   Zoster Vaccines- Shingrix (1 of  2) Never done   Colonoscopy  02/21/2019   COVID-19 Vaccine (1 - 2023-24 season) Never done   INFLUENZA VACCINE  04/28/2023 (Originally 08/29/2022)   MAMMOGRAM  05/10/2023   Cervical Cancer Screening (HPV/Pap Cotest)  11/30/2025   HIV Screening  Completed   HPV VACCINES  Aged Out    Patient Care Team: Erica Ferguson, PA-C as  PCP - General (Physician Assistant) Mealor, Roberts Gaudy, MD as PCP - Electrophysiology (Cardiology)  Review of Systems  Constitutional:  Negative for fatigue and fever.  Respiratory:  Negative for cough and shortness of breath.   Cardiovascular:  Negative for chest pain and leg swelling.  Gastrointestinal:  Negative for abdominal pain.  Musculoskeletal:  Positive for arthralgias and gait problem.  Skin:  Positive for rash.  Neurological:  Positive for weakness. Negative for dizziness and headaches.        Objective    BP 123/86   Pulse 85   Temp 98.1 F (36.7 C)   Ht 5\' 8"  (1.727 m)   Wt 256 lb 6 oz (116.3 kg)   SpO2 97%   BMI 38.98 kg/m     Physical Exam Constitutional:      General: She is awake.     Appearance: She is well-developed.  HENT:     Head: Normocephalic.  Eyes:     Conjunctiva/sclera: Conjunctivae normal.  Cardiovascular:     Rate and Rhythm: Normal rate and regular rhythm.     Heart sounds: Normal heart sounds.  Pulmonary:     Effort: Pulmonary effort is normal.     Breath sounds: Normal breath sounds.  Skin:    General: Skin is warm.     Comments: Groin/lower abdominal fold there is an erythematous rash no skin breakdown  Neurological:     Mental Status: She is alert and oriented to person, place, and time.  Psychiatric:        Attention and Perception: Attention normal.        Mood and Affect: Mood normal.        Speech: Speech normal.        Behavior: Behavior is cooperative.     Depression Screen     No data to display         No results found for any visits on 11/14/22.  Assessment & Plan      Problem  List Items Addressed This Visit       Cardiovascular and Mediastinum   Primary hypertension    Well controlled  Continue lisinopril 20 mg daily Reviewed cmp F/u 44mo      Relevant Medications   rosuvastatin (CRESTOR) 20 MG tablet   ezetimibe (ZETIA) 10 MG tablet   lisinopril (ZESTRIL) 20 MG tablet     Digestive   GERD    Controlled with nexium 40 mg daily      Relevant Medications   esomeprazole (NEXIUM) 40 MG capsule     Musculoskeletal and Integument   Intertrigo    Rx nystatin powder today      Relevant Medications   nystatin (MYCOSTATIN/NYSTOP) powder     Other   Anxiety    Well controlled with buspar 15 mg BID      Relevant Medications   busPIRone (BUSPAR) 15 MG tablet   Hyperlipidemia    -LDL at goal  -Continue rosuvastatin 20 mg and Zetia 10 mg for cholesterol management.  Slightly elevated triglycerides. Discussed the benefits of fish oil supplementation and dietary modifications.      Relevant Medications   rosuvastatin (CRESTOR) 20 MG tablet   ezetimibe (ZETIA) 10 MG tablet   lisinopril (ZESTRIL) 20 MG tablet   History of CVA (cerebrovascular accident) - Primary    H/o CVA from left ICA s/p thrmobectomy and revascularization w/ ICA stent placement.  Residual word finding, short term memory changes, and RLE weakness and spasticity. Pt reports  residual RUE fine motor skills.   Pt is following with outpatient PT and speech therapy. Reports being d/c from OT during home therapy.   Pt takes asa 81 mg, statin, zetia, ace-I. Follows with neuro/cardio      Relevant Medications   rosuvastatin (CRESTOR) 20 MG tablet   ezetimibe (ZETIA) 10 MG tablet   methocarbamol (ROBAXIN) 500 MG tablet   Other Visit Diagnoses     Colon cancer screening       Relevant Orders   Ambulatory referral to Gastroenterology      Pt declines all vaccines today.  Return in about 6 months (around 05/15/2023) for chronic conditions.      Erica Ferguson, PA-C  [facility] [place] [facility] [phone] (phone) [phone] (fax)  [facility]

## 2022-11-14 NOTE — Assessment & Plan Note (Signed)
H/o CVA from left ICA s/p thrmobectomy and revascularization w/ ICA stent placement.  Residual word finding, short term memory changes, and RLE weakness and spasticity. Pt reports residual RUE fine motor skills.   Pt is following with outpatient PT and speech therapy. Reports being d/c from OT during home therapy.   Pt takes asa 81 mg, statin, zetia, ace-I. Follows with neuro/cardio

## 2022-11-14 NOTE — Assessment & Plan Note (Signed)
Well controlled with buspar 15 mg BID

## 2022-11-14 NOTE — Assessment & Plan Note (Signed)
Well controlled  Continue lisinopril 20 mg daily Reviewed cmp F/u 3mo

## 2022-11-14 NOTE — Assessment & Plan Note (Signed)
Rx nystatin powder today

## 2022-11-15 ENCOUNTER — Other Ambulatory Visit: Payer: Self-pay | Admitting: Physician Assistant

## 2022-11-15 DIAGNOSIS — F419 Anxiety disorder, unspecified: Secondary | ICD-10-CM

## 2022-11-15 MED ORDER — BUSPIRONE HCL 15 MG PO TABS
15.0000 mg | ORAL_TABLET | Freq: Two times a day (BID) | ORAL | 1 refills | Status: DC
Start: 2022-11-15 — End: 2023-08-28

## 2022-11-18 ENCOUNTER — Ambulatory Visit: Payer: 59 | Attending: Cardiovascular Disease

## 2022-11-18 DIAGNOSIS — I63512 Cerebral infarction due to unspecified occlusion or stenosis of left middle cerebral artery: Secondary | ICD-10-CM

## 2022-11-19 LAB — CUP PACEART REMOTE DEVICE CHECK
Date Time Interrogation Session: 20241020231623
Implantable Pulse Generator Implant Date: 20240115

## 2022-12-05 NOTE — Progress Notes (Signed)
Carelink Summary Report / Loop Recorder 

## 2022-12-16 ENCOUNTER — Encounter: Payer: Self-pay | Admitting: Adult Health

## 2022-12-23 ENCOUNTER — Ambulatory Visit (INDEPENDENT_AMBULATORY_CARE_PROVIDER_SITE_OTHER): Payer: 59

## 2022-12-23 DIAGNOSIS — I63512 Cerebral infarction due to unspecified occlusion or stenosis of left middle cerebral artery: Secondary | ICD-10-CM

## 2022-12-23 LAB — CUP PACEART REMOTE DEVICE CHECK
Date Time Interrogation Session: 20241122230532
Implantable Pulse Generator Implant Date: 20240115

## 2023-01-05 ENCOUNTER — Other Ambulatory Visit: Payer: Self-pay | Admitting: Physician Assistant

## 2023-01-05 DIAGNOSIS — L304 Erythema intertrigo: Secondary | ICD-10-CM

## 2023-01-20 NOTE — Progress Notes (Signed)
Carelink Summary Report / Loop Recorder 

## 2023-01-27 ENCOUNTER — Ambulatory Visit (INDEPENDENT_AMBULATORY_CARE_PROVIDER_SITE_OTHER): Payer: 59

## 2023-01-27 DIAGNOSIS — I63512 Cerebral infarction due to unspecified occlusion or stenosis of left middle cerebral artery: Secondary | ICD-10-CM

## 2023-01-28 LAB — CUP PACEART REMOTE DEVICE CHECK
Date Time Interrogation Session: 20241227231124
Implantable Pulse Generator Implant Date: 20240115

## 2023-02-10 ENCOUNTER — Encounter: Payer: Self-pay | Admitting: Adult Health

## 2023-02-10 MED ORDER — BACLOFEN 5 MG PO TABS: 5.0000 mg | ORAL_TABLET | Freq: Three times a day (TID) | ORAL | 1 refills | Status: DC | PRN

## 2023-02-10 NOTE — Telephone Encounter (Signed)
 Spoke to [employer] (son) (checked DPR) Son states pt needs new AFO brace informed pt since [facility] prescribed AFO brace please [facility] to reorder Son states pt has increased stiffness per jessica,NP please start baclofen  5mg  3x daily as needed Informed son of medication side effects and informed son  pt has to stop Robaxin  since baclofen  is added to medication regiment . Will send rx today for Baclofen  . Informed son to call PCP to make them aware that pt is stopping robaxin  since pcp prescribed to pt . Son expressed understanding and thanked me for calling  informed son please start baclofen  at  night and increase has needed . Son  expressed understanding

## 2023-03-03 ENCOUNTER — Ambulatory Visit (INDEPENDENT_AMBULATORY_CARE_PROVIDER_SITE_OTHER): Payer: 59

## 2023-03-03 DIAGNOSIS — I63512 Cerebral infarction due to unspecified occlusion or stenosis of left middle cerebral artery: Secondary | ICD-10-CM | POA: Diagnosis not present

## 2023-03-03 LAB — CUP PACEART REMOTE DEVICE CHECK
Date Time Interrogation Session: 20250131230912
Implantable Pulse Generator Implant Date: 20240115

## 2023-03-06 ENCOUNTER — Encounter: Payer: Self-pay | Admitting: Physician Assistant

## 2023-03-07 ENCOUNTER — Other Ambulatory Visit: Payer: Self-pay | Admitting: Physician Assistant

## 2023-03-12 ENCOUNTER — Encounter: Payer: Self-pay | Admitting: Cardiovascular Disease

## 2023-03-17 ENCOUNTER — Telehealth: Payer: Self-pay | Admitting: *Deleted

## 2023-03-17 NOTE — Telephone Encounter (Signed)
PA is needed for baclofen 5 mg tablet.

## 2023-03-18 ENCOUNTER — Other Ambulatory Visit (HOSPITAL_COMMUNITY): Payer: Self-pay

## 2023-03-18 ENCOUNTER — Telehealth: Payer: Self-pay

## 2023-03-18 NOTE — Telephone Encounter (Signed)
Pharmacy Patient Advocate Encounter   Received notification from Physician's Office that prior authorization for Baclofen 5MG  Tablet is required/requested.   Insurance verification completed.   The patient is insured through [employer] .   Per test claim: The current 30 day co-pay is, $0.  No PA needed at this time. This test claim was processed through [facility]- copay amounts may vary at other pharmacies due to pharmacy/plan contracts, or as the patient moves through the different stages of their insurance plan.

## 2023-04-02 MED ORDER — BACLOFEN 5 MG PO TABS: 5.0000 mg | ORAL_TABLET | Freq: Three times a day (TID) | ORAL | 1 refills | Status: AC | PRN

## 2023-04-02 NOTE — Telephone Encounter (Addendum)
 Spoke to Son (checked DPR)  Informed son Baclofen was sent to [facility] pharmacy this am and pt f/u appoinrment is 04/2023 with Shanda Bumps.NP son expressed understanding and thanked me for calling

## 2023-04-07 ENCOUNTER — Ambulatory Visit (INDEPENDENT_AMBULATORY_CARE_PROVIDER_SITE_OTHER): Payer: 59

## 2023-04-07 DIAGNOSIS — I63512 Cerebral infarction due to unspecified occlusion or stenosis of left middle cerebral artery: Secondary | ICD-10-CM

## 2023-04-07 LAB — CUP PACEART REMOTE DEVICE CHECK
Date Time Interrogation Session: 20250309231840
Implantable Pulse Generator Implant Date: 20240115

## 2023-04-09 NOTE — Progress Notes (Signed)
 Carelink Summary Report / Loop Recorder

## 2023-04-15 ENCOUNTER — Encounter: Payer: Self-pay | Admitting: Cardiovascular Disease

## 2023-04-27 NOTE — Progress Notes (Unsigned)
  Cardiology Office Note:  .   Date:  04/27/2023  ID:  Erica Mcdaniel, DOB 10/04/1970, MRN [id number] PCP: Alfredia Ferguson, PA-C  [facility] Providers Cardiologist:  None Electrophysiologist:  Maurice Small, MD {  History of Present Illness: .   Erica Mcdaniel is a 53 y.o. female w/PMHx of  HTN, fibromyalgia, GERD Stroke  Referred to EP after stroke for consideration of ILR Saw Dr. Nelly Laurence 02/11/22, noted bilateral strokes in September, 2023, suspicious for a proximal source, possibly atrial fibrillation.  Underwent ILR implant same day  Today's visit is scheduled as an over due annual visit  ROS:   Accompanied by her son She feels very well Denies any kind of CP, palpitatios or cardiac awarenss Ambulated with a brace Elower leg and a cane, but gets around well, no falls, remains active No SOB, DOE No near syncope or syncope  Sees her PMD regularly   Device information MDT ILR implanted 02/11/22 for cryptogenic stroke   Studies Reviewed: Marland Kitchen    EKG done today and reviewed by myself:  SR 82bpm initial EKG with poor R wave orogression a 2nd EKG was done assuring good lead placement > looked better   DEVICE interrogation transmission from today and reviewed by myself Battery is good False tachy episodes for noise only  09/25/21: TTE 1. Left ventricular ejection fraction, by estimation, is 65 to 70%. The  left ventricle has normal function. The left ventricle has no regional  wall motion abnormalities. There is mild left ventricular hypertrophy.  Left ventricular diastolic parameters  were normal.   2. Right ventricular systolic function is hyperdynamic. The right  ventricular size is normal. Tricuspid regurgitation signal is inadequate  for assessing PA pressure.   3. The mitral valve is normal in structure. No evidence of mitral valve  regurgitation.   4. The aortic valve was not well visualized. There is mild calcification  of the aortic valve.  Aortic valve regurgitation is not visualized. Aortic  valve sclerosis/calcification is present, without any evidence of aortic  stenosis.   5. The inferior vena cava is normal in size with greater than 50%  respiratory variability, suggesting right atrial pressure of 3 mmHg.   Comparison(s): No prior Echocardiogram.    Risk Assessment/Calculations:    Physical Exam:   VS:  There were no vitals taken for this visit.   Wt Readings from Last 3 Encounters:  11/14/22 256 lb 6 oz (116.3 kg)  10/17/22 256 lb (116.1 kg)  02/11/22 226 lb (102.5 kg)    GEN: Well nourished, well developed in no acute distress NECK: No JVD; No carotid bruits CARDIAC: RRR, no murmurs, rubs, gallops RESPIRATORY:  CTA b/l without rales, wheezing or rhonchi  ABDOMEN: Soft, non-tender, non-distended EXTREMITIES: No edema; No deformity   ILR site: is stable, no thinning, fluctuation, tethering  ASSESSMENT AND PLAN: .    ILR Cryptogenic stroke No AFib to date  HTN Looks good     Dispo: monthly remotes as usual, we can see her in clinic PRN, they are comfortable (very happy) with that.    Signed, Sheilah Pigeon, PA-C

## 2023-04-30 ENCOUNTER — Ambulatory Visit: Attending: Physician Assistant | Admitting: Physician Assistant

## 2023-04-30 ENCOUNTER — Encounter: Payer: Self-pay | Admitting: Physician Assistant

## 2023-04-30 VITALS — BP 134/82 | HR 82 | Ht 68.0 in | Wt 258.2 lb

## 2023-04-30 DIAGNOSIS — Z95818 Presence of other cardiac implants and grafts: Secondary | ICD-10-CM | POA: Diagnosis not present

## 2023-04-30 DIAGNOSIS — I10 Essential (primary) hypertension: Secondary | ICD-10-CM

## 2023-04-30 DIAGNOSIS — I639 Cerebral infarction, unspecified: Secondary | ICD-10-CM | POA: Diagnosis not present

## 2023-04-30 NOTE — Patient Instructions (Signed)
 Medication Instructions:   Your physician recommends that you continue on your current medications as directed. Please refer to the Current Medication list given to you today.  *If you need a refill on your cardiac medications before your next appointment, please call your pharmacy*  Lab Work: NONE ORDERED  TODAY   If you have labs (blood work) drawn today and your tests are completely normal, you will receive your results only by: MyChart Message (if you have MyChart) OR A paper copy in the mail If you have any lab test that is abnormal or we need to change your treatment, we will call you to review the results.  Testing/Procedures: NONE ORDERED  TODAY    Follow-Up: At [facility], you and your health needs are our priority.  As part of our continuing mission to provide you with exceptional heart care, our providers are all part of one team.  This team includes your primary Cardiologist (physician) and Advanced Practice Providers or APPs (Physician Assistants and Nurse Practitioners) who all work together to provide you with the care you need, when you need it.  Your next appointment:  CONTACT [facility] [phone] AS NEEDED FOR  ANY CARDIAC RELATED SYMPTOMS  We recommend signing up for the patient portal called "MyChart".  Sign up information is provided on this After Visit Summary.  MyChart is used to connect with patients for Virtual Visits (Telemedicine).  Patients are able to view lab/test results, encounter notes, upcoming appointments, etc.  Non-urgent messages can be sent to your provider as well.   To learn more about what you can do with MyChart, go to [email or web address].   Other Instructions       1st Floor: - Lobby - Registration  - Pharmacy  - Lab - Cafe  2nd Floor: - PV Lab - Diagnostic Testing (echo, CT, nuclear med)  3rd Floor: - Vacant  4th Floor: - TCTS (cardiothoracic surgery) - AFib Clinic - Structural Heart Clinic - Vascular  Surgery  - Vascular Ultrasound  5th Floor: - HeartCare Cardiology (general and EP) - Clinical Pharmacy for coumadin, hypertension, lipid, weight-loss medications, and med management appointments    Valet parking services will be available as well.

## 2023-05-12 ENCOUNTER — Ambulatory Visit (INDEPENDENT_AMBULATORY_CARE_PROVIDER_SITE_OTHER): Payer: 59

## 2023-05-12 DIAGNOSIS — I639 Cerebral infarction, unspecified: Secondary | ICD-10-CM | POA: Diagnosis not present

## 2023-05-12 LAB — CUP PACEART REMOTE DEVICE CHECK
Date Time Interrogation Session: 20250413231812
Implantable Pulse Generator Implant Date: 20240115

## 2023-05-12 NOTE — Progress Notes (Signed)
 Carelink Summary Report / Loop Recorder

## 2023-05-14 ENCOUNTER — Encounter: Payer: Self-pay | Admitting: Adult Health

## 2023-05-15 ENCOUNTER — Ambulatory Visit: Payer: 59 | Admitting: Adult Health

## 2023-05-16 ENCOUNTER — Ambulatory Visit: Payer: 59 | Admitting: Physician Assistant

## 2023-05-23 ENCOUNTER — Ambulatory Visit: Payer: 59 | Admitting: Physician Assistant

## 2023-05-24 ENCOUNTER — Other Ambulatory Visit: Payer: Self-pay | Admitting: Adult Health

## 2023-05-26 NOTE — Telephone Encounter (Signed)
 Last seen on 10/17/22 No follow 6 month follow up scheduled   Per 05/14/23 my chart message " If she is doing well since the last visit and no specific questions or concerns and is closely being followed by her PCP for stroke risk factor management, it is okay to cancel the visit and just follow-up as needed. I am currently prescribing her baclofen  but this prescription can be maintained through her PCP. Please let me know if you would like to go ahead and cancel the visit.    Camilo Cella, NP

## 2023-05-27 ENCOUNTER — Encounter: Payer: Self-pay | Admitting: Cardiovascular Disease

## 2023-05-30 ENCOUNTER — Encounter: Payer: Self-pay | Admitting: Physician Assistant

## 2023-05-30 ENCOUNTER — Ambulatory Visit (INDEPENDENT_AMBULATORY_CARE_PROVIDER_SITE_OTHER): Admitting: Physician Assistant

## 2023-05-30 VITALS — BP 142/88 | HR 89 | Temp 98.2°F | Resp 18 | Ht 68.0 in | Wt 255.2 lb

## 2023-05-30 DIAGNOSIS — I10 Essential (primary) hypertension: Secondary | ICD-10-CM | POA: Diagnosis not present

## 2023-05-30 DIAGNOSIS — Z8673 Personal history of transient ischemic attack (TIA), and cerebral infarction without residual deficits: Secondary | ICD-10-CM

## 2023-05-30 DIAGNOSIS — F419 Anxiety disorder, unspecified: Secondary | ICD-10-CM

## 2023-05-30 DIAGNOSIS — F32A Depression, unspecified: Secondary | ICD-10-CM | POA: Diagnosis not present

## 2023-05-30 DIAGNOSIS — R739 Hyperglycemia, unspecified: Secondary | ICD-10-CM | POA: Diagnosis not present

## 2023-05-30 DIAGNOSIS — B351 Tinea unguium: Secondary | ICD-10-CM | POA: Diagnosis not present

## 2023-05-30 MED ORDER — ESCITALOPRAM OXALATE 10 MG PO TABS: 10.0000 mg | ORAL_TABLET | Freq: Every day | ORAL | 1 refills | Status: AC

## 2023-05-30 NOTE — Assessment & Plan Note (Signed)
 Anxiety and depression have worsened since stopping physical therapy, leading to decreased activity and social interaction. Emphasized the importance of establishing a routine and engaging in social activities to improve mental health. Cont buspar  15 mg bid. Adding lexapro 10 mg .Discussed potential side effects of Lexapro, including fatigue, nausea Referring to psychology. F/u 4-6 weeks

## 2023-05-30 NOTE — Progress Notes (Signed)
 Established patient visit   Patient: Erica Mcdaniel   DOB: [birth year]   53 y.o. Female  MRN: [id number] Visit Date: 05/30/2023  Today's healthcare provider: Trenton Frock, PA-C   Cc. Chronic condition f/u  Subjective     Pt, with a history of a CVA with residual word finding difficulty, RLE weakness, presents for a chronic care follow up.   Pt has been discharged from PT, and only follows up as needed with cardiology.   Discussed the use of AI scribe software for clinical note transcription with the patient, who gave verbal consent to proceed.  History of Present Illness   Erica D Wands "Josie" is a 53 year old female with a history of stroke who presents with toenail issues and worsening anxiety and depression.  She has a longstanding issue with her right big toe's nail, improperly growing for four to five years. The toenail has been shaved down in the past but grew back improperly. There is no pain due to her stroke, but she experiences discomfort from shoe rubbing, and the toe sometimes becomes red. The issue is isolated to this toe  Her anxiety and depression have worsened since stopping physical therapy, which provided routine and social interaction. She feels isolated at home due to her children's work schedules. Buspar , previously effective for anxiety, now feels insufficient. She seeks more support and routine in her daily life.  She uses a cane for walking and can walk for extended periods at a slower pace. Fatigue occurs after activities like physical therapy or walking in stores, limiting her ability to engage in activities outside the home.  Her blood pressure is stable at home, typically ranging from 125 to 135 systolic, but was elevated during today's visit.       Medications: Outpatient Medications Prior to Visit  Medication Sig   aspirin  81 MG chewable tablet Place 1 tablet (81 mg total) into feeding tube daily.   Baclofen  5 MG TABS Take 1 tablet  (5 mg total) by mouth 3 (three) times daily as needed.   busPIRone  (BUSPAR ) 15 MG tablet Take 1 tablet (15 mg total) by mouth 2 (two) times daily.   esomeprazole  (NEXIUM ) 40 MG capsule Take 1 capsule (40 mg total) by mouth daily.   ezetimibe  (ZETIA ) 10 MG tablet Take 1 tablet (10 mg total) by mouth daily.   lisinopril  (ZESTRIL ) 20 MG tablet Take 1 tablet (20 mg total) by mouth daily.   nystatin  (MYCOSTATIN /NYSTOP ) powder APPLY  POWDER TOPICALLY TWICE DAILY   rosuvastatin  (CRESTOR ) 20 MG tablet Take 1 tablet (20 mg total) by mouth daily.   No facility-administered medications prior to visit.    Review of Systems  Constitutional:  Negative for fatigue and fever.  Respiratory:  Negative for cough and shortness of breath.   Cardiovascular:  Negative for chest pain and leg swelling.  Gastrointestinal:  Negative for abdominal pain.  Neurological:  Negative for dizziness and headaches.  Psychiatric/Behavioral:  The patient is nervous/anxious.        Objective    BP (!) 142/88   Pulse 89   Temp 98.2 F (36.8 C) (Oral)   Resp 18   Ht 5\' 8"  (1.727 m)   Wt 255 lb 3.2 oz (115.8 kg)   SpO2 96%   BMI 38.80 kg/m    Physical Exam Constitutional:      General: She is awake.     Appearance: She is well-developed.  HENT:     Head:  Normocephalic.  Eyes:     Conjunctiva/sclera: Conjunctivae normal.  Cardiovascular:     Rate and Rhythm: Normal rate and regular rhythm.     Heart sounds: Normal heart sounds.  Pulmonary:     Effort: Pulmonary effort is normal.     Breath sounds: Normal breath sounds.  Feet:     Comments: Right great toe nail is very thick, deformed. No signs of erythema, edema ,infection Skin:    General: Skin is warm.  Neurological:     Mental Status: She is alert and oriented to person, place, and time.  Psychiatric:        Attention and Perception: Attention normal.        Mood and Affect: Mood normal.        Speech: Speech normal.        Behavior: Behavior is  cooperative.    No results found for any visits on 05/30/23.  Assessment & Plan    Anxiety and depression Assessment & Plan: Anxiety and depression have worsened since stopping physical therapy, leading to decreased activity and social interaction. Emphasized the importance of establishing a routine and engaging in social activities to improve mental health. Cont buspar  15 mg bid. Adding lexapro 10 mg .Discussed potential side effects of Lexapro, including fatigue, nausea Referring to psychology. F/u 4-6 weeks   Orders: -     Escitalopram Oxalate; Take 1 tablet (10 mg total) by mouth daily.  Dispense: 90 tablet; Refill: 1 -     Ambulatory referral to Psychology  Primary hypertension Assessment & Plan: Chronic, elevated today. Continue lisinopri l20 mg ,ordering cmp F/b 4-6 weeks, cont to monitor at home  Orders: -     Comprehensive metabolic panel with GFR -     CBC with Differential/Platelet  History of CVA (cerebrovascular accident) Assessment & Plan: Residual word finding, short term memory changes, and RLE weakness and spasticity.  Graduated from PT, speech.  Follows with neuro, cardiology prn.  Manages with asa 81 mg, statin, zetia , ace-I. Muscle relaxants for spasticity.   Orders: -     Comprehensive metabolic panel with GFR -     CBC with Differential/Platelet  Onychomycosis -     Ambulatory referral to Podiatry  Hyperglycemia -     Comprehensive metabolic panel with GFR -     Hemoglobin A1c    Return in about 4 weeks (around 06/27/2023) for hypertension, depression.       Trenton Frock, PA-C  [facility] at [facility] [phone] (phone) [phone] (fax)  [facility]

## 2023-05-30 NOTE — Assessment & Plan Note (Signed)
 Chronic, elevated today. Continue lisinopri l20 mg ,ordering cmp F/b 4-6 weeks, cont to monitor at home

## 2023-05-30 NOTE — Assessment & Plan Note (Signed)
 Residual word finding, short term memory changes, and RLE weakness and spasticity.  Graduated from PT, speech.  Follows with neuro, cardiology prn.  Manages with asa 81 mg, statin, zetia , ace-I. Muscle relaxants for spasticity.

## 2023-05-31 LAB — CBC WITH DIFFERENTIAL/PLATELET
Absolute Lymphocytes: 2187 {cells}/uL (ref 850–3900)
Absolute Monocytes: 490 {cells}/uL (ref 200–950)
Basophils Absolute: 21 {cells}/uL (ref 0–200)
Basophils Relative: 0.3 %
Eosinophils Absolute: 117 {cells}/uL (ref 15–500)
Eosinophils Relative: 1.7 %
HCT: 45.4 % — ABNORMAL HIGH (ref 35.0–45.0)
Hemoglobin: 15.2 g/dL (ref 11.7–15.5)
MCH: 29.7 pg (ref 27.0–33.0)
MCHC: 33.5 g/dL (ref 32.0–36.0)
MCV: 88.8 fL (ref 80.0–100.0)
MPV: 10.8 fL (ref 7.5–12.5)
Monocytes Relative: 7.1 %
Neutro Abs: 4085 {cells}/uL (ref 1500–7800)
Neutrophils Relative %: 59.2 %
Platelets: 301 10*3/uL (ref 140–400)
RBC: 5.11 10*6/uL — ABNORMAL HIGH (ref 3.80–5.10)
RDW: 11.8 % (ref 11.0–15.0)
Total Lymphocyte: 31.7 %
WBC: 6.9 10*3/uL (ref 3.8–10.8)

## 2023-05-31 LAB — COMPREHENSIVE METABOLIC PANEL WITH GFR
AG Ratio: 1.6 (calc) (ref 1.0–2.5)
ALT: 19 U/L (ref 6–29)
AST: 16 U/L (ref 10–35)
Albumin: 4.7 g/dL (ref 3.6–5.1)
Alkaline phosphatase (APISO): 68 U/L (ref 37–153)
BUN: 13 mg/dL (ref 7–25)
CO2: 29 mmol/L (ref 20–32)
Calcium: 10.1 mg/dL (ref 8.6–10.4)
Chloride: 105 mmol/L (ref 98–110)
Creat: 0.76 mg/dL (ref 0.50–1.03)
Globulin: 2.9 g/dL (ref 1.9–3.7)
Glucose, Bld: 95 mg/dL (ref 65–99)
Potassium: 4.2 mmol/L (ref 3.5–5.3)
Sodium: 141 mmol/L (ref 135–146)
Total Bilirubin: 0.7 mg/dL (ref 0.2–1.2)
Total Protein: 7.6 g/dL (ref 6.1–8.1)
eGFR: 92 mL/min/{1.73_m2} (ref >=60)

## 2023-05-31 LAB — HEMOGLOBIN A1C
Hgb A1c MFr Bld: 5.7 % — ABNORMAL HIGH (ref <5.7)
Mean Plasma Glucose: 117 mg/dL
eAG (mmol/L): 6.5 mmol/L

## 2023-06-02 ENCOUNTER — Encounter: Payer: Self-pay | Admitting: Physician Assistant

## 2023-06-16 ENCOUNTER — Ambulatory Visit (INDEPENDENT_AMBULATORY_CARE_PROVIDER_SITE_OTHER): Payer: 59

## 2023-06-16 DIAGNOSIS — I639 Cerebral infarction, unspecified: Secondary | ICD-10-CM

## 2023-06-16 LAB — CUP PACEART REMOTE DEVICE CHECK
Date Time Interrogation Session: 20250518232805
Implantable Pulse Generator Implant Date: 20240115

## 2023-06-20 ENCOUNTER — Ambulatory Visit (INDEPENDENT_AMBULATORY_CARE_PROVIDER_SITE_OTHER): Admitting: Podiatry

## 2023-06-20 DIAGNOSIS — M79674 Pain in right toe(s): Secondary | ICD-10-CM | POA: Diagnosis not present

## 2023-06-20 DIAGNOSIS — B351 Tinea unguium: Secondary | ICD-10-CM | POA: Diagnosis not present

## 2023-06-20 NOTE — Progress Notes (Unsigned)
       Subjective:  Patient ID: Erica Mcdaniel, female    DOB: Sep 02, 1970,  MRN: [id number]  Erica Mcdaniel presents to clinic today for:  Chief Complaint  Patient presents with   Nail Problem    Right hallux nail is thick and yellow. It has been like this for years. Not diabetic and takes ASA 81.    Patient presents with her son with concern of a very thick, discolored and painful right great toenail.  Patient had a stroke and has some neuropathy and weakness on the right side.  They are looking to treat the toenail.  Patient is open to having the toenail removed today.  She takes a baby aspirin  daily.  Allergies  Allergen Reactions   Clarithromycin Hives and Rash    Objective:  Erica Mcdaniel is a pleasant 53 y.o. female in NAD. AAO x 3.  Vascular Examination: Capillary refill time is 3-5 seconds to toes bilateral. Palpable pedal pulses b/l LE. Digital hair present b/l. No pedal edema b/l. Skin temperature gradient WNL b/l. No varicosities b/l. No cyanosis or clubbing noted b/l.   Dermatological Examination: There is severe thickening and dystrophy as well as fungal-appearing involvement to the right great toenail.  There is distal onycholysis.  There is pain with compression of the toenail.  No surrounding erythema is noted.     Latest Ref Rng & Units 05/30/2023    2:39 PM  Hemoglobin A1C  Hemoglobin-A1c <5.7 % 5.7    Assessment/Plan: 1. Fungal nail infection   2. Pain in right toe(s)    Discussed patient's condition today.  After obtaining patient consent, the right hallux was anesthetized with a 50:50 mixture of 1% lidocaine  plain and 0.5% bupivacaine  plain for a total of 3cc's administered.  Upon confirmation of anesthesia, a freer elevator was utilized to free the right hallux toenail from the nail bed.  The toenail was then avulsed proximal to the eponychium and removed in toto.  The area was inspected for any remaining spicules.   Antibiotic ointment and a DSD  were applied, followed by a Coban dressing.  Patient tolerated the anesthetic and procedure well and will f/u in 2-3 weeks for recheck.  Patient given post-procedure instructions for daily 15-minute Epsom salt soaks, antibiotic ointment and daily use of Bandaids until toe starts to dry / form eschar.   The toenail was then packaged and will be sent to [facility] for fungal nail culture.  Once receiving the fungal nail culture results, will review with the patient and discuss best treatment options for her to treat the new nail as it starts to grow in.  If this second nail comes back in fungal as well and does not respond to treatment, she might be open to permanent removal of that toenail in the future.   Return in about 2 weeks (around 07/04/2023) for nail avulsion recheck R-1.   Joe Murders, DPM, FACFAS [facility]     [address] [place], [place] [address]                Office [phone]  Fax [phone]

## 2023-06-20 NOTE — Patient Instructions (Addendum)
 STARTING TOMORROW: Place 1/4 cup of epsom salts in a quart of warm tap water .  Submerge your foot, or feet, in the solution and soak for 20 minutes.  This soak can be performed once or twice a day.  Next, remove your foot or feet from solution, blot dry the affected area. For the full 2 weeks.   IF YOUR SKIN BECOMES IRRITATED WHILE USING THESE INSTRUCTIONS, IT IS OKAY TO SWITCH TO  WHITE VINEGAR AND WATER  instead of Epsom salt.  As another alternative soak, you may use antibacterial soap and water .  Dry the area well after soaking.  Apply a small amount of antibiotic ointment (ie:  Neosporin, Triple Antibiotic ointment, or Bacitracin) to the area, followed by a Bandaid or light gauze dressing.  As the area starts to dry out over the next few days, you can remove the covering at bedtime to encourage the toe to dry out more.  Once it forms a dry scab, you can discontinue these instructions.  Monitor for any signs/symptoms of infection. Call the office immediately if any occur or go directly to the emergency room. Call with any questions/concerns.

## 2023-06-23 ENCOUNTER — Ambulatory Visit: Payer: Self-pay | Admitting: Cardiovascular Disease

## 2023-07-01 NOTE — Progress Notes (Signed)
 Carelink Summary Report / Loop Recorder

## 2023-07-04 ENCOUNTER — Other Ambulatory Visit: Payer: Self-pay | Admitting: Podiatry

## 2023-07-04 ENCOUNTER — Ambulatory Visit (INDEPENDENT_AMBULATORY_CARE_PROVIDER_SITE_OTHER): Admitting: Podiatry

## 2023-07-04 DIAGNOSIS — B351 Tinea unguium: Secondary | ICD-10-CM

## 2023-07-04 DIAGNOSIS — M79674 Pain in right toe(s): Secondary | ICD-10-CM

## 2023-07-04 MED ORDER — TERBINAFINE HCL 250 MG PO TABS: 250.0000 mg | ORAL_TABLET | Freq: Every day | ORAL | 0 refills | Status: DC

## 2023-07-04 NOTE — Progress Notes (Signed)
       Subjective:  Patient ID: Erica Mcdaniel, female    DOB: 11-Nov-1970,  MRN: [id number]  Chief Complaint  Patient presents with   PNA Check and Fungul results    Total removal of the right 1st nail. It looks great, still a little tender. Fungal results are in her chart. Not diabetic and takes ASA    Erica Mcdaniel presents to clinic today for f/u of avulsion to the right great toenail.  Her toenail was then sent to [facility] labs for fungal nail culture.  The results are back for review today.  PCP is Trenton Frock, PA-C.  Allergies  Allergen Reactions   Clarithromycin Hives and Rash    Objective:  Vascular Examination: Capillary refill time is 3-5 seconds to toes bilateral. Palpable pedal pulses b/l LE. Digital hair present b/l. No pedal edema b/l. Skin temperature gradient WNL b/l. No varicosities b/l. No cyanosis or clubbing noted b/l.   Dermatological Examination: Upon inspection of the nail avulsion site, there are no clinical signs of infection.  No purulence, no necrosis, no malodor present.  Minimal to no erythema present.  Eschar formed along nail margin.  Minimal to no pain on palpation of area.   Fungal nail culture results:   Assessment/Plan: 1. Fungal nail infection   2. Pain in right toe(s)     Meds ordered this encounter  Medications   terbinafine  (LAMISIL ) 250 MG tablet    Sig: Take 1 tablet (250 mg total) by mouth daily.    Dispense:  90 tablet    Refill:  0   Reviewed findings with the patient today.  Her CMP shows normal liver enzymes from her 05-30-23 visit, so went ahead and sent in a prescription for oral terbinafine  250 mg 1 tablet p.o. daily for 90 days.  She will continue to take this as the new nail starts to grow back and to see if we can have it grow in healthy.  Follow-up in 3 months for recheck  Return in about 3 months (around 10/04/2023) for fungal nail recheck R-1.   Joe Murders, DPM, FACFAS [facility]      [address] [place], [place] [address]                Office [phone]  Fax [phone]

## 2023-07-09 ENCOUNTER — Ambulatory Visit (INDEPENDENT_AMBULATORY_CARE_PROVIDER_SITE_OTHER): Payer: Self-pay | Admitting: Psychology

## 2023-07-09 DIAGNOSIS — F32A Depression, unspecified: Secondary | ICD-10-CM | POA: Diagnosis not present

## 2023-07-09 DIAGNOSIS — F419 Anxiety disorder, unspecified: Secondary | ICD-10-CM | POA: Diagnosis not present

## 2023-07-09 NOTE — Progress Notes (Addendum)
 Comprehensive Clinical Assessment (CCA) Note   07/09/2023 BIVIANA SADDLER [id number]  Time Spent: 4:15 pm - 4:59   : 44 Minutes  Chief Complaint: I've been depressed for a long time, but right now it's hit me because I can't drive.  Visit Diagnosis: Anxiety and depression [F41.9, F32.A]    Guardian/Payee:  Self    Paperwork requested: No   Reason for Visit /Presenting Problem: Patient has had numerous health issues and now can no longer drive which she describes as depressing. Patient has had two back surgeries, two shoulder surgeries, knee surgery, two hand surgeries.  Mental Status Exam: Appearance:   Casual     Behavior:  Appropriate  Motor:  Normal  Speech/Language:   Normal Rate  Affect:  Appropriate  Mood:  normal  Thought process:  normal  Thought content:    WNL  Sensory/Perceptual disturbances:    WNL  Orientation:  oriented to person, place, and time/date  Attention:  Good  Concentration:  Good  Memory:  WNL  Fund of knowledge:   Good  Insight:    Good  Judgment:   Good  Impulse Control:  Good   Reported Symptoms:  Anxiety and Depression  Risk Assessment: Danger to Self:  No Self-injurious Behavior: No Danger to Others: No Duty to Warn:no Physical Aggression / Violence:No  Access to Firearms a concern: No  Gang Involvement:No  Patient / guardian was educated about steps to take if suicide or homicide risk level increases between visits: yes While future psychiatric events cannot be accurately predicted, the patient does not currently require acute inpatient psychiatric care and does not currently meet [place]  involuntary commitment criteria.  Substance Abuse History: Current substance abuse: No     Caffeine: iced tea  Tobacco: no quit a long time ago Alcohol: no Substance use: no  Past Psychiatric History:   Previous psychological history is significant for depression Outpatient Providers: doesn't remember who she spoke with because it  was 20 years ago History of Psych Hospitalization: No  Psychological Testing: No   Abuse History:  Victim of: No.   Report needed: No. Victim of Neglect:No. Perpetrator of No  Witness / Exposure to Domestic Violence: No   Protective Services Involvement: No  Witness to [employer] Violence:  No   Family History:  Family History  Problem Relation Age of Onset   Lung cancer Mother        meso   Arthritis Mother    Thyroid  disease Father    Arthritis Father    Arthritis Sister    Heart disease Brother    Arthritis Brother    Colon polyps Brother    Hyperlipidemia Brother    Diabetes Maternal Grandmother    Diabetes Maternal Grandfather    Heart attack Maternal Grandfather    Colon cancer Neg Hx    Uterine cancer Neg Hx    Stomach cancer Neg Hx    Rectal cancer Neg Hx     Living situation: the patient lives with their son  Sexual Orientation: Straight  Relationship Status: divorced x 25 years Name of spouse / Chyrl  If a parent, number of children / ages:Logan 52 years old (Bernardino is his father)  Support Systems: Family  Financial Stress:  Yes   Income/Employment/Disability: Neurosurgeon: No   Educational History: Education: college, Publishing copy, 2 year Accounting degree  Religion/Sprituality/World View: [employer], no longer goes to [employer]. Patient wishes she could read the bible but hasn't  been able to since the stroke.   Any cultural differences that may affect / interfere with treatment:  not applicable   Recreation/Hobbies: None right now.  Stressors: Health issues, Housing issues, Financial difficulties    Strengths: Family sister Rosaline, brother Rankin, son Leontine  Barriers:  Patient has cognitive impairment following a stroke, and patient doesn't have transportation    Legal History: Pending legal issue / charges: The patient has no significant history of legal issues. History of legal issue / charges:  No  Medical History/Surgical History: not reviewed Past Medical History:  Diagnosis Date   Acute ischemic left MCA stroke (HCC) 09/25/2021   Anxiety    Arthritis    Cancer (HCC)    skin squamous cell arm   Depression    Fibromyalgia    GERD (gastroesophageal reflux disease)    Hypertension    IBS (irritable bowel syndrome)    Pneumonia    hx   Polycystic ovarian disease    Tendonitis    Tubular adenoma of colon 2018   multiple    Past Surgical History:  Procedure Laterality Date   APPENDECTOMY     BACK SURGERY     BACK SURGERY  2010   rods put in   IR ANGIO INTRA EXTRACRAN SEL COM CAROTID INNOMINATE UNI R MOD SED  09/25/2021   IR CT HEAD LTD  09/25/2021   IR CT HEAD LTD  09/25/2021   IR INTRAVSC STENT CERV CAROTID W/O EMB-PROT MOD SED INC ANGIO  09/25/2021   IR PERCUTANEOUS ART THROMBECTOMY/INFUSION INTRACRANIAL INC DIAG ANGIO  09/25/2021   IR US  GUIDE VASC ACCESS LEFT  09/25/2021   IR US  GUIDE VASC ACCESS RIGHT  09/25/2021   KNEE ARTHROSCOPY Left 13   NEUROPLASTY / TRANSPOSITION MEDIAN NERVE AT CARPAL TUNNEL BILATERAL     OVARIAN CYST SURGERY  1983   RADIOLOGY WITH ANESTHESIA N/A 09/25/2021   Procedure: IR WITH ANESTHESIA;  Surgeon: Dolphus Carrion, MD;  Location: [facility] OR;  Service: Radiology;  Laterality: N/A;   ROTATOR CUFF REPAIR  2003,04   right, and elbow,left   TENDON RECONSTRUCTION Right 12/08/2012   Procedure: ELBOW TENDON RECONSTRUCTION/Right Lateral Epicondylar Debridement with Repair.;  Surgeon: Maude LELON Right, MD;  Location: [facility] OR;  Service: Orthopedics;  Laterality: Right;  Right Lateral Epicondylar Debridement with Repair.   TRIGGER FINGER RELEASE Right 08    Medications: Current Outpatient Medications  Medication Sig Dispense Refill   aspirin  81 MG chewable tablet Place 1 tablet (81 mg total) into feeding tube daily. 30 tablet 0   Baclofen  5 MG TABS Take 1 tablet (5 mg total) by mouth 3 (three) times daily as needed. 90 tablet 1   busPIRone  (BUSPAR ) 15  MG tablet Take 1 tablet (15 mg total) by mouth 2 (two) times daily. 180 tablet 1   escitalopram  (LEXAPRO ) 10 MG tablet Take 1 tablet (10 mg total) by mouth daily. 90 tablet 1   esomeprazole  (NEXIUM ) 40 MG capsule Take 1 capsule (40 mg total) by mouth daily. 90 capsule 1   ezetimibe  (ZETIA ) 10 MG tablet Take 1 tablet (10 mg total) by mouth daily. 90 tablet 3   lisinopril  (ZESTRIL ) 20 MG tablet Take 1 tablet (20 mg total) by mouth daily. 90 tablet 1   nystatin  (MYCOSTATIN /NYSTOP ) powder APPLY  POWDER TOPICALLY TWICE DAILY 60 g 3   rosuvastatin  (CRESTOR ) 20 MG tablet Take 1 tablet (20 mg total) by mouth daily. 90 tablet 1   terbinafine  (LAMISIL ) 250 MG tablet Take  1 tablet (250 mg total) by mouth daily. 90 tablet 0   No current facility-administered medications for this visit.    Allergies  Allergen Reactions   Clarithromycin Hives and Rash    Diagnoses:  Anxiety and depression [F41.9, F32.A]    Psychiatric Treatment: Yes , via PCP  Plan of Care: OPT  Narrative:   [name] participated from home, via phone, is aware of tele-sessions limitations, and consented to treatment. Therapist participated from home office. We reviewed the limits of confidentiality prior to the start of the evaluation. Ashiyah D Geise expressed understanding and agreement to proceed.   Patient did not show up at the scheduled appt time, so this writer called the patient. Patient requested that we meet by phone so we did. Patient is a 52 year old female who presented for an initial assessment. Patient reported symptoms of depression. Patient denied current and past suicidal ideation, homicidal ideation, and symptoms of psychosis. Patient denied current or past alcohol, tobacco, or drug use. Patient reported current stressors as her health and not being able to drive anymore. She gave her car to her son, Leontine. Patient identified current supports as her son and his girlfriend. Patient had a stroke, and was  in the hospital, then in a nursing home ([facility] in [place]) for 4 months. Patient then got better, and was sent home. Patient couldn't afford live in help at home, and had to move in with [facility]. Leontine is 51 year old son, Jhon is Logan's significant other, they have been together for 2.5 years, patient considers her a daughter in Social worker. When patient did not show at her virtual appt time, this writer called the patient contact phone number in the patient's electronic chart. Patient's son, Leontine, answered the phone and let this writer know that he was not happy that this Clinical research associate had called him. Patient's son did not appreciate being interrupted, and he expressed his displeasure to this Clinical research associate. Patient's son gave this Clinical research associate his mother's phone number before he slammed the phone shut. It is recommended that patient participate in individual outpatient psychotherapy.   A follow-up was scheduled to create a treatment plan and begin treatment. Therapist answered all questions during the evaluation and contact information was provided.     [name]

## 2023-07-15 ENCOUNTER — Ambulatory Visit: Admitting: Psychology

## 2023-07-15 DIAGNOSIS — F419 Anxiety disorder, unspecified: Secondary | ICD-10-CM | POA: Diagnosis not present

## 2023-07-15 DIAGNOSIS — F32A Depression, unspecified: Secondary | ICD-10-CM | POA: Diagnosis not present

## 2023-07-15 NOTE — Progress Notes (Unsigned)
 [facility] Counselor/Therapist Progress Note  Patient ID: Erica Mcdaniel, MRN: [id number]    Date: 07/15/23  Time Spent: 4:00 pm - 4:53   : 53 Minutes  Treatment Type: Individual Therapy.  Reported Symptoms: Anxiety and Depression  Mental Status Exam: Appearance:  Well Groomed     Behavior: Appropriate  Motor: Normal  Speech/Language:  Normal Rate  Affect: Appropriate  Mood: normal  Thought process: normal  Thought content:   WNL  Sensory/Perceptual disturbances:   WNL  Orientation: oriented to person, place, and time/date  Attention: Good  Concentration: Good  Memory: WNL  Fund of knowledge:  Good  Insight:   Good  Judgment:  Good  Impulse Control: Good   Risk Assessment: Danger to Self:  No Self-injurious Behavior: No Danger to Others: No Duty to Warn:no Physical Aggression / Violence:No  Access to Firearms a concern: No  Gang Involvement:No   Subjective:   Erica Mcdaniel participated from home, via phone, and consented to treatment. I discussed the limitations of evaluation and management by telemedicine and the availability of in person appointments. The patient expressed understanding and agreed to proceed.  Therapist participated from home office.  Erica Mcdaniel reviewed the events of the past week.   Patient had technical difficulties with finding the link to join this virtual session. Patient asked if we could meet by phone so this writer called the patient and we had our session by phone. Patient had gone to [facility] for Speech, Occupational, and Physical therapy. Patient reported   Interventions: Cognitive Behavioral Therapy  Diagnosis:  No diagnosis found.  Psychiatric Treatment: Yes , via PCP  Treatment Plan:  Client Abilities/Strengths Erica Mcdaniel ***  Support System: Family  Client Treatment Preferences OPT  Client Statement of Needs Erica Mcdaniel would like to ***   Treatment Level Biweekly  Symptoms  Depression:  status maintained    Goals:   Erica Mcdaniel experiences symptoms of depression.   Target Date: 07/08/24 Frequency: Biweekly  Progress: 0 Modality: individual    Therapist will provide referrals for additional resources as appropriate.  Therapist will provide psycho-education regarding Erica Mcdaniel's diagnosis and corresponding treatment approaches and interventions. Erica Mcdaniel will support the patient's ability to achieve the goals identified. will employ CBT, BA, Problem-solving, Solution Focused, Mindfulness,  coping skills, & other evidenced-based practices will be used to promote progress towards healthy functioning to help manage decrease symptoms associated with their diagnosis.   Reduce overall level, frequency, and intensity of the feelings of depression, anxiety and panic evidenced by decreased overall symptoms from 6 to 7 days/week to 0 to 1 days/week per client report for at least 3 consecutive months. Verbally express understanding of the relationship between feelings of depression and it's  impact on thinking patterns and behaviors. Verbalize an understanding of the role that distorted thinking plays in creating fears, excessive worry, and ruminations.  Erica Mcdaniel participated in the creation of the treatment plan)    Erica Mcdaniel

## 2023-07-15 NOTE — Progress Notes (Signed)
 [facility] Counselor/Therapist Progress Note  Patient ID: Erica Mcdaniel, MRN: [id number]   Date: 07/15/23  Time Spent: 4:05 pm - 4:50 pm :  45 minutes (session started 5 minutes late due to patient having issues with technology).  Treatment Type: Individual Therapy.  Reported Symptoms: Anxiety and Depression  Mental Status Exam: Appearance:  Casual     Behavior: Appropriate  Motor: Restlestness  Speech/Language:  Slow  Affect: Flat  Mood: normal  Thought process: tangential  Thought content:   Tangential  Sensory/Perceptual disturbances:   WNL  Orientation: oriented to person, place, and time/date  Attention: Good  Concentration: Good  Memory: from a stroke, impaired cognition  Fund of knowledge:  Fair  Insight:   Fair  Judgment:  Fair  Impulse Control: Good   Risk Assessment: Danger to Self:  No Self-injurious Behavior: No Danger to Others: No Duty to Warn:no Physical Aggression / Violence:No  Access to Firearms a concern: No  Gang Involvement:No   Subjective:   [name] participated from home, via phone and consented to treatment. Therapist participated from home office. I discussed the limitations of evaluation and management by telemedicine and the availability of in person appointments. The patient expressed understanding and agreed to proceed. Tavionna reviewed the events of the past week.   Patient had difficulty with the technology, so we did the session by phone. Patient's son came home during our session.   We reviewed numerous treatment approaches including CBT, BA, Problem Solving, and Solution focused therapy. Psych-education regarding the Camani's diagnosis of Anxiety and Depression was provided during the session. We discussed Dayannara D Vital's goals treatment goals which include:  Patient would like to be able to read more than she currently does.  2.   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  . Adela D Diggins provided verbal approval of the treatment plan.   Interventions: Psycho-education & Goal Setting.   Diagnosis:  Anxiety and depression [F41.9, F32.A]   Psychiatric Treatment: Yes , via PCP  Treatment Plan:  Client Abilities/Strengths Helga is motivated, engaging, and open about all that she is going through.  Support System: Family  Client Treatment Preferences OPT  Client Statement of Needs Brooklynn would like to be able to read more than she currently does, and to be able to write again.    Treatment Level Weekly/Biweekly  Symptoms Anxiety (Status: maintained) Depression (Status: maintained)  Goals:   Elen experiences symptoms of anxiety and depression.  Treatment plan signed and available on s-drive:  No    Target Date: 07/08/24 Frequency: Weekly/Biweekly  Progress: 0 Modality: individual    Therapist will provide referrals for additional resources as appropriate.  Therapist will provide psycho-education regarding Kimbrely's diagnosis and corresponding treatment approaches and interventions. [name] will support the patient's ability to achieve the goals identified. will employ CBT, BA, Problem-solving, Solution Focused, Mindfulness,  coping skills, & other evidenced-based practices will be used to promote progress towards healthy functioning to help manage decrease symptoms associated with their diagnosis.   Reduce overall level, frequency, and intensity of the feelings of depression, anxiety and panic evidenced by decreased overall symptoms from 6 to 7 days/week to 0 to 1 days/week per client report for at least 3 consecutive months. Verbally express understanding of the relationship between feelings of depression, and anxiety and their impact on thinking patterns and behaviors. Verbalize an understanding of the role that distorted thinking plays in creating  fears, excessive worry, and ruminations.    [name]  participated in the creation of the treatment plan)    [name]

## 2023-07-17 ENCOUNTER — Ambulatory Visit (INDEPENDENT_AMBULATORY_CARE_PROVIDER_SITE_OTHER)

## 2023-07-17 DIAGNOSIS — I639 Cerebral infarction, unspecified: Secondary | ICD-10-CM

## 2023-07-17 LAB — CUP PACEART REMOTE DEVICE CHECK
Date Time Interrogation Session: 20250618232431
Implantable Pulse Generator Implant Date: 20240115

## 2023-07-18 ENCOUNTER — Ambulatory Visit: Payer: Self-pay | Admitting: Podiatry

## 2023-07-18 ENCOUNTER — Ambulatory Visit: Payer: Self-pay | Admitting: Cardiovascular Disease

## 2023-07-25 ENCOUNTER — Encounter (HOSPITAL_COMMUNITY): Payer: Self-pay | Admitting: Interventional Radiology

## 2023-07-29 ENCOUNTER — Ambulatory Visit (INDEPENDENT_AMBULATORY_CARE_PROVIDER_SITE_OTHER): Admitting: Psychology

## 2023-07-29 DIAGNOSIS — F419 Anxiety disorder, unspecified: Secondary | ICD-10-CM

## 2023-07-29 DIAGNOSIS — F32A Depression, unspecified: Secondary | ICD-10-CM

## 2023-07-29 NOTE — Progress Notes (Addendum)
 [facility] Counselor/Therapist Progress Note  Patient ID: Erica Mcdaniel, MRN: [id number]    Date: 07/29/23  Time Spent: 4:05 pm - 4:59 pm :  54 minutes (Patient was very overwhelmed by technology so we had our appointment by phone which worked out very well.)  Treatment Type: Individual Therapy.  Reported Symptoms: Anxiety and Depression  Mental Status Exam: Appearance:  Casual     Behavior: Appropriate  Motor: Shuffling Gait  Speech/Language:  Has intermittent aphasia  Affect: Restricted  Mood: decreased range  Thought process: concrete  Thought content:   Tangential  Sensory/Perceptual disturbances:   WNL  Orientation: oriented to place, person, Tuesday  Attention: Good  Concentration: Good  Memory: Impaired cognitive ability due to stroke  Fund of knowledge:  Fair  Insight:   Good  Judgment:  Good  Impulse Control: Good   Risk Assessment: Danger to Self:  No Self-injurious Behavior: No Danger to Others: No Duty to Warn:no Physical Aggression / Violence:No  Access to Firearms a concern: No  Gang Involvement:No   Subjective:   Erica Mcdaniel participated from home, via phone, and consented to treatment. I discussed the limitations of evaluation and management by telemedicine and the availability of in person appointments. The patient expressed understanding and agreed to proceed.  Therapist participated from home office.  Sravya reviewed the events of the past week.   Phone [phone], We discussed how difficult it can be trying to be mobile.  Logan usually gets home about 4:30 pm if he's not going to the grocery store, Geradine gets home around 8:00 pm. Patient has to use a cane and has been on disability for 20+ years.Patient reported that [place] and has 10 stairs in the front of her trailer. Patient doesn't get out much because of the stairs but tomorrow she'll use the stairs to get to the bank. Logan works from 4 am  until 3:30 pm M-R, Fri 4 am  - noon. Patient shared her daily and nightly routines with this Clinical research associate. Patient described her right leg and describes from her knee down to her foot as crooked'. Patient explains that is why she can't drive, and that is the most stressful to her.   Interventions: Cognitive Behavioral Therapy  Diagnosis:  Anxiety and depression [F41.9, F32.A]   Psychiatric Treatment: Yes , via PCP  Treatment Plan:  Client Abilities/Strengths Mya is motivated, engaging, and open about all that she is going through.   Support System: Family  Client Treatment Preferences OPT  Client Statement of Needs Shashana would like to would like to be able to read more than she currently does, and to be able to write again.     Treatment Level Weekly  Symptoms  Anxiety (Status: maintained) Depression (Status: maintained)  Goals:   Winston experiences symptoms of anxiety and depression.    Target Date: 07/08/24 Frequency: Weekly  Progress: 0 Modality: individual    Therapist will provide referrals for additional resources as appropriate.  Therapist will provide psycho-education regarding Brooklynne's diagnosis and corresponding treatment approaches and interventions. [name] will support the patient's ability to achieve the goals identified. will employ CBT, BA, Problem-solving, Solution Focused, Mindfulness,  coping skills, & other evidenced-based practices will be used to promote progress towards healthy functioning to help manage decrease symptoms associated with their diagnosis.   Reduce overall level, frequency, and intensity of the feelings of depression, anxiety and panic evidenced by decreased overall symptoms from 6 to 7 days/week  to 0 to 1 days/week per client report for at least 3 consecutive months. Verbally express understanding of the relationship between feelings of depression and anxiety and their impact on thinking patterns and behaviors. Verbalize  an understanding of the role that distorted thinking plays in creating fears, excessive worry, and ruminations.  [name] participated in the creation of the treatment plan)   [name]

## 2023-08-05 ENCOUNTER — Ambulatory Visit

## 2023-08-05 ENCOUNTER — Telehealth: Payer: Self-pay

## 2023-08-05 NOTE — Telephone Encounter (Signed)
 Unsuccessful attempts to reach patient on preferred number listed in notes for scheduled AWV. Left message on voicemail okay to reschedule.

## 2023-08-05 NOTE — Progress Notes (Signed)
 Carelink Summary Report / Loop Recorder

## 2023-08-07 ENCOUNTER — Ambulatory Visit (INDEPENDENT_AMBULATORY_CARE_PROVIDER_SITE_OTHER): Admitting: Psychology

## 2023-08-07 DIAGNOSIS — F32A Depression, unspecified: Secondary | ICD-10-CM

## 2023-08-07 DIAGNOSIS — F419 Anxiety disorder, unspecified: Secondary | ICD-10-CM

## 2023-08-07 NOTE — Progress Notes (Signed)
 [facility] Counselor/Therapist Progress Note  Patient ID: Erica Mcdaniel, MRN: [id number]    Date: 08/07/23  Time Spent: 10:02 am - 10:55 am  :53 minutes  (met by phone)  Treatment Type: Individual Therapy.  Reported Symptoms: Anxiety (Status: maintained) Depression (Status: maintained)  Mental Status Exam: Appearance:  Casual     Behavior: Appropriate  Motor: Normal  Speech/Language:  Normal Rate  Affect: Appropriate  Mood: normal  Thought process: normal  Thought content:   WNL  Sensory/Perceptual disturbances:   WNL  Orientation: oriented to person, place, and time/date  Attention: Good  Concentration: Good  Memory: WNL  Fund of knowledge:  Good  Insight:   Good  Judgment:  Good  Impulse Control: Good   Risk Assessment: Danger to Self:  No Self-injurious Behavior: No Danger to Others: No Duty to Warn:no Physical Aggression / Violence:No  Access to Firearms a concern: No  Gang Involvement:No   Subjective:   Erica Mcdaniel participated from home, via phone, and consented to treatment. I discussed the limitations of evaluation and management by telemedicine and the availability of in person appointments. The patient expressed understanding and agreed to proceed.  Therapist participated from home office.  Erica Mcdaniel reviewed the events of the past week.   We discussed aphasia and what that was like for the patient. Patient reported that she has good long term memory, but that her short term memory is out of whack. Patient also reported some memories that made her sad and other memories that make her happy.   Interventions: Cognitive Behavioral Therapy  Diagnosis:   Anxiety and depression [F41.9, F32.A]   Psychiatric Treatment: No   Treatment Plan:  Client Abilities/Strengths Erica Mcdaniel  is motivated, engaging, and open about all that she is going through.   Support System: Family  Client Treatment Preferences OPT  Client Statement  of Needs Erica Mcdaniel would like to  be able to read more than she currently does, and to be able to write again.      Treatment Level Weekly  Symptoms  Anxiety (Status: maintained) Depression (Status: maintained)  Goals:   Erica Mcdaniel experiences symptoms of anxiety and depression.  Target Date: 07/08/24 Frequency: Weekly  Progress: 0 Modality: individual    Therapist will provide referrals for additional resources as appropriate.  Therapist will provide psycho-education regarding Erica Mcdaniel's diagnosis and corresponding treatment approaches and interventions. Erica Mcdaniel will support the patient's ability to achieve the goals identified. will employ CBT, BA, Problem-solving, Solution Focused, Mindfulness,  coping skills, & other evidenced-based practices will be used to promote progress towards healthy functioning to help manage decrease symptoms associated with their diagnosis.   Reduce overall level, frequency, and intensity of the feelings of depression, anxiety and panic evidenced by decreased overall symptom from 6 to 7 days/week to 0 to 1 days/week per client report for at least 3 consecutive months. Verbally express understanding of the relationship between feelings of depression anxiety and their impact on thinking patterns and behaviors. Verbalize an understanding of the role that distorted thinking plays in creating fears, excessive worry, and ruminations.  Erica Mcdaniel participated in the creation of the treatment plan)   Erica Mcdaniel

## 2023-08-14 ENCOUNTER — Ambulatory Visit (INDEPENDENT_AMBULATORY_CARE_PROVIDER_SITE_OTHER): Admitting: Psychology

## 2023-08-14 DIAGNOSIS — F419 Anxiety disorder, unspecified: Secondary | ICD-10-CM

## 2023-08-14 DIAGNOSIS — F32A Depression, unspecified: Secondary | ICD-10-CM | POA: Diagnosis not present

## 2023-08-14 NOTE — Progress Notes (Signed)
 [facility] Counselor/Therapist Progress Note  Patient ID: Erica Mcdaniel, MRN: [id number]    Date: 08/14/23  Time Spent: 10:03 am - 10:45 am   :42 minutes  Treatment Type: Individual Therapy.  Reported Symptoms: Anxiety and depression [F41.9, F32.A]   Mental Status Exam: Appearance:  Casual     Behavior: Appropriate                        Motor Normal  Speech/Language:  Slow  Affect: Flat  Mood: normal  Thought process: concrete  Thought content:   WNL  Sensory/Perceptual disturbances:   WNL  Orientation: oriented to person, place, and time/date  Attention: Good  Concentration: Fair  Memory: Impaired short term memory  Fund of knowledge:  Fair  Insight:   Fair  Judgment:  Good  Impulse Control: Good   Risk Assessment: Danger to Self:  No Self-injurious Behavior: No Danger to Others: No Duty to Warn:no Physical Aggression / Violence:No  Access to Firearms a concern: No  Gang Involvement:No   Subjective:   Erica Mcdaniel participated from home, via phone, and consented to treatment. I discussed the limitations of evaluation and management by telemedicine and the availability of in person appointments. The patient expressed understanding and agreed to proceed.  Therapist participated from home office.  Coty reviewed the events of the past week.   Patient always forgets that this writer calls her at 10 am. We discussed some cognitive behavioral techniques to help patient with scheduled phone calls and other ADLs.   Interventions: Cognitive Behavioral Therapy  Diagnosis: Anxiety and depression [F41.9, F32.A]   Psychiatric Treatment: No   Treatment Plan:  Client Abilities/Strengths Erica Mcdaniel is motivated, engaging, and open about all that she is going through.   Support System: Family  Client Treatment Preferences OPT  Client Statement of Needs Erica Mcdaniel would like to be able to read more than she currently does, and to be able to  write again.    Treatment Level Weekly  Symptoms  Anxiety (Status: maintained) Depression (Status: maintained)  Goals:   Erica Mcdaniel experiences symptoms of anxiety and depression.    Target Date: 07/08/24 Frequency: Weekly  Progress: 0 Modality: individual    Therapist will provide referrals for additional resources as appropriate.  Therapist will provide psycho-education regarding Amrutha's diagnosis and corresponding treatment approaches and interventions. [name] will support the patient's ability to achieve the goals identified. will employ CBT, BA, Problem-solving, Solution Focused, Mindfulness,  coping skills, & other evidenced-based practices will be used to promote progress towards healthy functioning to help manage decrease symptoms associated with their diagnosis.   Reduce overall level, frequency, and intensity of the feelings of depression, anxiety and panic evidenced by decreased overall symptoms from 6 to 7 days/week to 0 to 1 days/week per client report for at least 3 consecutive months. Verbally express understanding of the relationship between feelings of depression and anxiety and their impact on thinking patterns and behaviors. Verbalize an understanding of the role that distorted thinking plays in creating fears, excessive worry, and ruminations.  [name] participated in the creation of the treatment plan)    [name]

## 2023-08-18 ENCOUNTER — Ambulatory Visit

## 2023-08-18 DIAGNOSIS — I639 Cerebral infarction, unspecified: Secondary | ICD-10-CM

## 2023-08-19 LAB — CUP PACEART REMOTE DEVICE CHECK
Date Time Interrogation Session: 20250720233415
Implantable Pulse Generator Implant Date: 20240115

## 2023-08-21 ENCOUNTER — Ambulatory Visit: Admitting: Psychology

## 2023-08-21 DIAGNOSIS — F32A Depression, unspecified: Secondary | ICD-10-CM | POA: Diagnosis not present

## 2023-08-21 DIAGNOSIS — F419 Anxiety disorder, unspecified: Secondary | ICD-10-CM | POA: Diagnosis not present

## 2023-08-21 NOTE — Progress Notes (Unsigned)
 [facility] Counselor/Therapist Progress Note  Patient ID: MORRISON MASSER, MRN: [id number]    Date: 08/21/23  Time Spent: 12:01 pm - 12:54 pm : 54 minutes    Treatment Type: Individual Therapy.  Reported Symptoms: ***  Mental Status Exam: Appearance:  {PSY:22683}     Behavior: {PSY:21022743}  Motor: {PSY:22302}  Speech/Language:  {PSY:22685}  Affect: {PSY:22687}  Mood: {PSY:31886}  Thought process: {PSY:31888}  Thought content:   {[phone]}  Sensory/Perceptual disturbances:   {PSY:[id number]}  Orientation: {PSY:30297}  Attention: {PSY:22877}  Concentration: {PSY:832-461-7044}  Memory: {[phone]}  Fund of knowledge:  {PSY:832-461-7044}  Insight:   {PSY:832-461-7044}  Judgment:  {PSY:832-461-7044}  Impulse Control: {PSY:[phone]}   Risk Assessment: Danger to Self:  {PSY:22692} Self-injurious Behavior: {PSY:22692} Danger to Others: {PSY:22692} Duty to Warn:{PSY:311194} Physical Aggression / Violence:{PSY:21197} Access to Firearms a concern: {PSY:21197} Gang Involvement:{PSY:21197}  Subjective:   [name] participated from {Patient Location:[address]::home}, via {ONMVIDEOORPHONE:26699}, and consented to treatment. I discussed the limitations of evaluation and management by telemedicine and the availability of in person appointments. The patient expressed understanding and agreed to proceed.  Therapist participated from {onmtherapistlocation:[address]}.  Mischele reviewed the events of the past week.   ***   Interventions: {PSY:[phone]}  Diagnosis:  No diagnosis found.  Psychiatric Treatment: {YES/NO:21197}, ***  Treatment Plan:  Client Abilities/Strengths Rukia ***  Support System: ***  Client Treatment Preferences ***  Client Statement of Needs Lauran would like to ***   Treatment Level {Frequency of sessions.:26745}  Symptoms  ***   (Status: {Symptom Status:26744}) ***   (Status: {Symptom  Status:26744})  Goals:   Ayzia experiences symptoms of ***   Target Date: *** Frequency: {Frequency of sessions.:26745}  Progress: 0 Modality: individual    Therapist will provide referrals for additional resources as appropriate.  Therapist will provide psycho-education regarding Adriona's diagnosis and corresponding treatment approaches and interventions. [name] will support the patient's ability to achieve the goals identified. will employ CBT, BA, Problem-solving, Solution Focused, Mindfulness,  coping skills, & other evidenced-based practices will be used to promote progress towards healthy functioning to help manage decrease symptoms associated with {his/her/their:21314} diagnosis.   Reduce overall level, frequency, and intensity of the feelings of depression, anxiety and panic evidenced by decreased *** from 6 to 7 days/week to 0 to 1 days/week per client report for at least 3 consecutive months. Verbally express understanding of the relationship between feelings of ***depression, ***anxiety and their impact on thinking patterns and behaviors. Verbalize an understanding of the role that distorted thinking plays in creating fears, excessive worry, and ruminations.  [name] participated in the creation of the treatment plan)   [name] [name]

## 2023-08-24 ENCOUNTER — Ambulatory Visit: Payer: Self-pay | Admitting: Cardiovascular Disease

## 2023-08-27 ENCOUNTER — Other Ambulatory Visit: Payer: Self-pay | Admitting: Physician Assistant

## 2023-08-27 DIAGNOSIS — F419 Anxiety disorder, unspecified: Secondary | ICD-10-CM

## 2023-08-28 ENCOUNTER — Ambulatory Visit (INDEPENDENT_AMBULATORY_CARE_PROVIDER_SITE_OTHER): Admitting: Psychology

## 2023-08-28 DIAGNOSIS — F32A Depression, unspecified: Secondary | ICD-10-CM

## 2023-08-28 DIAGNOSIS — F419 Anxiety disorder, unspecified: Secondary | ICD-10-CM | POA: Diagnosis not present

## 2023-08-28 NOTE — Progress Notes (Signed)
 [facility] Counselor/Therapist Progress Note  Patient ID: Erica Mcdaniel, MRN: [id number]    Date: 08/28/23  Time Spent: 10:00 am -  10:53 am - 53 minutes  Treatment Type: Individual Therapy.  Reported Symptoms:  Anxiety and depression [F41.9, F32.A]   Mental Status Exam: Appearance:  Casual     Behavior: Appropriate  Motor: Normal  Speech/Language:  Slow  Affect: Flat  Mood: normal  Thought process: concrete  Thought content:   WNL  Sensory/Perceptual disturbances:   WNL  Orientation: oriented to person, place, and time/date  Attention: Good  Concentration: Good  Memory: WNL  Fund of knowledge:  Good  Insight:   Good  Judgment:  Good  Impulse Control: Good   Risk Assessment: Danger to Self:  No Self-injurious Behavior: No Danger to Others: No Duty to Warn:no Physical Aggression / Violence:No  Access to Firearms a concern: No  Gang Involvement:No   Subjective:   Erica Mcdaniel participated from home, via phone, and consented to treatment. I discussed the limitations of evaluation and management by telemedicine and the availability of in person appointments. The patient expressed understanding and agreed to proceed.  Therapist participated from home office.  Erica Mcdaniel reviewed the events of the past week.   Patient and provider were having difficulties due to [employer] spotty reception so patient and provider used their cell phones. Patient talked at length about growing up in a difficult situation, and making the most of it. Patient learned some techniques to decrease her anxiety when she was younger, and she was receptive to learning some stress management skills during our session today.   Interventions: Cognitive Behavioral Therapy  Diagnosis:  Anxiety and depression [F41.9, F32.A]    Psychiatric Treatment: No   Treatment Plan:  Client Abilities/Strengths Erica Mcdaniel  is motivated, engaging, and open about all that she is going through.    Support System: Family  Client Treatment Preferences OPT  Client Statement of Needs Erica Mcdaniel would like to be able to read more than she currently does, and to be able to write again.      Treatment Level Weekly  Symptoms  Anxiety (Status: maintained) Depression (Status: maintained)  Goals:   Erica Mcdaniel experiences symptoms of anxiety and depression.   Target Date: 07/08/24 Frequency: Weekly  Progress: 0 Modality: individual    Therapist will provide referrals for additional resources as appropriate.  Therapist will provide psycho-education regarding Erica Mcdaniel diagnosis and corresponding treatment approaches and interventions. Erica Mcdaniel will support the patient's ability to achieve the goals identified. will employ CBT, BA, Problem-solving, Solution Focused, Mindfulness,  coping skills, & other evidenced-based practices will be used to promote progress towards healthy functioning to help manage decrease symptoms associated with their diagnosis.   Reduce overall level, frequency, and intensity of the feelings of depression, anxiety and panic evidenced by decreased overall symptoms from 6 to 7 days/week to 0 to 1 days/week per client report for at least 3 consecutive months. Verbally express understanding of the relationship between feelings of depression and anxiety and their impact on thinking patterns and behaviors. Verbalize an understanding of the role that distorted thinking plays in creating fears, excessive worry, and ruminations.  Erica Mcdaniel participated in the creation of the treatment plan)    Erica Mcdaniel

## 2023-09-04 ENCOUNTER — Encounter: Admitting: Psychology

## 2023-09-04 NOTE — Progress Notes (Unsigned)
 [facility] Counselor/Therapist Progress Note  Patient ID: Erica Mcdaniel, MRN: [id number]    Date: 09/04/23  Time Spent: 10:00 am - 10:53 am :  minutes  Treatment Type: Individual Therapy.  Reported Symptoms: ***  Mental Status Exam: Appearance:  {PSY:22683}     Behavior: {PSY:21022743}  Motor: {PSY:22302}  Speech/Language:  {PSY:22685}  Affect: {PSY:22687}  Mood: {PSY:31886}  Thought process: {PSY:31888}  Thought content:   {PSY:330-857-2052}  Sensory/Perceptual disturbances:   {PSY:2062734185}  Orientation: {PSY:30297}  Attention: {PSY:22877}  Concentration: {PSY:915-228-6581}  Memory: {PSY:315-179-7846}  Fund of knowledge:  {PSY:915-228-6581}  Insight:   {PSY:915-228-6581}  Judgment:  {PSY:915-228-6581}  Impulse Control: {PSY:915-228-6581}   Risk Assessment: Danger to Self:  {PSY:22692} Self-injurious Behavior: {PSY:22692} Danger to Others: {PSY:22692} Duty to Warn:{PSY:311194} Physical Aggression / Violence:{PSY:21197} Access to Firearms a concern: {PSY:21197} Gang Involvement:{PSY:21197}  Subjective:   Erica Mcdaniel participated from {Patient Location:[address]::home}, via phone, and consented to treatment. I discussed the limitations of evaluation and management by telemedicine and the availability of in person appointments. The patient expressed understanding and agreed to proceed.  Therapist participated from home office.  Erica Mcdaniel reviewed the events of the past week.   ***   Interventions: {PSY:508 777 4699}  Diagnosis:  No diagnosis found.  Psychiatric Treatment: {YES/NO:21197}, ***  Treatment Plan:  Client Abilities/Strengths Erica Mcdaniel ***  Support System: ***  Client Treatment Preferences ***  Client Statement of Needs Erica Mcdaniel would like to ***   Treatment Level {Frequency of sessions.:26745}  Symptoms  ***   (Status: {Symptom Status:26744}) ***   (Status: {Symptom Status:26744})  Goals:   Erica Mcdaniel experiences symptoms  of ***   Target Date: *** Frequency: {Frequency of sessions.:26745}  Progress: 0 Modality: individual    Therapist will provide referrals for additional resources as appropriate.  Therapist will provide psycho-education regarding Erica Mcdaniel's diagnosis and corresponding treatment approaches and interventions. Erica Mcdaniel will support the patient's ability to achieve the goals identified. will employ CBT, BA, Problem-solving, Solution Focused, Mindfulness,  coping skills, & other evidenced-based practices will be used to promote progress towards healthy functioning to help manage decrease symptoms associated with {his/her/their:21314} diagnosis.   Reduce overall level, frequency, and intensity of the feelings of depression, anxiety and panic evidenced by decreased *** from 6 to 7 days/week to 0 to 1 days/week per client report for at least 3 consecutive months. Verbally express understanding of the relationship between feelings of ***depression, ***anxiety and their impact on thinking patterns and behaviors. Verbalize an understanding of the role that distorted thinking plays in creating fears, excessive worry, and ruminations.  Erica Mcdaniel participated in the creation of the treatment plan)   Erica Mcdaniel Erica Mcdaniel

## 2023-09-05 ENCOUNTER — Encounter: Admitting: Psychology

## 2023-09-05 NOTE — Progress Notes (Deleted)
 [facility] Counselor/Therapist Progress Note  Patient ID: [name], MRN: [id number]    Date: 09/05/23  Time Spent: 11:00 am -  {onmampm:26702} - *** {onmampm:26702} : *** Minutes  Treatment Type: Individual Therapy.  Reported Symptoms: ***  Mental Status Exam: Appearance:  {PSY:22683}     Behavior: {PSY:21022743}  Motor: {PSY:22302}  Speech/Language:  {PSY:22685}  Affect: {PSY:22687}  Mood: {PSY:31886}  Thought process: {PSY:31888}  Thought content:   {PSY:520-009-7750}  Sensory/Perceptual disturbances:   {PSY:970-644-3513}  Orientation: {PSY:30297}  Attention: {PSY:22877}  Concentration: {PSY:437-830-9785}  Memory: {PSY:505-716-3363}  Fund of knowledge:  {PSY:437-830-9785}  Insight:   {PSY:437-830-9785}  Judgment:  {PSY:437-830-9785}  Impulse Control: {PSY:437-830-9785}   Risk Assessment: Danger to Self:  {PSY:22692} Self-injurious Behavior: {PSY:22692} Danger to Others: {PSY:22692} Duty to Warn:{PSY:311194} Physical Aggression / Violence:{PSY:21197} Access to Firearms a concern: {PSY:21197} Gang Involvement:{PSY:21197}  Subjective:   Erica Mcdaniel participated from {Patient Location:[address]::home}, via {ONMVIDEOORPHONE:26699}, and consented to treatment. I discussed the limitations of evaluation and management by telemedicine and the availability of in person appointments. The patient expressed understanding and agreed to proceed.  Therapist participated from {onmtherapistlocation:[address]}.  Sanye reviewed the events of the past week.   ***   Interventions: {PSY:337 408 5824}  Diagnosis:  No diagnosis found.  Psychiatric Treatment: {YES/NO:21197}, ***  Treatment Plan:  Client Abilities/Strengths Erica Mcdaniel ***  Support System: ***  Client Treatment Preferences ***  Client Statement of Needs Erica Mcdaniel would like to ***   Treatment Level {Frequency of sessions.:26745}  Symptoms  ***   (Status: {Symptom Status:26744}) ***   (Status:  {Symptom Status:26744})  Goals:   Erica Mcdaniel experiences symptoms of ***   Target Date: *** Frequency: {Frequency of sessions.:26745}  Progress: 0 Modality: individual    Therapist will provide referrals for additional resources as appropriate.  Therapist will provide psycho-education regarding Erica Mcdaniel's diagnosis and corresponding treatment approaches and interventions. [name] will support the patient's ability to achieve the goals identified. will employ CBT, BA, Problem-solving, Solution Focused, Mindfulness,  coping skills, & other evidenced-based practices will be used to promote progress towards healthy functioning to help manage decrease symptoms associated with {his/her/their:21314} diagnosis.   Reduce overall level, frequency, and intensity of the feelings of depression, anxiety and panic evidenced by decreased *** from 6 to 7 days/week to 0 to 1 days/week per client report for at least 3 consecutive months. Verbally express understanding of the relationship between feelings of ***depression, ***anxiety and their impact on thinking patterns and behaviors. Verbalize an understanding of the role that distorted thinking plays in creating fears, excessive worry, and ruminations.  Erica Mcdaniel participated in the creation of the treatment plan)   [name] [name]

## 2023-09-05 NOTE — Progress Notes (Signed)
 This encounter was created in error - please disregard.

## 2023-09-11 ENCOUNTER — Ambulatory Visit (INDEPENDENT_AMBULATORY_CARE_PROVIDER_SITE_OTHER): Admitting: Psychology

## 2023-09-11 DIAGNOSIS — F419 Anxiety disorder, unspecified: Secondary | ICD-10-CM

## 2023-09-11 DIAGNOSIS — F32A Depression, unspecified: Secondary | ICD-10-CM

## 2023-09-11 NOTE — Progress Notes (Signed)
   [facility] Counselor/Therapist Progress Note  Patient ID: MEIA EMLEY, MRN: [id number]    Date: 09/11/23  Time Spent: 11:02 am - 11:55 am : 53 minutes (Phone session with patient. Patient had to hang up but dialed back in within a minute)  Treatment Type: Individual Therapy.  Reported Symptoms: Anxiety and depression [F41.9, F32.A]   Mental Status Exam: Appearance:  Well Groomed     Behavior: Appropriate  Motor: Normal  Speech/Language:  Normal Rate  Affect: Appropriate  Mood: normal  Thought process: normal  Thought content:   WNL  Sensory/Perceptual disturbances:   WNL  Orientation: oriented to person, place, and time/date  Attention: Good  Concentration: Good  Memory: WNL  Fund of knowledge:  Good  Insight:   Good  Judgment:  Good  Impulse Control: Good   Risk Assessment: Danger to Self:  No Self-injurious Behavior: No Danger to Others: No Duty to Warn:no Physical Aggression / Violence:No  Access to Firearms a concern: No  Gang Involvement:No   Subjective:   [name] participated from home, via phone, and consented to treatment. I discussed the limitations of evaluation and management by telemedicine and the availability of in person appointments. The patient expressed understanding and agreed to proceed.  Therapist participated from home office.  Natoya reviewed the events of the past week.   Patient was happy that her son Leontine and proposed to his girlfriend Meleeah, and that they planned to be married this November. Patient reported how difficult the stroke has been for her to deal with for the past two years. Patient was very open to discussing her ups and downs and how she would lkie to make progress.   Interventions: Cognitive Behavioral Therapy  Diagnosis:  Anxiety and depression [F41.9, F32.A]   Psychiatric Treatment: No   Treatment Plan:  Client Abilities/Strengths Jemila is motivated and open about how much  her life has changed since the stroke two years ago  Support System: Family  Client Treatment Preferences OPT  Client Statement of Needs Tata would like to be able to read more than she has since the stroke.   Treatment Level Weekly  Symptoms  Anxiety (Status: maintained) Depression (Status: maintained)  Goals:   Sherrelle experiences symptoms of anxiety and depression   Target Date: 07/08/24 Frequency: Weekly  Progress: 0 Modality: individual    Therapist will provide referrals for additional resources as appropriate.  Therapist will provide psycho-education regarding Carmita's diagnosis and corresponding treatment approaches and interventions. [name] will support the patient's ability to achieve the goals identified. will employ CBT, BA, Problem-solving, Solution Focused, Mindfulness,  coping skills, & other evidenced-based practices will be used to promote progress towards healthy functioning to help manage decrease symptoms associated with their diagnosis.   Reduce overall level, frequency, and intensity of the feelings of depression, anxiety and panic evidenced by decreased overall symptoms from 6 to 7 days/week to 0 to 1 days/week per client report for at least 3 consecutive months. Verbally express understanding of the relationship between feelings of depression and anxiety and their impact on thinking patterns and behaviors. Verbalize an understanding of the role that distorted thinking plays in creating fears, excessive worry, and ruminations.  [name] participated in the creation of the treatment plan)    [name]

## 2023-09-16 NOTE — Progress Notes (Signed)
 Remote Loop Recorder Transmission

## 2023-09-18 ENCOUNTER — Ambulatory Visit (INDEPENDENT_AMBULATORY_CARE_PROVIDER_SITE_OTHER)

## 2023-09-18 ENCOUNTER — Ambulatory Visit: Admitting: Psychology

## 2023-09-18 DIAGNOSIS — I639 Cerebral infarction, unspecified: Secondary | ICD-10-CM | POA: Diagnosis not present

## 2023-09-18 DIAGNOSIS — F32A Depression, unspecified: Secondary | ICD-10-CM

## 2023-09-18 DIAGNOSIS — F419 Anxiety disorder, unspecified: Secondary | ICD-10-CM | POA: Diagnosis not present

## 2023-09-18 LAB — CUP PACEART REMOTE DEVICE CHECK
Date Time Interrogation Session: 20250820234057
Implantable Pulse Generator Implant Date: 20240115

## 2023-09-18 NOTE — Progress Notes (Signed)
 [facility] Counselor/Therapist Progress Note  Patient ID: Erica Mcdaniel, MRN: [id number]    Date: 09/18/23  Time Spent: 10:00 am - 10:17 am : 17 Minutes (phone meeting) we ended early due to patient having a headache which she thought was from allergies.   Treatment Type: Individual Therapy.  Reported Symptoms: Anxiety and Depression (F41.9, F32 A)   Mental Status Exam: Appearance:  Well Groomed     Behavior: Appropriate  Motor: Normal  Speech/Language:  Slow  Affect: Flat  Mood: normal  Thought process: normal  Thought content:   WNL  Sensory/Perceptual disturbances:   WNL  Orientation: oriented to person, place, and time/date  Attention: Good  Concentration: Good  Memory: Impaired cognitive from stroke  Fund of knowledge:  Fair  Insight:   Good  Judgment:  Good  Impulse Control: Good   Risk Assessment: Danger to Self:  No Self-injurious Behavior: No Danger to Others: No Duty to Warn:no Physical Aggression / Violence:No  Access to Firearms a concern: No  Gang Involvement:No   Subjective:   Erica Mcdaniel participated from home, via phone, and consented to treatment. I discussed the limitations of evaluation and management by telemedicine and the availability of in person appointments. The patient expressed understanding and agreed to proceed.  Therapist participated from home office.  Erica Mcdaniel reviewed the events of the past week.   Patient reported not feeling well and she thought the headache she had was due to allergies. Patient continues to feel anxious and depressed and was able to vebalize that she was going to lay down and practice stress reduction techniques.    Interventions: Cognitive Behavioral Therapy  Diagnosis:  Anxiety and depression [F41.9, F32.A]   Psychiatric Treatment: No   Treatment Plan:  Client Abilities/Strengths Erica Mcdaniel is motivated and open  Support System: Family  Client Treatment  Preferences OPT  Client Statement of Needs Erica Mcdaniel would like to be able to read more than she has since the stroke.    Treatment Level Weekly  Symptoms  Anxiety (Status: maintained) Depression (Status: maintained)  Goals:  Target Date: 07/08/24 Frequency: Weekly  Progress: 0 Modality: individual    Therapist will provide referrals for additional resources as appropriate.  Therapist will provide psycho-education regarding Erica Mcdaniel diagnosis and corresponding treatment approaches and interventions. Erica Mcdaniel will support the patient's ability to achieve the goals identified. will employ CBT, BA, Problem-solving, Solution Focused, Mindfulness,  coping skills, & other evidenced-based practices will be used to promote progress towards healthy functioning to help manage decrease symptoms associated with their diagnosis.   Reduce overall level, frequency, and intensity of the feelings of depression, anxiety and panic evidenced by decreased overall symptoms from 6 to 7 days/week to 0 to 1 days/week per client report for at least 3 consecutive months. Verbally express understanding of the relationship between feelings of depression and anxiety and their impact on thinking patterns and behaviors. Verbalize an understanding of the role that distorted thinking plays in creating fears, excessive worry, and ruminations.  Erica Mcdaniel participated in the creation of the treatment plan)    Erica Mcdaniel

## 2023-09-22 ENCOUNTER — Ambulatory Visit: Payer: Self-pay | Admitting: Cardiovascular Disease

## 2023-09-23 ENCOUNTER — Ambulatory Visit (INDEPENDENT_AMBULATORY_CARE_PROVIDER_SITE_OTHER)

## 2023-09-23 VITALS — Ht 68.0 in | Wt 255.0 lb

## 2023-09-23 DIAGNOSIS — Z0000 Encounter for general adult medical examination without abnormal findings: Secondary | ICD-10-CM | POA: Diagnosis not present

## 2023-09-23 DIAGNOSIS — Z1231 Encounter for screening mammogram for malignant neoplasm of breast: Secondary | ICD-10-CM | POA: Diagnosis not present

## 2023-09-23 NOTE — Progress Notes (Signed)
 Subjective:   Erica Mcdaniel is a 53 y.o. who presents for a Medicare Wellness preventive visit.  As a reminder, Annual Wellness Visits don't include a physical exam, and some assessments may be limited, especially if this visit is performed virtually. We may recommend an in-person follow-up visit with your provider if needed.  Visit Complete: Virtual I connected with  Erica Mcdaniel on 09/23/23 by a audio enabled telemedicine application and verified that I am speaking with the correct person using two identifiers.  Patient Location: Home  Provider Location: Home Office  I discussed the limitations of evaluation and management by telemedicine. The patient expressed understanding and agreed to proceed.  Vital Signs: Because this visit was a virtual/telehealth visit, some criteria may be missing or patient reported. Any vitals not documented were not able to be obtained and vitals that have been documented are patient reported.  VideoDeclined- This patient declined Librarian, academic. Therefore the visit was completed with audio only.  Persons Participating in Visit: Patient.  AWV Questionnaire: No: Patient Medicare AWV questionnaire was not completed prior to this visit.  Cardiac Risk Factors include: hypertension;sedentary lifestyle     Objective:    Today's Vitals   09/23/23 1109  Weight: 255 lb (115.7 kg)  Height: 5' 8 (1.727 m)   Body mass index is 38.77 kg/m.     09/23/2023   11:30 AM 04/18/2016    7:58 AM 02/09/2016    4:36 PM 12/08/2012    9:12 AM 12/01/2012    2:16 PM  Advanced Directives  Does Patient Have a Medical Advance Directive? Yes No  No  Patient does not have advance directive  Patient does not have advance directive   Type of Advance Directive Healthcare Power of [place];Living will      Does patient want to make changes to medical advance directive? No - Patient declined      Copy of Healthcare Power of Attorney  in Chart? Yes - validated most recent copy scanned in chart (See row information)         Data saved with a previous flowsheet row definition    Current Medications (verified) Outpatient Encounter Medications as of 09/23/2023  Medication Sig   aspirin  81 MG chewable tablet Place 1 tablet (81 mg total) into feeding tube daily.   Baclofen  5 MG TABS Take 1 tablet (5 mg total) by mouth 3 (three) times daily as needed.   busPIRone  (BUSPAR ) 15 MG tablet Take 1 tablet by mouth twice daily   escitalopram  (LEXAPRO ) 10 MG tablet Take 1 tablet (10 mg total) by mouth daily.   esomeprazole  (NEXIUM ) 40 MG capsule Take 1 capsule (40 mg total) by mouth daily.   ezetimibe  (ZETIA ) 10 MG tablet Take 1 tablet (10 mg total) by mouth daily.   lisinopril  (ZESTRIL ) 20 MG tablet Take 1 tablet (20 mg total) by mouth daily.   nystatin  (MYCOSTATIN /NYSTOP ) powder APPLY  POWDER TOPICALLY TWICE DAILY   rosuvastatin  (CRESTOR ) 20 MG tablet Take 1 tablet (20 mg total) by mouth daily.   terbinafine  (LAMISIL ) 250 MG tablet Take 1 tablet (250 mg total) by mouth daily.   No facility-administered encounter medications on file as of 09/23/2023.    Allergies (verified) Clarithromycin   History: Past Medical History:  Diagnosis Date   Acute ischemic left MCA stroke (HCC) 09/25/2021   Anxiety    Arthritis    Cancer (HCC)    skin squamous cell arm   Depression  Fibromyalgia    GERD (gastroesophageal reflux disease)    Hypertension    IBS (irritable bowel syndrome)    Pneumonia    hx   Polycystic ovarian disease    Tendonitis    Tubular adenoma of colon 2018   multiple   Past Surgical History:  Procedure Laterality Date   APPENDECTOMY     BACK SURGERY     BACK SURGERY  2010   rods put in   IR ANGIO INTRA EXTRACRAN SEL COM CAROTID INNOMINATE UNI R MOD SED  09/25/2021   IR CT HEAD LTD  09/25/2021   IR CT HEAD LTD  09/25/2021   IR INTRAVSC STENT CERV CAROTID W/O EMB-PROT MOD SED INC ANGIO  09/25/2021   IR  PERCUTANEOUS ART THROMBECTOMY/INFUSION INTRACRANIAL INC DIAG ANGIO  09/25/2021   IR US  GUIDE VASC ACCESS LEFT  09/25/2021   IR US  GUIDE VASC ACCESS RIGHT  09/25/2021   KNEE ARTHROSCOPY Left 13   NEUROPLASTY / TRANSPOSITION MEDIAN NERVE AT CARPAL TUNNEL BILATERAL     OVARIAN CYST SURGERY  1983   RADIOLOGY WITH ANESTHESIA N/A 09/25/2021   Procedure: IR WITH ANESTHESIA;  Surgeon: Dolphus Carrion, MD;  Location: [facility] OR;  Service: Radiology;  Laterality: N/A;   ROTATOR CUFF REPAIR  2003,04   right, and elbow,left   TENDON RECONSTRUCTION Right 12/08/2012   Procedure: ELBOW TENDON RECONSTRUCTION/Right Lateral Epicondylar Debridement with Repair.;  Surgeon: Maude LELON Right, MD;  [facility];  Service: Orthopedics;  Laterality: Right;  Right Lateral Epicondylar Debridement with Repair.   TRIGGER FINGER RELEASE Right 08   Family History  Problem Relation Age of Onset   Lung cancer Mother        meso   Arthritis Mother    Thyroid  disease Father    Arthritis Father    Arthritis Sister    Heart disease Brother    Arthritis Brother    Colon polyps Brother    Hyperlipidemia Brother    Diabetes Maternal Grandmother    Diabetes Maternal Grandfather    Heart attack Maternal Grandfather    Colon cancer Neg Hx    Uterine cancer Neg Hx    Stomach cancer Neg Hx    Rectal cancer Neg Hx    Social History   Socioeconomic History   Marital status: Single    Spouse name: Not on file   Number of children: 1   Years of education: Not on file   Highest education level: Not on file  Occupational History   Occupation: disability  Tobacco Use   Smoking status: Former    Current packs/day: 0.75    Average packs/day: 0.8 packs/day for 20.0 years (15.0 ttl pk-yrs)    Types: Cigarettes   Smokeless tobacco: Never   Tobacco comments:    taking wellbutrin  to help quitting  use vqpor cig now  Vaping Use   Vaping status: Never Used  Substance and Sexual Activity   Alcohol use: No   Drug use: No    Sexual activity: Not on file  Other Topics Concern   Not on file  Social History Narrative   Not on file   Social Drivers of Health   Financial Resource Strain: Low Risk  (09/23/2023)   Overall Financial Resource Strain (CARDIA)    Difficulty of Paying Living Expenses: Not hard at all  Food Insecurity: No Food Insecurity (09/23/2023)   Hunger Vital Sign    Worried About Running Out of Food in the Last Year: Never true  Ran Out of Food in the Last Year: Never true  Transportation Needs: No Transportation Needs (09/23/2023)   PRAPARE - Administrator, Civil Service (Medical): No    Lack of Transportation (Non-Medical): No  Physical Activity: Inactive (09/23/2023)   Exercise Vital Sign    Days of Exercise per Week: 0 days    Minutes of Exercise per Session: 0 min  Stress: No Stress Concern Present (09/23/2023)   [employer] of Occupational Health - Occupational Stress Questionnaire    Feeling of Stress: Not at all  Social Connections: Unknown (09/23/2023)   Social Connection and Isolation Panel    Frequency of Communication with Friends and Family: More than three times a week    Frequency of Social Gatherings with Friends and Family: Twice a week    Attends Religious Services: Never    Database administrator or Organizations: No    Attends Engineer, structural: Never    Marital Status: Not on file    Tobacco Counseling Counseling given: Not Answered Tobacco comments: taking wellbutrin  to help quitting  use vqpor cig now    Clinical Intake:  Pre-visit preparation completed: Yes  Pain : No/denies pain  Diabetes: No  Lab Results  Component Value Date   HGBA1C 5.7 (H) 05/30/2023   HGBA1C 5.8 (H) 09/25/2021     How often do you need to have someone help you when you read instructions, pamphlets, or other written materials from your doctor or pharmacy?: 1 - Never  Interpreter Needed?: No  Information entered by :: Charmaine Bloodgood  LPN   Activities of Daily Living     09/23/2023   11:30 AM  In your present state of health, do you have any difficulty performing the following activities:  Hearing? 0  Vision? 0  Difficulty concentrating or making decisions? 0  Walking or climbing stairs? 1  Dressing or bathing? 0  Doing errands, shopping? 0  Preparing Food and eating ? N  Using the Toilet? N  In the past six months, have you accidently leaked urine? N  Do you have problems with loss of bowel control? N  Managing your Medications? N  Managing your Finances? N  Housekeeping or managing your Housekeeping? N    Patient Care Team: Cyndi Shaver, PA-C as PCP - General (Physician Assistant) Mealor, Eulas BRAVO, MD as PCP - Electrophysiology (Cardiology) Janiece Sonny DELENA OLYMPIA (Ophthalmology) Maudie Jenkins HERO as Counselor (Psychology) Loel Awanda BIRCH, DPM as Consulting Physician (Podiatry)  I have updated your Care Teams any recent Medical Services you may have received from other providers in the past year.     Assessment:   This is a routine wellness examination for Korie.  Hearing/Vision screen Hearing Screening - Comments:: Denies hearing difficulties   Vision Screening - Comments:: Wears rx glasses - up to date with routine eye exams with [facility].    Goals Addressed             This Visit's Progress    Maintain health and independence   On track      Depression Screen     09/23/2023   11:28 AM 05/30/2023    2:38 PM  PHQ 2/9 Scores  PHQ - 2 Score 4 5  PHQ- 9 Score 8 11    Fall Risk     09/23/2023   11:29 AM  Fall Risk   Falls in the past year? 0  Number falls in past yr: 0  Injury with Fall?  0  Risk for fall due to : No Fall Risks  Follow up Falls prevention discussed;Education provided;Falls evaluation completed    MEDICARE RISK AT HOME:  Medicare Risk at Home Any stairs in or around the home?: No If so, are there any without handrails?: No Home free of loose throw rugs in  walkways, pet beds, electrical cords, etc?: Yes Adequate lighting in your home to reduce risk of falls?: Yes Life alert?: No Use of a cane, walker or w/c?: Yes Grab bars in the bathroom?: Yes Shower chair or bench in shower?: Yes Elevated toilet seat or a handicapped toilet?: Yes  TIMED UP AND GO:  Was the test performed?  No  Cognitive Function: 6CIT completed        09/23/2023   11:30 AM  6CIT Screen  What Year? 0 points  What month? 0 points  What time? 0 points  Count back from 20 0 points  Months in reverse 0 points  Repeat phrase 0 points  Total Score 0 points    Immunizations Immunization History  Administered Date(s) Administered   Td 09/29/1998    Screening Tests Health Maintenance  Topic Date Due   Hepatitis C Screening  Never done   Hepatitis B Vaccines 19-59 Average Risk (1 of 3 - 19+ 3-dose series) Never done   DTaP/Tdap/Td (2 - Tdap) 09/28/2008   Pneumococcal Vaccine: 50+ Years (1 of 1 - PCV) Never done   Zoster Vaccines- Shingrix (1 of 2) Never done   Colonoscopy  02/21/2019   COVID-19 Vaccine (1 - 2024-25 season) Never done   MAMMOGRAM  05/10/2023   INFLUENZA VACCINE  08/29/2023   Medicare Annual Wellness (AWV)  09/22/2024   Cervical Cancer Screening (HPV/Pap Cotest)  11/30/2025   HIV Screening  Completed   HPV VACCINES  Aged Out   Meningococcal B Vaccine  Aged Out    Health Maintenance  Health Maintenance Due  Topic Date Due   Hepatitis C Screening  Never done   Hepatitis B Vaccines 19-59 Average Risk (1 of 3 - 19+ 3-dose series) Never done   DTaP/Tdap/Td (2 - Tdap) 09/28/2008   Pneumococcal Vaccine: 50+ Years (1 of 1 - PCV) Never done   Zoster Vaccines- Shingrix (1 of 2) Never done   Colonoscopy  02/21/2019   COVID-19 Vaccine (1 - 2024-25 season) Never done   MAMMOGRAM  05/10/2023   INFLUENZA VACCINE  08/29/2023   Health Maintenance Items Addressed: Patient declines vaccines;  Mammogram ordered   Additional Screening:  Vision  Screening: Recommended annual ophthalmology exams for early detection of glaucoma and other disorders of the eye. Would you like a referral to an eye doctor? No    Dental Screening: Recommended annual dental exams for proper oral hygiene  Community Resource Referral / Chronic Care Management: CRR required this visit?  No   CCM required this visit?  No   Plan:    I have personally reviewed and noted the following in the patient's chart:   Medical and social history Use of alcohol, tobacco or illicit drugs  Current medications and supplements including opioid prescriptions. Patient is not currently taking opioid prescriptions. Functional ability and status Nutritional status Physical activity Advanced directives List of other physicians Hospitalizations, surgeries, and ER visits in previous 12 months Vitals Screenings to include cognitive, depression, and falls Referrals and appointments  In addition, I have reviewed and discussed with patient certain preventive protocols, quality metrics, and best practice recommendations. A written personalized care plan for preventive  services as well as general preventive health recommendations were provided to patient.   Lavelle Pfeiffer [place], [place]   1/73/7974   After Visit Summary: (MyChart) Due to this being a telephonic visit, the after visit summary with patients personalized plan was offered to patient via MyChart   Notes: Nothing significant to report at this time.

## 2023-09-23 NOTE — Patient Instructions (Signed)
 Erica Mcdaniel , Thank you for taking time out of your busy schedule to complete your Annual Wellness Visit with me. I enjoyed our conversation and look forward to speaking with you again next year. I, as well as your care team,  appreciate your ongoing commitment to your health goals. Please review the following plan we discussed and let me know if I can assist you in the future. Your Game plan/ To Do List    Referrals: You have an order for:  []   2D Mammogram  [x]   3D Mammogram  []   Bone Density     Please call for appointment:   [facility] [address] [place], [place] [address] [phone]   Make sure to wear two-piece clothing.  No lotions, powders, or deodorants the day of the appointment. Make sure to bring picture ID and insurance card.  Bring list of medications you are currently taking including any supplements.    Follow up Visits: We will see or speak with you next year for your Next Medicare AWV with our clinical staff Have you seen your provider in the last 6 months (3 months if uncontrolled diabetes)? Yes  Clinician Recommendations:  Aim for 30 minutes of exercise or brisk walking, 6-8 glasses of water , and 5 servings of fruits and vegetables each day.       This is a list of the screenings recommended for you:  Health Maintenance  Topic Date Due   Hepatitis C Screening  Never done   Hepatitis B Vaccine (1 of 3 - 19+ 3-dose series) Never done   DTaP/Tdap/Td vaccine (2 - Tdap) 09/28/2008   Pneumococcal Vaccine for age over 28 (1 of 1 - PCV) Never done   Zoster (Shingles) Vaccine (1 of 2) Never done   Colon Cancer Screening  02/21/2019   COVID-19 Vaccine (1 - 2024-25 season) Never done   Mammogram  05/10/2023   Flu Shot  08/29/2023   Medicare Annual Wellness Visit  09/22/2024   Pap with HPV screening  11/30/2025   HIV Screening  Completed   HPV Vaccine  Aged Out   Meningitis B Vaccine  Aged Out    Advanced directives: (In Chart)  A copy of your advanced directives are scanned into your chart should your provider ever need it.  Advance Care Planning is important because it:  [x]  Makes sure you receive the medical care that is consistent with your values, goals, and preferences  [x]  It provides guidance to your family and loved ones and reduces their decisional burden about whether or not they are making the right decisions based on your wishes.  Follow the link provided in your after visit summary or read over the paperwork we have mailed to you to help you started getting your Advance Directives in place. If you need assistance in completing these, please reach out to us  so that we can help you!  See attachments for Preventive Care and Fall Prevention Tips.

## 2023-09-25 ENCOUNTER — Ambulatory Visit (INDEPENDENT_AMBULATORY_CARE_PROVIDER_SITE_OTHER): Admitting: Psychology

## 2023-09-25 DIAGNOSIS — F32A Depression, unspecified: Secondary | ICD-10-CM

## 2023-09-25 DIAGNOSIS — F419 Anxiety disorder, unspecified: Secondary | ICD-10-CM | POA: Diagnosis not present

## 2023-09-25 NOTE — Progress Notes (Signed)
 [facility] Counselor/Therapist Progress Note  Patient ID: NOU CHARD, MRN: [id number]    Date: 09/25/23  Time Spent: 10:02 am - 10:55 am : 53 minutes  (Phone)   Treatment Type: Individual Therapy.  Reported Symptoms: Anxiety and Depression F41.9, F32 A  Mental Status Exam: Appearance:  NA     Behavior: Appropriate  Motor: Patient uses a cane  Speech/Language:  Slow  Affect: Flat  Mood: normal  Thought process: normal  Thought content:   WNL  Sensory/Perceptual disturbances:   WNL  Orientation: oriented to person, place, and time/date  Attention: Good  Concentration: Fair  Memory: Impaired from stroke  Progress Energy of knowledge:  Fair  Insight:   Good  Judgment:  Good  Impulse Control: Good   Risk Assessment: Danger to Self:  No Self-injurious Behavior: No Danger to Others: No Duty to Warn:no Physical Aggression / Violence:No  Access to Firearms a concern: No  Gang Involvement:No   Subjective:   [name] participated from home, via phone, and consented to treatment. I discussed the limitations of evaluation and management by telemedicine and the availability of in person appointments. The patient expressed understanding and agreed to proceed.  Therapist participated from home office.  Moncerrat reviewed the events of the past week.   Patient reported that today was the two year anniversary of her stroke, and she shared the emotions that she was feeling since then. 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 Patient continues to try not to worry about everything, and she is receptive to focusing on self care. We referenced Workbook for Cognitive Skills by Devere Joeann Gerlach, (100) .  Interventions: Cognitive Behavioral Therapy  Diagnosis: Anxiety and Depression F41.9, F32 A  Psychiatric Treatment: No   Treatment Plan:  Client Abilities/Strengths Atiana is  motivated and open  Support System: Family  Client Treatment Preferences OPT  Client Statement of Needs Cybele would like to be able to read more than she has since the stroke.    Treatment Level Weekly  Symptoms  Cullen experiences symptoms of anxiety and depression.  Goals:   Target Date: 07/08/24 Frequency: Weekly  Progress: 0 Modality: individual    Therapist will provide referrals for additional resources as appropriate.  Therapist will provide psycho-education regarding Trenton's diagnosis and corresponding treatment approaches and interventions. [name] will support the patient's ability to achieve the goals identified. will employ CBT, BA, Problem-solving, Solution Focused, Mindfulness,  coping skills, & other evidenced-based practices will be used to promote progress towards healthy functioning to help manage decrease symptoms associated with their diagnosis.   Reduce overall level, frequency, and intensity of the feelings of depression, anxiety and panic evidenced by decreased overall symptoms from 6 to 7 days/week to 0 to 1 days/week per client report for at least 3 consecutive months. Verbally express understanding of the relationship between feelings of depression and anxiety and their impact on thinking patterns and behaviors. Verbalize an understanding of the role that distorted thinking plays in creating fears, excessive worry, and ruminations.  [name] participated in the creation of the treatment plan)     [name]

## 2023-10-02 ENCOUNTER — Ambulatory Visit: Admitting: Psychology

## 2023-10-02 ENCOUNTER — Encounter: Payer: Self-pay | Admitting: Physician Assistant

## 2023-10-03 ENCOUNTER — Ambulatory Visit (INDEPENDENT_AMBULATORY_CARE_PROVIDER_SITE_OTHER): Admitting: Podiatry

## 2023-10-03 DIAGNOSIS — B351 Tinea unguium: Secondary | ICD-10-CM

## 2023-10-03 NOTE — Progress Notes (Signed)
 Subjective:  Patient ID: Erica Mcdaniel, female    DOB: 07-10-1970,  MRN: [id number]  Erica Mcdaniel presents to clinic today for fungal nail check on the right great toenail.  The nail was previously avulsed and sent to pathology and confirmed to have fungal involvement.  She had been placed on 3 months of oral terbinafine  and finished all the medication without side effect.  There is approximately 25% regrowth of the nail at this time.  PCP is Cyndi Shaver, PA-C.  Past Medical History:  Diagnosis Date   Acute ischemic left MCA stroke (HCC) 09/25/2021   Anxiety    Arthritis    Cancer (HCC)    skin squamous cell arm   Depression    Fibromyalgia    GERD (gastroesophageal reflux disease)    Hypertension    IBS (irritable bowel syndrome)    Pneumonia    hx   Polycystic ovarian disease    Tendonitis    Tubular adenoma of colon 2018   multiple   Past Surgical History:  Procedure Laterality Date   APPENDECTOMY     BACK SURGERY     BACK SURGERY  2010   rods put in   IR ANGIO INTRA EXTRACRAN SEL COM CAROTID INNOMINATE UNI R MOD SED  09/25/2021   IR CT HEAD LTD  09/25/2021   IR CT HEAD LTD  09/25/2021   IR INTRAVSC STENT CERV CAROTID W/O EMB-PROT MOD SED INC ANGIO  09/25/2021   IR PERCUTANEOUS ART THROMBECTOMY/INFUSION INTRACRANIAL INC DIAG ANGIO  09/25/2021   IR US  GUIDE VASC ACCESS LEFT  09/25/2021   IR US  GUIDE VASC ACCESS RIGHT  09/25/2021   KNEE ARTHROSCOPY Left 13   NEUROPLASTY / TRANSPOSITION MEDIAN NERVE AT CARPAL TUNNEL BILATERAL     OVARIAN CYST SURGERY  1983   RADIOLOGY WITH ANESTHESIA N/A 09/25/2021   Procedure: IR WITH ANESTHESIA;  Surgeon: Dolphus Carrion, MD;  Location: [facility] OR;  Service: Radiology;  Laterality: N/A;   ROTATOR CUFF REPAIR  2003,04   right, and elbow,left   TENDON RECONSTRUCTION Right 12/08/2012   Procedure: ELBOW TENDON RECONSTRUCTION/Right Lateral Epicondylar Debridement with Repair.;  Surgeon: Maude LELON Right, MD;  Location: [facility] OR;   Service: Orthopedics;  Laterality: Right;  Right Lateral Epicondylar Debridement with Repair.   TRIGGER FINGER RELEASE Right 08   Allergies  Allergen Reactions   Clarithromycin Hives and Rash    Review of Systems: Negative except as noted in the HPI.  Objective:  Vascular Examination: Capillary refill time is 3-5 seconds to toes bilateral. Palpable pedal pulses b/l LE. Digital hair present b/l.    Dermatological Examination: Pedal skin with normal turgor, texture and tone b/l. No open wounds. No interdigital macerations b/l.  The right hallux nail shows approximately 25% regrowth with some proximal clearing along the proximal 10%.  No pain is noted within the nail.     Latest Ref Rng & Units 05/30/2023    2:39 PM  Hemoglobin A1C  Hemoglobin-A1c <5.7 % 5.7    Assessment/Plan: 1. Fungal nail infection     The right hallux nail was debrided to decrease thickness.  Patient informed that she should start seeing clear nail as the nail continues to grow in.  She is now in a wait and see period.  The medication will remain in the soft tissues surrounding the nailbed for up to 1 year and provide continuous improvement as the new nail grows in  Patient to continue with current  treatment plan for the fungal nails as there is improvement noted.  Follow-up in 3 to 4 months  Marchella Hibbard D. Adlai Sinning, DPM, FACFAS [facility]     [address] [place], [place] [address]                Office [phone]  Fax [phone]

## 2023-10-07 ENCOUNTER — Telehealth: Payer: Self-pay

## 2023-10-07 NOTE — Telephone Encounter (Signed)
 Copied from CRM #[id number]. Topic: Referral - Question >> Oct 07, 2023  9:10 AM Anairis L wrote: Reason for CRM: The current mental health provider patient is seeing, she is not satisfied. Pt was wonder ing if she could get a new referral. Maybe [facility] in [place] 

## 2023-10-07 NOTE — Telephone Encounter (Signed)
 Pt son Leontine returned phone. Is questioning why she needs an appt to get a referral for an AFO repair. I explained that if she needs th referral we need a dr appt but he is stating that is it just for a repair and shouldn't have to come in to the office. Please call and explain tomorrow after 330pm [phone]

## 2023-10-07 NOTE — Telephone Encounter (Signed)
 Spoke w/ Leontine- informed that Manuelita has left the clinic, informed process of referrals and orders. Appt scheduled this Friday w/ Harlene GRADE.

## 2023-10-07 NOTE — Telephone Encounter (Signed)
 Copied from CRM #[id number]. Topic: Clinical - Medical Advice >> Oct 07, 2023  9:07 AM Anairis L wrote: Reason for CRM: Patient would like a referral/script for a new or for AFO repaired. [phone]

## 2023-10-07 NOTE — Telephone Encounter (Signed)
 Tried calling Pt to let her know that PCP has left office, will need to schedule an appt w/ another provider in the office. No answer, unable to leave message.

## 2023-10-09 ENCOUNTER — Ambulatory Visit: Admitting: Psychology

## 2023-10-09 DIAGNOSIS — F419 Anxiety disorder, unspecified: Secondary | ICD-10-CM | POA: Diagnosis not present

## 2023-10-09 DIAGNOSIS — F32A Depression, unspecified: Secondary | ICD-10-CM | POA: Diagnosis not present

## 2023-10-09 DIAGNOSIS — R739 Hyperglycemia, unspecified: Secondary | ICD-10-CM | POA: Insufficient documentation

## 2023-10-09 NOTE — Progress Notes (Unsigned)
 Subjective:     Patient ID: Erica Mcdaniel, female    DOB: 18-Apr-1970, 53 y.o.   MRN: [id number]  No chief complaint on file.   HPI  Discussed the use of AI scribe software for clinical note transcription with the patient, who gave verbal consent to proceed.  Erica Mcdaniel is a 53 yo female who presents for follow-up chronic conditions.   Follows with podiatry-currently treated with terbinafine  for   HTN- lisinopril  20 mg daily BP at home-  HLD-Crestor  20 mg daily, Zetia  10 mg daily  Anxiety and depression BuSpar  15 mg twice daily She was started on Lexapro  10 mg daily last office visit and referred to psychology ??? Doing this  GERD-Nexium  40 mg daily?? Taking?    History of Present Illness              Health Maintenance Due  Topic Date Due   Hepatitis C Screening  Never done   Hepatitis B Vaccines 19-59 Average Risk (1 of 3 - 19+ 3-dose series) Never done   DTaP/Tdap/Td (2 - Tdap) 09/28/2008   Pneumococcal Vaccine: 50+ Years (1 of 1 - PCV) Never done   Zoster Vaccines- Shingrix (1 of 2) Never done   Colonoscopy  02/21/2019   Mammogram  05/10/2023   Influenza Vaccine  08/29/2023   COVID-19 Vaccine (1 - 2024-25 season) Never done    Past Medical History:  Diagnosis Date   Acute ischemic left MCA stroke (HCC) 09/25/2021   Anxiety    Arthritis    Cancer (HCC)    skin squamous cell arm   Depression    Fibromyalgia    GERD (gastroesophageal reflux disease)    Hypertension    IBS (irritable bowel syndrome)    Pneumonia    hx   Polycystic ovarian disease    Tendonitis    Tubular adenoma of colon 2018   multiple    Past Surgical History:  Procedure Laterality Date   APPENDECTOMY     BACK SURGERY     BACK SURGERY  2010   rods put in   IR ANGIO INTRA EXTRACRAN SEL COM CAROTID INNOMINATE UNI R MOD SED  09/25/2021   IR CT HEAD LTD  09/25/2021   IR CT HEAD LTD  09/25/2021   IR INTRAVSC STENT CERV CAROTID W/O EMB-PROT MOD SED INC ANGIO   09/25/2021   IR PERCUTANEOUS ART THROMBECTOMY/INFUSION INTRACRANIAL INC DIAG ANGIO  09/25/2021   IR US  GUIDE VASC ACCESS LEFT  09/25/2021   IR US  GUIDE VASC ACCESS RIGHT  09/25/2021   KNEE ARTHROSCOPY Left 13   NEUROPLASTY / TRANSPOSITION MEDIAN NERVE AT CARPAL TUNNEL BILATERAL     OVARIAN CYST SURGERY  1983   RADIOLOGY WITH ANESTHESIA N/A 09/25/2021   Procedure: IR WITH ANESTHESIA;  Surgeon: Dolphus Carrion, MD;  Location: [facility] OR;  Service: Radiology;  Laterality: N/A;   ROTATOR CUFF REPAIR  2003,04   right, and elbow,left   TENDON RECONSTRUCTION Right 12/08/2012   Procedure: ELBOW TENDON RECONSTRUCTION/Right Lateral Epicondylar Debridement with Repair.;  Surgeon: Maude LELON Right, MD;  Location: [facility] OR;  Service: Orthopedics;  Laterality: Right;  Right Lateral Epicondylar Debridement with Repair.   TRIGGER FINGER RELEASE Right 08    Family History  Problem Relation Age of Onset   Lung cancer Mother        meso   Arthritis Mother    Thyroid  disease Father    Arthritis Father    Arthritis Sister    Heart  disease Brother    Arthritis Brother    Colon polyps Brother    Hyperlipidemia Brother    Diabetes Maternal Grandmother    Diabetes Maternal Grandfather    Heart attack Maternal Grandfather    Colon cancer Neg Hx    Uterine cancer Neg Hx    Stomach cancer Neg Hx    Rectal cancer Neg Hx     Social History   Socioeconomic History   Marital status: Single    Spouse name: Not on file   Number of children: 1   Years of education: Not on file   Highest education level: Not on file  Occupational History   Occupation: disability  Tobacco Use   Smoking status: Former    Current packs/day: 0.75    Average packs/day: 0.8 packs/day for 20.0 years (15.0 ttl pk-yrs)    Types: Cigarettes   Smokeless tobacco: Never   Tobacco comments:    taking wellbutrin  to help quitting  use vqpor cig now  Vaping Use   Vaping status: Never Used  Substance and Sexual Activity   Alcohol use: No    Drug use: No   Sexual activity: Not on file  Other Topics Concern   Not on file  Social History Narrative   Not on file   Social Drivers of Health   Financial Resource Strain: Low Risk  (09/23/2023)   Overall Financial Resource Strain (CARDIA)    Difficulty of Paying Living Expenses: Not hard at all  Food Insecurity: No Food Insecurity (09/23/2023)   Hunger Vital Sign    Worried About Running Out of Food in the Last Year: Never true    Ran Out of Food in the Last Year: Never true  Transportation Needs: No Transportation Needs (09/23/2023)   PRAPARE - Administrator, Civil Service (Medical): No    Lack of Transportation (Non-Medical): No  Physical Activity: Inactive (09/23/2023)   Exercise Vital Sign    Days of Exercise per Week: 0 days    Minutes of Exercise per Session: 0 min  Stress: No Stress Concern Present (09/23/2023)   [employer] of Occupational Health - Occupational Stress Questionnaire    Feeling of Stress: Not at all  Social Connections: Unknown (09/23/2023)   Social Connection and Isolation Panel    Frequency of Communication with Friends and Family: More than three times a week    Frequency of Social Gatherings with Friends and Family: Twice a week    Attends Religious Services: Never    Database administrator or Organizations: No    Attends Banker Meetings: Never    Marital Status: Not on file  Intimate Partner Violence: Not At Risk (09/23/2023)   Humiliation, Afraid, Rape, and Kick questionnaire    Fear of Current or Ex-Partner: No    Emotionally Abused: No    Physically Abused: No    Sexually Abused: No    Outpatient Medications Prior to Visit  Medication Sig Dispense Refill   aspirin  81 MG chewable tablet Place 1 tablet (81 mg total) into feeding tube daily. 30 tablet 0   Baclofen  5 MG TABS Take 1 tablet (5 mg total) by mouth 3 (three) times daily as needed. 90 tablet 1   busPIRone  (BUSPAR ) 15 MG tablet Take 1 tablet by mouth  twice daily 180 tablet 0   escitalopram  (LEXAPRO ) 10 MG tablet Take 1 tablet (10 mg total) by mouth daily. 90 tablet 1   esomeprazole  (NEXIUM ) 40 MG capsule Take  1 capsule (40 mg total) by mouth daily. 90 capsule 1   ezetimibe  (ZETIA ) 10 MG tablet Take 1 tablet (10 mg total) by mouth daily. 90 tablet 3   lisinopril  (ZESTRIL ) 20 MG tablet Take 1 tablet (20 mg total) by mouth daily. 90 tablet 1   nystatin  (MYCOSTATIN /NYSTOP ) powder APPLY  POWDER TOPICALLY TWICE DAILY 60 g 3   rosuvastatin  (CRESTOR ) 20 MG tablet Take 1 tablet (20 mg total) by mouth daily. 90 tablet 1   terbinafine  (LAMISIL ) 250 MG tablet Take 1 tablet (250 mg total) by mouth daily. 90 tablet 0   No facility-administered medications prior to visit.    Allergies  Allergen Reactions   Clarithromycin Hives and Rash    ROS     Objective:    Physical Exam   There were no vitals taken for this visit. Wt Readings from Last 3 Encounters:  09/23/23 255 lb (115.7 kg)  05/30/23 255 lb 3.2 oz (115.8 kg)  04/30/23 258 lb 3.2 oz (117.1 kg)       Assessment & Plan:   Problem List Items Addressed This Visit   None   I am having Boykin CHARM Hoof Josie maintain her aspirin , rosuvastatin , ezetimibe , lisinopril , esomeprazole , nystatin , Baclofen , escitalopram , terbinafine , and busPIRone .  No orders of the defined types were placed in this encounter.

## 2023-10-09 NOTE — Progress Notes (Signed)
 [facility] Counselor/Therapist Progress Note  Patient ID: Erica Mcdaniel, MRN: [id number]    Date: 10/09/23  Time Spent: 10:30 - 11:00 a.m. : 30 minutes   Phone  Treatment Type: Individual Therapy.  Reported Symptoms:  Anxiety and Depression F41.9, F32 A   Mental Status Exam: Appearance:  NA     Behavior: Appropriate  Motor: Patient uses a cane  Speech/Language:  Slow  Affect: Flat  Mood: normal  Thought process: normal  Thought content:   WNL  Sensory/Perceptual disturbances:   WNL  Orientation: oriented to person, place, and time/date  Attention: Good  Concentration: Fair  Memory: Impaired frm stroke  Fund of knowledge:  Fair  Insight:   Good  Judgment:  Good  Impulse Control: Good   Risk Assessment: Danger to Self:  No Self-injurious Behavior: No Danger to Others: No Duty to Warn:no Physical Aggression / Violence:No  Access to Firearms a concern: No  Gang Involvement:No   Subjective:   Erica Mcdaniel participated from home, via phone, and consented to treatment. I discussed the limitations of evaluation and management by telemedicine and the availability of in person appointments. The patient expressed understanding and agreed to proceed.  Therapist participated from home office.  Willy reviewed the events of the past week.   Patient and this Clinical research associate worked in her CBT workbook and patient did several activities. While patient was doing the CBT activities, she talked more about feeling nervous and having trouble concentrating.   Interventions: Cognitive Behavioral Therapy  Diagnosis:  Anxiety and Depression F41.9, F32 A   Psychiatric Treatment: No   Treatment Plan:  Client Abilities/Strengths Erica Mcdaniel is motivated and open  Support System: Family  Client Treatment Preferences OPT  Client Statement of Needs Erica Mcdaniel would like to read more than she has since the stroke.    Treatment Level Weekly  Symptoms  Erica Mcdaniel  experiences symptoms of anxiety and depression.  Goals:   Target Date: 07/08/24 Frequency: Weekly  Progress: 15% Modality: individual    Therapist will provide referrals for additional resources as appropriate.  Therapist will provide psycho-education regarding Erica Mcdaniel's diagnosis and corresponding treatment approaches and interventions. Erica Mcdaniel will support the patient's ability to achieve the goals identified. will employ CBT, BA, Problem-solving, Solution Focused, Mindfulness,  coping skills, & other evidenced-based practices will be used to promote progress towards healthy functioning to help manage decrease symptoms associated with their diagnosis.   Reduce overall level, frequency, and intensity of the feelings of depression, anxiety and panic evidenced by decreased overall symptoms from 6 to 7 days/week to 0 to 1 days/week per client report for at least 3 consecutive months. Verbally express understanding of the relationship between feelings of depression and anxiety and their impact on thinking patterns and behaviors. Verbalize an understanding of the role that distorted thinking plays in creating fears, excessive worry, and ruminations.  Erica Mcdaniel participated in the creation of the treatment plan)    Erica Mcdaniel

## 2023-10-09 NOTE — Assessment & Plan Note (Addendum)
 Stable on current medications. Denies SI/HI.

## 2023-10-09 NOTE — Assessment & Plan Note (Signed)
 Residual word finding, short term memory changes, and RLE weakness and spasticity.  Graduated from PT, speech.  Follows with neuro, cardiology prn.  Manages with asa 81 mg, statin, zetia , ace-I. Muscle relaxants for spasticity.

## 2023-10-09 NOTE — Assessment & Plan Note (Addendum)
 Tolerating statin. Encourage heart healthy diet such as MIND or DASH diet, increase exercise, avoid trans fats, simple carbohydrates and processed foods, consider a krill or fish or flaxseed oil cap daily.

## 2023-10-10 ENCOUNTER — Ambulatory Visit (INDEPENDENT_AMBULATORY_CARE_PROVIDER_SITE_OTHER): Admitting: Student

## 2023-10-10 ENCOUNTER — Encounter: Payer: Self-pay | Admitting: Student

## 2023-10-10 VITALS — BP 123/85 | HR 92 | Ht 68.0 in | Wt 250.6 lb

## 2023-10-10 DIAGNOSIS — B351 Tinea unguium: Secondary | ICD-10-CM | POA: Diagnosis not present

## 2023-10-10 DIAGNOSIS — Z8673 Personal history of transient ischemic attack (TIA), and cerebral infarction without residual deficits: Secondary | ICD-10-CM | POA: Diagnosis not present

## 2023-10-10 DIAGNOSIS — I10 Essential (primary) hypertension: Secondary | ICD-10-CM

## 2023-10-10 DIAGNOSIS — F419 Anxiety disorder, unspecified: Secondary | ICD-10-CM

## 2023-10-10 DIAGNOSIS — M25561 Pain in right knee: Secondary | ICD-10-CM

## 2023-10-10 DIAGNOSIS — F32A Depression, unspecified: Secondary | ICD-10-CM

## 2023-10-10 DIAGNOSIS — Z720 Tobacco use: Secondary | ICD-10-CM

## 2023-10-10 DIAGNOSIS — G8929 Other chronic pain: Secondary | ICD-10-CM

## 2023-10-10 DIAGNOSIS — E782 Mixed hyperlipidemia: Secondary | ICD-10-CM

## 2023-10-10 DIAGNOSIS — R739 Hyperglycemia, unspecified: Secondary | ICD-10-CM | POA: Diagnosis not present

## 2023-10-10 DIAGNOSIS — R269 Unspecified abnormalities of gait and mobility: Secondary | ICD-10-CM

## 2023-10-10 DIAGNOSIS — E538 Deficiency of other specified B group vitamins: Secondary | ICD-10-CM

## 2023-10-10 LAB — HEPATIC FUNCTION PANEL
ALT: 16 U/L (ref 0–35)
AST: 12 U/L (ref 0–37)
Albumin: 4.6 g/dL (ref 3.5–5.2)
Alkaline Phosphatase: 58 U/L (ref 39–117)
Bilirubin, Direct: 0.1 mg/dL (ref 0.0–0.3)
Total Bilirubin: 0.7 mg/dL (ref 0.2–1.2)
Total Protein: 7.1 g/dL (ref 6.0–8.3)

## 2023-10-10 LAB — LIPID PANEL
Cholesterol: 206 mg/dL — ABNORMAL HIGH (ref 0–200)
HDL: 53.8 mg/dL (ref >39.00)
LDL Cholesterol: 116 mg/dL — ABNORMAL HIGH (ref 0–99)
NonHDL: 151.72
Total CHOL/HDL Ratio: 4
Triglycerides: 177 mg/dL — ABNORMAL HIGH (ref 0.0–149.0)
VLDL: 35.4 mg/dL (ref 0.0–40.0)

## 2023-10-10 MED ORDER — ROSUVASTATIN CALCIUM 20 MG PO TABS: 20.0000 mg | ORAL_TABLET | Freq: Every day | ORAL | 1 refills | Status: DC

## 2023-10-10 MED ORDER — ACETAMINOPHEN 500 MG PO TABS: 1000.0000 mg | ORAL_TABLET | Freq: Two times a day (BID) | ORAL | 0 refills | Status: AC

## 2023-10-10 NOTE — Assessment & Plan Note (Signed)
 Last orthopedic evaluation over a year ago, Pt reports knee replacement advised but delayed. Refer to orthopedics for right knee evaluation.

## 2023-10-10 NOTE — Patient Instructions (Signed)

## 2023-10-10 NOTE — Assessment & Plan Note (Signed)
 Use of AFO since CVA.  Patient needs replacement strap on AFO, referral made to [facility] for AFO maintenance

## 2023-10-10 NOTE — Assessment & Plan Note (Signed)
 BP Readings from Last 1 Encounters:  10/10/23 123/85    Well controlled, no changes to meds. Encouraged heart healthy diet such as the DASH diet and exercise as tolerated.

## 2023-10-10 NOTE — Assessment & Plan Note (Signed)
 Currently on terbinafine  therapy.  Check LFTs.

## 2023-10-10 NOTE — Progress Notes (Signed)
 Subjective:     Patient ID: Erica Mcdaniel, female    DOB: 1970/06/05, 53 y.o.   MRN: [id number]  No chief complaint on file.   HPI  Discussed the use of AI scribe software for clinical note transcription with the patient, who gave verbal consent to proceed.  Discussed the use of AI scribe software for clinical note transcription with the patient, who gave verbal consent to proceed.  Erica Mcdaniel Erica Mcdaniel is a 53 year old female who presents for TOC and FU chronic conditions. Presents with son, Erica Mcdaniel to OV. Hx CVA  Pt requesting referral to [facility] for Right AFO repair. She requires a referral to the [facility] for repair of her ankle-foot orthosis (AFO) due to a non-functional Velcro strap. The AFO was obtained after her stroke two years ago.   She is dissatisfied with her current behavioral therapist after four to five months and seeks a Therapist, nutritional. Virtual sessions have been convenient and prefered r/t sons works schedule.   She experiences joint pain  particularly in her hands. She takes Tylenol  1000 mg at night for pain. Her left knee was replaced a few years ago, and her right knee was last evaluated before her stroke.  Follows with podiatry-currently treated with terbinafine  for   HTN- lisinopril  20 mg daily  HLD-Crestor  20 mg daily, Zetia  10 mg daily  Anxiety and depression BuSpar  15 mg twice daily Lexapro  10 mg daily  Denies SI/HI  Reports taking medications as prescribed. Denies adverse Ses.  Patient denies fever, chills, SOB, CP, palpitations, dyspnea, edema, HA, vision changes, N/V/D, abdominal pain, urinary symptoms, rash, weight changes, and recent illness or hospitalizations.     History of Present Illness              Health Maintenance Due  Topic Date Due   Hepatitis C Screening  Never done   Hepatitis B Vaccines 19-59 Average Risk (1 of 3 - 19+ 3-dose series) Never done   DTaP/Tdap/Td (2 - Tdap) 09/28/2008   Pneumococcal  Vaccine: 50+ Years (1 of 1 - PCV) Never done   Zoster Vaccines- Shingrix (1 of 2) Never done   Colonoscopy  02/21/2019   Mammogram  05/10/2023   Influenza Vaccine  08/29/2023   COVID-19 Vaccine (1 - 2024-25 season) Never done    Past Medical History:  Diagnosis Date   Acute ischemic left MCA stroke (HCC) 09/25/2021   Anxiety    Arthritis    Cancer (HCC)    skin squamous cell arm   Depression    Fibromyalgia    GERD (gastroesophageal reflux disease)    Hypertension    IBS (irritable bowel syndrome)    Pneumonia    hx   Polycystic ovarian disease    Tendonitis    Tubular adenoma of colon 2018   multiple    Past Surgical History:  Procedure Laterality Date   APPENDECTOMY     BACK SURGERY     BACK SURGERY  2010   rods put in   IR ANGIO INTRA EXTRACRAN SEL COM CAROTID INNOMINATE UNI R MOD SED  09/25/2021   IR CT HEAD LTD  09/25/2021   IR CT HEAD LTD  09/25/2021   IR INTRAVSC STENT CERV CAROTID W/O EMB-PROT MOD SED INC ANGIO  09/25/2021   IR PERCUTANEOUS ART THROMBECTOMY/INFUSION INTRACRANIAL INC DIAG ANGIO  09/25/2021   IR US  GUIDE VASC ACCESS LEFT  09/25/2021   IR US  GUIDE VASC ACCESS RIGHT  09/25/2021  KNEE ARTHROSCOPY Left 13   NEUROPLASTY / TRANSPOSITION MEDIAN NERVE AT CARPAL TUNNEL BILATERAL     OVARIAN CYST SURGERY  1983   RADIOLOGY WITH ANESTHESIA N/A 09/25/2021   Procedure: IR WITH ANESTHESIA;  Surgeon: Dolphus Carrion, MD;  Location: [facility] OR;  Service: Radiology;  Laterality: N/A;   ROTATOR CUFF REPAIR  2003,04   right, and elbow,left   TENDON RECONSTRUCTION Right 12/08/2012   Procedure: ELBOW TENDON RECONSTRUCTION/Right Lateral Epicondylar Debridement with Repair.;  Surgeon: Maude LELON Right, MD;  Location: [facility] OR;  Service: Orthopedics;  Laterality: Right;  Right Lateral Epicondylar Debridement with Repair.   TRIGGER FINGER RELEASE Right 08    Family History  Problem Relation Age of Onset   Lung cancer Mother        meso   Arthritis Mother    Thyroid  disease  Father    Arthritis Father    Arthritis Sister    Heart disease Brother    Arthritis Brother    Colon polyps Brother    Hyperlipidemia Brother    Diabetes Maternal Grandmother    Diabetes Maternal Grandfather    Heart attack Maternal Grandfather    Colon cancer Neg Hx    Uterine cancer Neg Hx    Stomach cancer Neg Hx    Rectal cancer Neg Hx     Social History   Socioeconomic History   Marital status: Single    Spouse name: Not on file   Number of children: 1   Years of education: Not on file   Highest education level: Not on file  Occupational History   Occupation: disability  Tobacco Use   Smoking status: Former    Current packs/day: 0.75    Average packs/day: 0.8 packs/day for 20.0 years (15.0 ttl pk-yrs)    Types: Cigarettes   Smokeless tobacco: Never   Tobacco comments:    taking wellbutrin  to help quitting  use vqpor cig now    Stopped 2023 smoking  Vaping Use   Vaping status: Never Used  Substance and Sexual Activity   Alcohol use: No   Drug use: No   Sexual activity: Not on file  Other Topics Concern   Not on file  Social History Narrative   Not on file   Social Drivers of Health   Financial Resource Strain: Low Risk  (09/23/2023)   Overall Financial Resource Strain (CARDIA)    Difficulty of Paying Living Expenses: Not hard at all  Food Insecurity: No Food Insecurity (09/23/2023)   Hunger Vital Sign    Worried About Running Out of Food in the Last Year: Never true    Ran Out of Food in the Last Year: Never true  Transportation Needs: No Transportation Needs (09/23/2023)   PRAPARE - Administrator, Civil Service (Medical): No    Lack of Transportation (Non-Medical): No  Physical Activity: Inactive (09/23/2023)   Exercise Vital Sign    Days of Exercise per Week: 0 days    Minutes of Exercise per Session: 0 min  Stress: No Stress Concern Present (09/23/2023)   [employer] of Occupational Health - Occupational Stress Questionnaire     Feeling of Stress: Not at all  Social Connections: Unknown (09/23/2023)   Social Connection and Isolation Panel    Frequency of Communication with Friends and Family: More than three times a week    Frequency of Social Gatherings with Friends and Family: Twice a week    Attends Religious Services: Never    Active Member  of Clubs or Organizations: No    Attends Banker Meetings: Never    Marital Status: Not on file  Intimate Partner Violence: Not At Risk (09/23/2023)   Humiliation, Afraid, Rape, and Kick questionnaire    Fear of Current or Ex-Partner: No    Emotionally Abused: No    Physically Abused: No    Sexually Abused: No    Outpatient Medications Prior to Visit  Medication Sig Dispense Refill   aspirin  81 MG chewable tablet Place 1 tablet (81 mg total) into feeding tube daily. 30 tablet 0   Baclofen  5 MG TABS Take 1 tablet (5 mg total) by mouth 3 (three) times daily as needed. 90 tablet 1   busPIRone  (BUSPAR ) 15 MG tablet Take 1 tablet by mouth twice daily 180 tablet 0   escitalopram  (LEXAPRO ) 10 MG tablet Take 1 tablet (10 mg total) by mouth daily. 90 tablet 1   esomeprazole  (NEXIUM ) 40 MG capsule Take 1 capsule (40 mg total) by mouth daily. 90 capsule 1   ezetimibe  (ZETIA ) 10 MG tablet Take 1 tablet (10 mg total) by mouth daily. 90 tablet 3   lisinopril  (ZESTRIL ) 20 MG tablet Take 1 tablet (20 mg total) by mouth daily. 90 tablet 1   nystatin  (MYCOSTATIN /NYSTOP ) powder APPLY  POWDER TOPICALLY TWICE DAILY 60 g 3   terbinafine  (LAMISIL ) 250 MG tablet Take 1 tablet (250 mg total) by mouth daily. 90 tablet 0   rosuvastatin  (CRESTOR ) 20 MG tablet Take 1 tablet (20 mg total) by mouth daily. 90 tablet 1   No facility-administered medications prior to visit.    Allergies  Allergen Reactions   Clarithromycin Hives and Rash    ROS See HPI    Objective:    Physical Exam Physical Exam Constitutional:      General: She is awake. +obese    Appearance: She is  well-developed.  HENT:     Head: Normocephalic.  Eyes:     Conjunctiva/sclera: Conjunctivae normal.  Cardiovascular:     Rate and Rhythm: Normal rate and regular rhythm.     Heart sounds: Normal heart sounds.  Pulmonary:     Effort: Pulmonary effort is normal.     Breath sounds: Normal breath sounds.     General: Skin is warm.  Neurological:     Mental Status: She is alert and oriented to person, place, and time.  Psychiatric:        Attention and Perception: Attention normal.        Mood and Affect: Mood normal.        Speech: slowed     Behavior: Behavior is cooperative.   BP 123/85   Pulse 92   Ht 5' 8 (1.727 m)   Wt 250 lb 9.6 oz (113.7 kg)   BMI 38.10 kg/m  Wt Readings from Last 3 Encounters:  10/10/23 250 lb 9.6 oz (113.7 kg)  09/23/23 255 lb (115.7 kg)  05/30/23 255 lb 3.2 oz (115.8 kg)       Assessment & Plan:   Problem List Items Addressed This Visit     Altered gait   Use of AFO since CVA.  Patient needs replacement strap on AFO, referral made to [facility] for AFO maintenance      Relevant Orders   Ambulatory referral for Orthotics   Anxiety and depression - Primary   Stable on current medications.  Denies SI/HI. Encourage people to seek counseling services.  Encourage patient to take part in routine and social  activities to improve health.      Chronic pain of right knee   Last orthopedic evaluation over a year ago, Pt reports knee replacement advised but delayed. Refer to orthopedics for right knee evaluation.      Relevant Orders   Ambulatory referral to Orthopedic Surgery   History of CVA (cerebrovascular accident)   Residual word finding, short term memory changes, and RLE weakness and spasticity.  Graduated from PT, speech.  Follows with neuro, cardiology prn.  Manages with asa 81 mg, statin, zetia , ace-I. Muscle relaxants for spasticity.      Relevant Medications   rosuvastatin  (CRESTOR ) 20 MG tablet   Other Relevant Orders    Ambulatory referral for Orthotics   Hyperglycemia   Tolerating statin.  Encourage heart healthy diet such as MIND or DASH diet, increase exercise, avoid trans fats, simple carbohydrates and processed foods, consider a krill or fish or flaxseed oil cap daily.        Relevant Orders   Lipid panel   Hyperlipidemia   Tolerating statin.  On Crestor  20 mg daily and Zetia  10 mg daily.  Encourage heart healthy diet such as MIND or DASH diet, increase exercise, avoid trans fats, simple carbohydrates and processed foods, consider a krill or fish or flaxseed oil cap daily.        Relevant Medications   rosuvastatin  (CRESTOR ) 20 MG tablet   Onychomycosis   Currently on terbinafine  therapy.  Check LFTs.      Relevant Orders   Hepatic function panel   Primary hypertension   BP Readings from Last 1 Encounters:  10/10/23 123/85    Well controlled, no changes to meds. Encouraged heart healthy diet such as the DASH diet and exercise as tolerated.        Relevant Medications   rosuvastatin  (CRESTOR ) 20 MG tablet   FU 6 months     I am having Amoy D. Rojero Erica Mcdaniel start on acetaminophen . I am also having her maintain her aspirin , ezetimibe , lisinopril , esomeprazole , nystatin , Baclofen , escitalopram , terbinafine , busPIRone , and rosuvastatin .  Meds ordered this encounter  Medications   rosuvastatin  (CRESTOR ) 20 MG tablet    Sig: Take 1 tablet (20 mg total) by mouth daily.    Dispense:  90 tablet    Refill:  1   acetaminophen  (TYLENOL ) 500 MG tablet    Sig: Take 2 tablets (1,000 mg total) by mouth in the morning and at bedtime.    Dispense:  30 tablet    Refill:  0    Supervising Provider:   DOMENICA BLACKBIRD A [[id number]]

## 2023-10-10 NOTE — Assessment & Plan Note (Signed)
 Tolerating statin.  On Crestor  20 mg daily and Zetia  10 mg daily.  Encourage heart healthy diet such as MIND or DASH diet, increase exercise, avoid trans fats, simple carbohydrates and processed foods, consider a krill or fish or flaxseed oil cap daily.

## 2023-10-13 ENCOUNTER — Ambulatory Visit: Payer: Self-pay | Admitting: Student

## 2023-10-13 DIAGNOSIS — E782 Mixed hyperlipidemia: Secondary | ICD-10-CM

## 2023-10-13 MED ORDER — ROSUVASTATIN CALCIUM 40 MG PO TABS: 40.0000 mg | ORAL_TABLET | Freq: Every day | ORAL | 1 refills | Status: AC

## 2023-10-16 ENCOUNTER — Ambulatory Visit: Admitting: Psychology

## 2023-10-16 DIAGNOSIS — F32A Depression, unspecified: Secondary | ICD-10-CM | POA: Diagnosis not present

## 2023-10-16 DIAGNOSIS — F419 Anxiety disorder, unspecified: Secondary | ICD-10-CM

## 2023-10-16 NOTE — Progress Notes (Signed)
 [facility] Counselor/Therapist Progress Note  Patient ID: Erica Mcdaniel, MRN: [id number]    Date: 10/16/23  Time Spent: 10:00 am - 10:38 am : 38 minutes (Phone session)   Treatment Type: Individual Therapy.  Reported Symptoms: Anxiety and Depression F41.9 F32A  Mental Status Exam: Appearance:  NA     Behavior: Appropriate  Motor: Patient uses a cane   Speech/Language:  Slow  Affect: Flat  Mood: normal  Thought process: normal  Thought content:   WNL  Sensory/Perceptual disturbances:   WNL  Orientation: oriented to person, place, and time/date  Attention: Good  Concentration: Fair  Memory: Impaired from stroke  Progress Energy of knowledge:  Fair  Insight:   Good  Judgment:  Good  Impulse Control: Good   Risk Assessment: Danger to Self:  No Self-injurious Behavior: No Danger to Others: No Duty to Warn:no Physical Aggression / Violence:No  Access to Firearms a concern: No  Gang Involvement:No   Subjective:   Erica Mcdaniel participated from home, via phone, and consented to treatment. I discussed the limitations of evaluation and management by telemedicine and the availability of in person appointments. The patient expressed understanding and agreed to proceed.  Therapist participated from home office.  Atiyah reviewed the events of the past week.   We met by phone because patient wasn't feeling well, and she has difficulty using a laptop. At the beginning of the session, patient was not able to  complete her cognitive behavioral therapy workbook activities due to feeling tired and wiped out. After talking about some other subjects that interested the patient, the patient was willing to do some cognitive workbook activities. This Clinical research associate asked patient about the PCP order for her to go to a daycare program but patient stated that she didn't want to go there, and she didn't want to talk about why she didn't want to go. Patient reported that seeing the tv  coverage of 9/11 made her feel nervous again, similar to how she felt when it first happened. Patient continued to report that a large number of things make her feel nervous and worried.  Interventions: Cognitive Behavioral Therapy  Diagnosis:  Anxiety and depression  Psychiatric Treatment: No   Treatment Plan:  Client Abilities/Strengths Erica Mcdaniel is motivated and open to learning  Support System: Family  Client Treatment Preferences OPT  Client Statement of Needs Erica Mcdaniel would like to read more than she has since the stroke.   Treatment Level Weekly  Symptoms  Erica Mcdaniel experiences symptoms of anxiety and depression.   Goals:   Target Date: 07/08/24 Frequency: Weekly  Progress: 15% Modality: individual    Therapist will provide referrals for additional resources as appropriate.  Therapist will provide psycho-education regarding Erica Mcdaniel's diagnosis and corresponding treatment approaches and interventions. Erica Mcdaniel will support the patient's ability to achieve the goals identified. will employ CBT, BA, Problem-solving, Solution Focused, Mindfulness,  coping skills, & other evidenced-based practices will be used to promote progress towards healthy functioning to help manage decrease symptoms associated with their diagnosis.   Reduce overall level, frequency, and intensity of the feelings of depression, anxiety and panic evidenced by decreased overall symptoms from 6 to 7 days/week to 0 to 1 days/week per client report for at least 3 consecutive months. Verbally express understanding of the relationship between feelings of depression anxiety and their impact on thinking patterns and behaviors. Verbalize an understanding of the role that distorted thinking plays in creating fears, excessive worry, and ruminations.  Erica Mcdaniel participated in  the creation of the treatment plan)       Erica Mcdaniel

## 2023-10-17 ENCOUNTER — Ambulatory Visit
Admission: RE | Admit: 2023-10-17 | Discharge: 2023-10-17 | Disposition: A | Discharge status: Home / Self Care | Source: Ambulatory Visit | Attending: Physician Assistant | Admitting: Physician Assistant

## 2023-10-17 DIAGNOSIS — Z1231 Encounter for screening mammogram for malignant neoplasm of breast: Secondary | ICD-10-CM

## 2023-10-20 ENCOUNTER — Ambulatory Visit (INDEPENDENT_AMBULATORY_CARE_PROVIDER_SITE_OTHER)

## 2023-10-20 ENCOUNTER — Encounter: Payer: Self-pay | Admitting: Psychology

## 2023-10-20 DIAGNOSIS — I63512 Cerebral infarction due to unspecified occlusion or stenosis of left middle cerebral artery: Secondary | ICD-10-CM | POA: Diagnosis not present

## 2023-10-20 LAB — CUP PACEART REMOTE DEVICE CHECK
Date Time Interrogation Session: 20250921232452
Implantable Pulse Generator Implant Date: 20240115

## 2023-10-21 NOTE — Progress Notes (Signed)
 Remote Loop Recorder Transmission

## 2023-10-23 ENCOUNTER — Ambulatory Visit: Admitting: Psychology

## 2023-10-26 ENCOUNTER — Encounter: Payer: Self-pay | Admitting: Psychology

## 2023-10-30 ENCOUNTER — Encounter: Admitting: Psychology

## 2023-10-30 NOTE — Progress Notes (Deleted)
 [facility] Counselor/Therapist Progress Note  Patient ID: JASKIRAT ZERTUCHE, MRN: [id number]    Date: 10/30/23  Time Spent: 10 am - 10:53 am : 53 minutes ([phone] home number)  Treatment Type: Individual Therapy.  Reported Symptoms: ***  Mental Status Exam: Appearance:  {PSY:22683}     Behavior: {PSY:21022743}  Motor: {PSY:22302}  Speech/Language:  {PSY:22685}  Affect: {PSY:22687}  Mood: {PSY:31886}  Thought process: {PSY:31888}  Thought content:   {PSY:513-443-7450}  Sensory/Perceptual disturbances:   {[phone]}  Orientation: {PSY:30297}  Attention: {PSY:22877}  Concentration: {[phone] [phone]}  Memory: {PSY:331-810-9763}  Fund of knowledge:  {[phone] [phone]}  Insight:   {PSY:([phone] 346-0367}  Judgment:  {PSY:([phone] [phone]}  Impulse Control: {[phone] 346-0367}   Risk Assessment: Danger to Self:  {PSY:22692} Self-injurious Behavior: {PSY:22692} Danger to Others: {PSY:22692} Duty to Warn:{PSY:311194} Physical Aggression / Violence:{PSY:21197} Access to Firearms a concern: {PSY:21197} Gang Involvement:{PSY:21197}  Subjective:   Erica Mcdaniel participated from {Patient Location:[address]::home}, via {ONMVIDEOORPHONE:26699}, and consented to treatment. I discussed the limitations of evaluation and management by telemedicine and the availability of in person appointments. The patient expressed understanding and agreed to proceed.  Therapist participated from {onmtherapistlocation:[address]}.  Erica Mcdaniel reviewed the events of the past week.   ***   Interventions: {PSY:806-674-9814}  Diagnosis:  No diagnosis found.  Psychiatric Treatment: {YES/NO:21197}, ***  Treatment Plan:  Client Abilities/Strengths Erica Mcdaniel ***  Support System: ***  Client Treatment Preferences ***  Client Statement of Needs Erica Mcdaniel would like to ***   Treatment Level {Frequency of sessions.:26745}  Symptoms  ***   (Status: {Symptom Status:26744}) ***   (Status: {Symptom  Status:26744})  Goals:   Erica Mcdaniel experiences symptoms of ***   Target Date: *** Frequency: {Frequency of sessions.:26745}  Progress: 0 Modality: individual    Therapist will provide referrals for additional resources as appropriate.  Therapist will provide psycho-education regarding Erica Mcdaniel's diagnosis and corresponding treatment approaches and interventions. Erica Mcdaniel will support the patient's ability to achieve the goals identified. will employ CBT, BA, Problem-solving, Solution Focused, Mindfulness,  coping skills, & other evidenced-based practices will be used to promote progress towards healthy functioning to help manage decrease symptoms associated with {his/her/their:21314} diagnosis.   Reduce overall level, frequency, and intensity of the feelings of depression, anxiety and panic evidenced by decreased *** from 6 to 7 days/week to 0 to 1 days/week per client report for at least 3 consecutive months. Verbally express understanding of the relationship between feelings of ***depression, ***anxiety and their impact on thinking patterns and behaviors. Verbalize an understanding of the role that distorted thinking plays in creating fears, excessive worry, and ruminations.  Erica Mcdaniel participated in the creation of the treatment plan)   Erica Mcdaniel Erica Mcdaniel

## 2023-11-03 NOTE — Progress Notes (Signed)
 Remote Loop Recorder Transmission

## 2023-11-04 ENCOUNTER — Ambulatory Visit: Payer: Self-pay | Admitting: Cardiovascular Disease

## 2023-11-06 ENCOUNTER — Ambulatory Visit: Admitting: Psychology

## 2023-11-07 NOTE — Progress Notes (Signed)
 This encounter was created in error - please disregard.

## 2023-11-13 ENCOUNTER — Encounter: Admitting: Psychology

## 2023-11-13 NOTE — Progress Notes (Deleted)
                Erica Mcdaniel

## 2023-11-16 NOTE — Progress Notes (Signed)
 This encounter was created in error - please disregard.

## 2023-11-20 ENCOUNTER — Other Ambulatory Visit: Payer: Self-pay

## 2023-11-20 ENCOUNTER — Ambulatory Visit

## 2023-11-20 ENCOUNTER — Encounter: Admitting: Psychology

## 2023-11-20 ENCOUNTER — Encounter

## 2023-11-20 DIAGNOSIS — I10 Essential (primary) hypertension: Secondary | ICD-10-CM

## 2023-11-20 DIAGNOSIS — I63512 Cerebral infarction due to unspecified occlusion or stenosis of left middle cerebral artery: Secondary | ICD-10-CM

## 2023-11-20 LAB — CUP PACEART REMOTE DEVICE CHECK
Date Time Interrogation Session: 20251022232139
Implantable Pulse Generator Implant Date: 20240115

## 2023-11-20 MED ORDER — LISINOPRIL 20 MG PO TABS: 20.0000 mg | ORAL_TABLET | Freq: Every day | ORAL | 1 refills | Status: AC

## 2023-11-20 NOTE — Progress Notes (Deleted)
                [facility] Progress [facility]  Patient ID: Erica Mcdaniel, MRN: [id number]    Date: 11/20/23  Time Spent: ***  {onmampm:26702} - *** {onmampm:26702} : *** Minutes  Treatment Type: Individual Therapy.  Reported Symptoms: ***  Mental Status Exam: Appearance:  {PSY:22683}     Behavior: {PSY:21022743}  Motor: {PSY:22302}  Speech/Language:  {PSY:22685}  Affect: {PSY:22687}  Mood: {PSY:31886}  Thought process: {PSY:31888}  Thought content:   {PSY:([phone]}  Sensory/Perceptual disturbances:   {PSY:([phone] 205-5781}  Orientation: {PSY:30297}  Attention: {PSY:22877}  Concentration: {PSY:5180736924}  Memory: {PSY:4634000605}  Fund of knowledge:  {PSY:5180736924}  Insight:   {PSY:5180736924}  Judgment:  {PSY:5180736924}  Impulse Control: {PSY:5180736924}   Risk Assessment: Danger to Self:  {PSY:22692} Self-injurious Behavior: {PSY:22692} Danger to Others: {PSY:22692} Duty to Warn:{PSY:311194} Physical Aggression / Violence:{PSY:21197} Access to Firearms a concern: {PSY:21197} Gang Involvement:{PSY:21197}  Subjective:   [name] participated from {Patient Location:[address]::home}, via {ONMVIDEOORPHONE:26699}, and consented to treatment. I discussed the limitations of evaluation and management by telemedicine and the availability of in person appointments. The patient expressed understanding and agreed to proceed.  Therapist participated from {onmtherapistlocation:[address]}.  Idalee reviewed the events of the past week.      Interventions: {[phone] 692-0606}  Diagnosis:  No diagnosis found.  Psychiatric Treatment: {YES/NO:21197}, ***  Treatment Plan:  Client Abilities/Strengths Elizabeth ***  Support System: ***  Client Treatment Preferences ***  Client Statement of Needs Janiqua would like to ***   Treatment Level {Frequency of sessions.:26745}  Symptoms  ***   (Status:  {Symptom Status:26744}) ***   (Status: {Symptom Status:26744})  Goals:   Raevyn experiences symptoms of ***   Target Date: *** Frequency: {Frequency of sessions.:26745}  Progress: 0 Modality: individual    Therapist will provide referrals for additional resources as appropriate.  Therapist will provide psycho-education regarding Seneca's diagnosis and corresponding treatment approaches and interventions. Jenkins HERO Nicolas will support the patient's ability to achieve the goals identified. will employ CBT, BA, Problem-solving, Solution Focused, Mindfulness,  coping skills, & other evidenced-based practices will be used to promote progress towards healthy functioning to help manage decrease symptoms associated with {his/her/their:21314} diagnosis.   Reduce overall level, frequency, and intensity of the feelings of depression, anxiety and panic evidenced by decreased *** from 6 to 7 days/week to 0 to 1 days/week per client report for at least 3 consecutive months. Verbally express understanding of the relationship between feelings of ***depression, ***anxiety and their impact on thinking patterns and behaviors. Verbalize an understanding of the role that distorted thinking plays in creating fears, excessive worry, and ruminations.  [name] participated in the creation of the treatment plan)   Jenkins HERO Nicolas

## 2023-11-20 NOTE — Progress Notes (Signed)
 This encounter was created in error - please disregard.

## 2023-11-21 NOTE — Progress Notes (Signed)
 Remote Loop Recorder Transmission

## 2023-11-24 ENCOUNTER — Other Ambulatory Visit: Payer: Self-pay | Admitting: *Deleted

## 2023-11-24 DIAGNOSIS — K219 Gastro-esophageal reflux disease without esophagitis: Secondary | ICD-10-CM

## 2023-11-24 DIAGNOSIS — F419 Anxiety disorder, unspecified: Secondary | ICD-10-CM

## 2023-11-24 MED ORDER — BUSPIRONE HCL 15 MG PO TABS: 15.0000 mg | ORAL_TABLET | Freq: Two times a day (BID) | ORAL | 0 refills | Status: DC

## 2023-11-24 MED ORDER — ESOMEPRAZOLE MAGNESIUM 40 MG PO CPDR: 40.0000 mg | DELAYED_RELEASE_CAPSULE | Freq: Every day | ORAL | 1 refills | Status: AC

## 2023-11-27 ENCOUNTER — Ambulatory Visit: Admitting: Psychology

## 2023-12-03 ENCOUNTER — Ambulatory Visit: Payer: Self-pay | Admitting: Cardiovascular Disease

## 2023-12-04 ENCOUNTER — Ambulatory Visit: Admitting: Psychology

## 2023-12-05 ENCOUNTER — Ambulatory Visit: Admitting: Physician Assistant

## 2023-12-18 ENCOUNTER — Ambulatory Visit: Admitting: Psychology

## 2023-12-21 ENCOUNTER — Ambulatory Visit

## 2023-12-22 ENCOUNTER — Encounter

## 2023-12-23 ENCOUNTER — Ambulatory Visit

## 2023-12-23 DIAGNOSIS — I63512 Cerebral infarction due to unspecified occlusion or stenosis of left middle cerebral artery: Secondary | ICD-10-CM | POA: Diagnosis not present

## 2023-12-23 LAB — CUP PACEART REMOTE DEVICE CHECK
Date Time Interrogation Session: 20251124232456
Implantable Pulse Generator Implant Date: 20240115

## 2023-12-24 NOTE — Progress Notes (Signed)
 Remote Loop Recorder Transmission

## 2023-12-30 ENCOUNTER — Ambulatory Visit: Payer: Self-pay | Admitting: Cardiovascular Disease

## 2024-01-01 ENCOUNTER — Ambulatory Visit: Admitting: Psychology

## 2024-01-02 ENCOUNTER — Ambulatory Visit: Admitting: Podiatry

## 2024-01-02 DIAGNOSIS — B351 Tinea unguium: Secondary | ICD-10-CM | POA: Diagnosis not present

## 2024-01-02 NOTE — Progress Notes (Signed)
 Subjective:  Patient ID: Erica Mcdaniel, female    DOB: 10-14-1970,  MRN: [id number]  [name] presents to clinic today for fungal nail check of the right hallux nail.  She had taken the oral terbinafine  several months ago.  She feels that there is continued improvement with the new nail growth.  She is not having any pain at this time.  Her family member did just recently trim her nails.  PCP is Wheeler Harlene CROME, NP.  Past Medical History:  Diagnosis Date   Acute ischemic left MCA stroke (HCC) 09/25/2021   Anxiety    Arthritis    Cancer (HCC)    skin squamous cell arm   Depression    Fibromyalgia    GERD (gastroesophageal reflux disease)    Hypertension    IBS (irritable bowel syndrome)    Pneumonia    hx   Polycystic ovarian disease    Tendonitis    Tubular adenoma of colon 2018   multiple   Past Surgical History:  Procedure Laterality Date   APPENDECTOMY     BACK SURGERY     BACK SURGERY  2010   rods put in   IR ANGIO INTRA EXTRACRAN SEL COM CAROTID INNOMINATE UNI R MOD SED  09/25/2021   IR CT HEAD LTD  09/25/2021   IR CT HEAD LTD  09/25/2021   IR INTRAVSC STENT CERV CAROTID W/O EMB-PROT MOD SED INC ANGIO  09/25/2021   IR PERCUTANEOUS ART THROMBECTOMY/INFUSION INTRACRANIAL INC DIAG ANGIO  09/25/2021   IR US  GUIDE VASC ACCESS LEFT  09/25/2021   IR US  GUIDE VASC ACCESS RIGHT  09/25/2021   KNEE ARTHROSCOPY Left 13   NEUROPLASTY / TRANSPOSITION MEDIAN NERVE AT CARPAL TUNNEL BILATERAL     OVARIAN CYST SURGERY  1983   RADIOLOGY WITH ANESTHESIA N/A 09/25/2021   Procedure: IR WITH ANESTHESIA;  Surgeon: Dolphus Carrion, MD;  Location: [facility] OR;  Service: Radiology;  Laterality: N/A;   ROTATOR CUFF REPAIR  2003,04   right, and elbow,left   TENDON RECONSTRUCTION Right 12/08/2012   Procedure: ELBOW TENDON RECONSTRUCTION/Right Lateral Epicondylar Debridement with Repair.;  Surgeon: Maude LELON Right, MD;  Location: [facility] OR;  Service: Orthopedics;  Laterality: Right;   Right Lateral Epicondylar Debridement with Repair.   TRIGGER FINGER RELEASE Right 08   Allergies  Allergen Reactions   Clarithromycin Hives and Rash    Review of Systems: Negative except as noted in the HPI.  Objective:  Vascular Examination: Capillary refill time is 3-5 seconds to toes bilateral. Palpable pedal pulses b/l LE. Digital hair present b/l.    Dermatological Examination: Pedal skin with normal turgor, texture and tone b/l. No open wounds. No interdigital macerations b/l.  The right hallux toenail is 3mm thick, discolored, dystrophic with subungual debris. There is pain with compression of the nail plates.  There is some proximal clearing noted     Latest Ref Rng & Units 05/30/2023    2:39 PM  Hemoglobin A1C  Hemoglobin-A1c <5.7 % 5.7    Assessment/Plan: 1. Fungal nail infection    The mycotic right hallux toenail was sharply debrided with sterile nail nippers and a power debriding burr to decrease bulk/thickness and length.    Patient to continue with current treatment plan for the fungal nails as there is improvement noted.  She will call if she needs a 30-day pulsed dose of the oral terbinafine  send and if she feels that there is some recurrence of the fungus.  Follow-up  as needed   Awanda CHARM Imperial, DPM, FACFAS [facility]     [address] [place], [place] [address]                Office [phone]  Fax [phone]

## 2024-01-08 ENCOUNTER — Ambulatory Visit: Admitting: Psychology

## 2024-01-15 ENCOUNTER — Ambulatory Visit: Admitting: Psychology

## 2024-01-22 ENCOUNTER — Encounter

## 2024-01-23 ENCOUNTER — Ambulatory Visit

## 2024-01-23 DIAGNOSIS — I639 Cerebral infarction, unspecified: Secondary | ICD-10-CM | POA: Diagnosis not present

## 2024-01-23 LAB — CUP PACEART REMOTE DEVICE CHECK
Date Time Interrogation Session: 20251225232124
Implantable Pulse Generator Implant Date: 20240115

## 2024-01-23 NOTE — Progress Notes (Signed)
 Remote Loop Recorder Transmission

## 2024-01-28 ENCOUNTER — Ambulatory Visit: Payer: Self-pay | Admitting: Cardiovascular Disease

## 2024-02-06 ENCOUNTER — Other Ambulatory Visit: Payer: Self-pay

## 2024-02-06 DIAGNOSIS — Z8673 Personal history of transient ischemic attack (TIA), and cerebral infarction without residual deficits: Secondary | ICD-10-CM

## 2024-02-06 MED ORDER — EZETIMIBE 10 MG PO TABS: 10.0000 mg | ORAL_TABLET | Freq: Every day | ORAL | 0 refills | Status: AC

## 2024-02-21 ENCOUNTER — Other Ambulatory Visit: Payer: Self-pay | Admitting: Student

## 2024-02-21 DIAGNOSIS — F419 Anxiety disorder, unspecified: Secondary | ICD-10-CM

## 2024-02-23 ENCOUNTER — Ambulatory Visit: Attending: Cardiovascular Disease

## 2024-02-23 DIAGNOSIS — I639 Cerebral infarction, unspecified: Secondary | ICD-10-CM | POA: Diagnosis not present

## 2024-02-23 LAB — CUP PACEART REMOTE DEVICE CHECK
Date Time Interrogation Session: 20260125232546
Implantable Pulse Generator Implant Date: 20240115

## 2024-02-24 ENCOUNTER — Ambulatory Visit: Payer: Self-pay | Admitting: Cardiovascular Disease

## 2024-03-01 NOTE — Progress Notes (Signed)
 Remote Loop Recorder Transmission

## 2024-03-02 ENCOUNTER — Other Ambulatory Visit: Payer: Self-pay | Admitting: Podiatry

## 2024-03-25 ENCOUNTER — Ambulatory Visit: Payer: Self-pay

## 2024-10-05 ENCOUNTER — Ambulatory Visit
